# Patient Record
Sex: Male | Born: 1950 | Race: White | Hispanic: No | Marital: Single
Health system: Midwestern US, Community
[De-identification: ages and names within clinical notes are randomized; demographics above are authoritative.]

## PROBLEM LIST (undated history)

## (undated) DIAGNOSIS — K4090 Unilateral inguinal hernia, without obstruction or gangrene, not specified as recurrent: Secondary | ICD-10-CM

## (undated) DIAGNOSIS — J189 Pneumonia, unspecified organism: Secondary | ICD-10-CM

## (undated) DIAGNOSIS — C61 Malignant neoplasm of prostate: Secondary | ICD-10-CM

## (undated) DIAGNOSIS — C3431 Malignant neoplasm of lower lobe, right bronchus or lung: Secondary | ICD-10-CM

## (undated) DIAGNOSIS — I82592 Chronic embolism and thrombosis of other specified deep vein of left lower extremity: Secondary | ICD-10-CM

## (undated) DIAGNOSIS — C7951 Secondary malignant neoplasm of bone: Secondary | ICD-10-CM

## (undated) DIAGNOSIS — Z599 Problem related to housing and economic circumstances, unspecified: Secondary | ICD-10-CM

## (undated) DIAGNOSIS — C801 Malignant (primary) neoplasm, unspecified: Secondary | ICD-10-CM

## (undated) DIAGNOSIS — R160 Hepatomegaly, not elsewhere classified: Secondary | ICD-10-CM

## (undated) DIAGNOSIS — N179 Acute kidney failure, unspecified: Secondary | ICD-10-CM

---

## 1992-08-18 HISTORY — PX: ANKLE SURGERY: SHX546

## 2002-01-24 ENCOUNTER — Encounter: Payer: Self-pay | Admitting: Emergency Medicine

## 2002-01-24 ENCOUNTER — Emergency Department (HOSPITAL_COMMUNITY): Admission: EM | Admit: 2002-01-24 | Discharge: 2002-01-24 | Payer: Self-pay | Admitting: Emergency Medicine

## 2004-09-21 ENCOUNTER — Emergency Department (HOSPITAL_COMMUNITY): Admission: EM | Admit: 2004-09-21 | Discharge: 2004-09-21 | Payer: Self-pay | Admitting: Emergency Medicine

## 2004-09-30 ENCOUNTER — Emergency Department (HOSPITAL_COMMUNITY): Admission: EM | Admit: 2004-09-30 | Discharge: 2004-09-30 | Payer: Self-pay | Admitting: Family Medicine

## 2013-11-20 DIAGNOSIS — F17200 Nicotine dependence, unspecified, uncomplicated: Secondary | ICD-10-CM | POA: Insufficient documentation

## 2013-11-20 DIAGNOSIS — K4090 Unilateral inguinal hernia, without obstruction or gangrene, not specified as recurrent: Secondary | ICD-10-CM | POA: Insufficient documentation

## 2013-11-21 ENCOUNTER — Encounter (HOSPITAL_COMMUNITY): Payer: Self-pay | Admitting: Emergency Medicine

## 2013-11-21 ENCOUNTER — Emergency Department (HOSPITAL_COMMUNITY)
Admission: EM | Admit: 2013-11-21 | Discharge: 2013-11-21 | Disposition: A | Discharge status: Home / Self Care | Payer: Medicare Other | Attending: Emergency Medicine | Admitting: Emergency Medicine

## 2013-11-21 DIAGNOSIS — K4090 Unilateral inguinal hernia, without obstruction or gangrene, not specified as recurrent: Secondary | ICD-10-CM

## 2013-11-21 LAB — URINALYSIS, ROUTINE W REFLEX MICROSCOPIC
Bilirubin Urine: NEGATIVE
Glucose, UA: NEGATIVE mg/dL
Hgb urine dipstick: NEGATIVE
Ketones, ur: NEGATIVE mg/dL
Leukocytes, UA: NEGATIVE
Nitrite: NEGATIVE
Protein, ur: NEGATIVE mg/dL
Specific Gravity, Urine: 1.019 (ref 1.005–1.030)
Urobilinogen, UA: 0.2 mg/dL (ref 0.0–1.0)
pH: 5 (ref 5.0–8.0)

## 2013-11-21 NOTE — Discharge Instructions (Signed)
Call and make an appointment to followup with the surgeon this week. Return immediately for increased pain, persistent vomiting or any concerns.   Inguinal Hernia, Adult Muscles help keep everything in the body in its proper place. But if a weak spot in the muscles develops, something can poke through. That is called a hernia. When this happens in the lower part of the belly (abdomen), it is called an inguinal hernia. (It takes its name from a part of the body in this region called the inguinal canal.) A weak spot in the wall of muscles lets some fat or part of the small intestine bulge through. An inguinal hernia can develop at any age. Men get them more often than women. CAUSES  In adults, an inguinal hernia develops over time.  It can be triggered by:  Suddenly straining the muscles of the lower abdomen.  Lifting heavy objects.  Straining to have a bowel movement. Difficult bowel movements (constipation) can lead to this.  Constant coughing. This may be caused by smoking or lung disease.  Being overweight.  Being pregnant.  Working at a job that requires long periods of standing or heavy lifting.  Having had an inguinal hernia before. One type can be an emergency situation. It is called a strangulated inguinal hernia. It develops if part of the small intestine slips through the weak spot and cannot get back into the abdomen. The blood supply can be cut off. If that happens, part of the intestine may die. This situation requires emergency surgery. SYMPTOMS  Often, a small inguinal hernia has no symptoms. It is found when a healthcare provider does a physical exam. Larger hernias usually have symptoms.   In adults, symptoms may include:  A lump in the groin. This is easier to see when the person is standing. It might disappear when lying down.  In men, a lump in the scrotum.  Pain or burning in the groin. This occurs especially when lifting, straining or coughing.  A dull ache  or feeling of pressure in the groin.  Signs of a strangulated hernia can include:  A bulge in the groin that becomes very painful and tender to the touch.  A bulge that turns red or purple.  Fever, nausea and vomiting.  Inability to have a bowel movement or to pass gas. DIAGNOSIS  To decide if you have an inguinal hernia, a healthcare provider will probably do a physical examination.  This will include asking questions about any symptoms you have noticed.  The healthcare provider might feel the groin area and ask you to cough. If an inguinal hernia is felt, the healthcare provider may try to slide it back into the abdomen.  Usually no other tests are needed. TREATMENT  Treatments can vary. The size of the hernia makes a difference. Options include:  Watchful waiting. This is often suggested if the hernia is small and you have had no symptoms.  No medical procedure will be done unless symptoms develop.  You will need to watch closely for symptoms. If any occur, contact your healthcare provider right away.  Surgery. This is used if the hernia is larger or you have symptoms.  Open surgery. This is usually an outpatient procedure (you will not stay overnight in a hospital). An cut (incision) is made through the skin in the groin. The hernia is put back inside the abdomen. The weak area in the muscles is then repaired by herniorrhaphy or hernioplasty. Herniorrhaphy: in this type of surgery, the  weak muscles are sewn back together. Hernioplasty: a patch or mesh is used to close the weak area in the abdominal wall.  Laparoscopy. In this procedure, a surgeon makes small incisions. A thin tube with a tiny video camera (called a laparoscope) is put into the abdomen. The surgeon repairs the hernia with mesh by looking with the video camera and using two long instruments. HOME CARE INSTRUCTIONS   After surgery to repair an inguinal hernia:  You will need to take pain medicine prescribed by  your healthcare provider. Follow all directions carefully.  You will need to take care of the wound from the incision.  Your activity will be restricted for awhile. This will probably include no heavy lifting for several weeks. You also should not do anything too active for a few weeks. When you can return to work will depend on the type of job that you have.  During "watchful waiting" periods, you should:  Maintain a healthy weight.  Eat a diet high in fiber (fruits, vegetables and whole grains).  Drink plenty of fluids to avoid constipation. This means drinking enough water and other liquids to keep your urine clear or pale yellow.  Do not lift heavy objects.  Do not stand for long periods of time.  Quit smoking. This should keep you from developing a frequent cough. SEEK MEDICAL CARE IF:   A bulge develops in your groin area.  You feel pain, a burning sensation or pressure in the groin. This might be worse if you are lifting or straining.  You develop a fever of more than 100.5 F (38.1 C). SEEK IMMEDIATE MEDICAL CARE IF:   Pain in the groin increases suddenly.  A bulge in the groin gets bigger suddenly and does not go down.  For men, there is sudden pain in the scrotum. Or, the size of the scrotum increases.  A bulge in the groin area becomes red or purple and is painful to touch.  You have nausea or vomiting that does not go away.  You feel your heart beating much faster than normal.  You cannot have a bowel movement or pass gas.  You develop a fever of more than 102.0 F (38.9 C). Document Released: 12/21/2008 Document Revised: 10/27/2011 Document Reviewed: 12/21/2008 [facility] Patient Information 2014 [place], [place].

## 2013-11-21 NOTE — ED Provider Notes (Signed)
CSN: [id number]     Arrival date & time 11/20/13  2340 History   First MD Initiated Contact with Patient 11/21/13 [phone]     Chief Complaint  Patient presents with  . Testicle Pain     (Consider location/radiation/quality/duration/timing/severity/associated sxs/prior Treatment) HPI Patient presents with scrotal swelling for the past week. The swelling is intermittent and worse with exertion. He states he sometimes able to reduce the swelling with pressure. He states is uncomfortable but denies any testicular pain. He's had no urinary symptoms including no dysuria no hematuria and no frequency. He's had no abdominal pain and no nausea, vomiting or diarrhea. He denies any obstipation or constipation. He's had no fevers or chills.  History reviewed. No pertinent past medical history. History reviewed. No pertinent past surgical history. No family history on file. History  Substance Use Topics  . Smoking status: Current Every Day Smoker  . Smokeless tobacco: Not on file  . Alcohol Use: Yes    Review of Systems  Constitutional: Negative for fever and chills.  Respiratory: Negative for shortness of breath.   Cardiovascular: Negative for chest pain.  Gastrointestinal: Negative for nausea, vomiting, abdominal pain, diarrhea and constipation.  Genitourinary: Positive for scrotal swelling. Negative for dysuria, frequency, hematuria, discharge, penile pain and testicular pain.  Musculoskeletal: Negative for back pain.  Skin: Negative for rash and wound.  Neurological: Negative for dizziness, weakness, light-headedness and numbness.  All other systems reviewed and are negative.      Allergies  Review of patient's allergies indicates not on file.  Home Medications   Current Outpatient Rx  Name  Route  Sig  Dispense  Refill  . naproxen sodium (ANAPROX) 220 MG tablet   Oral   Take 220 mg by mouth daily as needed (pain).          BP 112/71  Pulse 84  Temp(Src) 97.8 F (36.6 C)  (Oral)  Resp 16  Ht 5\' 7"  (1.702 m)  Wt 153 lb 7 oz (69.599 kg)  BMI 24.03 kg/m2  SpO2 98% Physical Exam  Nursing note and vitals reviewed. Constitutional: He is oriented to person, place, and time. He appears well-developed and well-nourished. No distress.  Patient is very comfortable appearing resting in the bed.  HENT:  Head: Normocephalic and atraumatic.  Mouth/Throat: Oropharynx is clear and moist.  Eyes: EOM are normal. Pupils are equal, round, and reactive to light.  Neck: Normal range of motion. Neck supple.  Cardiovascular: Normal rate and regular rhythm.   Pulmonary/Chest: Effort normal and breath sounds normal. No respiratory distress. He has no wheezes. He has no rales.  Abdominal: Soft. Bowel sounds are normal. He exhibits no distension and no mass. There is no tenderness. There is no rebound and no guarding.  Genitourinary:  Large left-sided scrotal mass consistent with left inguinal hernia. Patient has no tenderness. There is no erythema or induration. Right testicle is normal but the left testicle is obscured by hernia mass. Hernia reduces with some effort.   Musculoskeletal: Normal range of motion. He exhibits no edema and no tenderness.  Neurological: He is alert and oriented to person, place, and time.  Skin: Skin is warm and dry. No rash noted. No erythema.  Psychiatric: He has a normal mood and affect. His behavior is normal.    ED Course  Procedures (including critical care time) Labs Review Labs Reviewed  URINALYSIS, ROUTINE W REFLEX MICROSCOPIC   Imaging Review No results found.   EKG Interpretation None  MDM   Final diagnoses:  None   Discussed with Dr. Janee Mornhompson. Agrees with plan to followup as an outpatient in the office. Return precautions given.     Loren Raceravid Kadejah Sandiford, MD 11/21/13 [phone]

## 2013-11-21 NOTE — ED Notes (Signed)
Pt c/o swelling in his testicles x's 3 days with pain.  Pt denies any injury or discharge

## 2013-12-11 ENCOUNTER — Encounter (HOSPITAL_COMMUNITY): Payer: Self-pay | Admitting: Emergency Medicine

## 2013-12-11 ENCOUNTER — Emergency Department (HOSPITAL_COMMUNITY)
Admission: EM | Admit: 2013-12-11 | Discharge: 2013-12-11 | Disposition: A | Discharge status: Home / Self Care | Payer: Medicare Other | Attending: Emergency Medicine | Admitting: Emergency Medicine

## 2013-12-11 DIAGNOSIS — F17200 Nicotine dependence, unspecified, uncomplicated: Secondary | ICD-10-CM | POA: Insufficient documentation

## 2013-12-11 DIAGNOSIS — L03211 Cellulitis of face: Principal | ICD-10-CM

## 2013-12-11 DIAGNOSIS — L0201 Cutaneous abscess of face: Secondary | ICD-10-CM | POA: Insufficient documentation

## 2013-12-11 MED ORDER — HYDROCODONE-ACETAMINOPHEN 5-325 MG PO TABS: 1.0000 | ORAL_TABLET | ORAL | Status: DC | PRN

## 2013-12-11 MED ORDER — CLINDAMYCIN HCL 150 MG PO CAPS: 300.0000 mg | ORAL_CAPSULE | Freq: Three times a day (TID) | ORAL | Status: DC

## 2013-12-11 MED ORDER — HYDROCODONE-ACETAMINOPHEN 5-325 MG PO TABS
1.0000 | ORAL_TABLET | Freq: Once | ORAL | Status: AC
  Administered 2013-12-11: 1 via ORAL
  Filled 2013-12-11: qty 1

## 2013-12-11 NOTE — Discharge Instructions (Signed)
Take Vicodin for severe pain - Please be careful with this medication.  It can cause drowsiness.  Use caution while driving, operating machinery, drinking alcohol, or any other activities that may impair your physical or mental abilities.   Take Ibuprofen for mild-moderate pain - 600-800 mg every 6-8 hours  Take full dose of antibiotic  Follow-up with dentist and primary doctor  Return to the emergency department if you develop any changing/worsening condition, fever, increased drainage, increased redness/swelling, or any other concerns (please read additional information regarding your condition below)     Abscess An abscess is an infected area that contains a collection of pus and debris.It can occur in almost any part of the body. An abscess is also known as a furuncle or boil. CAUSES  An abscess occurs when tissue gets infected. This can occur from blockage of oil or sweat glands, infection of hair follicles, or a minor injury to the skin. As the body tries to fight the infection, pus collects in the area and creates pressure under the skin. This pressure causes pain. People with weakened immune systems have difficulty fighting infections and get certain abscesses more often.  SYMPTOMS Usually an abscess develops on the skin and becomes a painful mass that is red, warm, and tender. If the abscess forms under the skin, you may feel a moveable soft area under the skin. Some abscesses break open (rupture) on their own, but most will continue to get worse without care. The infection can spread deeper into the body and eventually into the bloodstream, causing you to feel ill.  DIAGNOSIS  Your caregiver will take your medical history and perform a physical exam. A sample of fluid may also be taken from the abscess to determine what is causing your infection. TREATMENT  Your caregiver may prescribe antibiotic medicines to fight the infection. However, taking antibiotics alone usually does not cure an  abscess. Your caregiver may need to make a small cut (incision) in the abscess to drain the pus. In some cases, gauze is packed into the abscess to reduce pain and to continue draining the area. HOME CARE INSTRUCTIONS   Only take over-the-counter or prescription medicines for pain, discomfort, or fever as directed by your caregiver.  If you were prescribed antibiotics, take them as directed. Finish them even if you start to feel better.  If gauze is used, follow your caregiver's directions for changing the gauze.  To avoid spreading the infection:  Keep your draining abscess covered with a bandage.  Wash your hands well.  Do not share personal care items, towels, or whirlpools with others.  Avoid skin contact with others.  Keep your skin and clothes clean around the abscess.  Keep all follow-up appointments as directed by your caregiver. SEEK MEDICAL CARE IF:   You have increased pain, swelling, redness, fluid drainage, or bleeding.  You have muscle aches, chills, or a general ill feeling.  You have a fever. MAKE SURE YOU:   Understand these instructions.  Will watch your condition.  Will get help right away if you are not doing well or get worse. Document Released: 05/14/2005 Document Revised: 02/03/2012 Document Reviewed: 10/17/2011 [facility] Patient Information 2014 [place], [place].  Dental Caries  Dental caries (also called tooth decay) is the most common oral disease. It can occur at any age, but is more common in children and young adults.  HOW DENTAL CARIES DEVELOPS  The process of decay begins when bacteria and foods (particularly sugars and starches) combine in  your mouth to produce plaque. Plaque is a substance that sticks to the hard, outer surface of a tooth (enamel). The bacteria in plaque produce acids that attack enamel. These acids may also attack the root surface of a tooth (cementum) if it is exposed. Repeated attacks dissolve these surfaces and create holes  in the tooth (cavities). If left untreated, the acids destroy the other layers of the tooth.  RISK FACTORS  Frequent sipping of sugary beverages.   Frequent snacking on sugary and starchy foods, especially those that easily get stuck in the teeth.   Poor oral hygiene.   Dry mouth.   Substance abuse such as methamphetamine abuse.   Broken or poor-fitting dental restorations.   Eating disorders.   Gastroesophageal reflux disease (GERD).   Certain radiation treatments to the head and neck. SYMPTOMS In the early stages of dental caries, symptoms are seldom present. Sometimes white, chalky areas may be seen on the enamel or other tooth layers. In later stages, symptoms may include:  Pits and holes on the enamel.  Toothache after sweet, hot, or cold foods or drinks are consumed.  Pain around the tooth.  Swelling around the tooth. DIAGNOSIS  Most of the time, dental caries is detected during a regular dental checkup. A diagnosis is made after a thorough medical and dental history is taken and the surfaces of your teeth are checked for signs of dental caries. Sometimes special instruments, such as lasers, are used to check for dental caries. Dental X-ray exams may be taken so that areas not visible to the eye (such as between the contact areas of the teeth) can be checked for cavities.  TREATMENT  If dental caries is in its early stages, it may be reversed with a fluoride treatment or an application of a remineralizing agent at the dental office. Thorough brushing and flossing at home is needed to aid these treatments. If it is in its later stages, treatment depends on the location and extent of tooth destruction:   If a small area of the tooth has been destroyed, the destroyed area will be removed and cavities will be filled with a material such as gold, silver amalgam, or composite resin.   If a large area of the tooth has been destroyed, the destroyed area will be removed and  a cap (crown) will be fitted over the remaining tooth structure.   If the center part of the tooth (pulp) is affected, a procedure called a root canal will be needed before a filling or crown can be placed.   If most of the tooth has been destroyed, the tooth may need to be pulled (extracted). HOME CARE INSTRUCTIONS You can prevent, stop, or reverse dental caries at home by practicing good oral hygiene. Good oral hygiene includes:  Thoroughly cleaning your teeth at least twice a day with a toothbrush and dental floss.   Using a fluoride toothpaste. A fluoride mouth rinse may also be used if recommended by your dentist or health care provider.   Restricting the amount of sugary and starchy foods and sugary liquids you consume.   Avoiding frequent snacking on these foods and sipping of these liquids.   Keeping regular visits with a dentist for checkups and cleanings. PREVENTION   Practice good oral hygiene.  Consider a dental sealant. A dental sealant is a coating material that is applied by your dentist to the pits and grooves of teeth. The sealant prevents food from being trapped in them. It may protect  the teeth for several years.  Ask about fluoride supplements if you live in a community without fluorinated water or with water that has a low fluoride content. Use fluoride supplements as directed by your dentist or health care provider.  Allow fluoride varnish applications to teeth if directed by your dentist or health care provider. Document Released: 04/26/2002 Document Revised: 04/06/2013 Document Reviewed: 08/06/2012 [facility] Patient Information 2014 [place]Care, [place]LL[place].   Emergency Department Resource Guide 1) Find a Doctor and Pay Out of Pocket Although you won't have to find out who is covered by your insurance plan, it is a good idea to ask around and get recommendations. You will then need to call the office and see if the doctor you have chosen will accept you as a new  patient and what types of options they offer for patients who are self-pay. Some doctors offer discounts or will set up payment plans for their patients who do not have insurance, but you will need to ask so you aren't surprised when you get to your appointment.  2) Contact Your Local Health Department Not all health departments have doctors that can see patients for sick visits, but many do, so it is worth a call to see if yours does. If you don't know where your local health department is, you can check in your phone book. The CDC also has a tool to help you locate your state's health department, and many state websites also have listings of all of their local health departments.  3) Find a Walk-in Clinic If your illness is not likely to be very severe or complicated, you may want to try a walk in clinic. These are popping up all over the country in pharmacies, drugstores, and shopping centers. They're usually staffed by nurse practitioners or physician assistants that have been trained to treat common illnesses and complaints. They're usually fairly quick and inexpensive. However, if you have serious medical issues or chronic medical problems, these are probably not your best option.  No Primary Care Doctor: - Call Health Connect at  [phone] - they can help you locate a primary care doctor that  accepts your insurance, provides certain services, etc. - Physician Referral Service- [phone]  Chronic Pain Problems: Organization         Address  Phone   Notes  [facility]  [phone] Patients need to be referred by their primary care doctor.   Medication Assistance: Organization         Address  Phone   Notes  [facility] [address] [place], [place] [address] [phone] --Must be a resident of [facility] [place] [facility] -- Must have NO insurance coverage whatsoever (no Medicaid/ Medicare, etc.) -- The pt. MUST have a primary  care doctor that directs their care regularly and follows them in the community   MedAssist  [phone]   [employer]  [phone]    Agencies that provide inexpensive medical care: Organization         Address  Phone   Notes  [facility]  [phone]   [facility]    [phone]   [facility] [address] [place], [place] [address] [phone]   [facility] [address], [place] [phone]   Planned Parenthood    [phone]   [facility]    [phone]) [phone]   [facility]  [address], [place] Phone:  [phone], Fax:  [phone] Hours of Operation:  9 am - 6 pm, M-F.  Also accepts Medicaid/Medicare and self-pay.  [facility]  [address], [place] Phone: [phone], Fax: [phone]. Hours of Operation:  8:30 am - 5:30 pm, M-F.  Also accepts Medicaid and self-pay.  [facility] [place] [address] [place] Phone: [phone]   [facility] [address] Natasha Bence [place], [place] [phone], Ext. [phone] Mondays & Thursdays: 7-9 AM.  First 15 patients are seen on a first come, first serve basis.    Medicaid-accepting [facility], [facility] Providers:  Organization         Address  Phone   Notes  [facility] [address], [place] [phone] Also accepts self-pay patients.  [facility] [address] Laurell Josephs [place], [place]  [phone]   [facility] [address], [place] [phone]   [facility] [address] [place] [phone]   Renaye Rakers [address], [place]   [phone] Only accepts [place] Access [place] patients after they have their name applied to their card.   Self-Pay (no insurance) in [facility]:  Organization         Address  Phone   Notes  Sickle Cell Patients, [facility] [address] [place], [place] [phone]   [facility] [address] [place], [place] [phone]   [facility] [place]  [address],  [phone]   [facility]/Dr. Osei-Bonsu  [address] [place] or [address], [place] [phone] Phone number for both [place] and [place] locations is the same.  [facility] [address] [place] [address] [place] [phone]   [facility] [address], [place] or [address] [phone]   [facility] [address], [place] [phone], phone; [phone], fax Sees patients 1st and 3rd Saturday of every month.  Must not qualify for public or private insurance (i.e. Medicaid, Medicare, [place] Health Choice, Veterans' Benefits)  Household income should be no more than 200% of the poverty level The clinic cannot treat you if you are pregnant or think you are pregnant  Sexually transmitted diseases are not treated at the clinic.    Dental Care: Organization         Address  Phone  Notes  [facility] Department of [facility] [address] [place], [place] [phone] Accepts children up to age 65 who are enrolled in [place] or [place] Health Choice; pregnant women with a Medicaid card; and children who have applied for Medicaid or [place] Health Choice, but were declined, whose parents can pay a reduced fee at time of service.  [facility] Department of [facility]  [address], [place] [phone] Accepts children up to age 29 who are enrolled in [place] or [place] Health Choice; pregnant women with a Medicaid card; and children who have applied for Medicaid or [place] Health Choice, but were declined, whose parents can  pay a reduced fee at time of service.  [facility]  [address]  Lynne [place] [phone] Patients are seen by appointment only. Walk-ins are not accepted. [facility] will see patients 19 years of age and older. Monday - Tuesday (8am-5pm) Most Wednesdays (8:30-5pm) $30 per visit, cash only  [facility] Adult Dental Access PROGRAM  [address], [facility] [phone] Patients are seen by appointment only. Walk-ins are not accepted. [facility] will see patients 17 years of age and older. One Wednesday Evening (Monthly: Volunteer Based).  $30 per visit, cash only  [employer]  [phone] for adults; Children under age 34, call Graduate Pediatric Dentistry at [phone]. Children aged 79-14, please call [phone] to request a pediatric application.  Dental services are provided in all areas of dental care including fillings, crowns and bridges, complete and partial dentures, implants, gum treatment, root canals, and extractions. Preventive care is also provided. Treatment is provided to both adults and children. Patients are selected via a lottery and there is often a waiting list.   [facility] [address] [place]  [phone] [email or web address]   [facility] [address] [place], [place] [phone], Ext. [phone] Second and Fourth Thursday of each month, opens at 6:30 AM; Clinic ends at 9 AM.  Patients are seen on a first-come first-served basis, and a limited number are seen during each clinic.   [facility]  [address] Ether Griffins [employer], [place] [phone]   Eligibility Requirements You must have lived in [place], [place], or [place] counties for at least the last three months.   You cannot be eligible for state or federal sponsored [place], including [employer], [place], or [employer].   You generally cannot be eligible for healthcare  insurance through your employer.    How to apply: Eligibility screenings are held every Tuesday and Wednesday afternoon from 1:00 pm until 4:00 pm. You do not need an appointment for the interview!  [facility] [address], [place], [place] [phone]   [facility] Health Department  [phone]   [facility] Health Department  [phone]   [facility] Health Department  [phone]    Behavioral Health Resources in the Community: Intensive Outpatient Programs Organization         Address  Phone  Notes  [facility] Services [address], [place], [place] [phone]   [facility] Outpatient [address], [place], [place] [phone]   [facility] [address], [place], [place]  [phone]   [facility] Mental Health [address]  [place], [place] [phone] or [phone]   Substance Abuse Resources Organization         Address  Phone  Notes  [facility] Drug Services  9343523065   [facility]  [phone]   [facility] [place]  [phone]   Floydene Flock  [phone]   Residential & Outpatient Substance Abuse Program  [phone]   Psychological Services Organization         Address  Phone  Notes  [facility] Behavioral Health  [phone]   [facility] Services  [phone]   [facility] Mental Health [address], [place] [phone] or [phone]    Mobile Crisis Teams Organization         Address  Phone  Notes  [facility], Mobile Crisis Care Unit  [phone]   [facility]  [address] [place], [place] [phone]   Doristine Locks [phone]  [address] [place] [phone]    Self-Help/Support Groups Organization         Address  Phone             Notes  [facility] of [place] - variety of support groups  336- I7437963 Call for more information  Narcotics Anonymous (NA),  [facility] [address], [employer] [place]  2 meetings at this location   Statistician         Address  Phone  Notes  [facility] [address],    [place]  [phone]   [facility]  [address], [place] [address], [place], [place] [phone]   [facility] [address] [place], [place] [phone] Admissions: 8am-3pm M-F  [facility] [address]    [place], [place] [phone]   [facility] [address] [place], [place], [place] [phone]   [facility] [address]  [place], [place] [phone]   [facility] - Intensive Outpatient [address] Laurell Josephs [address], [place], [place] [phone]   [facility] ([facility]iction [facility] [address] [place]  [place], [place] [phone] or [phone]   [facility] ([facility]) [address], [place], [place] [phone] Accepts Medicaid  [facility] [place] [address],  [place] [phone] Substance Abuse/Addiction Treatment   [facility] Organization         Address  Phone  Notes  [facility]  [phone]   Angie Fava, PhD [address] Ervin Knack [place], [place]   [phone] or [phone]   [facility] Behavioral   [address] [place], [place] [phone]   [facility] [address], [place], [place] [phone] Insurance/Medicaid/sponsorship through [facility] and Families [address]                                    [place], [place] [phone] Therapy/tele-psych/case  [facility] [address], [place] [phone]    Dr. Lolly Mustache  [phone]   [facility]  [employer] [facility] Dept. 1) [address] [place] 2) [address], [place] 3)  [address], [place] [phone]  [phone]    [facility] Child Abuse Hotline (340)502-8673 or [phone] (After Hours)

## 2013-12-11 NOTE — ED Provider Notes (Signed)
CSN: [id number]     Arrival date & time 12/11/13  0917 History  This chart was scribed for non-physician practitioner, Larry CeoJessica Aariyana Manz, PA-C working with Larry Lyonsouglas Delo, MD by Larry Meadows, ED scribe. This patient was seen in room TR08C/TR08C and the patient's care was started at 10:28 AM.   Chief Complaint  Patient presents with  . Abscess   The history is provided by the patient. No language interpreter was used.   HPI Comments: Larry Meadows is a 63 y.o. male who presents to the Emergency Department complaining of an abscess to the left side of his face that started 9 days ago. No wounds or trauma. He states it has gotten larger within the last week and now has constant throbbing pain. Pt has tried epsom salt, salt and ice paste and ice packs with no relief. He states there has been very little purulent drainage. Denies fever, dental pain. Pt has had an abscess several years ago, but cannot remember where. Denies history of diabetes.    History reviewed. No pertinent past medical history. Past Surgical History  Procedure Laterality Date  . Ankle surgery Right 1994   No family history on file. History  Substance Use Topics  . Smoking status: Current Every Day Smoker  . Smokeless tobacco: Not on file  . Alcohol Use: Yes    Review of Systems  Constitutional: Negative for fever, chills, diaphoresis, activity change, appetite change and fatigue.  HENT: Positive for facial swelling. Negative for dental problem, ear pain, rhinorrhea, sore throat and trouble swallowing.   Gastrointestinal: Negative for nausea, vomiting and abdominal pain.  Musculoskeletal: Negative for arthralgias and myalgias.  Skin: Positive for color change. Negative for rash and wound.       Abscess.  Neurological: Negative for weakness and headaches.  All other systems reviewed and are negative.  Allergies  Review of patient's allergies indicates no known allergies.  Home Medications   Prior to Admission  medications   Medication Sig Start Date End Date Taking? Authorizing Provider  naproxen sodium (ANAPROX) 220 MG tablet Take 220 mg by mouth daily as needed (pain).    Historical Provider, MD   BP 127/76  Pulse 85  Temp(Src) 98.2 F (36.8 C) (Oral)  Resp 18  Ht 5\' 7"  (1.702 m)  Wt 145 lb (65.772 kg)  BMI 22.71 kg/m2  SpO2 100%  Filed Vitals:   12/11/13 0923 12/11/13 0925  BP:  127/76  Pulse:  85  Temp:  98.2 F (36.8 C)  TempSrc:  Oral  Resp:  18  Height: 5\' 7"  (1.702 m)   Weight: 145 lb (65.772 kg)   SpO2:  100%    Physical Exam  Nursing note and vitals reviewed. Constitutional: He is oriented to person, place, and time. He appears well-developed and well-nourished. No distress.  Non-toxic  HENT:  Head: Normocephalic and atraumatic.    Right Ear: External ear normal.  Left Ear: External ear normal.  Nose: Nose normal.  Mouth/Throat: Oropharynx is clear and moist.  2 cm x 2 cm superficial fluctuant abscess to the left lower mandible with overlying erythema. No drainage. Mass not palpated in the lower gingival tissue. Poor dentition throughout. No erythema to the posterior pharynx. Tonsils without edema or exudates. Uvula midline. No trismus. No difficulty controlling secretions. Tympanic membranes gray and translucent bilaterally with no erythema, edema, or hemotympanum.  No mastoid or tragal tenderness bilaterally.   Eyes: Conjunctivae and EOM are normal. Right eye exhibits no discharge. Left eye  exhibits no discharge.  Neck: Normal range of motion. Neck supple. No tracheal deviation present.  No cervical lymphadenopathy. No nuchal rigidity. No submental fullness.  Cardiovascular: Normal rate, regular rhythm and normal heart sounds.  Exam reveals no gallop and no friction rub.   No murmur heard. Pulmonary/Chest: Effort normal and breath sounds normal. No respiratory distress. He has no wheezes. He has no rales. He exhibits no tenderness.  Musculoskeletal: Normal range of  motion.  Neurological: He is alert and oriented to person, place, and time.  Skin: Skin is warm and dry. He is not diaphoretic.  Psychiatric: He has a normal mood and affect. His behavior is normal.    ED Course  Procedures (including critical care time)  DIAGNOSTIC STUDIES: Oxygen Saturation is 100% on RA, normal by my interpretation.    COORDINATION OF CARE: 10:29 AM-Discussed treatment plan which includes I&D and an antibiotic with pt at bedside and pt agreed to plan.   INCISION AND DRAINAGE Performed by: Larry CeoJessica Landon Bassford, PA-C Consent: Verbal consent obtained. Risks and benefits: risks, benefits and alternatives were discussed Type: abscess  Body area: left face  Anesthesia: local infiltration  Incision was made with a scalpel.  Local anesthetic: lidocaine 2% without epinephrine  Anesthetic total: 4 ml  Complexity: complex Blunt dissection to break up loculations  Drainage: purulent  Drainage amount: copious  Packing material: 1/4 iodoform gauze  Patient tolerance: Patient tolerated the procedure well with no immediate complications.  Labs Review Labs Reviewed - No data to display  Imaging Review No results found.   EKG Interpretation None      MDM   Larry Meadows is a 63 y.o. male who presents to the Emergency Department complaining of an abscess to the left side of his face that started 9 days ago. Abscess drained in the ED. Patient afebrile and non-toxic in appearance. No evidence of a dental abscess, however, patient has poor dentition throughout. Given resources for dental follow-up. Wound packed. Instructed patient to follow-up for packing removal in 2 days. Will give antibiotics and pain medication. Return precautions, discharge instructions, and follow-up was discussed with the patient before discharge.     Discharge Medication List as of 12/11/2013 10:49 AM    START taking these medications   Details  clindamycin (CLEOCIN) 150 MG capsule Take  2 capsules (300 mg total) by mouth 3 (three) times daily. May dispense as 150mg  capsules, Starting 12/11/2013, Until Discontinued, Print    HYDROcodone-acetaminophen (NORCO/VICODIN) 5-325 MG per tablet Take 1 tablet by mouth every 4 (four) hours as needed., Starting 12/11/2013, Until Discontinued, Print        Final impressions: 1. Abscess of face      Luiz IronJessica Katlin Britley Gashi PA-C   I personally performed the services described in this documentation, which was scribed in my presence. The recorded information has been reviewed and is accurate.  Jillyn LedgerJessica K Jaelin Fackler, PA-C 12/11/13 2116

## 2013-12-11 NOTE — ED Notes (Signed)
Pt c/o abscess to left side of face onset 12/02/2013. Pt has tried epsom salt, salt and ice paste to face and ice packs with no relief.

## 2013-12-13 ENCOUNTER — Emergency Department (HOSPITAL_COMMUNITY)
Admission: EM | Admit: 2013-12-13 | Discharge: 2013-12-13 | Discharge status: Against Medical Advice | Payer: Medicare Other

## 2013-12-13 NOTE — ED Provider Notes (Signed)
Medical screening examination/treatment/procedure(s) were performed by non-physician practitioner and as supervising physician I was immediately available for consultation/collaboration.     Dustie Brittle, MD 12/13/13 1408 

## 2013-12-23 ENCOUNTER — Ambulatory Visit (INDEPENDENT_AMBULATORY_CARE_PROVIDER_SITE_OTHER): Payer: Self-pay | Admitting: Surgery

## 2013-12-29 ENCOUNTER — Encounter (INDEPENDENT_AMBULATORY_CARE_PROVIDER_SITE_OTHER): Payer: Self-pay | Admitting: Surgery

## 2014-02-15 ENCOUNTER — Emergency Department (HOSPITAL_COMMUNITY)
Admission: EM | Admit: 2014-02-15 | Discharge: 2014-02-15 | Disposition: A | Discharge status: Home / Self Care | Payer: Medicare Other | Attending: Emergency Medicine | Admitting: Emergency Medicine

## 2014-02-15 ENCOUNTER — Encounter (HOSPITAL_COMMUNITY): Payer: Self-pay | Admitting: Emergency Medicine

## 2014-02-15 ENCOUNTER — Emergency Department (HOSPITAL_COMMUNITY): Payer: Medicare Other

## 2014-02-15 DIAGNOSIS — K4090 Unilateral inguinal hernia, without obstruction or gangrene, not specified as recurrent: Secondary | ICD-10-CM

## 2014-02-15 DIAGNOSIS — F17200 Nicotine dependence, unspecified, uncomplicated: Secondary | ICD-10-CM | POA: Insufficient documentation

## 2014-02-15 DIAGNOSIS — Z792 Long term (current) use of antibiotics: Secondary | ICD-10-CM | POA: Insufficient documentation

## 2014-02-15 LAB — CBC WITH DIFFERENTIAL/PLATELET
Basophils Absolute: 0 10*3/uL (ref 0.0–0.1)
Basophils Relative: 0 % (ref 0–1)
Eosinophils Absolute: 0 10*3/uL (ref 0.0–0.7)
Eosinophils Relative: 0 % (ref 0–5)
HCT: 46.1 % (ref 39.0–52.0)
Hemoglobin: 16.3 g/dL (ref 13.0–17.0)
Lymphocytes Relative: 6 % — ABNORMAL LOW (ref 12–46)
Lymphs Abs: 0.6 10*3/uL — ABNORMAL LOW (ref 0.7–4.0)
MCH: 32.7 pg (ref 26.0–34.0)
MCHC: 35.4 g/dL (ref 30.0–36.0)
MCV: 92.6 fL (ref 78.0–100.0)
Monocytes Absolute: 0.4 10*3/uL (ref 0.1–1.0)
Monocytes Relative: 4 % (ref 3–12)
Neutro Abs: 9.3 10*3/uL — ABNORMAL HIGH (ref 1.7–7.7)
Neutrophils Relative %: 90 % — ABNORMAL HIGH (ref 43–77)
Platelets: 178 10*3/uL (ref 150–400)
RBC: 4.98 MIL/uL (ref 4.22–5.81)
RDW: 13.6 % (ref 11.5–15.5)
WBC: 10.3 10*3/uL (ref 4.0–10.5)

## 2014-02-15 LAB — BASIC METABOLIC PANEL
Anion gap: 17 — ABNORMAL HIGH (ref 5–15)
BUN: 9 mg/dL (ref 6–23)
CO2: 23 mEq/L (ref 19–32)
Calcium: 10.7 mg/dL — ABNORMAL HIGH (ref 8.4–10.5)
Chloride: 100 mEq/L (ref 96–112)
Creatinine, Ser: 0.61 mg/dL (ref 0.50–1.35)
GFR calc Af Amer: >90 mL/min (ref >90)
GFR calc non Af Amer: >90 mL/min (ref >90)
Glucose, Bld: 101 mg/dL — ABNORMAL HIGH (ref 70–99)
Potassium: 4.2 mEq/L (ref 3.7–5.3)
Sodium: 140 mEq/L (ref 137–147)

## 2014-02-15 LAB — URINALYSIS, ROUTINE W REFLEX MICROSCOPIC
Glucose, UA: NEGATIVE mg/dL
Hgb urine dipstick: NEGATIVE
Ketones, ur: 15 mg/dL — AB
Leukocytes, UA: NEGATIVE
Nitrite: NEGATIVE
Protein, ur: NEGATIVE mg/dL
Specific Gravity, Urine: 1.027 (ref 1.005–1.030)
Urobilinogen, UA: 1 mg/dL (ref 0.0–1.0)
pH: 5 (ref 5.0–8.0)

## 2014-02-15 MED ORDER — HYDROMORPHONE HCL PF 1 MG/ML IJ SOLN
1.0000 mg | Freq: Once | INTRAMUSCULAR | Status: AC
  Administered 2014-02-15: 1 mg via INTRAVENOUS
  Filled 2014-02-15: qty 1

## 2014-02-15 NOTE — ED Notes (Signed)
Pt c/o left testicle pain and swelling x 2 weeks that became worse today

## 2014-02-15 NOTE — Discharge Instructions (Signed)

## 2014-02-15 NOTE — ED Notes (Signed)
Pt aware that urine is needed. Unable to urinate at this time.  

## 2014-02-15 NOTE — ED Notes (Signed)
Pt aware that urine is need. Still unable to urinate at this time.

## 2014-02-15 NOTE — ED Notes (Signed)
Pt remains in US at this time.

## 2014-02-15 NOTE — ED Provider Notes (Signed)
CSN: [id number]     Arrival date & time 02/15/14  1155 History   First MD Initiated Contact with Patient 02/15/14 1415     Chief Complaint  Patient presents with  . Testicle Pain     (Consider location/radiation/quality/duration/timing/severity/associated sxs/prior Treatment) HPI Pt is a 63yo male with hx of left inguinal hernia dx in ED in April 2015 presenting to ED c/o left inguinal masses with testicular pain that has been intermittent for about 1 month but states pain worsened last week after lifting piles of wood.  Today, pt states he noticed a large mass in his left testicle after pt was outside raking leaves.  Pt reports having a small mass last week but states the mass today is a lot larger.  Pt states pain was so bad at one point it nearly brought him to tears, aching and throbbing, worse with ambulation and palpation. No pain medication PTA.  Pt reports mild dysuria. Denies penile pain, swelling, or discharge.  Denies fever, n/v/d. States he has not f/u with general surgery yet for his hernia.    History reviewed. No pertinent past medical history. Past Surgical History  Procedure Laterality Date  . Ankle surgery Right 1994   History reviewed. No pertinent family history. History  Substance Use Topics  . Smoking status: Current Every Day Smoker  . Smokeless tobacco: Not on file  . Alcohol Use: Yes    Review of Systems  Constitutional: Negative for fever and chills.  Respiratory: Negative for cough and shortness of breath.   Cardiovascular: Negative for chest pain, palpitations and leg swelling.  Gastrointestinal: Negative for nausea, vomiting, abdominal pain and diarrhea.  Genitourinary: Positive for dysuria, scrotal swelling and testicular pain. Negative for urgency, frequency, hematuria, discharge, penile swelling and penile pain.       Left scrotal mass. Left inguinal mass  All other systems reviewed and are negative.     Allergies  Review of patient's allergies  indicates no known allergies.  Home Medications   Prior to Admission medications   Medication Sig Start Date End Date Taking? Authorizing Provider  clindamycin (CLEOCIN) 150 MG capsule Take 300 mg by mouth 3 (three) times daily. May dispense as 150mg  capsules. Completed course of medication two months ago from today. 02-15-14 12/11/13   Janene Harvey Palmer, PA-C   BP 103/44  Pulse 84  Temp(Src) 97.6 F (36.4 C) (Oral)  Resp 14  SpO2 100% Physical Exam  Nursing note and vitals reviewed. Constitutional: He appears well-developed and well-nourished.  Pt walked from wheelchair to exam bed, NAD.   HENT:  Head: Normocephalic and atraumatic.  Eyes: Conjunctivae are normal. No scleral icterus.  Neck: Normal range of motion.  Cardiovascular: Normal rate, regular rhythm and normal heart sounds.   Pulmonary/Chest: Effort normal and breath sounds normal. No respiratory distress. He has no wheezes. He has no rales. He exhibits no tenderness.  Abdominal: Soft. Bowel sounds are normal. He exhibits mass ( left inguinal region). He exhibits no distension. There is tenderness ( left groin and suprapubic region). There is no rebound and no guarding. A hernia is present. Hernia confirmed positive in the left inguinal area.  Soft, non-distended, non-tender. Left inguinal hernia. Not reducible.   Genitourinary:  Chaperoned exam.  Large hard mass in left groin and testicle. Tender to palpation. No erythema.  No fluctuance.  No penile pain, swelling, erythema or discharge.   Musculoskeletal: Normal range of motion.  Neurological: He is alert.  Skin: Skin is warm and  dry.    ED Course  Procedures (including critical care time) Labs Review Labs Reviewed  URINALYSIS, ROUTINE W REFLEX MICROSCOPIC - Abnormal; Notable for the following:    Color, Urine AMBER (*)    APPearance HAZY (*)    Bilirubin Urine SMALL (*)    Ketones, ur 15 (*)    All other components within normal limits  CBC WITH DIFFERENTIAL -  Abnormal; Notable for the following:    Neutrophils Relative % 90 (*)    Neutro Abs 9.3 (*)    Lymphocytes Relative 6 (*)    Lymphs Abs 0.6 (*)    All other components within normal limits  BASIC METABOLIC PANEL - Abnormal; Notable for the following:    Glucose, Bld 101 (*)    Calcium 10.7 (*)    Anion gap 17 (*)    All other components within normal limits    Imaging Review [place] Scrotum  02/15/2014   CLINICAL DATA:  Left testicular pain and swelling.  EXAM: SCROTAL ULTRASOUND  DOPPLER ULTRASOUND OF THE TESTICLES  TECHNIQUE: Complete ultrasound examination of the testicles, epididymis, and other scrotal structures was performed. Color and spectral Doppler ultrasound were also utilized to evaluate blood flow to the testicles.  COMPARISON:  None.  FINDINGS: Right testicle  Measurements: 3.8 x 2.5 x 2.7 cm. No mass or microlithiasis visualized.  Left testicle  Measurements: 3.9 x 2.7 x 3.3 cm. No mass or microlithiasis visualized.  Right epididymis: Several spermatoceles or cysts in the right epididymal head, largest measuring 1.4 cm.  Left epididymis:  Normal in size and appearance.  Hydrocele:  None visualized.  Varicocele:  None visualized.  Pulsed Doppler interrogation of both testes demonstrates low resistance arterial and venous waveforms bilaterally.  Other: A large hernia containing bowel and fat is seen in the left hemiscrotum adjacent to the testicle.  IMPRESSION: No evidence of testicular mass or torsion.  Large bowel containing hernia within the left hemiscrotum.   Electronically Signed   By: Myles RosenthalJohn  Stahl M.D.   On: 02/15/2014 14:30   [place] Art/ven Flow Abd Pelv Doppler  02/15/2014   CLINICAL DATA:  Left testicular pain and swelling.  EXAM: SCROTAL ULTRASOUND  DOPPLER ULTRASOUND OF THE TESTICLES  TECHNIQUE: Complete ultrasound examination of the testicles, epididymis, and other scrotal structures was performed. Color and spectral Doppler ultrasound were also utilized to evaluate blood flow to the  testicles.  COMPARISON:  None.  FINDINGS: Right testicle  Measurements: 3.8 x 2.5 x 2.7 cm. No mass or microlithiasis visualized.  Left testicle  Measurements: 3.9 x 2.7 x 3.3 cm. No mass or microlithiasis visualized.  Right epididymis: Several spermatoceles or cysts in the right epididymal head, largest measuring 1.4 cm.  Left epididymis:  Normal in size and appearance.  Hydrocele:  None visualized.  Varicocele:  None visualized.  Pulsed Doppler interrogation of both testes demonstrates low resistance arterial and venous waveforms bilaterally.  Other: A large hernia containing bowel and fat is seen in the left hemiscrotum adjacent to the testicle.  IMPRESSION: No evidence of testicular mass or torsion.  Large bowel containing hernia within the left hemiscrotum.   Electronically Signed   By: Myles RosenthalJohn  Stahl M.D.   On: 02/15/2014 14:30     EKG Interpretation None      MDM   Final diagnoses:  Left inguinal hernia    Pt is a 63yo male with known left inguinal hernia presenting to ED with worsening left scrotal pain and large inguinal mass that worsened  last week after lifting piles of wood, then worsened even more today after pt was working in the yard.  Pain was severe in nature at first but improved after coming to ED and lying down.  Scrotal US: significant for large bowel containing hernia within left hemiscrotum.  No evidence of testicular mass or torsion.    Discussed pt with Dr. Jeraldine LootsLockwood. Pt was given 1mg  dilaudid, ice applied to region and placed in reverse trendelenburg position. Pt states mass did go down slightly.  Reduction of hernia attempted w/o success. Dr. Jeraldine LootsLockwood consulted with general surgery who will come assess pt.  No evidence of strangulation of hernia at this time.  Pt is in NAD.  Resting comfortably.    6:06 PM Pt was evaluated by Dr. Corliss Skainssuei, hernia has been reduced in ED with application of ice to area and reverse trendelenburg position.  Pt may be discharged home. Return  precautions provided. Pt advised to f/u with general surgery for definitive tx of inguinal hernia.  Pt verbalized understanding and agreement with tx plan.      Junius Finnerrin O'Malley, PA-C 02/15/14 [id number]

## 2014-02-17 NOTE — ED Provider Notes (Signed)
  This was a shared visit with a mid-level provided (NP or PA).  Throughout the patient's course I was available for consultation/collaboration.  I saw the ECG (if appropriate), relevant labs and studies - I agree with the interpretation.  On my exam the patient was in no distress.He had a palpable mass in the left scrotum. I had some resolution of his hernia with direct pressure, and after placing him in reverse Trendelenburg position, with ice packs, hernia resolved. After discussed this case with our general surgeon on call, he evaluated the patient as well, and the patient was discharged in stable condition to follow up in the office.         Gerhard Munchobert Elva Mauro, MD 02/17/14 [id number]

## 2014-11-04 ENCOUNTER — Emergency Department (HOSPITAL_COMMUNITY)
Admission: EM | Admit: 2014-11-04 | Discharge: 2014-11-04 | Disposition: A | Discharge status: Home / Self Care | Payer: Medicare Other | Attending: Emergency Medicine | Admitting: Emergency Medicine

## 2014-11-04 ENCOUNTER — Emergency Department (HOSPITAL_COMMUNITY): Payer: Medicare Other

## 2014-11-04 ENCOUNTER — Encounter (HOSPITAL_COMMUNITY): Payer: Self-pay | Admitting: Emergency Medicine

## 2014-11-04 DIAGNOSIS — Z720 Tobacco use: Secondary | ICD-10-CM | POA: Insufficient documentation

## 2014-11-04 DIAGNOSIS — X58XXXA Exposure to other specified factors, initial encounter: Secondary | ICD-10-CM | POA: Insufficient documentation

## 2014-11-04 DIAGNOSIS — Y9289 Other specified places as the place of occurrence of the external cause: Secondary | ICD-10-CM | POA: Insufficient documentation

## 2014-11-04 DIAGNOSIS — Y998 Other external cause status: Secondary | ICD-10-CM | POA: Insufficient documentation

## 2014-11-04 DIAGNOSIS — S2231XA Fracture of one rib, right side, initial encounter for closed fracture: Secondary | ICD-10-CM

## 2014-11-04 DIAGNOSIS — Y9389 Activity, other specified: Secondary | ICD-10-CM | POA: Insufficient documentation

## 2014-11-04 MED ORDER — OXYCODONE-ACETAMINOPHEN 5-325 MG PO TABS
1.0000 | ORAL_TABLET | Freq: Once | ORAL | Status: AC
  Administered 2014-11-04: 1 via ORAL
  Filled 2014-11-04: qty 1

## 2014-11-04 MED ORDER — OXYCODONE-ACETAMINOPHEN 5-325 MG PO TABS: 1.0000 | ORAL_TABLET | ORAL | Status: DC | PRN

## 2014-11-04 NOTE — ED Notes (Signed)
Woke up with R sided rib pain this morning.  Pain worse when coughing.  Reports non-productive cough x 2 days.  Denies any other symptoms.

## 2014-11-04 NOTE — Discharge Instructions (Signed)

## 2014-11-04 NOTE — ED Provider Notes (Addendum)
CSN: [id number]     Arrival date & time 11/04/14  0605 History   First MD Initiated Contact with Patient 11/04/14 [phone]     Chief Complaint  Patient presents with  . rib pain      (Consider location/radiation/quality/duration/timing/severity/associated sxs/prior Treatment) HPI Comments: Family member states pt was drinking last night and may have fallen and not remembered it.  Patient is a 64 y.o. male presenting with chest pain. The history is provided by the patient.  Chest Pain Pain location:  R chest Pain quality: sharp and stabbing   Pain radiates to:  Does not radiate Pain radiates to the back: no   Pain severity:  Severe Onset quality:  Sudden Duration:  5 hours Timing:  Constant Progression:  Unchanged Chronicity:  New Context comment:  Woke up this morning with severe right sided chest pain Relieved by:  None tried Worsened by:  Coughing, deep breathing and movement Ineffective treatments:  None tried Associated symptoms: cough   Associated symptoms: no abdominal pain, no fever, no nausea and no shortness of breath   Risk factors: smoking     History reviewed. No pertinent past medical history. Past Surgical History  Procedure Laterality Date  . Ankle surgery Right 1994   No family history on file. History  Substance Use Topics  . Smoking status: Current Every Day Smoker  . Smokeless tobacco: Not on file  . Alcohol Use: Yes    Review of Systems  Constitutional: Negative for fever.  Respiratory: Positive for cough. Negative for shortness of breath.   Cardiovascular: Positive for chest pain.  Gastrointestinal: Negative for nausea and abdominal pain.  All other systems reviewed and are negative.     Allergies  Review of patient's allergies indicates no known allergies.  Home Medications   Prior to Admission medications   Medication Sig Start Date End Date Taking? Authorizing Provider  clindamycin (CLEOCIN) 150 MG capsule Take 300 mg by mouth 3  (three) times daily. May dispense as  capsules. Completed course of medication two months ago from today. 02-15-14 12/11/13   Janene Harvey Palmer, PA-C   BP 101/64 mmHg  Pulse 69  Temp(Src) 97.7 F (36.5 C) (Oral)  Resp 18  Ht  (1.702 m)  Wt 154 lb (69.854 kg)  BMI 24.11 kg/m2  SpO2 97% Physical Exam  Constitutional: He is oriented to person, place, and time. He appears well-developed and well-nourished. No distress.  HENT:  Head: Normocephalic and atraumatic.  Mouth/Throat: Oropharynx is clear and moist.  Eyes: Conjunctivae and EOM are normal. Pupils are equal, round, and reactive to light.  Neck: Normal range of motion. Neck supple.  Cardiovascular: Normal rate, regular rhythm and intact distal pulses.   No murmur heard. Pulmonary/Chest: Effort normal and breath sounds normal. No respiratory distress. He has no wheezes. He has no rales. He exhibits tenderness. He exhibits no crepitus and no deformity.    Abdominal: Soft. He exhibits no distension. There is no tenderness. There is no rebound and no guarding.  Musculoskeletal: Normal range of motion. He exhibits no edema or tenderness.  Neurological: He is alert and oriented to person, place, and time.  Skin: Skin is warm and dry. No rash noted. No erythema.  Psychiatric: He has a normal mood and affect. His behavior is normal.  Nursing note and vitals reviewed.   ED Course  Procedures (including critical care time) Labs Review Labs Reviewed - No data to display  Imaging Review Dg Ribs Unilateral W/chest Right  11/04/2014   CLINICAL DATA:  Right-sided rib pain, no known injury, initial encounter  EXAM: RIGHT RIBS AND CHEST - 3+ VIEW  COMPARISON:  None.  FINDINGS: There is a mildly displaced fracture of the right ninth rib posterior laterally. No associated pneumothorax is seen. No focal infiltrate or pleural effusion is seen. A well-circumscribed rounded density is noted on the frontal film over the left mid lung. This may  represent a calcified granuloma although could represent a nipple shadow.  IMPRESSION: Undisplaced right ninth rib fracture without complicating factors.  Well-circumscribed density in the left mid lung. This likely represents a calcified granuloma or nipple shadow. Short-term followup in 3 months is recommended to assess for stability. This should be performed with nipple markers in place.   Electronically Signed   By: Alcide CleverMark  Lukens M.D.   On: 11/04/2014 07:05     EKG Interpretation None      MDM   Final diagnoses:  Right rib fracture, closed, initial encounter    Patient with severe right rib pain that started this morning. It is worse with coughing, movement and taking a deep breath. Patient's family member states he was drinking alcohol heavily last night the patient cannot recall a fall. He is otherwise well-appearing has breath sounds bilaterally and an oxygen saturation of 97%. Chest x-ray today shows a nondisplaced right ninth rib fracture consistent with patient's pain and location of the pain.  Will treat with pain medicine and have him follow-up in approximately 3 months for repeat x-ray.    Gwyneth SproutWhitney Dorsey Authement, MD 11/04/14 [phone]  Gwyneth SproutWhitney Pamlea Finder, MD 11/04/14 [id number]

## 2015-10-29 ENCOUNTER — Encounter (HOSPITAL_COMMUNITY): Payer: Self-pay | Admitting: Emergency Medicine

## 2015-10-29 ENCOUNTER — Emergency Department (HOSPITAL_COMMUNITY)
Admission: EM | Admit: 2015-10-29 | Discharge: 2015-10-29 | Disposition: A | Discharge status: Home / Self Care | Payer: Self-pay | Attending: Emergency Medicine | Admitting: Emergency Medicine

## 2015-10-29 ENCOUNTER — Emergency Department (HOSPITAL_COMMUNITY): Payer: Self-pay

## 2015-10-29 DIAGNOSIS — R5383 Other fatigue: Secondary | ICD-10-CM | POA: Insufficient documentation

## 2015-10-29 DIAGNOSIS — R531 Weakness: Secondary | ICD-10-CM | POA: Insufficient documentation

## 2015-10-29 DIAGNOSIS — J189 Pneumonia, unspecified organism: Secondary | ICD-10-CM

## 2015-10-29 DIAGNOSIS — F17200 Nicotine dependence, unspecified, uncomplicated: Secondary | ICD-10-CM | POA: Insufficient documentation

## 2015-10-29 DIAGNOSIS — R42 Dizziness and giddiness: Secondary | ICD-10-CM | POA: Insufficient documentation

## 2015-10-29 DIAGNOSIS — J159 Unspecified bacterial pneumonia: Secondary | ICD-10-CM | POA: Insufficient documentation

## 2015-10-29 DIAGNOSIS — E860 Dehydration: Secondary | ICD-10-CM | POA: Insufficient documentation

## 2015-10-29 DIAGNOSIS — K4090 Unilateral inguinal hernia, without obstruction or gangrene, not specified as recurrent: Secondary | ICD-10-CM | POA: Insufficient documentation

## 2015-10-29 DIAGNOSIS — N5089 Other specified disorders of the male genital organs: Secondary | ICD-10-CM | POA: Insufficient documentation

## 2015-10-29 LAB — URINALYSIS, ROUTINE W REFLEX MICROSCOPIC
Bilirubin Urine: NEGATIVE
Glucose, UA: NEGATIVE mg/dL
Hgb urine dipstick: NEGATIVE
Ketones, ur: NEGATIVE mg/dL
LEUKOCYTES UA: NEGATIVE
Nitrite: NEGATIVE
PROTEIN: NEGATIVE mg/dL
SPECIFIC GRAVITY, URINE: 1.012 (ref 1.005–1.030)
pH: 6 (ref 5.0–8.0)

## 2015-10-29 LAB — BASIC METABOLIC PANEL
ANION GAP: 10 (ref 5–15)
BUN: 10 mg/dL (ref 6–20)
CALCIUM: 10.4 mg/dL — AB (ref 8.9–10.3)
CO2: 23 mmol/L (ref 22–32)
Chloride: 100 mmol/L — ABNORMAL LOW (ref 101–111)
Creatinine, Ser: 0.72 mg/dL (ref 0.61–1.24)
GFR calc Af Amer: >60 mL/min (ref >60)
Glucose, Bld: 178 mg/dL — ABNORMAL HIGH (ref 65–99)
Potassium: 4.1 mmol/L (ref 3.5–5.1)
Sodium: 133 mmol/L — ABNORMAL LOW (ref 135–145)

## 2015-10-29 LAB — CBC
HCT: 43.5 % (ref 39.0–52.0)
HEMOGLOBIN: 15.2 g/dL (ref 13.0–17.0)
MCH: 30.6 pg (ref 26.0–34.0)
MCHC: 34.9 g/dL (ref 30.0–36.0)
MCV: 87.7 fL (ref 78.0–100.0)
PLATELETS: 160 10*3/uL (ref 150–400)
RBC: 4.96 MIL/uL (ref 4.22–5.81)
RDW: 14.5 % (ref 11.5–15.5)
WBC: 6.7 10*3/uL (ref 4.0–10.5)

## 2015-10-29 LAB — CBG MONITORING, ED: GLUCOSE-CAPILLARY: 145 mg/dL — AB (ref 65–99)

## 2015-10-29 LAB — TROPONIN I: Troponin I: <0.03 ng/mL (ref <0.031)

## 2015-10-29 MED ORDER — DEXTROSE 5 % IV SOLN
1.0000 g | Freq: Once | INTRAVENOUS | Status: AC
  Administered 2015-10-29: 1 g via INTRAVENOUS
  Filled 2015-10-29: qty 10

## 2015-10-29 MED ORDER — AZITHROMYCIN 250 MG PO TABS: 250.0000 mg | ORAL_TABLET | Freq: Every day | ORAL | Status: DC

## 2015-10-29 MED ORDER — SODIUM CHLORIDE 0.9 % IV BOLUS (SEPSIS)
1000.0000 mL | Freq: Once | INTRAVENOUS | Status: AC
Start: 2015-10-29 — End: 2015-10-29
  Administered 2015-10-29: 1000 mL via INTRAVENOUS

## 2015-10-29 NOTE — Discharge Instructions (Signed)
Community-Acquired Pneumonia, Adult °Pneumonia is an infection of the lungs. There are different types of pneumonia. One type can develop while a person is in a hospital. A different type, called community-acquired pneumonia, develops in people who are not, or have not recently been, in the hospital or other health care facility.  °CAUSES °Pneumonia may be caused by bacteria, viruses, or funguses. Community-acquired pneumonia is often caused by Streptococcus pneumonia bacteria. These bacteria are often passed from one person to another by breathing in droplets from the cough or sneeze of an infected person. °RISK FACTORS °The condition is more likely to develop in: °· People who have chronic diseases, such as chronic obstructive pulmonary disease (COPD), asthma, congestive heart failure, cystic fibrosis, diabetes, or kidney disease. °· People who have early-stage or late-stage HIV. °· People who have sickle cell disease. °· People who have had their spleen removed (splenectomy). °· People who have poor dental hygiene. °· People who have medical conditions that increase the risk of breathing in (aspirating) secretions their own mouth and nose.   °· People who have a weakened immune system (immunocompromised). °· People who smoke. °· People who travel to areas where pneumonia-causing germs commonly exist. °· People who are around animal habitats or animals that have pneumonia-causing germs, including birds, bats, rabbits, cats, and farm animals. °SYMPTOMS °Symptoms of this condition include: °· A dry cough. °· A wet (productive) cough. °· Fever. °· Sweating. °· Chest pain, especially when breathing deeply or coughing. °· Rapid breathing or difficulty breathing. °· Shortness of breath. °· Shaking chills. °· Fatigue. °· Muscle aches. °DIAGNOSIS °Your health care provider will take a medical history and perform a physical exam. You may also have other tests, including: °· Imaging studies of your chest, including  X-rays. °· Tests to check your blood oxygen level and other blood gases. °· Other tests on blood, mucus (sputum), fluid around your lungs (pleural fluid), and urine. °If your pneumonia is severe, other tests may be done to identify the specific cause of your illness. °TREATMENT °The type of treatment that you receive depends on many factors, such as the cause of your pneumonia, the medicines you take, and other medical conditions that you have. For most adults, treatment and recovery from pneumonia may occur at home. In some cases, treatment must happen in a hospital. Treatment may include: °· Antibiotic medicines, if the pneumonia was caused by bacteria. °· Antiviral medicines, if the pneumonia was caused by a virus. °· Medicines that are given by mouth or through an IV tube. °· Oxygen. °· Respiratory therapy. °Although rare, treating severe pneumonia may include: °· Mechanical ventilation. This is done if you are not breathing well on your own and you cannot maintain a safe blood oxygen level. °· Thoracentesis. This procedure removes fluid around one lung or both lungs to help you breathe better. °HOME CARE INSTRUCTIONS °· Take over-the-counter and prescription medicines only as told by your health care provider. °¨ Only take cough medicine if you are losing sleep. Understand that cough medicine can prevent your body's natural ability to remove mucus from your lungs. °¨ If you were prescribed an antibiotic medicine, take it as told by your health care provider. Do not stop taking the antibiotic even if you start to feel better. °· Sleep in a semi-upright position at night. Try sleeping in a reclining chair, or place a few pillows under your head. °· Do not use tobacco products, including cigarettes, chewing tobacco, and e-cigarettes. If you need help quitting, ask your health care provider. °· Drink enough water to keep your urine   clear or pale yellow. This will help to thin out mucus secretions in your  lungs. PREVENTION There are ways that you can decrease your risk of developing community-acquired pneumonia. Consider getting a pneumococcal vaccine if:  You are older than 63 years of age.  You are older than 63 years of age and are undergoing cancer treatment, have chronic lung disease, or have other medical conditions that affect your immune system. Ask your health care provider if this applies to you. There are different types and schedules of pneumococcal vaccines. Ask your health care provider which vaccination option is best for you. You may also prevent community-acquired pneumonia if you take these actions:  Get an influenza vaccine every year. Ask your health care provider which type of influenza vaccine is best for you.  Go to the dentist on a regular basis.  Wash your hands often. Use hand sanitizer if soap and water are not available. SEEK MEDICAL CARE IF:  You have a fever.  You are losing sleep because you cannot control your cough with cough medicine. SEEK IMMEDIATE MEDICAL CARE IF:  You have worsening shortness of breath.  You have increased chest pain.  Your sickness becomes worse, especially if you are an older adult or have a weakened immune system.  You cough up blood.   This information is not intended to replace advice given to you by your health care provider. Make sure you discuss any questions you have with your health care provider.   Document Released: 08/04/2005 Document Revised: 04/25/2015 Document Reviewed: 11/29/2014 Elsevier Interactive Patient Education 2016 Elsevier Inc.  Dehydration, Adult Dehydration means your body does not have as much fluid or water as it needs. It happens when you take in less fluid than you lose. Your kidneys, brain, and heart will not work properly without the right amount of fluids.  Dehydration can range from mild to severe. It should be treated right away to help prevent it from becoming severe. HOME CARE  Drink  enough fluid to keep your pee (urine) clear or pale yellow.  Drink water or fluid slowly by taking small sips. You can also try sucking on ice cubes.  Have food or drinks that contain electrolytes. Examples include bananas and sports drinks.  Take over-the-counter and prescription medicines only as told by your doctor.  Prepare oral rehydration solution (ORS) according to the instructions that came with it. Take sips of ORS every 5 minutes until your pee returns to normal.  If you are throwing up (vomiting) or have watery poop (diarrhea), keep trying to drink water, ORS, or both.  If you have watery poop, avoid:  Drinks with caffeine.  Fruit juice.  Milk.  Carbonated soft drinks.  Do not take salt tablets. This can lead to having too much sodium in your body (hypernatremia). GET HELP IF:  You cannot eat or drink without throwing up.  You have had mild watery poop for longer than 24 hours.  You have a fever. GET HELP RIGHT AWAY IF:   You have very strong thirst.  You have very bad watery poop.  You have not peed in 6-8 hours, or you have peed only a small amount of very dark pee.  You have shriveled skin.  You are dizzy, confused, or both.   This information is not intended to replace advice given to you by your health care provider. Make sure you discuss any questions you have with your health care provider.   Document Released: 05/31/2009 Document  Revised: 04/25/2015 Document Reviewed: 12/20/2014 Elsevier Interactive Patient Education 2016 Elsevier Inc.  Inguinal Hernia, Adult , Care After Refer to this sheet in the next few weeks. These discharge instructions provide you with general information on caring for yourself after you leave the hospital. Your caregiver may also give you specific instructions. Your treatment has been planned according to the most current medical practices available, but unavoidable complications sometimes occur. If you have any problems or  questions after discharge, please call your caregiver. HOME CARE INSTRUCTIONS  Put ice on the operative site.  Put ice in a plastic bag.  Place a towel between your skin and the bag.  Leave the ice on for 15-20 minutes at a time, 03-04 times a day while awake.  Change bandages (dressings) as directed.  Keep the wound dry and clean. The wound may be washed gently with soap and water. Gently blot or dab the wound dry. It is okay to take showers 24 to 48 hours after surgery. Do not take baths, use swimming pools, or use hot tubs for 10 days, or as directed by your caregiver.  Only take over-the-counter or prescription medicines for pain, discomfort, or fever as directed by your caregiver.  Continue your normal diet as directed.  Do not lift anything more than 10 pounds or play contact sports for 3 weeks, or as directed. SEEK MEDICAL CARE IF:  There is redness, swelling, or increasing pain in the wound.  There is fluid (pus) coming from the wound.  There is drainage from a wound lasting longer than 1 day.  You have an oral temperature above 102 F (38.9 C).  You notice a bad smell coming from the wound or dressing.  The wound breaks open after the stitches (sutures) have been removed.  You notice increasing pain in the shoulders (shoulder strap areas).  You develop dizzy episodes or fainting while standing.  You feel sick to your stomach (nauseous) or throw up (vomit). SEEK IMMEDIATE MEDICAL CARE IF:  You develop a rash.  You have difficulty breathing.  You develop a reaction or have side effects to medicines you were given. MAKE SURE YOU:   Understand these instructions.  Will watch your condition.  Will get help right away if you are not doing well or get worse.   This information is not intended to replace advice given to you by your health care provider. Make sure you discuss any questions you have with your health care provider.   Document Released:  09/04/2006 Document Revised: 08/25/2014 Document Reviewed: 02/05/2015 Elsevier Interactive Patient Education [employer] Elsevier Inc.

## 2015-10-29 NOTE — ED Notes (Signed)
Patient transported to X-ray 

## 2015-10-29 NOTE — ED Notes (Signed)
CBG 145  

## 2015-10-29 NOTE — ED Provider Notes (Signed)
CSN: [id number]     Arrival date & time 10/29/15  0855 History   First MD Initiated Contact with Patient 10/29/15 0930     Chief Complaint  Patient presents with  . Weakness  . Hernia     (Consider location/radiation/quality/duration/timing/severity/associated sxs/prior Treatment) HPI Patient presents with generalized weakness, lightheadedness and decreased appetite for the past 2 days. States the lightheadedness is worse with standing. Reports decreased energy. No fever or chills. Has had a nonproductive cough. Denies nasal congestion, sinus pressure or sore throat. No focal weakness or numbness. No chest pain or abdominal pain. Denies nausea, vomiting or diarrhea. States he does have some urinary hesitancy but denies any dysuria or hematuria. Has ongoing left inguinal hernia. No new pain. Has been seen in emergency department for the hernia and referred to a surgeon. States he is unable to afford to see the surgeon. History reviewed. No pertinent past medical history. Past Surgical History  Procedure Laterality Date  . Ankle surgery Right 1994   No family history on file. Social History  Substance Use Topics  . Smoking status: Current Every Day Smoker  . Smokeless tobacco: None  . Alcohol Use: Yes    Review of Systems  Constitutional: Positive for activity change, appetite change and fatigue. Negative for fever and chills.  HENT: Negative for congestion, sinus pressure and sore throat.   Respiratory: Positive for cough. Negative for shortness of breath.   Cardiovascular: Negative for chest pain.  Gastrointestinal: Negative for nausea, vomiting, abdominal pain, diarrhea and abdominal distention.  Genitourinary: Positive for scrotal swelling and difficulty urinating. Negative for dysuria, flank pain and testicular pain.  Musculoskeletal: Negative for back pain, neck pain and neck stiffness.  Skin: Negative for rash and wound.  Neurological: Positive for dizziness and  light-headedness. Negative for syncope, weakness, numbness and headaches.  All other systems reviewed and are negative.     Allergies  Review of patient's allergies indicates no known allergies.  Home Medications   Prior to Admission medications   Medication Sig Start Date End Date Taking? Authorizing Provider  azithromycin (ZITHROMAX) 250 MG tablet Take 1 tablet (250 mg total) by mouth daily. Take first 2 tablets together, then 1 every day until finished. 10/29/15   Loren Racer, MD   BP 131/77 mmHg  Pulse 70  Temp(Src) 98.4 F (36.9 C) (Oral)  Resp 17  Ht  (1.676 m)  Wt 145 lb (65.772 kg)  BMI 23.41 kg/m2  SpO2 97% Physical Exam  Constitutional: He is oriented to person, place, and time. He appears well-developed and well-nourished. No distress.  HENT:  Head: Normocephalic and atraumatic.  Mouth/Throat: Oropharynx is clear and moist. No oropharyngeal exudate.  No sinus tenderness with percussion.  Eyes: EOM are normal. Pupils are equal, round, and reactive to light.  No nystagmus  Neck: Normal range of motion. Neck supple.  No meningismus  Cardiovascular: Normal rate, regular rhythm and intact distal pulses.  Exam reveals no gallop and no friction rub.   No murmur heard. Pulmonary/Chest: Effort normal and breath sounds normal. No stridor. No respiratory distress. He has no wheezes. He has no rales. He exhibits no tenderness.  Abdominal: Soft. Bowel sounds are normal. He exhibits no distension and no mass. There is no tenderness. There is no rebound and no guarding.  Genitourinary:  Large left inguinal hernia that is reducible. No tenderness, induration or redness. No testicular pain.  Musculoskeletal: Normal range of motion. He exhibits no edema or tenderness.  No new lower  extremity swelling. No calf tenderness.  Neurological: He is alert and oriented to person, place, and time.  Patient is alert and oriented x3 with clear, goal oriented speech. Patient has 5/5  motor in all extremities. Sensation is intact to light touch.   Skin: Skin is warm and dry. No rash noted. No erythema.  Psychiatric: He has a normal mood and affect. His behavior is normal.  Nursing note and vitals reviewed.   ED Course  Procedures (including critical care time) Labs Review Labs Reviewed  BASIC METABOLIC PANEL - Abnormal; Notable for the following:    Sodium 133 (*)    Chloride 100 (*)    Glucose, Bld 178 (*)    Calcium 10.4 (*)    All other components within normal limits  CBG MONITORING, ED - Abnormal; Notable for the following:    Glucose-Capillary 145 (*)    All other components within normal limits  CBC  URINALYSIS, ROUTINE W REFLEX MICROSCOPIC (NOT AT [facility])  TROPONIN I    Imaging Review Dg Chest 2 View  10/29/2015  CLINICAL DATA:  Shortness of breath, cough today, smoker EXAM: CHEST  2 VIEW COMPARISON:  11/04/2014 FINDINGS: Normal heart size, mediastinal contours, pulmonary vascularity. Atherosclerotic calcification aorta. Emphysematous and bronchitic changes consistent with COPD. RIGHT infrahilar infiltrate. Minimal patchy infiltrate at LEFT base as well. No pleural effusion or pneumothorax. Bones unremarkable. IMPRESSION: Bibasilar pulmonary infiltrates greater on RIGHT question pneumonia. Electronically Signed   By: Ulyses SouthwardMark  Boles M.D.   On: 10/29/2015 10:23   I have personally reviewed and evaluated these images and lab results as part of my medical decision-making.   EKG Interpretation   Date/Time:  Monday October 29 2015 10:05:45 EDT Ventricular Rate:  69 PR Interval:  189 QRS Duration: 95 QT Interval:  387 QTC Calculation: 415 R Axis:   -139 Text Interpretation:  Sinus rhythm RSR' in V1 or V2, probably normal  variant Confirmed by Ranae PalmsYELVERTON  MD, Tamiah Dysart ([id number]) on 10/29/2015 12:12:01 PM      MDM   Final diagnoses:  CAP (community acquired pneumonia)  Left inguinal hernia  Generalized weakness  Dehydration    Patient is well-appearing.  Presents with new onset fatigue and generalized weakness. States he has a nonproductive cough. Is a current cigarette smoker. We'll screen with labs, UA and chest x-ray. Patient's been instructed that he will need to follow-up with a surgeon for his inguinal hernia. There is no evidence of incarceration or strangulation. Advised to seek another surgical opinion if he is unable to afford the surgeon that he was previously referred to.    Loren Raceravid Shayleen Eppinger, MD 10/29/15 1212

## 2015-10-29 NOTE — ED Notes (Signed)
Pt c/o generalized weakness and left side hernia in groin noted for a couple days.

## 2015-11-28 ENCOUNTER — Ambulatory Visit: Payer: Self-pay | Admitting: General Surgery

## 2015-11-28 NOTE — H&P (Signed)
Larry Meadows 11/28/2015 10:48 AM Location: [facility] Patient #: [id number] DOB: 08/30/1950 Single / Language: [place] / Race: Black or African American Male  History of Present Illness Larry Meadows(Brittin Janik J. Cyndra Feinberg MD; 11/28/2015 11:17 AM) The patient is a 65 year old male.   Note:He is referred by Dr. Ranae PalmsYelverton for consultation regarding a large left inguinal hernia. He states he has had the hernia for about 2 years and has not changed in size. Occasionally it causes him some discomfort. He states in the morning the area is flat but when he gets up and walks around swelling goes down into his left scrotal area. He denies any difficulty with urination or chronic constipation. He denies family history of hernia. His 2 sisters are here with him.  He states he has otherwise healthy and is not being treated for any major medical problems. He stays with one of his sisters.  Past Surgical History Larry Meadows(Ashley Beck, CMA; 11/28/2015 10:48 AM) No pertinent past surgical history  Diagnostic Studies History Larry Meadows(Ashley Beck, CMA; 11/28/2015 10:48 AM) Colonoscopy never  Allergies Larry Meadows(Ashley Beck, CMA; 11/28/2015 10:49 AM) No Known Drug Allergies 11/28/2015  Medication History Larry Meadows(Ashley Beck, CMA; 11/28/2015 10:49 AM) No Current Medications Medications Reconciled  Social History Larry Meadows(Ashley Beck, CMA; 11/28/2015 10:48 AM) Alcohol use Occasional alcohol use. Caffeine use Coffee. Tobacco use Current every day smoker.  Family History Larry Meadows(Ashley Beck, [place]; 11/28/2015 10:48 AM) Heart Disease Mother. Ovarian Cancer Sister.     Review of Systems Larry Meadows(Ashley Beck CMA; 11/28/2015 10:48 AM) General Not Present- Appetite Loss, Chills, Fatigue, Fever, Night Sweats, Weight Gain and Weight Loss. Skin Not Present- Change in Wart/Mole, Dryness, Hives, Jaundice, New Lesions, Non-Healing Wounds, Rash and Ulcer. HEENT Not Present- Earache, Hearing Loss, Hoarseness, Nose Bleed, Oral Ulcers, Ringing in the Ears,  Seasonal Allergies, Sinus Pain, Sore Throat, Visual Disturbances, Wears glasses/contact lenses and Yellow Eyes. Respiratory Not Present- Bloody sputum, Chronic Cough, Difficulty Breathing, Snoring and Wheezing. Breast Not Present- Breast Mass, Breast Pain, Nipple Discharge and Skin Changes. Cardiovascular Not Present- Chest Pain, Difficulty Breathing Lying Down, Leg Cramps, Palpitations, Rapid Heart Rate, Shortness of Breath and Swelling of Extremities. Gastrointestinal Not Present- Abdominal Pain, Bloating, Bloody Stool, Change in Bowel Habits, Chronic diarrhea, Constipation, Difficulty Swallowing, Excessive gas, Gets full quickly at meals, Hemorrhoids, Indigestion, Nausea, Rectal Pain and Vomiting. Male Genitourinary Not Present- Blood in Urine, Change in Urinary Stream, Frequency, Impotence, Nocturia, Painful Urination, Urgency and Urine Leakage. Neurological Not Present- Decreased Memory, Fainting, Headaches, Numbness, Seizures, Tingling, Tremor, Trouble walking and Weakness. Psychiatric Not Present- Anxiety, Bipolar, Change in Sleep Pattern, Depression, Fearful and Frequent crying. Endocrine Not Present- Cold Intolerance, Excessive Hunger, Hair Changes, Heat Intolerance, Hot flashes and New Diabetes. Hematology Not Present- Easy Bruising, Excessive bleeding, Gland problems, HIV and Persistent Infections.  Vitals Larry Meadows(Ashley Beck CMA; 11/28/2015 10:49 AM) 11/28/2015 10:49 AM Weight: 135 lb Height: 67in Body Surface Area: 1.71 m Body Mass Index: 21.14 kg/m  Temp.: 98.49F  Pulse: 74 (Regular)  BP: 130/70 (Sitting, Left Arm, Standard)      Physical Exam Larry Meadows(Dallon Dacosta J. Ivery Michalski MD; 11/28/2015 11:18 AM)  The physical exam findings are as follows: Note:General: Thin male in NAD. Pleasant and cooperative.  EYES: no icterus  CV: RRR, no murmur, no JVD.  CHEST: Breath sounds equal and clear. Respirations nonlabored.  ABDOMEN: Soft, nontender, no hernias.  GU: There is a large left  inguinal bulge that extends into the distal scrotum. It is reducible in the supine position. There is a very  small right inguinal bulge that reduces easily in the supine position.  NEUROLOGIC: Alert and oriented, answers questions appropriately.  PSYCHIATRIC: Normal mood, affect , and behavior.    Assessment & Plan (Delmi Fulfer J. Carlyne Keehan MD; 11/28/2015 11:15 AM)  NON-RECURRENT BILATERAL INGUINAL HERNIA WITHOUT OBSTRUCTION OR GANGRENE (K40.20) Impression: Left-sided hernia is a large scrotal hernia and he is interested in repair. Right sided hernia is asymptomatic and very small. He only wants to fix the left side. Given that the right side is asymptomatic, I think this is okay.  Plan: Outpatient open left inguinal hernia repair with mesh. I have explained the procedure, risks, and aftercare of inguinal hernia repair to him and his sisters. Risks include but are not limited to bleeding, infection, wound problems, anesthesia, recurrence, bladder or intestine injury, urinary retention, testicular dysfunction, chronic pain, mesh problems. He seems to understand and agrees to proceed.  Roxanna Mcever, MD 

## 2015-12-07 DIAGNOSIS — K4090 Unilateral inguinal hernia, without obstruction or gangrene, not specified as recurrent: Secondary | ICD-10-CM

## 2015-12-07 HISTORY — DX: Unilateral inguinal hernia, without obstruction or gangrene, not specified as recurrent: K40.90

## 2015-12-10 ENCOUNTER — Encounter (HOSPITAL_BASED_OUTPATIENT_CLINIC_OR_DEPARTMENT_OTHER): Payer: Self-pay | Admitting: *Deleted

## 2015-12-10 NOTE — Progress Notes (Signed)
[facility]- CXR, lab work on arrival-Instructed Npo after Marshall & IlsleyMn-states understands.

## 2015-12-17 ENCOUNTER — Encounter (HOSPITAL_BASED_OUTPATIENT_CLINIC_OR_DEPARTMENT_OTHER): Payer: Self-pay | Admitting: Anesthesiology

## 2015-12-17 ENCOUNTER — Ambulatory Visit (HOSPITAL_COMMUNITY): Payer: Self-pay

## 2015-12-17 ENCOUNTER — Encounter (HOSPITAL_BASED_OUTPATIENT_CLINIC_OR_DEPARTMENT_OTHER)
Admission: RE | Disposition: A | Discharge status: Home / Self Care | Payer: Self-pay | Source: Ambulatory Visit | Attending: General Surgery

## 2015-12-17 ENCOUNTER — Ambulatory Visit (HOSPITAL_BASED_OUTPATIENT_CLINIC_OR_DEPARTMENT_OTHER): Payer: Self-pay | Admitting: Anesthesiology

## 2015-12-17 ENCOUNTER — Ambulatory Visit (HOSPITAL_BASED_OUTPATIENT_CLINIC_OR_DEPARTMENT_OTHER)
Admission: RE | Admit: 2015-12-17 | Discharge: 2015-12-17 | Disposition: A | Discharge status: Home / Self Care | Payer: Self-pay | Source: Ambulatory Visit | Attending: General Surgery | Admitting: General Surgery

## 2015-12-17 DIAGNOSIS — J449 Chronic obstructive pulmonary disease, unspecified: Secondary | ICD-10-CM | POA: Insufficient documentation

## 2015-12-17 DIAGNOSIS — K4090 Unilateral inguinal hernia, without obstruction or gangrene, not specified as recurrent: Secondary | ICD-10-CM | POA: Insufficient documentation

## 2015-12-17 DIAGNOSIS — Z01811 Encounter for preprocedural respiratory examination: Secondary | ICD-10-CM

## 2015-12-17 HISTORY — DX: Pneumonia, unspecified organism: J18.9

## 2015-12-17 HISTORY — PX: INSERTION OF MESH: SHX5868

## 2015-12-17 HISTORY — PX: INGUINAL HERNIA REPAIR: SHX194

## 2015-12-17 HISTORY — DX: Unilateral inguinal hernia, without obstruction or gangrene, not specified as recurrent: K40.90

## 2015-12-17 LAB — COMPREHENSIVE METABOLIC PANEL
ALT: 14 U/L — ABNORMAL LOW (ref 17–63)
AST: 23 U/L (ref 15–41)
Albumin: 3.9 g/dL (ref 3.5–5.0)
Alkaline Phosphatase: 69 U/L (ref 38–126)
Anion gap: 10 (ref 5–15)
BILIRUBIN TOTAL: 0.3 mg/dL (ref 0.3–1.2)
BUN: 10 mg/dL (ref 6–20)
CO2: 24 mmol/L (ref 22–32)
CREATININE: 0.79 mg/dL (ref 0.61–1.24)
Calcium: 9.8 mg/dL (ref 8.9–10.3)
Chloride: 105 mmol/L (ref 101–111)
GFR calc Af Amer: >60 mL/min (ref >60)
GFR calc non Af Amer: >60 mL/min (ref >60)
GLUCOSE: 88 mg/dL (ref 65–99)
Potassium: 4.2 mmol/L (ref 3.5–5.1)
Sodium: 139 mmol/L (ref 135–145)
TOTAL PROTEIN: 7.6 g/dL (ref 6.5–8.1)

## 2015-12-17 LAB — CBC WITH DIFFERENTIAL/PLATELET
BASOS ABS: 0 10*3/uL (ref 0.0–0.1)
BASOS PCT: 1 %
Eosinophils Absolute: 0.2 10*3/uL (ref 0.0–0.7)
Eosinophils Relative: 3 %
HEMATOCRIT: 47.8 % (ref 39.0–52.0)
HEMOGLOBIN: 16.7 g/dL (ref 13.0–17.0)
LYMPHS PCT: 38 %
Lymphs Abs: 2.2 10*3/uL (ref 0.7–4.0)
MCH: 31.7 pg (ref 26.0–34.0)
MCHC: 34.9 g/dL (ref 30.0–36.0)
MCV: 90.7 fL (ref 78.0–100.0)
MONO ABS: 0.6 10*3/uL (ref 0.1–1.0)
MONOS PCT: 11 %
NEUTROS ABS: 2.9 10*3/uL (ref 1.7–7.7)
Neutrophils Relative %: 49 %
Platelets: 273 10*3/uL (ref 150–400)
RBC: 5.27 MIL/uL (ref 4.22–5.81)
RDW: 13.6 % (ref 11.5–15.5)
WBC: 6 10*3/uL (ref 4.0–10.5)

## 2015-12-17 LAB — PROTIME-INR
INR: 1.05 (ref 0.00–1.49)
Prothrombin Time: 13.9 seconds (ref 11.6–15.2)

## 2015-12-17 SURGERY — REPAIR, HERNIA, INGUINAL, ADULT
Anesthesia: General | Laterality: Left

## 2015-12-17 MED ORDER — LIDOCAINE HCL (CARDIAC) 20 MG/ML IV SOLN
INTRAVENOUS | Status: DC | PRN
  Administered 2015-12-17: 60 mg via INTRAVENOUS

## 2015-12-17 MED ORDER — PROPOFOL 10 MG/ML IV BOLUS
INTRAVENOUS | Status: AC
  Filled 2015-12-17: qty 40

## 2015-12-17 MED ORDER — ACETAMINOPHEN 325 MG PO TABS
650.0000 mg | ORAL_TABLET | ORAL | Status: DC | PRN
  Filled 2015-12-17: qty 2

## 2015-12-17 MED ORDER — LIDOCAINE HCL (CARDIAC) 20 MG/ML IV SOLN
INTRAVENOUS | Status: AC
  Filled 2015-12-17: qty 5

## 2015-12-17 MED ORDER — SODIUM CHLORIDE 0.9% FLUSH
3.0000 mL | INTRAVENOUS | Status: DC | PRN
  Filled 2015-12-17: qty 3

## 2015-12-17 MED ORDER — ONDANSETRON HCL 4 MG/2ML IJ SOLN
INTRAMUSCULAR | Status: DC | PRN
  Administered 2015-12-17: 4 mg via INTRAVENOUS

## 2015-12-17 MED ORDER — WHITE PETROLATUM GEL
Status: AC
  Filled 2015-12-17: qty 5

## 2015-12-17 MED ORDER — MORPHINE SULFATE (PF) 2 MG/ML IV SOLN
2.0000 mg | INTRAVENOUS | Status: DC | PRN
  Filled 2015-12-17: qty 3

## 2015-12-17 MED ORDER — CEFAZOLIN SODIUM-DEXTROSE 2-4 GM/100ML-% IV SOLN
INTRAVENOUS | Status: AC
Start: 2015-12-17 — End: 2015-12-17
  Filled 2015-12-17: qty 100

## 2015-12-17 MED ORDER — FENTANYL CITRATE (PF) 100 MCG/2ML IJ SOLN
25.0000 ug | INTRAMUSCULAR | Status: DC | PRN
  Filled 2015-12-17: qty 1

## 2015-12-17 MED ORDER — MIDAZOLAM HCL 2 MG/2ML IJ SOLN
INTRAMUSCULAR | Status: AC
  Filled 2015-12-17: qty 2

## 2015-12-17 MED ORDER — EPHEDRINE SULFATE 50 MG/ML IJ SOLN
INTRAMUSCULAR | Status: DC | PRN
  Administered 2015-12-17: 15 mg via INTRAVENOUS
  Administered 2015-12-17: 10 mg via INTRAVENOUS

## 2015-12-17 MED ORDER — DEXAMETHASONE SODIUM PHOSPHATE 4 MG/ML IJ SOLN
INTRAMUSCULAR | Status: DC | PRN
  Administered 2015-12-17: 10 mg via INTRAVENOUS

## 2015-12-17 MED ORDER — EPHEDRINE SULFATE 50 MG/ML IJ SOLN
INTRAMUSCULAR | Status: AC
  Filled 2015-12-17: qty 1

## 2015-12-17 MED ORDER — BUPIVACAINE-EPINEPHRINE 0.5% -1:200000 IJ SOLN
INTRAMUSCULAR | Status: DC | PRN
  Administered 2015-12-17: 10 mL

## 2015-12-17 MED ORDER — ONDANSETRON HCL 4 MG/2ML IJ SOLN
INTRAMUSCULAR | Status: AC
  Filled 2015-12-17: qty 2

## 2015-12-17 MED ORDER — OXYCODONE HCL 5 MG PO TABS
ORAL_TABLET | ORAL | Status: AC
  Filled 2015-12-17: qty 1

## 2015-12-17 MED ORDER — DEXAMETHASONE SODIUM PHOSPHATE 10 MG/ML IJ SOLN
INTRAMUSCULAR | Status: AC
  Filled 2015-12-17: qty 1

## 2015-12-17 MED ORDER — ACETAMINOPHEN 650 MG RE SUPP
650.0000 mg | RECTAL | Status: DC | PRN
  Filled 2015-12-17: qty 1

## 2015-12-17 MED ORDER — FENTANYL CITRATE (PF) 100 MCG/2ML IJ SOLN
INTRAMUSCULAR | Status: AC
  Filled 2015-12-17: qty 2

## 2015-12-17 MED ORDER — KETOROLAC TROMETHAMINE 30 MG/ML IJ SOLN
INTRAMUSCULAR | Status: AC
  Filled 2015-12-17: qty 1

## 2015-12-17 MED ORDER — ARTIFICIAL TEARS OP OINT
TOPICAL_OINTMENT | OPHTHALMIC | Status: AC
  Filled 2015-12-17: qty 3.5

## 2015-12-17 MED ORDER — OXYCODONE HCL 5 MG PO TABS
5.0000 mg | ORAL_TABLET | ORAL | Status: DC | PRN
  Administered 2015-12-17: 5 mg via ORAL
  Filled 2015-12-17: qty 2

## 2015-12-17 MED ORDER — PROPOFOL 10 MG/ML IV BOLUS
INTRAVENOUS | Status: DC | PRN
  Administered 2015-12-17: 120 mg via INTRAVENOUS
  Administered 2015-12-17: 50 mg via INTRAVENOUS

## 2015-12-17 MED ORDER — ROPIVACAINE HCL 5 MG/ML IJ SOLN
INTRAMUSCULAR | Status: AC
  Filled 2015-12-17: qty 30

## 2015-12-17 MED ORDER — CEFAZOLIN SODIUM-DEXTROSE 2-4 GM/100ML-% IV SOLN
2.0000 g | INTRAVENOUS | Status: DC
  Filled 2015-12-17: qty 100

## 2015-12-17 MED ORDER — MIDAZOLAM HCL 5 MG/5ML IJ SOLN
INTRAMUSCULAR | Status: DC | PRN
  Administered 2015-12-17: 2 mg via INTRAVENOUS

## 2015-12-17 MED ORDER — FENTANYL CITRATE (PF) 100 MCG/2ML IJ SOLN
INTRAMUSCULAR | Status: AC
  Filled 2015-12-17: qty 4

## 2015-12-17 MED ORDER — FENTANYL CITRATE (PF) 100 MCG/2ML IJ SOLN
INTRAMUSCULAR | Status: DC | PRN
  Administered 2015-12-17: 25 ug via INTRAVENOUS
  Administered 2015-12-17: 50 ug via INTRAVENOUS
  Administered 2015-12-17 (×3): 25 ug via INTRAVENOUS

## 2015-12-17 MED ORDER — OXYCODONE HCL 5 MG PO TABS: 5.0000 mg | ORAL_TABLET | ORAL | Status: AC | PRN

## 2015-12-17 MED ORDER — ROPIVACAINE HCL 5 MG/ML IJ SOLN
INTRAMUSCULAR | Status: DC | PRN
  Administered 2015-12-17: 30 mL

## 2015-12-17 MED ORDER — LACTATED RINGERS IV SOLN
INTRAVENOUS | Status: DC
  Administered 2015-12-17 (×2): via INTRAVENOUS
  Filled 2015-12-17: qty 1000

## 2015-12-17 SURGICAL SUPPLY — 60 items
APL SKNCLS STERI-STRIP NONHPOA (GAUZE/BANDAGES/DRESSINGS) ×1
BENZOIN TINCTURE PRP APPL 2/3 (GAUZE/BANDAGES/DRESSINGS) ×3 IMPLANT
BLADE CLIPPER SURG (BLADE) ×2 IMPLANT
BLADE HEX COATED 2.75 (ELECTRODE) ×3 IMPLANT
BLADE SURG 10 STRL SS (BLADE) ×3 IMPLANT
BLADE SURG 15 STRL LF DISP TIS (BLADE) ×1 IMPLANT
BLADE SURG 15 STRL SS (BLADE) ×3
CANISTER SUCTION 1200CC (MISCELLANEOUS) IMPLANT
CHLORAPREP W/TINT 26ML (MISCELLANEOUS) ×3 IMPLANT
CLEANER CAUTERY TIP 5X5 PAD (MISCELLANEOUS) ×1 IMPLANT
CLOSURE WOUND 1/2 X4 (GAUZE/BANDAGES/DRESSINGS) ×1
CLOSURE WOUND 1/4X4 (GAUZE/BANDAGES/DRESSINGS) ×1
CLOTH BEACON ORANGE TIMEOUT ST (SAFETY) ×3 IMPLANT
COVER BACK TABLE 60X90IN (DRAPES) ×3 IMPLANT
COVER MAYO STAND STRL (DRAPES) ×3 IMPLANT
DISSECTOR ROUND CHERRY 3/8 STR (MISCELLANEOUS) IMPLANT
DRAIN PENROSE 18X1/2 LTX STRL (DRAIN) ×3 IMPLANT
DRAPE INCISE IOBAN 66X45 STRL (DRAPES) ×3 IMPLANT
DRAPE LAPAROTOMY TRNSV 102X78 (DRAPE) ×3 IMPLANT
DRSG TEGADERM 4X4.75 (GAUZE/BANDAGES/DRESSINGS) ×3 IMPLANT
DRSG TELFA 3X8 NADH (GAUZE/BANDAGES/DRESSINGS) ×3 IMPLANT
ELECT REM PT RETURN 9FT ADLT (ELECTROSURGICAL) ×3
ELECTRODE REM PT RTRN 9FT ADLT (ELECTROSURGICAL) ×1 IMPLANT
GAUZE SPONGE 4X4 16PLY XRAY LF (GAUZE/BANDAGES/DRESSINGS) IMPLANT
GLOVE ECLIPSE 8.0 STRL XLNG CF (GLOVE) ×3 IMPLANT
GLOVE INDICATOR 8.0 STRL GRN (GLOVE) ×3 IMPLANT
GOWN STRL REUS W/ TWL LRG LVL3 (GOWN DISPOSABLE) ×1 IMPLANT
GOWN STRL REUS W/ TWL XL LVL3 (GOWN DISPOSABLE) ×1 IMPLANT
GOWN STRL REUS W/TWL LRG LVL3 (GOWN DISPOSABLE) ×3
GOWN STRL REUS W/TWL XL LVL3 (GOWN DISPOSABLE) ×3
KIT ROOM TURNOVER WOR (KITS) ×3 IMPLANT
MANIFOLD NEPTUNE II (INSTRUMENTS) IMPLANT
MESH HERNIA 3X6 (Mesh General) ×3 IMPLANT
NEEDLE HYPO 22GX1.5 SAFETY (NEEDLE) ×3 IMPLANT
NS IRRIG 500ML POUR BTL (IV SOLUTION) ×3 IMPLANT
PACK BASIN DAY SURGERY FS (CUSTOM PROCEDURE TRAY) ×3 IMPLANT
PAD CLEANER CAUTERY TIP 5X5 (MISCELLANEOUS) ×2
PAD DRESSING TELFA 3X8 NADH (GAUZE/BANDAGES/DRESSINGS) ×1 IMPLANT
PENCIL BUTTON HOLSTER BLD 10FT (ELECTRODE) ×3 IMPLANT
SPONGE GAUZE 4X4 12PLY STER LF (GAUZE/BANDAGES/DRESSINGS) ×2 IMPLANT
SPONGE LAP 4X18 X RAY DECT (DISPOSABLE) ×3 IMPLANT
STRIP CLOSURE SKIN 1/2X4 (GAUZE/BANDAGES/DRESSINGS) ×2 IMPLANT
STRIP CLOSURE SKIN 1/4X4 (GAUZE/BANDAGES/DRESSINGS) ×1 IMPLANT
SUT MON AB 4-0 PC3 18 (SUTURE) ×3 IMPLANT
SUT NOVA 0 T19/GS 22DT (SUTURE) IMPLANT
SUT PROLENE 0 CT 2 (SUTURE) IMPLANT
SUT PROLENE 2 0 CT2 30 (SUTURE) ×4 IMPLANT
SUT SILK 2 0 SH (SUTURE) ×2 IMPLANT
SUT SURG 0 T 19/GS 22 1969 62 (SUTURE) IMPLANT
SUT VIC AB 2-0 SH 18 (SUTURE) ×2 IMPLANT
SUT VIC AB 3-0 54X BRD REEL (SUTURE) IMPLANT
SUT VIC AB 3-0 54XBRD REEL (SUTURE) IMPLANT
SUT VIC AB 3-0 BRD 54 (SUTURE) ×3
SUT VIC AB 3-0 SH 27 (SUTURE) ×6
SUT VIC AB 3-0 SH 27X BRD (SUTURE) ×1 IMPLANT
SYR CONTROL 10ML LL (SYRINGE) ×3 IMPLANT
TOWEL OR 17X24 6PK STRL BLUE (TOWEL DISPOSABLE) ×6 IMPLANT
TUBE CONNECTING 12'X1/4 (SUCTIONS)
TUBE CONNECTING 12X1/4 (SUCTIONS) IMPLANT
YANKAUER SUCT BULB TIP NO VENT (SUCTIONS) IMPLANT

## 2015-12-17 NOTE — Discharge Instructions (Addendum)
[facility] _______[facility]   INGUINAL HERNIA REPAIR: POST OP INSTRUCTIONS  Always review your discharge instruction sheet given to you by the facility where your surgery was performed. IF YOU HAVE DISABILITY OR FAMILY LEAVE FORMS, YOU MUST BRING THEM TO THE OFFICE FOR PROCESSING.   DO NOT GIVE THEM TO YOUR DOCTOR.  1. A  prescription for pain medication may be given to you upon discharge.  Take your pain medication as prescribed, if needed.  If narcotic pain medicine is not needed, then you may take acetaminophen (Tylenol) or ibuprofen (Advil) as needed. 2. Take your usually prescribed medications unless otherwise directed. 3. If you need a refill on your pain medication, please contact your pharmacy.  They will contact our office to request authorization. Prescriptions will not be filled after 5 pm or on week-ends. 4. You should follow a light diet the first 24 hours after arrival home, such as soup and crackers, etc.  Be sure to include lots of fluids daily.  Resume your normal diet the day after surgery. 5. Most patients will experience some swelling and bruising in the groin and scrotum.  Ice packs and reclining will help.  Swelling and bruising can take several weeks to resolve.  6. It is common to experience some constipation if taking pain medication after surgery.  Increasing fluid intake and taking a stool softener (such as Colace) will usually help or prevent this problem from occurring.  A mild laxative (Milk of Magnesia or Miralax) should be taken according to package directions if there are no bowel movements after 48 hours. 7. Unless discharge instructions indicate otherwise, you may remove your bandages 72 hours after surgery, and you may shower the day after surgery.  You may have steri-strips (small skin tapes) in place directly over the incision.  These strips should be left on the skin.  If your surgeon used skin glue on the incision, you may shower in 24 hours.  The  glue will flake off over the next 2-3 weeks.  Any sutures or staples will be removed at the office during your follow-up visit. 8. ACTIVITIES:  You may resume regular (light) daily activities beginning the next day--such as daily self-care, walking, climbing stairs--gradually increasing activities as tolerated.  You may have sexual intercourse when it is comfortable.  Refrain from any heavy lifting or straining for 6 weeks (nothing over 10 pounds). a. You may drive when you are no longer taking prescription pain medication, you can comfortably wear a seatbelt, and you can safely maneuver your car and apply brakes. b. RETURN TO WORK:  __________________________________________________________ 9. You should see your doctor in the office for a follow-up appointment approximately 2-3 weeks after your surgery.  Make sure that you call for this appointment within a day or two after you arrive home to insure a convenient appointment time. 10. OTHER INSTRUCTIONS:  __________________________________________________________________________________________________________________________________________________________________________________________  WHEN TO CALL YOUR DOCTOR: 1. Fever over 101.0 2. Inability to urinate 3. Nausea and/or vomiting 4. Extreme swelling or bruising 5. Continued bleeding from incision. 6. Increased pain, redness, or drainage from the incision  The clinic staff is available to answer your questions during regular business hours.  Please dont hesitate to call and ask to speak to one of the nurses for clinical concerns.  If you have a medical emergency, go to the nearest emergency room or call 911.  [facility] always on call at the hospital   [address], [address]  [address], [place], [place]  [address] ?  [address] [address], [place], [place]   [address] [phone] ? [phone] ? FAX [phone] Web site: [email or web address]     Post  Anesthesia Home Care Instructions  Activity: Get plenty of rest for the remainder of the day. A responsible adult should stay with you for 24 hours following the procedure.  For the next 24 hours, DO NOT: -Drive a car -Advertising copywriter -Drink alcoholic beverages -Take any medication unless instructed by your physician -Make any legal decisions or sign important papers.  Meals: Start with liquid foods such as gelatin or soup. Progress to regular foods as tolerated. Avoid greasy, spicy, heavy foods. If nausea and/or vomiting occur, drink only clear liquids until the nausea and/or vomiting subsides. Call your physician if vomiting continues.  Special Instructions/Symptoms: Your throat may feel dry or sore from the anesthesia or the breathing tube placed in your throat during surgery. If this causes discomfort, gargle with warm salt water. The discomfort should disappear within 24 hours.  If you had a scopolamine patch placed behind your ear for the management of post- operative nausea and/or vomiting:  1. The medication in the patch is effective for 72 hours, after which it should be removed.  Wrap patch in a tissue and discard in the trash. Wash hands thoroughly with soap and water. 2. You may remove the patch earlier than 72 hours if you experience unpleasant side effects which may include dry mouth, dizziness or visual disturbances. 3. Avoid touching the patch. Wash your hands with soap and water after contact with the patch.

## 2015-12-17 NOTE — Anesthesia Procedure Notes (Addendum)
Anesthesia Regional Block:  TAP block  Pre-Anesthetic Checklist: ,, timeout performed, Correct Patient, Correct Site, Correct Laterality, Correct Procedure, Correct Position, site marked, Risks and benefits discussed,  Surgical consent,  Pre-op evaluation,  At surgeon's request and post-op pain management  Laterality: Left  Prep: chloraprep       Needles:   Needle Type: Echogenic Needle     Needle Length: 9cm 9 cm Needle Gauge: 21 and 21 G    Additional Needles:  Procedures: ultrasound guided (picture in chart) TAP block Narrative:  Start time: 12/17/2015 8:45 AM End time: 12/17/2015 8:55 AM Injection made incrementally with aspirations every 5 mL. Anesthesiologist: Ronelle NighEWELL, Viraj Liby   Procedure Name: LMA Insertion Date/Time: 12/17/2015 9:41 AM Performed by: Tyrone NineSAUVE, ROBIN F Pre-anesthesia Checklist: Patient identified, Timeout performed, Emergency Drugs available, Suction available and Patient being monitored Patient Re-evaluated:Patient Re-evaluated prior to inductionOxygen Delivery Method: Circle system utilized Preoxygenation: Pre-oxygenation with 100% oxygen Intubation Type: IV induction Ventilation: Mask ventilation without difficulty LMA: LMA inserted LMA Size: 4.0 Number of attempts: 1 Placement Confirmation: breath sounds checked- equal and bilateral and positive ETCO2 Tube secured with: Tape Dental Injury: Teeth and Oropharynx as per pre-operative assessment

## 2015-12-17 NOTE — Anesthesia Preprocedure Evaluation (Addendum)
Anesthesia Evaluation  Patient identified by MRN, date of birth, ID band Patient awake    Reviewed: Allergy & Precautions, H&P , NPO status , Patient's Chart, lab work & pertinent test results  Airway Mallampati: II  TM Distance: >3 FB Neck ROM: full    Dental  (+) Dental Advisory Given, Edentulous Upper, Poor Dentition,    Pulmonary neg pulmonary ROS, COPD, former smoker,  Recent smoker with cough. CXR abnormal both lung bases but patient says his symptoms are not changed from normal.  Probably chronic changes.   Pulmonary exam normal breath sounds clear to auscultation       Cardiovascular Exercise Tolerance: Good negative cardio ROS Normal cardiovascular exam Rhythm:regular Rate:Normal     Neuro/Psych negative neurological ROS  negative psych ROS   GI/Hepatic negative GI ROS, Neg liver ROS,   Endo/Other  negative endocrine ROS  Renal/GU negative Renal ROS  negative genitourinary   Musculoskeletal negative musculoskeletal ROS (+)   Abdominal   Peds  Hematology negative hematology ROS (+)   Anesthesia Other Findings   Reproductive/Obstetrics negative OB ROS                         Anesthesia Physical Anesthesia Plan  ASA: III  Anesthesia Plan: General   Post-op Pain Management: GA combined w/ Regional for post-op pain   Induction: Intravenous  Airway Management Planned: LMA  Additional Equipment:   Intra-op Plan:   Post-operative Plan:   Informed Consent: I have reviewed the patients History and Physical, chart, labs and discussed the procedure including the risks, benefits and alternatives for the proposed anesthesia with the patient or authorized representative who has indicated his/her understanding and acceptance.   Dental Advisory Given  Plan Discussed with: CRNA and Surgeon  Anesthesia Plan Comments:       Anesthesia Quick Evaluation

## 2015-12-17 NOTE — Op Note (Signed)
OPERATIVE NOTE- INGUINAL HERNIA REPAIR  Preoperative diagnosis:  Large left inguinal hernia (extending into scrotum)  Postoperative diagnosis:  Same  Procedure:   Left inguinal hernia repair with mesh.  Surgeon:  Avel Peaceodd Toniqua Melamed, M.D.  Anesthesia:  General/LMA, TAP block (per anesthesia), local (Marcaine).  Indication:  This is a 65 year old male with a large left inguinal hernia extending into his scrotum.  He now presents for repair.  Technique:  He was seen in the holding room and the left groin was marked with my initials. He was brought to the operating, placed supine on the operating table, and the anesthetic was administered by the anesthesiologist. The hair in the left groin area was clipped as was felt to be necessary. This area was then sterilely prepped and draped.  Local anesthetic was infiltrated in the superficial and deep tissues in the left groin.  An incision was made through the skin and subcutaneous tissue until the external oblique aponeurosis was identified.  Local anesthetic was infiltrated deep to the external oblique aponeurosis. The external oblique aponeurosis was divided through the external ring medially and back toward the anterior superior iliac spine laterally. Using blunt dissection, the shelving edge of the inguinal ligament was identified inferiorly and the internal oblique aponeurosis and muscle were identified superiorly. The ilioinguinal nerve was identified and preserved.  A large indirect hernia sac densely adherent to the spermatic cord and extending down into the scrotum was identified.  Using blunt dissection and electrocautery the sac was separated from the spermatic cord.  The sac was entered and small bowel was reduced back into the peritoneal cavity.  High ligation of the sac was performed with a 2-0 silk suture.  Excess sac was removed.  The remaining sac was reduced through a large, indirect defect.  A penrose drain was placed around the spermatic  cord contents.   A piece of 3" x 6" polypropylene mesh was brought into the field and anchored 1-2 cm medial to the pubic tubercle with 2-0 Prolene suture. The inferior aspect of the mesh was anchored to the shelving edge of the inguinal ligament with running 2-0 Prolene suture to a level 1-2 cm lateral to the internal ring. A slit was cut in the mesh creating 2 tails. These were wrapped around the spermatic cord. The superior aspect of the mesh was anchored to the internal oblique aponeurosis and muscle with interrupted 2-0 Vicryl sutures. The 2 tails of the mesh were then crossed creating a new internal ring and were anchored to the shelving edge of the inguinal ligament with 2-0 Prolene suture. The tip of a hemostat could be placed through the new aperture. The lateral aspect of the mesh was then tucked deep to the external oblique aponeurosis.  The wound was inspected and hemostasis was adequate. The external oblique aponeurosis was then closed over the mesh and cord with running 3-0 Vicryl suture. The subcutaneous tissue was closed with running 3-0 Vicryl suture. The skin closed with a running 4-0 Monocryl subcuticular stitch.  Steri-Strips and a sterile dressing were applied.  The procedure was well-tolerated without any apparent complications and he was taken to the recovery room in satisfactory condition.

## 2015-12-17 NOTE — Interval H&P Note (Signed)
History and Physical Interval Note:  12/17/2015 12:49 PM  Larry Meadows  has presented today for surgery, with the diagnosis of Left inguinal hernia   The various methods of treatment have been discussed with the patient and family. After consideration of risks, benefits and other options for treatment, the patient has consented to  Procedure(s): OPEN HERNIA REPAIR LEFT  INGUINAL ADULT WITH MESH  (Left) INSERTION OF MESH (Left) as a surgical intervention .  The patient's history has been reviewed, patient examined, no change in status, stable for surgery.  I have reviewed the patient's chart and labs.  Questions were answered to the patient's satisfaction.     Chellsea Beckers JShela Commons

## 2015-12-17 NOTE — H&P (View-Only) (Signed)
Larry Meadows 11/28/2015 10:48 AM Location: [facility] Patient #: [id number] DOB: 09/17/1950 Single / Language: [place] / Race: Black or African American Male  History of Present Illness [name](Jerrard Bradburn J. Arvie Bartholomew MD; 11/28/2015 11:17 AM) The patient is a 65 year old male.   Note:He is referred by Dr. Ranae PalmsYelverton for consultation regarding a large left inguinal hernia. He states he has had the hernia for about 2 years and has not changed in size. Occasionally it causes him some discomfort. He states in the morning the area is flat but when he gets up and walks around swelling goes down into his left scrotal area. He denies any difficulty with urination or chronic constipation. He denies family history of hernia. His 2 sisters are here with him.  He states he has otherwise healthy and is not being treated for any major medical problems. He stays with one of his sisters.  Past Surgical History Fay Records(Ashley Beck, CMA; 11/28/2015 10:48 AM) No pertinent past surgical history  Diagnostic Studies History Fay Records(Ashley Beck, CMA; 11/28/2015 10:48 AM) Colonoscopy never  Allergies Fay Records(Ashley Beck, CMA; 11/28/2015 10:49 AM) No Known Drug Allergies 11/28/2015  Medication History Fay Records(Ashley Beck, CMA; 11/28/2015 10:49 AM) No Current Medications Medications Reconciled  Social History Fay Records(Ashley Beck, CMA; 11/28/2015 10:48 AM) Alcohol use Occasional alcohol use. Caffeine use Coffee. Tobacco use Current every day smoker.  Family History Fay Records(Ashley Beck, [place]; 11/28/2015 10:48 AM) Heart Disease Mother. Ovarian Cancer Sister.     Review of Systems Fay Records(Ashley Beck CMA; 11/28/2015 10:48 AM) General Not Present- Appetite Loss, Chills, Fatigue, Fever, Night Sweats, Weight Gain and Weight Loss. Skin Not Present- Change in Wart/Mole, Dryness, Hives, Jaundice, New Lesions, Non-Healing Wounds, Rash and Ulcer. HEENT Not Present- Earache, Hearing Loss, Hoarseness, Nose Bleed, Oral Ulcers, Ringing in the Ears,  Seasonal Allergies, Sinus Pain, Sore Throat, Visual Disturbances, Wears glasses/contact lenses and Yellow Eyes. Respiratory Not Present- Bloody sputum, Chronic Cough, Difficulty Breathing, Snoring and Wheezing. Breast Not Present- Breast Mass, Breast Pain, Nipple Discharge and Skin Changes. Cardiovascular Not Present- Chest Pain, Difficulty Breathing Lying Down, Leg Cramps, Palpitations, Rapid Heart Rate, Shortness of Breath and Swelling of Extremities. Gastrointestinal Not Present- Abdominal Pain, Bloating, Bloody Stool, Change in Bowel Habits, Chronic diarrhea, Constipation, Difficulty Swallowing, Excessive gas, Gets full quickly at meals, Hemorrhoids, Indigestion, Nausea, Rectal Pain and Vomiting. Male Genitourinary Not Present- Blood in Urine, Change in Urinary Stream, Frequency, Impotence, Nocturia, Painful Urination, Urgency and Urine Leakage. Neurological Not Present- Decreased Memory, Fainting, Headaches, Numbness, Seizures, Tingling, Tremor, Trouble walking and Weakness. Psychiatric Not Present- Anxiety, Bipolar, Change in Sleep Pattern, Depression, Fearful and Frequent crying. Endocrine Not Present- Cold Intolerance, Excessive Hunger, Hair Changes, Heat Intolerance, Hot flashes and New Diabetes. Hematology Not Present- Easy Bruising, Excessive bleeding, Gland problems, HIV and Persistent Infections.  Vitals Fay Records(Ashley Beck CMA; 11/28/2015 10:49 AM) 11/28/2015 10:49 AM Weight: 135 lb Height: 67in Body Surface Area: 1.71 m Body Mass Index: 21.14 kg/m  Temp.: 98.70F  Pulse: 74 (Regular)  BP: 130/70 (Sitting, Left Arm, Standard)      Physical Exam [name](Nakaiya Beddow J. Akima Slaugh MD; 11/28/2015 11:18 AM)  The physical exam findings are as follows: Note:General: Thin male in NAD. Pleasant and cooperative.  EYES: no icterus  CV: RRR, no murmur, no JVD.  CHEST: Breath sounds equal and clear. Respirations nonlabored.  ABDOMEN: Soft, nontender, no hernias.  GU: There is a large left  inguinal bulge that extends into the distal scrotum. It is reducible in the supine position. There is a very  small right inguinal bulge that reduces easily in the supine position.  NEUROLOGIC: Alert and oriented, answers questions appropriately.  PSYCHIATRIC: Normal mood, affect , and behavior.    Assessment & Plan [name] MD; 11/28/2015 11:15 AM)  NON-RECURRENT BILATERAL INGUINAL HERNIA WITHOUT OBSTRUCTION OR GANGRENE (K40.20) Impression: Left-sided hernia is a large scrotal hernia and he is interested in repair. Right sided hernia is asymptomatic and very small. He only wants to fix the left side. Given that the right side is asymptomatic, I think this is okay.  Plan: Outpatient open left inguinal hernia repair with mesh. I have explained the procedure, risks, and aftercare of inguinal hernia repair to him and his sisters. Risks include but are not limited to bleeding, infection, wound problems, anesthesia, recurrence, bladder or intestine injury, urinary retention, testicular dysfunction, chronic pain, mesh problems. He seems to understand and agrees to proceed.  Avel Peace, MD

## 2015-12-17 NOTE — Transfer of Care (Signed)
Immediate Anesthesia Transfer of Care Note  Patient: Gareth MorganRicky L Hamelin  Procedure(s) Performed: Procedure(s): OPEN HERNIA REPAIR LEFT  INGUINAL ADULT WITH MESH  (Left) INSERTION OF MESH (Left)  Patient Location: PACU  Anesthesia Type:General  Level of Consciousness: awake, alert , oriented and patient cooperative  Airway & Oxygen Therapy: Patient Spontanous Breathing and Patient connected to nasal cannula oxygen  Post-op Assessment: Report given to RN and Post -op Vital signs reviewed and stable  Post vital signs: Reviewed and stable  Last Vitals:  Filed Vitals:   12/17/15 0754  BP: 108/68  Pulse: 68  Temp: 36.8 C  Resp: 16    Last Pain: There were no vitals filed for this visit.    Patients Stated Pain Goal: 5 (12/17/15 0746)  Complications: No apparent anesthesia complications

## 2015-12-17 NOTE — Anesthesia Postprocedure Evaluation (Signed)
Anesthesia Post Note  Patient: Larry MorganRicky L Meadows  Procedure(s) Performed: Procedure(s) (LRB): OPEN HERNIA REPAIR LEFT  INGUINAL ADULT WITH MESH  (Left) INSERTION OF MESH (Left)  Patient location during evaluation: PACU Anesthesia Type: General Level of consciousness: awake and alert Pain management: pain level controlled Vital Signs Assessment: post-procedure vital signs reviewed and stable Respiratory status: spontaneous breathing, nonlabored ventilation, respiratory function stable and patient connected to nasal cannula oxygen Cardiovascular status: blood pressure returned to baseline and stable Postop Assessment: no signs of nausea or vomiting Anesthetic complications: no    Last Vitals:  Filed Vitals:   12/17/15 1200 12/17/15 1215  BP: 129/84   Pulse: 59 66  Temp:    Resp: 16 15    Last Pain:  Filed Vitals:   12/17/15 1218  PainSc: 4                  Silver Parkey L

## 2015-12-18 ENCOUNTER — Encounter (HOSPITAL_BASED_OUTPATIENT_CLINIC_OR_DEPARTMENT_OTHER): Payer: Self-pay | Admitting: General Surgery

## 2016-04-22 ENCOUNTER — Emergency Department: Admit: 2016-04-22 | Payer: Charity | Primary: Internal Medicine

## 2016-04-22 ENCOUNTER — Inpatient Hospital Stay
Admit: 2016-04-22 | Discharge: 2016-05-01 | Disposition: A | Discharge status: Home / Self Care | Payer: Charity | Attending: Internal Medicine | Admitting: Internal Medicine

## 2016-04-22 ENCOUNTER — Inpatient Hospital Stay: Payer: Charity | Primary: Internal Medicine

## 2016-04-22 DIAGNOSIS — J440 Chronic obstructive pulmonary disease with acute lower respiratory infection: Secondary | ICD-10-CM

## 2016-04-22 LAB — CBC WITH AUTOMATED DIFF
ABS. BASOPHILS: 0 10*3/uL (ref 0.0–0.1)
ABS. EOSINOPHILS: 0 10*3/uL (ref 0.0–0.4)
ABS. LYMPHOCYTES: 2.2 10*3/uL (ref 0.8–3.5)
ABS. MONOCYTES: 0.5 10*3/uL (ref 0.0–1.0)
ABS. NEUTROPHILS: 5.5 10*3/uL (ref 1.8–8.0)
BASOPHILS: 0 % (ref 0–1)
EOSINOPHILS: 1 % (ref 0–7)
HCT: 40.6 % (ref 36.6–50.3)
HGB: 14.7 g/dL (ref 12.1–17.0)
LYMPHOCYTES: 26 % (ref 12–49)
MCH: 30.6 PG (ref 26.0–34.0)
MCHC: 36.2 g/dL (ref 30.0–36.5)
MCV: 84.6 FL (ref 80.0–99.0)
MONOCYTES: 6 % (ref 5–13)
NEUTROPHILS: 67 % (ref 32–75)
PLATELET: 289 10*3/uL (ref 150–400)
RBC: 4.8 M/uL (ref 4.10–5.70)
RDW: 13.6 % (ref 11.5–14.5)
WBC: 8.2 10*3/uL (ref 4.1–11.1)

## 2016-04-22 LAB — TROPONIN I: Troponin-I, Qt.: <0.04 ng/mL (ref <0.05)

## 2016-04-22 LAB — METABOLIC PANEL, COMPREHENSIVE
A-G Ratio: 0.7 — ABNORMAL LOW (ref 1.1–2.2)
ALT (SGPT): 13 U/L (ref 12–78)
AST (SGOT): 24 U/L (ref 15–37)
Albumin: 3.3 g/dL — ABNORMAL LOW (ref 3.5–5.0)
Alk. phosphatase: 96 U/L (ref 45–117)
Anion gap: 8 mmol/L (ref 5–15)
BUN/Creatinine ratio: 22 — ABNORMAL HIGH (ref 12–20)
BUN: 13 MG/DL (ref 6–20)
Bilirubin, total: 0.4 MG/DL (ref 0.2–1.0)
CO2: 23 mmol/L (ref 21–32)
Calcium: 10.2 MG/DL — ABNORMAL HIGH (ref 8.5–10.1)
Chloride: 100 mmol/L (ref 97–108)
Creatinine: 0.59 MG/DL — ABNORMAL LOW (ref 0.70–1.30)
GFR est AA: >60 mL/min/{1.73_m2} (ref >60)
GFR est non-AA: >60 mL/min/{1.73_m2} (ref >60)
Globulin: 4.5 g/dL — ABNORMAL HIGH (ref 2.0–4.0)
Glucose: 109 mg/dL — ABNORMAL HIGH (ref 65–100)
Potassium: 4.3 mmol/L (ref 3.5–5.1)
Protein, total: 7.8 g/dL (ref 6.4–8.2)
Sodium: 131 mmol/L — ABNORMAL LOW (ref 136–145)

## 2016-04-22 LAB — CK W/ CKMB & INDEX
CK - MB: <1 NG/ML (ref <3.6)
CK: 94 U/L (ref 39–308)

## 2016-04-22 LAB — LIPASE: Lipase: 100 U/L (ref 73–393)

## 2016-04-22 MED ORDER — SODIUM CHLORIDE 0.9% BOLUS IV
0.9 % | Freq: Once | INTRAVENOUS | Status: AC
Start: 2016-04-22 — End: 2016-04-22
  Administered 2016-04-22: 23:00:00 via INTRAVENOUS

## 2016-04-22 MED ORDER — SODIUM CHLORIDE 0.9 % IJ SYRG
Freq: Once | INTRAMUSCULAR | Status: AC
Start: 2016-04-22 — End: 2016-04-22
  Administered 2016-04-22: 23:00:00 via INTRAVENOUS

## 2016-04-22 MED ORDER — IOPAMIDOL 76 % IV SOLN
370 mg iodine /mL (76 %) | Freq: Once | INTRAVENOUS | Status: AC
Start: 2016-04-22 — End: 2016-04-22
  Administered 2016-04-22: 23:00:00 via INTRAVENOUS

## 2016-04-22 MED ORDER — HYDROMORPHONE (PF) 1 MG/ML IJ SOLN
1 mg/mL | INTRAMUSCULAR | Status: DC
Start: 2016-04-22 — End: 2016-04-22
  Administered 2016-04-22: 23:00:00 via INTRAVENOUS

## 2016-04-22 MED ORDER — ONDANSETRON (PF) 4 MG/2 ML INJECTION
4 mg/2 mL | INTRAMUSCULAR | Status: AC
Start: 2016-04-22 — End: 2016-04-22
  Administered 2016-04-22: 23:00:00 via INTRAVENOUS

## 2016-04-22 MED FILL — ONDANSETRON (PF) 4 MG/2 ML INJECTION: 4 mg/2 mL | INTRAMUSCULAR | Qty: 2

## 2016-04-22 MED FILL — NORMAL SALINE FLUSH 0.9 % INJECTION SYRINGE: INTRAMUSCULAR | Qty: 10

## 2016-04-22 MED FILL — ISOVUE-370  76 % INTRAVENOUS SOLUTION: 370 mg iodine /mL (76 %) | INTRAVENOUS | Qty: 100

## 2016-04-22 MED FILL — SODIUM CHLORIDE 0.9 % IV: INTRAVENOUS | Qty: 1000

## 2016-04-22 MED FILL — SODIUM CHLORIDE 0.9 % IV: INTRAVENOUS | Qty: 100

## 2016-04-22 NOTE — ED Provider Notes (Addendum)
HPI Comments: Patient is a 65 year old make who is brought the the ED by a family member with complaints of abdominal pain, chest pain, nausea, vomiting & constipation for 4-5 days. States he has had RUQ abdominal pain with associated nausea and vomiting for the last 4-5 days. States the vomiting initially started as food then turned to yellow. Denies and bloody or coffee ground emesis. States his pain in his RUQ improves after vomiting. Also has associated chills, mild cough and constipation. Had a pneumonia in May post-op after a hernia repair. Has tried epsom salt & water for his constipation without any relief. When probed states he has lost an unknown amount of weight recently. (Patient looks thin). He has no further complaints at this time.     PCP: None    No current facility-administered medications on file prior to encounter.   No current outpatient prescriptions on file prior to encounter.    No Known Allergies    Social:   No alcohol  History of tobacco user      Patient is a 65 y.o. male presenting with chest pain and abdominal pain. The history is provided by the patient.   Chest Pain (Angina)    This is a new problem. The current episode started more than 2 days ago. The problem has been gradually worsening. The pain is present in the lateral region and right side. The pain is at a severity of 9/10. The quality of the pain is described as dull. The pain does not radiate. The symptoms are aggravated by movement and deep breathing. Associated symptoms include abdominal pain, cough, dizziness, malaise/fatigue, nausea, numbness and vomiting. Pertinent negatives include no back pain, no claudication, no diaphoresis, no exertional chest pressure, no fever, no headaches, no hemoptysis, no irregular heartbeat, no leg pain, no lower extremity edema, no near-syncope, no orthopnea, no palpitations, no PND, no shortness of breath, no sputum production and no weakness. He has tried  nothing for the symptoms. The treatment provided no relief.   Abdominal Pain    This is a new problem. The current episode started more than 2 days ago. The problem occurs constantly. The problem has not changed since onset.The pain is associated with an unknown factor. The pain is located in the RUQ. The pain is at a severity of 9/10. The pain is severe. Associated symptoms include nausea, vomiting, constipation and chest pain. Pertinent negatives include no anorexia, no fever, no belching, no diarrhea, no flatus, no hematochezia, no melena, no frequency, no hematuria, no headaches, no arthralgias, no myalgias, no testicular pain and no back pain. Nothing worsens the pain. The pain is relieved by vomiting. Inguinal hernia repair in May 2017       History reviewed. No pertinent past medical history.    Past Surgical History:   Procedure Laterality Date   ??? HX HERNIA REPAIR           History reviewed. No pertinent family history.    Social History     Social History   ??? Marital status: SINGLE     Spouse name: N/A   ??? Number of children: N/A   ??? Years of education: N/A     Occupational History   ??? Not on file.     Social History Main Topics   ??? Smoking status: Not on file   ??? Smokeless tobacco: Not on file   ??? Alcohol use Not on file   ??? Drug use: Not on file   ???  Sexual activity: Not on file     Other Topics Concern   ??? Not on file     Social History Narrative   ??? No narrative on file         ALLERGIES: Review of patient's allergies indicates no known allergies.    Review of Systems   Constitutional: Positive for activity change, appetite change, chills and malaise/fatigue. Negative for diaphoresis and fever.   HENT: Negative for congestion and drooling.    Eyes: Negative.    Respiratory: Positive for cough. Negative for hemoptysis, sputum production, chest tightness, shortness of breath and wheezing.    Cardiovascular: Positive for chest pain. Negative for palpitations,  orthopnea, claudication, leg swelling, PND and near-syncope.   Gastrointestinal: Positive for abdominal pain, constipation, nausea and vomiting. Negative for anal bleeding, anorexia, blood in stool, diarrhea, flatus, hematochezia, melena and rectal pain.   Endocrine: Negative.    Genitourinary: Negative for frequency, hematuria and testicular pain.   Musculoskeletal: Negative for arthralgias, back pain, joint swelling and myalgias.   Skin: Negative.    Allergic/Immunologic: Negative.    Neurological: Positive for dizziness and numbness. Negative for tremors, syncope, speech difficulty, weakness and headaches.   Hematological: Negative.    Psychiatric/Behavioral: Negative.        Vitals:    04/22/16 1546 04/22/16 1754   BP: (!) 88/69 91/67   Pulse: (!) 104 95   Resp: 16 16   Temp: 98.4 ??F (36.9 ??C)    SpO2: 93% 94%   Weight: 55.1 kg (121 lb 6 oz)    Height: 5' 6" (1.676 m)             Physical Exam   Constitutional: He is oriented to person, place, and time. He appears well-developed. He appears ill. No distress.   Very thin but not cachectic   HENT:   Head: Normocephalic and atraumatic.   Neck: Trachea normal, normal range of motion and phonation normal. Neck supple.   Cardiovascular: Normal rate, regular rhythm and normal heart sounds.    Pulses:       Radial pulses are 2+ on the right side, and 2+ on the left side.        Dorsalis pedis pulses are 2+ on the right side, and 2+ on the left side.   Pulmonary/Chest: Effort normal. No accessory muscle usage. No respiratory distress. He has decreased breath sounds in the right middle field and the right lower field. He has no wheezes. He has no rhonchi. He has no rales.         Chest wall is dull to percussion. He exhibits no mass, no tenderness, no bony tenderness, no laceration, no crepitus, no edema, no deformity, no swelling and no retraction.   Abdominal: Soft. Normal appearance and bowel sounds are normal. He  exhibits no mass. There is tenderness in the right upper quadrant. There is no rigidity, no rebound, no guarding, no CVA tenderness and negative Murphy's sign.       Musculoskeletal: Normal range of motion.   Neurological: He is alert and oriented to person, place, and time.   Skin: Skin is warm and dry. He is not diaphoretic.   Psychiatric: He has a normal mood and affect. His behavior is normal. Judgment and thought content normal.   Nursing note and vitals reviewed.       MDM  Number of Diagnoses or Management Options  Diagnosis management comments: Assessment & Plan:   EKG  Cardiac enzymes  UA  CMP  Lipase  CBC with DIFF  NS bolus  CXR port  CT Abd/pelvis with IV contrast    Zofran 22m IV    Discussed with Dr. JYancey Flemings MD      Consult to Urology (Judye Bos due to CT findings of bladder outlet obs, enlarged prostate & Left hydroureteronephrosis.     Levoquin for PNA seen on CT abd/pelvis    RDaron Offer NP  04/22/16  8:00 PM    Urology called back. Will see him in the morning on rounds    Consult hospitalist for admission.     RDaron Offer NP  04/22/16  8:16 PM      8:28 PM  Patient is being admitted to the hospital.  The results of their tests and reasons for their admission have been discussed with them and/or available family.  They convey agreement and understanding for the need to be admitted and for their admission diagnosis.  Consultation has been made with the inpatient physician specialist for hospitalization.    LABORATORY TESTS:  Recent Results (from the past 12 hour(s))  -EKG, 12 LEAD, INITIAL  Collection Time: 04/22/16  4:14 PM       Result                                            Value                         Ref Range                       Ventricular Rate                                  94                            BPM                             Atrial Rate                                       94                            BPM                              P-R Interval                                      176                           ms                              QRS Duration  84                            ms                              Q-T Interval                                      340                           ms                              QTC Calculation (Bezet)                           425                           ms                              Calculated P Axis                                 84                            degrees                         Calculated R Axis                                 152                           degrees                         Calculated T Axis                                 63                            degrees                         Diagnosis  Normal sinus rhythm No previous ECGs available   -CBC WITH AUTOMATED DIFF  Collection Time: 04/22/16  4:17 PM       Result                                            Value                         Ref Range                       WBC                                               8.2                           4.1 - 11.1 K/uL                 RBC                                               4.80                          4.10 - 5.70 M/uL                HGB                                               14.7                          12.1 - 17.0 g/dL                HCT                                               40.6                          36.6 - 50.3 %                   MCV                                               84.6                          80.0 - 99.0 FL                  MCH  30.6                          26.0 - 34.0 PG                  MCHC                                              36.2                          30.0 - 36.5 g/dL                 RDW                                               13.6                          11.5 - 14.5 %                   PLATELET                                          289                           150 - 400 K/uL                  NEUTROPHILS                                       67                            32 - 75 %                       LYMPHOCYTES                                       26                            12 - 49 %                       MONOCYTES                                         6                             5 - 13 %  EOSINOPHILS                                       1                             0 - 7 %                         BASOPHILS                                         0                             0 - 1 %                         ABS. NEUTROPHILS                                  5.5                           1.8 - 8.0 K/UL                  ABS. LYMPHOCYTES                                  2.2                           0.8 - 3.5 K/UL                  ABS. MONOCYTES                                    0.5                           0.0 - 1.0 K/UL                  ABS. EOSINOPHILS                                  0.0                           0.0 - 0.4 K/UL                  ABS. BASOPHILS                                    0.0                           0.0 - 0.1 K/UL             -  METABOLIC PANEL, COMPREHENSIVE  Collection Time: 04/22/16  4:17 PM       Result                                            Value                         Ref Range                       Sodium                                            131 (L)                       136 - 145 mmol/L                Potassium                                         4.3                           3.5 - 5.1 mmol/L                Chloride                                          100                           97 - 108 mmol/L                 CO2                                               23                             21 - 32 mmol/L                  Anion gap                                         8                             5 - 15 mmol/L                   Glucose  109 (H)                       65 - 100 mg/dL                  BUN                                               13                            6 - 20 MG/DL                    Creatinine                                        0.59 (L)                      0.70 - 1.30 MG/DL               BUN/Creatinine ratio                              22 (H)                        12 - 20                         GFR est AA                                        >60                           >60 ml/min/1.23m               GFR est non-AA                                    >60                           >60 ml/min/1.73m              Calcium                                           10.2 (H)                      8.5 - 10.1 MG/DL                Bilirubin, total                                  0.4  0.2 - 1.0 MG/DL                 ALT (SGPT)                                        13                            12 - 78 U/L                     AST (SGOT)                                        24                            15 - 37 U/L                     Alk. phosphatase                                  96                            45 - 117 U/L                    Protein, total                                    7.8                           6.4 - 8.2 g/dL                  Albumin                                           3.3 (L)                       3.5 - 5.0 g/dL                  Globulin                                          4.5 (H)                       2.0 - 4.0 g/dL                  A-G Ratio                                         0.7 (L)  1.1 - 2.2                  -TROPONIN I  Collection Time: 04/22/16  4:17 PM        Result                                            Value                         Ref Range                       Troponin-I, Qt.                                   <0.04                         <0.05 ng/mL                -CK W/ CKMB & INDEX  Collection Time: 04/22/16  4:17 PM       Result                                            Value                         Ref Range                       CK                                                94                            39 - 308 U/L                    CK - MB                                           <1.0                          <3.6 NG/ML                      CK-MB Index                                       Cannot be calculated          0 - 2.5                    -LIPASE  Collection Time: 04/22/16  4:17 PM       Result  Value                         Ref Range                       Lipase                                            100                           73 - 393 U/L                 IMAGING RESULTS:  CT ABD PELV W CONT   Final Result     XR CHEST PORT   Final Result     Ct Abd Pelv W Cont    Result Date: 04/22/2016  EXAM:  CT ABD PELV W CONT INDICATION: Mid abdominal pain for 4 days, nausea, vomiting COMPARISON: None CONTRAST:  100 mL of Isovue-370. TECHNIQUE: Following the uneventful intravenous administration of contrast, thin axial images were obtained through the abdomen and pelvis. Coronal and sagittal reconstructions were generated. Oral contrast was not administered. CT dose reduction was achieved through use of a standardized protocol tailored for this examination and automatic exposure control for dose modulation. FINDINGS: LUNG BASES: Bronchiectasis is seen throughout the visualized lung fields. Confluent parenchymal opacities are noted in the lower lobes, with a reference 19 mm lesion in the left lower lobe. Consolidation is noted in the right lower lobe. INCIDENTALLY IMAGED HEART  AND MEDIASTINUM: Unremarkable. LIVER: No mass or biliary dilatation. GALLBLADDER: Unremarkable. SPLEEN: No mass. PANCREAS: No mass or ductal dilatation. ADRENALS: Unremarkable. KIDNEYS: Moderate left hydroureteronephrosis is noted to the mid ureter of indeterminate etiology. STOMACH: Unremarkable. SMALL BOWEL: No dilatation or wall thickening. COLON: No dilatation or wall thickening. APPENDIX: Unremarkable. PERITONEUM: No ascites or pneumoperitoneum. RETROPERITONEUM: No lymphadenopathy or aortic aneurysm. REPRODUCTIVE ORGANS: The prostate is markedly enlarged and quite heterogeneous in appearance, with a maximum transverse dimension of 5.9 cm. URINARY BLADDER: Bladder wall thickening is noted likely related to bladder outlet obstruction. BONES: Degenerative changes are seen in the left hip and lumbar spine. ADDITIONAL COMMENTS: N/A     IMPRESSION: Marked heterogeneous enlargement of the prostate gland. Moderate left hydroureteronephrosis to the level of the mid ureter, of indeterminate etiology. Diffuse bronchiectasis. Consolidation is noted in the right lower lobe. Parenchymal opacities are noted in the lower lobes bilaterally, and neoplasm cannot be excluded. Consider bronchoscopy for further evaluation.     Xr Chest Port    Result Date: 04/22/2016  Indication: Mid chest pain and left arm numbness Comparison: None Portable exam of the chest obtained at 1821 demonstrates normal heart size. There is a diffuse reticular-nodular pattern. Focal infiltrates are noted at the left lung base and right middle lobe. The osseous structures are unremarkable.     Impression: Bilateral pulmonary infiltrates. Diffuse reticular-nodular pattern may represent an acute abnormality or the patient's baseline. Follow-up is recommended.        MEDICATIONS GIVEN:  Medications  levoFLOXacin (LEVAQUIN) 750 mg in D5W IVPB (not administered)  ondansetron (ZOFRAN) injection 4 mg (4 mg IntraVENous Given 04/22/16 1839)   sodium chloride 0.9 % bolus infusion 1,000 mL (1,000 mL IntraVENous New Bag 04/22/16 1839)  sodium chloride 0.9 %  bolus infusion 100 mL (0 mL IntraVENous IV Completed 04/22/16 2005)  iopamidol (ISOVUE-370) 76 % injection 100 mL (100 mL IntraVENous Given 04/22/16 1923)  sodium chloride (NS) flush 10 mL (10 mL IntraVENous Given 04/22/16 1923)    IMPRESSION:  No diagnosis found.    PLAN:  1. Admit to hospitalist    Total critical care time spent exclusive of procedures:  49 minutes      Daron Offer, NP           Amount and/or Complexity of Data Reviewed  Clinical lab tests: ordered and reviewed  Tests in the radiology section of CPT??: ordered    Critical Care  Total time providing critical care: 30-74 minutes    Patient Progress  Patient progress: stable    ED Course       Procedures

## 2016-04-22 NOTE — ED Triage Notes (Signed)
Mid chest pain, mid abd pain and left arm numbness x 4 days, no appetite, denies fever, +n/v, denies sob, denies diarrhea or constipation

## 2016-04-22 NOTE — ED Notes (Signed)
TRANSFER - OUT REPORT:    Verbal report given to Bernita(name) on Bruce Clark  being transferred to 532(unit) for routine progression of care       Report consisted of patient???s Situation, Background, Assessment and   Recommendations(SBAR).     Information from the following report(s) SBAR, Kardex, ED Summary, [facility] and Recent Results was reviewed with the receiving nurse.    Lines:   Peripheral IV 04/22/16 Right Arm (Active)       Peripheral IV 04/22/16 Right;Upper Arm (Active)        Opportunity for questions and clarification was provided.      Patient transported with:

## 2016-04-22 NOTE — ED Notes (Signed)
Attempted report, RN will call back

## 2016-04-22 NOTE — Progress Notes (Signed)
Admission Medication Reconciliation:    Information obtained from: Patient and patient's visitor    Significant PMH/Disease States:   History reviewed. No pertinent past medical history.    Chief Complaint for this Admission:    Chief Complaint   Patient presents with   ??? Chest Pain   ??? Abdominal Pain   ??? Numbness         Allergies:  Review of patient's allergies indicates no known allergies.    Prior to Admission Medications:   Prior to Admission Medications   Prescriptions Last Dose Informant Patient Reported? Taking?   naproxen sodium (ALEVE) 220 mg tablet 04/21/2016 at Unknown time  Yes Yes   Sig: Take 220 mg by mouth daily as needed.      Facility-Administered Medications: None         Comments/Recommendations:   Reviewed medications and allergies with pt. Added Aleve '200mg'$  tablets as needed for pain. No other medication changes, additions, or removals.      Renea Ee, PharmD Candidate [phone]

## 2016-04-22 NOTE — Progress Notes (Addendum)
TRANSFER - IN REPORT:    Verbal report received from Foster RN(name) on Bruce Clark  being received from ED(unit) for routine progression of care      Report consisted of patient???s Situation, Background, Assessment and   Recommendations(SBAR).     Information from the following report(s) SBAR, Kardex, ED Summary, Intake/Output, MAR and Recent Results was reviewed with the receiving nurse.    Opportunity for questions and clarification was provided.      Assessment completed upon patient???s arrival to unit and care assumed.   Dr. Carlyon Prows called unit to notify writer of pt's diagnosis of DVT and that lovenox ordered to be given asap. Will continue to monitor. Pt denies pain at this time.   2778 CT called to follow-up on ordered CT exam. Pt prepared to go down. Writer told that patient's recent creatinine needed as contrast received on yesterday. Labs pending. Will update them when results are in.  0628 CT notified of pt's creatinine. Per CT personnel, pt will be transported down this a.m.  0830 Pt returned from CT. Dual skin assessment performed by  Ladora Daniel RN and Laurie Panda. Braden score 21. No areas of impairment. Healed Surgical scars noted to RLE and L groin.   0840 Bedside and Verbal shift change report given to [place] (oncoming nurse) by Ladora Daniel RN (offgoing nurse). Report included the following information SBAR, Kardex, Intake/Output, MAR, Recent Results and Cardiac Rhythm NSR.

## 2016-04-22 NOTE — H&P (Signed)
[place] [address]   HISTORY AND PHYSICAL       Name:  Bruce Clark, Bruce Clark   MR#:  [id number]   DOB:  1951-04-10   Account #:  [id number]        Date of Adm:  04/22/2016       CHIEF COMPLAINT: Abdominal pain, nausea and vomiting.    HISTORY OF PRESENT ILLNESS: The patient is a 65 year old   gentleman with past medical history of hernia repair, history of   pneumonia, who presents to the hospital with the above mentioned   symptoms. The history was obtained from the patient, as well as   from the family member who is present at the bedside. The patient has   been having some cough associated with some shortness of breath   that has been going on for 3 weeks. The patient reports that last night   the cough got worse. He also started having some vomiting associated   with nausea, and abdominal pain, especially on the right side. The   patient reports that he has had poor appetite associated with that. He   also reports that he was having pain in his chest, especially on the   right side. The patient got concerned and decided to come to the   hospital. The patient reports that he has also been constipated for the   past 4-5 days, he has been having some trouble moving his bowels.   The patient also reports that his left arm feels numb for the past 3 days   and he is very weak, and it is very weak. He reports that he cannot   make a grip, cannot lift things off the left arm, and that got him   concerned and so he decided to come to the hospital. The patient   also reports that he has a cough which is mildly productive in nature   with yellowish-whitish phlegm. The patient denies any exposure to   tuberculosis. The patient also reports that he had a hernia repair done   in 12/2015 in [place], [place], and then post that he went   home and then developed a pneumonia. He reports that he "took some   tablets for 14 days." The patient denies any other complaints or    problems. The patient does report that he has lost quite a bit of weight,   but cannot quantify the same. The patient denies any other complaints   or problems. Denies any headache, blurry vision, sore throat, trouble   swallowing or trouble with speech. Denies any urinary symptoms,   hematemesis, melena, hemoptysis, hematuria, chills, any falls, injuries,   any exposure to sick contacts or any other concerns or problems.    PAST MEDICAL HISTORY: See above.    HOME MEDICATIONS: Currently the patient is on Naproxen as   needed.    SOCIAL HISTORY: The patient used to be a smoker and used to drink   heavily, but reports he quit about 9 years back. Denies IV drug abuse.    ALLERGIES: NO KNOWN DRUG ALLERGIES.    FAMILY HISTORY: Found to be noncontributory to the present   admission.    REVIEW OF SYSTEMS: All systems reviewed and were found to be is   essentially negative except for the above-mentioned symptoms.    PHYSICAL EXAMINATION   VITALS: Temperature 98.8, pulse 72, respiratory rate 16, blood  pressure , pulse oximetry 95% on room air.   GENERAL: Alert x3, awake, pleasant male, appears to be stated age.   HEENT: Pupils equal and reactive to light. Dry mucous membranes.   Tympanic membranes clear.   NECK: Supple. No meningeal signs.   CHEST: Decreased basal breath sounds, right greater than left, with   scattered coarse breath sounds.   CORONARY: S1, S2 were heard.   ABDOMEN: Soft, tender to palpation in the right upper quadrant. No   rebound, no guarding. Bowel sounds hypoactive.   EXTREMITIES: No clubbing, no cyanosis. Right lower extremity shows   trace edema, left lower extremity shows no edema.   NEUROPSYCHIATRIC: Pleasant mood and affect. Cranial nerves 2-  12 grossly intact. Sensory grossly within normal limits. DTRs 2+.   Strength 4/5 left upper extremity, 5/5 in all other extremities.   SKIN: Warm.    LABORATORY DATA: White count 8.2, hemoglobin 14.7, hematocrit    40.6, platelets 289. Urine shows no signs of infection. Sodium 131,   potassium 4.3, chloride 100, bicarbonate 23, glucose 109, BUN 13,   creatinine 0.59, calcium 10.2, bilirubin total 0.4, ALT 13, AST 24,   alkaline phosphatase 96, lipase 108. CK 94, CK-MB less than 1.    CT of the abdomen and pelvis shows marked heterogeneous   enlargement of prostate gland, moderate left hydroureteronephrosis to   the level of the mid ureter, of indeterminate etiology. Diffuse   bronchiectasis. Consolidation is noted in the right lower lobe,   parenchymal opacities are noted in the lower lobes bilaterally, and   neoplasm not excluded. Consider further intervention.     X-ray of the chest shows bilateral pulmonary infiltrate, diffuse, reticular   nodular pattern with acute abnormality over the patient's baseline.   Followup recommended.     EKG shows normal sinus rhythm.    ASSESSMENT AND PLAN   1. Pneumonia. The patient will be admitted on a telemetry bed. Will   start the patient on broad-spectrum IV antibiotics, IV hydration, close   monitoring, oxygen support, and Legionella antigen. Will continue to   monitor. I am concerned that the patient may have occult or underlying   malignancy, and thus will obtain a pulmonary consult and a CT of the   chest for further deciphering the cause of the same. Will continue to   closely monitor and reassess as needed. May consider further   intervention, imaging and diagnostics if symptoms persist.   2. Left ureterohydronephrosis. Could be secondary to benign prostatic   hypertrophy, versus other etiology, versus metastatic/malignant lesion.   Urology was consulted and they will see the patient in the morning. Will   continue the patient on IV hydration, pain control. Currently the patient   is able to pass urine and does not appear to be obstructed. May   consider emergent intervention including urological assessment if the    patient has problems with voiding. Regular bladder checks have been   ordered. Further intervention will be per hospital course. Will reassess   as needed.   3. Left arm numbness and weakness. Rule out cerebrovascular   accident. There was no workup done in the ER for the same. Will get a   stat CT of the head. Will provide neurovascular checks. Aspirin if no   signs of bleed. Will provide neurovascular checks. Get an MRI of the   brain and continue to closely monitor. May consider getting a   neurology consult if symptoms persist. Will also  get speech, PT, OT   consult. Further intervention will be per hospital course. Will reassess   as needed.   4. Hypercalcemia. Again, the patient was not started on any IV fluids in   the ER. Will start the patient on gentle IV hydration. We will repeat   BMP in the morning. EKG does not show any acute intervention. This   again makes me a little suspicious about occult malignancy, and   thus will rule out the same. Will continue to closely monitor and   reassess as needed. May consider skeletal imaging if any signs of   pulmonary malignancy.   5. Gastrointestinal and deep venous thrombosis prophylaxis. The   patient will be on sequential compression devices.        Toy Baker, MD      MM / Shona.Buffy   D:  04/22/2016   21:28   T:  04/22/2016   22:58   Job #:  [id number]

## 2016-04-22 NOTE — Progress Notes (Signed)
Pt found to have a DVT  Lovenox ordered  CTA chest pending  monitor

## 2016-04-23 ENCOUNTER — Inpatient Hospital Stay: Admit: 2016-04-23 | Payer: Charity | Primary: Internal Medicine

## 2016-04-23 LAB — EKG 12-LEAD
Atrial Rate: 94 {beats}/min
Diagnosis: NORMAL
P Axis: 84 degrees
P-R Interval: 176 ms
Q-T Interval: 340 ms
QRS Duration: 84 ms
QTc Calculation (Bazett): 425 ms
R Axis: 152 degrees
T Axis: 63 degrees
Ventricular Rate: 94 {beats}/min

## 2016-04-23 LAB — BILIRUBIN, CONFIRM: Bilirubin UA, confirm: NEGATIVE

## 2016-04-23 LAB — CBC WITH AUTOMATED DIFF
ABS. BASOPHILS: 0 10*3/uL (ref 0.0–0.1)
ABS. EOSINOPHILS: 0.1 10*3/uL (ref 0.0–0.4)
ABS. LYMPHOCYTES: 2.2 10*3/uL (ref 0.8–3.5)
ABS. MONOCYTES: 0.7 10*3/uL (ref 0.0–1.0)
ABS. NEUTROPHILS: 3.6 10*3/uL (ref 1.8–8.0)
BASOPHILS: 0 % (ref 0–1)
EOSINOPHILS: 2 % (ref 0–7)
HCT: 38.1 % (ref 36.6–50.3)
HGB: 13.4 g/dL (ref 12.1–17.0)
LYMPHOCYTES: 34 % (ref 12–49)
MCH: 30.2 PG (ref 26.0–34.0)
MCHC: 35.2 g/dL (ref 30.0–36.5)
MCV: 86 FL (ref 80.0–99.0)
MONOCYTES: 10 % (ref 5–13)
NEUTROPHILS: 54 % (ref 32–75)
PLATELET: 253 10*3/uL (ref 150–400)
RBC: 4.43 M/uL (ref 4.10–5.70)
RDW: 13.5 % (ref 11.5–14.5)
WBC: 6.6 10*3/uL (ref 4.1–11.1)

## 2016-04-23 LAB — URINALYSIS W/MICROSCOPIC
Bacteria: NEGATIVE /hpf
Blood: NEGATIVE
Glucose: NEGATIVE mg/dL
Leukocyte Esterase: NEGATIVE
Nitrites: NEGATIVE
Protein: NEGATIVE mg/dL
Specific gravity: 1.025 (ref 1.003–1.030)
Urobilinogen: 1 EU/dL (ref 0.2–1.0)
pH (UA): 5.5 (ref 5.0–8.0)

## 2016-04-23 LAB — METABOLIC PANEL, BASIC
Anion gap: 9 mmol/L (ref 5–15)
BUN/Creatinine ratio: 17 (ref 12–20)
BUN: 10 MG/DL (ref 6–20)
CO2: 23 mmol/L (ref 21–32)
Calcium: 9.5 MG/DL (ref 8.5–10.1)
Chloride: 103 mmol/L (ref 97–108)
Creatinine: 0.59 MG/DL — ABNORMAL LOW (ref 0.70–1.30)
GFR est AA: >60 mL/min/{1.73_m2} (ref >60)
GFR est non-AA: >60 mL/min/{1.73_m2} (ref >60)
Glucose: 71 mg/dL (ref 65–100)
Potassium: 4.1 mmol/L (ref 3.5–5.1)
Sodium: 135 mmol/L — ABNORMAL LOW (ref 136–145)

## 2016-04-23 LAB — EKG, 12 LEAD, INITIAL
Atrial Rate: 94 {beats}/min
Calculated P Axis: 84 degrees
Calculated R Axis: 152 degrees
Calculated T Axis: 63 degrees
Diagnosis: NORMAL
P-R Interval: 176 ms
Q-T Interval: 340 ms
QRS Duration: 84 ms
QTC Calculation (Bezet): 425 ms
Ventricular Rate: 94 {beats}/min

## 2016-04-23 LAB — GLUCOSE, POC
Glucose (POC): 117 mg/dL — ABNORMAL HIGH (ref 65–100)
Glucose (POC): 104 mg/dL — ABNORMAL HIGH (ref 65–100)

## 2016-04-23 LAB — LIPID PANEL
CHOL/HDL Ratio: 2.4 (ref 0–5.0)
Cholesterol, total: 140 MG/DL (ref <200)
HDL Cholesterol: 59 MG/DL
LDL, calculated: 70.4 MG/DL (ref 0–100)
Triglyceride: 53 MG/DL (ref <150)
VLDL, calculated: 10.6 MG/DL

## 2016-04-23 LAB — TROPONIN I: Troponin-I, Qt.: <0.04 ng/mL (ref <0.05)

## 2016-04-23 MED ORDER — SODIUM CHLORIDE 0.9 % IJ SYRG
INTRAMUSCULAR | Status: DC | PRN
Start: 2016-04-23 — End: 2016-05-01
  Administered 2016-04-29: 05:00:00 via INTRAVENOUS

## 2016-04-23 MED ORDER — ACETAMINOPHEN 325 MG TABLET
325 mg | ORAL | Status: DC | PRN
Start: 2016-04-23 — End: 2016-05-01
  Administered 2016-04-28: 02:00:00 via ORAL

## 2016-04-23 MED ORDER — SODIUM CHLORIDE 0.9% BOLUS IV
0.9 % | Freq: Once | INTRAVENOUS | Status: AC
Start: 2016-04-23 — End: 2016-04-23
  Administered 2016-04-23: 02:00:00 via INTRAVENOUS

## 2016-04-23 MED ORDER — CEFTRIAXONE 1 GRAM SOLUTION FOR INJECTION
1 gram | INTRAMUSCULAR | Status: AC
Start: 2016-04-23 — End: 2016-04-30
  Administered 2016-04-23 – 2016-05-01 (×9): via INTRAVENOUS

## 2016-04-23 MED ORDER — BISACODYL 5 MG TAB, DELAYED RELEASE
5 mg | Freq: Every day | ORAL | Status: DC | PRN
Start: 2016-04-23 — End: 2016-05-01

## 2016-04-23 MED ORDER — SODIUM CHLORIDE 0.9 % IJ SYRG
Freq: Once | INTRAMUSCULAR | Status: AC
Start: 2016-04-23 — End: 2016-04-23
  Administered 2016-04-23: 02:00:00 via INTRAVENOUS

## 2016-04-23 MED ORDER — IOPAMIDOL 76 % IV SOLN
370 mg iodine /mL (76 %) | Freq: Once | INTRAVENOUS | Status: DC
Start: 2016-04-23 — End: 2016-04-23
  Administered 2016-04-23: 02:00:00 via INTRAVENOUS

## 2016-04-23 MED ORDER — SODIUM CHLORIDE 0.9 % IJ SYRG
Freq: Once | INTRAMUSCULAR | Status: AC
Start: 2016-04-23 — End: 2016-04-23
  Administered 2016-04-23: 12:00:00 via INTRAVENOUS

## 2016-04-23 MED ORDER — IOPAMIDOL 76 % IV SOLN
370 mg iodine /mL (76 %) | Freq: Once | INTRAVENOUS | Status: AC
Start: 2016-04-23 — End: 2016-04-23
  Administered 2016-04-23: 12:00:00 via INTRAVENOUS

## 2016-04-23 MED ORDER — SODIUM CHLORIDE 0.9 % IV
INTRAVENOUS | Status: DC
Start: 2016-04-23 — End: 2016-04-28
  Administered 2016-04-23 – 2016-04-27 (×7): via INTRAVENOUS

## 2016-04-23 MED ORDER — SODIUM CHLORIDE 0.9 % IJ SYRG
Freq: Three times a day (TID) | INTRAMUSCULAR | Status: DC
Start: 2016-04-23 — End: 2016-05-01
  Administered 2016-04-23 – 2016-05-01 (×26): via INTRAVENOUS

## 2016-04-23 MED ORDER — SODIUM CHLORIDE 0.9% BOLUS IV
0.9 % | Freq: Once | INTRAVENOUS | Status: AC
Start: 2016-04-23 — End: 2016-04-29
  Administered 2016-04-23: 12:00:00 via INTRAVENOUS

## 2016-04-23 MED ORDER — ASPIRIN 81 MG CHEWABLE TAB
81 mg | Freq: Every day | ORAL | Status: DC
Start: 2016-04-23 — End: 2016-05-01
  Administered 2016-04-23 – 2016-05-01 (×9): via ORAL

## 2016-04-23 MED ORDER — LEVOFLOXACIN IN D5W 750 MG/150 ML IV PIGGY BACK
750 mg/150 mL | INTRAVENOUS | Status: AC
Start: 2016-04-23 — End: 2016-04-29
  Administered 2016-04-23: 02:00:00 via INTRAVENOUS

## 2016-04-23 MED ORDER — AZITHROMYCIN 500 MG IV SOLUTION
500 mg | INTRAVENOUS | Status: DC
Start: 2016-04-23 — End: 2016-04-27
  Administered 2016-04-23 – 2016-04-27 (×6): via INTRAVENOUS

## 2016-04-23 MED ORDER — ENOXAPARIN 60 MG/0.6 ML SUB-Q SYRINGE
60 mg/0.6 mL | Freq: Two times a day (BID) | SUBCUTANEOUS | Status: DC
Start: 2016-04-23 — End: 2016-04-28
  Administered 2016-04-23 – 2016-04-28 (×12): via SUBCUTANEOUS

## 2016-04-23 MED FILL — CEFTRIAXONE 1 GRAM SOLUTION FOR INJECTION: 1 gram | INTRAMUSCULAR | Qty: 1

## 2016-04-23 MED FILL — AZITHROMYCIN 500 MG IV SOLUTION: 500 mg | INTRAVENOUS | Qty: 5

## 2016-04-23 MED FILL — NORMAL SALINE FLUSH 0.9 % INJECTION SYRINGE: INTRAMUSCULAR | Qty: 10

## 2016-04-23 MED FILL — SODIUM CHLORIDE 0.9 % IV: INTRAVENOUS | Qty: 100

## 2016-04-23 MED FILL — CHILDREN'S ASPIRIN 81 MG CHEWABLE TABLET: 81 mg | ORAL | Qty: 1

## 2016-04-23 MED FILL — LEVAQUIN 750 MG/150 ML IN 5 % DEXTROSE INTRAVENOUS PIGGYBACK: 750 mg/150 mL | INTRAVENOUS | Qty: 150

## 2016-04-23 MED FILL — ISOVUE-370  76 % INTRAVENOUS SOLUTION: 370 mg iodine /mL (76 %) | INTRAVENOUS | Qty: 100

## 2016-04-23 MED FILL — LOVENOX 60 MG/0.6 ML SUBCUTANEOUS SYRINGE: 60 mg/0.6 mL | SUBCUTANEOUS | Qty: 0.6

## 2016-04-23 MED FILL — ZITHROMAX 500 MG INTRAVENOUS SOLUTION: 500 mg | INTRAVENOUS | Qty: 5

## 2016-04-23 MED FILL — SODIUM CHLORIDE 0.9 % IV: INTRAVENOUS | Qty: 1000

## 2016-04-23 NOTE — Progress Notes (Signed)
PCCM  Will hold off on EBUS  for now give new dvt Dx.  He needs uninterrupted anticoagulation for at least  1 week before we consider  interrupting for a procedure.  In meantime, get  PET scan, PFTs and give  8 day course abx for bronchopneumonia-  levaquin.

## 2016-04-23 NOTE — Progress Notes (Signed)
NUTRITION COMPLETE ASSESSMENT    RECOMMENDATIONS:   1. RD add Ensure Enlive (chocolate) BID; encourage snacks  2. Modify diet texture PRN  3. Staff set-up meal trays & assist with menu selections PRN  4. Routine weights to trend     Interventions/Plan:   Food/Nutrient Delivery:  Modify diet/texture/consistency/nutrients (High kca/pro; Cardiac) Commercial supplement (Ensure Enlive (chocolate)) Meal setup      Nutrition Education:     Coordination of Care:    Nutrition Counseling:        Assessment:   Reason for Assessment:     BPA/MST (score 2) Referral     Diet: Cardiac  Supplements: RD add Ensure Enlive (chocolate BID)  Nutritionally Significant Medications:  Reviewed & Includes:   Meal Intake: No data found.    Subjective:  Appetite poor for about 2 months with PNE. Weighed 136# on 12/17/15, day of surgery.  No ONS but my MD told me to try some Ensure. I kike milk and will try chocolate Ensure.  No dentures, eat soft meats like stew meat. Feel like I could have a BM.  No GI complaints.    Objective:  Hx hernia repair (12/2015), PNE. Admit PNE, coughing, N/V, poor appetite.  Rx ABX.  BMI 19.5 underweight.  Although no prior encounters to trend weight and pt unsure UBW, pt said wt was 136# (standing scale, May 1st day of hernia repair). No appetite x~2 months w/significant & unplanned wt loss ~15# (11%) x ~120 days.     MD told pt to drink Ensure, but never tried PTA.  Admit albumin 3.3 suggestive poor appetite. No hx DM, POC BG 117 today. RD added Ensure Enlive BID (700 kcal & 40 gm pro meets 32% kcal and 62% pro needs respectively and offered PB crackers.  Pt may need assist with tray set-up and menu selections.     Very poor dentition with multiple missing teeth.  Two natural upper and ~6 lower remaining teeth. Eats soft foods ("stewed") PTA, but refuses mechanically-altered.  Noted SLP 04/23/16 with functional swallow.  No N/V now and claims feels like he might have a BM.  Last documented BM yesterday.       Pt at nutrition risk with poor appetite, poor dentition and recent significant wt loss.       Estimated Nutrition Needs:   Kcals/day: 2200 Kcals/day  Protein: 65 g (60-65 gm/day (~1.1-1.2 gm/kg))  Fluid: 2000 ml (~30 mL/kg IBW)     Based On: [place] St Jeor (MSJ x 1.3 + 500 wt gain/repletion)  Weight Used:  55.1 kg    Pt expected to meet estimated nutrient needs:  Unable to predict at this time  Comparative Standards:  ;  ;      Nutrition Diagnosis:   1. Unintended weight loss related to appetite as evidenced by pt reports poor appetite with PNE x past ~2 months; Pt reports wt 136# on May 1st "at time of surgery"    2. Inadequate protein-energy intake related to appetite as evidenced by pt reports poor appetite with ~ 15# (11%) x past ~60 days      Goals:     Consume 100% Ensure Enlive BID in next 24-48 hrs and daily until wt 136# or more     Monitoring & Evaluation:    - Total energy intake, Liquid meal replacement, Protein intake   - Weight/weight change, Lean body mass, fat free mass     Previous Nutrition Goals Met:  N/A  Previous Recommendations:  N/A    Education & Discharge Needs:    None Identified    Participated in care plan, discharge planning, and/or interdisciplinary rounds        Cultural, religious and ethnic food preferences identified:   None    Skin Integrity: Intact   Edema:  Trace R-LE per MD   Last BM: 04/22/16  Food Allergies: NKA and tolerates lactose; Likes milk and Cornflakes    Anthropometrics:    Weight Loss Metrics 04/22/2016   Today's Wt 121 lb 6 oz   BMI 19.59 kg/m2      Last 3 Recorded Weights in this Encounter    04/22/16 1546   Weight: 55.1 kg (121 lb 6 oz)      Weight Source: Standing scale (comment)  Height: 5' 6"  (167.6 cm),    Body mass index is 19.59 kg/(m^2).  IBW : 64.4 kg (142 lb),    Usual Body Weight: 61.7 kg (136 lb) (Weighed 136# Dec 17, 2015 at time surgery),      Labs:    Lab Results   Component Value Date/Time    Sodium 135 04/23/2016 03:59 AM     Potassium 4.1 04/23/2016 03:59 AM    Chloride 103 04/23/2016 03:59 AM    CO2 23 04/23/2016 03:59 AM    Glucose 71 04/23/2016 03:59 AM    BUN 10 04/23/2016 03:59 AM    Creatinine 0.59 04/23/2016 03:59 AM    Calcium 9.5 04/23/2016 03:59 AM    Albumin 3.3 04/22/2016 04:17 PM     No results found for: HBA1C, HGBE8, HBA1CPOC, HBA1CEXT  Lab Results   Component Value Date/Time    Glucose 71 04/23/2016 03:59 AM    Glucose (POC) 117 04/23/2016 11:47 AM      Lab Results   Component Value Date/Time    ALT (SGPT) 13 04/22/2016 04:17 PM    AST (SGOT) 24 04/22/2016 04:17 PM    Alk. phosphatase 96 04/22/2016 04:17 PM    Bilirubin, total 0.4 04/22/2016 04:17 PM        Otho Perl, RD

## 2016-04-23 NOTE — Progress Notes (Signed)
Occupational Therapy Note:  Orders received and appreciated.  Chart reviewed.  Noted new RLE DVT, new LUE weakness and numbness and awaiting CT and MRI of brain as well as chest CTA.  Will hold OT and PT this am while workup being complete and follow up later this afternoon if pt able to actively participate in OT and PT evaluations.  RN aware.  Thank you for the consult.  Lauren Otila Back, OTR/L, CBIS

## 2016-04-23 NOTE — Progress Notes (Signed)
physical Therapy EVALUATION/DISCHARGE  Patient: Bruce Clark 65 y.o. male)  Date: 04/23/2016  Primary Diagnosis: Abnormal chest x-ray [R93.8]  Procedure(s) (LRB):  BRONCHOSCOPY with EBUS  (N/A)           ASSESSMENT :  Based on the objective data described below, the patient demonstrates independence with all mobility and ambulation x 350 feet and 5 stairs without using hand rail.  Pt states he is independent at his baseline and denies any falls in the last year.  He does report left hand weakness which began a few weeks ago.  Pt has mild distal weakness of LUE with 4/5 finger flexion/extension.  Pt was given a therapeutic sponge for grip strengthening. Pt is cleared for discharge from a PT standpoint.  Pt may benefit from outpatient therapy if he continues to have left hand weakness. Will discontinue PT services at this time.  Pt is cleared to ambulate in halls independently.          PLAN :  Discharge Recommendations: Outpatient  Further Equipment Recommendations for Discharge: none     SUBJECTIVE:   Patient stated ???I can do it.???  Pt independent and prefers to do everything on his own.     OBJECTIVE DATA SUMMARY:   HISTORY:    History reviewed. No pertinent past medical history.  Past Surgical History:   Procedure Laterality Date   ??? HX HERNIA REPAIR       Prior Level of Function/Home Situation: independent, denies any falls in the last year  Personal factors and/or comorbidities impacting plan of care:     Home Situation  Home Environment: Apartment  # Steps to Enter: 4  Rails to Enter: Yes  Hand Rails : Right  One/Two Story Residence: Two story  # of Interior Steps: 14  Interior Rails: Right  Living Alone: No  Support Systems: Copy  Patient Expects to be Discharged to:: Apartment  Current DME Used/Available at Home: Crutches  Tub or Shower Type: Tub/Shower combination    EXAMINATION/PRESENTATION/DECISION MAKING:   Critical Behavior:  Neurologic State: Alert, Appropriate for age   Orientation Level: Oriented X4  Cognition: Appropriate decision making, Appropriate for age attention/concentration, Appropriate safety awareness  Safety/Judgement: Good awareness of safety precautions, Insight into deficits  Hearing:  Auditory  Auditory Impairment: None  Skin: intact    Range Of Motion:   within functional limits                        Strength:     within functional limits except weak left grip, 4/5, and finger extension, 4/5                    Tone & Sensation:    intact                              Coordination:   intact       Functional Mobility:  Bed Mobility:     Supine to Sit: Independent     Scooting: Independent  Transfers:  Sit to Stand: Independent  Stand to Sit: Independent      Upon standing pt bent over to pick up slippers that were under the bed                 Balance:   Sitting: Intact  Standing: Intact  Ambulation/Gait Training:  Distance (ft): 350 Feet (ft)  Assistive Device: Gait  belt  Ambulation - Level of Assistance: Independent     Gait Description (WDL): Within defined limits                       \\   Stairs:  Number of Stairs Trained: 4  Stairs - Level of Assistance: Independent   Rail Use: Right          Physical Therapy Evaluation Charge Determination   History Examination Presentation Decision-Making   LOW Complexity : Zero comorbidities / personal factors that will impact the outcome / POC LOW Complexity : 1-2 Standardized tests and measures addressing body structure, function, activity limitation and / or participation in recreation  LOW Complexity : Stable, uncomplicated  LOW Complexity : FOTO score of 75-100      Based on the above components, the patient evaluation is determined to be of the following complexity level: LOW     Pain:  Pain Scale 1: Numeric (0 - 10)  Pain Intensity 1: 0     Activity Tolerance:   good    After treatment:      Patient left in no apparent distress sitting up in chair     Patient left in no apparent distress in bed      Call bell left within reach     Nursing notified     Caregiver present     Bed alarm activated    COMMUNICATION/EDUCATION:   Communication/Collaboration:     Fall prevention education was provided and the patient/caregiver indicated understanding.     Patient/family have participated as able and agree with findings and recommendations.     Patient is unable to participate in plan of care at this time.  Findings and recommendations were discussed with: Registered Nurse    Thank you for this referral.  Clemmie Buelna [place]   Time Calculation: 13 mins

## 2016-04-23 NOTE — Progress Notes (Signed)
Speech Pathology bedside swallow evaluation/discharge  Patient: Bruce Clark (65 y.o. male)  Date: 04/23/2016  Primary Diagnosis: Abnormal chest x-ray [R93.8]  Procedure(s) (LRB):  BRONCHOSCOPY with EBUS  (N/A)     Precautions:        ASSESSMENT :  Based on the objective data described below, the patient presents with functional oropharyngeal swallow. Timely and complete mastication of solids. Timely swallow initiation and adequate hyolaryngeal elevation/excursion via palpation. No overt s/s aspiration with successive straw sips of thin or crackers.  Patient noted to have slight L UE weakness but he was still able to hold water jug and self feed. No facial asymmetry. No dysarthria or aphasia.     Skilled therapy provided by a speech-language pathologist is not indicated at this time.     PLAN :  Recommendations:  Regular diet/ thin liquids. Straws ok  meds as tolerated    Discharge Recommendations: None     SUBJECTIVE:   Patient stated ???I am swallowing just fine???.    OBJECTIVE:   History reviewed. No pertinent past medical history.  Past Surgical History:   Procedure Laterality Date   ??? HX HERNIA REPAIR       Prior Level of Function/Home Situation:   Home Situation  Home Environment: Apartment  One/Two Story Residence: Two story  Living Alone: No  Support Systems: Friends \\ neighbors, Family member(s)  Patient Expects to be Discharged to:: Apartment  Current DME Used/Available at Home: None  Diet prior to admission: regular/thin   Current Diet:  Regular/thin    Cognitive and Communication Status:  Neurologic State: Alert  Orientation Level: Appropriate for age  Cognition: Appropriate decision making  Perception: Appears intact  Perseveration: No perseveration noted  Safety/Judgement: Awareness of environment  Oral Assessment:  Oral Assessment  Labial: No impairment  Dentition: Natural;Limited  Oral Hygiene:  (wfl)  Lingual: No impairment  Velum: Unable to visualize  Mandible: No impairment  P.O. Trials:   Patient Position:  (upright in bed)  Vocal quality prior to P.O.: No impairment  Consistency Presented: Thin liquid;Solid  How Presented: Successive swallows;Straw;Self-fed/presented     Bolus Acceptance: No impairment  Bolus Formation/Control: No impairment     Propulsion: No impairment  Oral Residue: None  Initiation of Swallow: No impairment  Laryngeal Elevation: Functional  Aspiration Signs/Symptoms: None  Pharyngeal Phase Characteristics: No impairment, issues, or problems   Effective Modifications: None  Cues for Modifications: None       Oral Phase Severity: No impairment  Pharyngeal Phase Severity : No impairment  NOMS:   The NOMS functional outcome measure was used to quantify this patient's level of swallowing impairment.  Based on the NOMS, the patient was determined to be at level 7 for swallow function     G Codes:  In compliance with CMS???s Claims Based Outcome Reporting, the following G-code set was chosen for this patient based the use of the NOMS functional outcome to quantify this patient's level of swallowing impairment.    Using the NOMS, the patient was determined to be at level 7 for swallow function which correlates with the CH= 0% level of severity.    Based on the objective assessment provided within this note, the current, goal, and discharge g-codes are as follows:    Swallow  Swallowing:  G8996 Swallow Current Status CH= 0%  G8997 Swallow Goal Status CH= 0%  G8998 Swallow D/C Status CH= 0%        NOMS Swallowing Levels:  Level 1 (CN): NPO  Level 2 (CM): NPO but takes consistency in therapy  Level 3 (CL): Takes less than 50% of nutrition p.o. and continues with nonoral feedings; and/or safe with mod cues; and/or max diet restriction  Level 4 (CK): Safe swallow but needs mod cues; and/or mod diet restriction; and/or still requires some nonoral feeding/supplements  Level 5 (CJ): Safe swallow with min diet restriction; and/or needs min cues   Level 6 (CI): Independent with p.o.; rare cues; usually self cues; may need to avoid some foods or needs extra time  Level 7 (Berger): Independent for all p.o.  ASHA. (2003). National Outcomes Measurement System (NOMS): Adult Speech-Language Pathology User's Guide.       Pain:Pain Scale 1: Numeric (0 - 10)  Pain Intensity 1: 0     After treatment:    Patient left in no apparent distress sitting up in chair   Patient left in no apparent distress in bed   Call bell left within reach   Nursing notified   Caregiver present   Bed alarm activated    COMMUNICATION/EDUCATION:   The patient???s plan of care including findings, recommendations, and recommended diet changes were discussed with: Registered Nurse.   with findings and recommendations.    Thank you for this referral.  Grace Isaac, SLP  Time Calculation: 9 mins

## 2016-04-23 NOTE — Progress Notes (Signed)
Hospitalist Progress Note  Carolee Rota, MD  Office: [phone]        Date of Service:  04/23/2016  NAME:  Bruce Clark  DOB:  1951/02/02  MRN:  [id number]      Admission Summary:   Mr Saraceno presented with right sided chest pain,cough productive of mainly whitish sputum,fever.He was admitted for pneumonia and left leg DVT.He is a chronic active smoker,about 1 PPD.    Interval history / Subjective:     Seen in follow up for pneumonia,DVT  Feels better.     Assessment & Plan:   Community acquired pneumonia  -CTA chest was negative for PE revealed bilateral lung nodules,mediastinal and hilar LAP,peribronchial dz and ILD  -Continue Levaquin.    Possible lung neoplasm.Patient is chronic active smoker  -CT chest finding worrisome.He needs work up for malignancy.Pulmonary consulted.For PET scans,EBUS on hold due to anticoagulation for DVT    Cigarette smoking: counseled to quit.    Left leg DVT: on Lovenox.  -CTA has ruled out PE  -    Left hydroureteronephrosis  -CT A/P:Marked heterogeneous enlargement of the prostate gland. Moderate left  hydroureteronephrosis to the level of the mid ureter  -Urology to see     BPH: start flomax    Left arm numbness and weakness per admission notes  -NO symptoms or signs today.Head CT was negative.     Mild Hypercalcemia due to dehydration vs malignancy  -Calcium down to normal.Check PTH      Mild hyponatremia due to prerenal,corrected with fluids.    Body mass index is 19.59 kg/(m^2).        Diet:regular  Code status: full  DVT prophylaxis: lovenox  Care Plan discussed with: patient    Hospital Problems  Date Reviewed: 21-May-2016          Codes Class Noted POA    Pneumonia ICD-10-CM: J18.9  ICD-9-CM: 382  2016-05-21 Unknown                Review of Systems:   A comprehensive review of systems was negative except for that written in the HPI.     Physical Examination:       Last 24hrs VS reviewed since prior progress note. Most recent are:  Visit Vitals   ??? BP 97/63 (BP 1 Location: Left arm, BP Patient Position: At rest)   ??? Pulse 79   ??? Temp 98.9 ??F (37.2 ??C)   ??? Resp 16   ??? Ht '5\' 6"'$  (1.676 m)   ??? Wt 55.1 kg (121 lb 6 oz)   ??? SpO2 99%   ??? BMI 19.59 kg/m2           Constitutional:  No acute distress, cooperative, pleasant??   Eyes: Non icteric,non pallor,no erythema,discharge,PERRLA   ENT:  Oral mucous moist, oropharynx benign. Neck supple,    Resp:  CTA bilaterally. No wheezing/rhonchi/rales. No accessory muscle use   CV:  Regular rhythm, normal rate, no murmurs, gallops, rubs    GI:  Soft, non distended, non tender. normoactive bowel sounds, no hepatosplenomegaly    GU:  No CVA or suprapubic tenderness   Skin  :  No erythema,rash,bullae,dipigmentation     Musculoskeletal:  No edema, warm, 2+ pulses throughout    Neurologic:  AAOx3, CN II-XII reviewed.Moves all extremities.     Psych:  Good insight, Not anxious nor agitated.       Intake/Output Summary (Last 24 hours) at 04/23/16 1356  Last data filed at 04/23/16 [phone]  Gross per 24 hour   Intake                0 ml   Output              650 ml   Net             -650 ml          Data Review:    Review and/or order of clinical lab test  Review and/or order of tests in the radiology section of CPT  Review and/or order of tests in the medicine section of CPT      Labs:     Recent Labs      04/23/16   0359  04/22/16   1617   WBC  6.6  8.2   HGB  13.4  14.7   HCT  38.1  40.6   PLT  253  289     Recent Labs      04/23/16   0359  04/22/16   1617   NA  135*  131*   K  4.1  4.3   CL  103  100   CO2  23  23   BUN  10  13   CREA  0.59*  0.59*   GLU  71  109*   CA  9.5  10.2*     Recent Labs      04/22/16   1617   SGOT  24   ALT  13   AP  96   TBILI  0.4   TP  7.8   ALB  3.3*   GLOB  4.5*   LPSE  100     No results for input(s): INR, PTP, APTT in the last 72 hours.    No lab exists for component: INREXT    No results for input(s): FE, TIBC, PSAT, FERR in the last 72 hours.   No results found for: FOL, RBCF   No results for input(s): PH, PCO2, PO2 in the last 72 hours.  Recent Labs      04/23/16   0359  04/22/16   1617   CPK   --   94   CKNDX   --   Cannot be calculated   TROIQ  <0.04  <0.04     Lab Results   Component Value Date/Time    Cholesterol, total 140 04/23/2016 03:59 AM    HDL Cholesterol 59 04/23/2016 03:59 AM    LDL, calculated 70.4 04/23/2016 03:59 AM    Triglyceride 53 04/23/2016 03:59 AM    CHOL/HDL Ratio 2.4 04/23/2016 03:59 AM     Lab Results   Component Value Date/Time    Glucose (POC) 117 04/23/2016 11:47 AM     Lab Results   Component Value Date/Time    Color YELLOW/STRAW 04/22/2016 08:22 PM    Appearance CLEAR 04/22/2016 08:22 PM    Specific gravity 1.025 04/22/2016 08:22 PM    pH (UA) 5.5 04/22/2016 08:22 PM    Protein NEGATIVE  04/22/2016 08:22 PM    Glucose NEGATIVE  04/22/2016 08:22 PM    Ketone TRACE 04/22/2016 08:22 PM    Urobilinogen 1.0 04/22/2016 08:22 PM    Nitrites NEGATIVE  04/22/2016 08:22 PM    Leukocyte Esterase NEGATIVE  04/22/2016 08:22 PM    Epithelial cells FEW 04/22/2016 08:22 PM    Bacteria NEGATIVE  04/22/2016 08:22 PM    WBC 0-4 04/22/2016 08:22 PM    RBC 0-5  04/22/2016 08:22 PM         Medications Reviewed:     Current Facility-Administered Medications   Medication Dose Route Frequency   ??? sodium chloride (NS) flush 5-10 mL  5-10 mL IntraVENous Q8H   ??? sodium chloride (NS) flush 5-10 mL  5-10 mL IntraVENous PRN   ??? cefTRIAXone (ROCEPHIN) 1 g in 0.9% sodium chloride (MBP/ADV) 50 mL  1 g IntraVENous Q24H   ??? azithromycin (ZITHROMAX) 500 mg in 0.9% sodium chloride (MBP/ADV) 250 mL  500 mg IntraVENous Q24H   ??? acetaminophen (TYLENOL) tablet 650 mg  650 mg Oral Q4H PRN   ??? aspirin chewable tablet 81 mg  81 mg Oral DAILY   ??? 0.9% sodium chloride infusion  75 mL/hr IntraVENous CONTINUOUS   ??? bisacodyl (DULCOLAX) tablet 5 mg  5 mg Oral DAILY PRN    ??? enoxaparin (LOVENOX) injection 50 mg  50 mg SubCUTAneous Q12H     ______________________________________________________________________  EXPECTED LENGTH OF STAY: 3d 14h  ACTUAL LENGTH OF STAY:          1                 [id number] Sonnie Alamo, MD

## 2016-04-23 NOTE — Other (Deleted)
To accurately capture the SOI & ROM please clarify if this patient is being treated/managed for:    =>Mild Hyponatremia in the setting of Sodium levels 131/135 requiring Normal saline replacement and lab monitoring.    =>Other Explanation of clinical findings  =>Unable to Determine (no explanation of clinical findings)    The medical record reflects the following clinical findings, treatment:    Clinical Indicators: sodium levels 131/135    Treatment: Normal saline replacement and lab monitoring     Please clarify and document your clinical opinion in the progress notes and discharge summary including the definitive and/or presumptive diagnosis, (suspected or probable), related to the above clinical findings. Please include clinical findings supporting your diagnosis.    Thank you  Debara Pickett  CDMP  [phone]

## 2016-04-23 NOTE — Consults (Addendum)
[facility]   Pulmonary, Critical Care, and Sleep Medicine    Initial Patient Consult    Name: Bruce Clark MRN: [id number]   DOB: November 24, 1950 Hospital: [facility]   Date: 04/23/2016        IMPRESSION:   ?? Med LAD and LLl and RLL nodule/ASD- this may be CAP with reactive LAD but Im concerned over primary neoplasia( bronchogenic)  ?? CFV DVT- acute  ?? Former smoker  ?? H/o hernia repair.      RECOMMENDATIONS:   ?? Plan EBUS tomorrow AM  ?? Hold lovenox before EBUS  ?? Continue current abx  ?? Nebs  ?? pulm toilet     Subjective:     This patient has been seen and evaluated at the request of Dr. Army Melia for abnl CT chest. Patient is a 65 y.o. male former smoker who presented with abd pain nausea and cough with yellow sputum. CT chest with med LAD and RLL ASD and LLL nodule. Pt c/o yellow sputum production for last week. Mild SOB. No CP.       History reviewed. No pertinent past medical history.   Past Surgical History:   Procedure Laterality Date   ??? HX HERNIA REPAIR        Prior to Admission medications    Medication Sig Start Date End Date Taking? Authorizing Provider   naproxen sodium (ALEVE) 220 mg tablet Take 220 mg by mouth daily as needed.   Yes Historical Provider     No Known Allergies   Social History   Substance Use Topics   ??? Smoking status: Not on file   ??? Smokeless tobacco: Not on file   ??? Alcohol use Not on file      History reviewed. No pertinent family history.     Current Facility-Administered Medications   Medication Dose Route Frequency   ??? sodium chloride (NS) flush 5-10 mL  5-10 mL IntraVENous Q8H   ??? cefTRIAXone (ROCEPHIN) 1 g in 0.9% sodium chloride (MBP/ADV) 50 mL  1 g IntraVENous Q24H   ??? azithromycin (ZITHROMAX) 500 mg in 0.9% sodium chloride (MBP/ADV) 250 mL  500 mg IntraVENous Q24H   ??? aspirin chewable tablet 81 mg  81 mg Oral DAILY   ??? 0.9% sodium chloride infusion  75 mL/hr IntraVENous CONTINUOUS   ??? enoxaparin (LOVENOX) injection 50 mg  50 mg SubCUTAneous Q12H        Review of Systems:  A comprehensive review of systems was negative except for that written in the HPI.    Objective:   Vital Signs:    Visit Vitals   ??? BP 97/63 (BP 1 Location: Left arm, BP Patient Position: At rest)   ??? Pulse 79   ??? Temp 98.9 ??F (37.2 ??C)   ??? Resp 16   ??? Ht 5' 6"  (1.676 m)   ??? Wt 55.1 kg (121 lb 6 oz)   ??? SpO2 99%   ??? BMI 19.59 kg/m2       O2 Device: Room air       Temp (24hrs), Avg:98.6 ??F (37 ??C), Min:98.4 ??F (36.9 ??C), Max:98.9 ??F (37.2 ??C)       Intake/Output:   Last shift:         Last 3 shifts: 09/04 1901 - 09/06 0700  In: -   Out: 650 [Urine:650]    Intake/Output Summary (Last 24 hours) at 04/23/16 1102  Last data filed at 04/23/16 0645   Gross per 24 hour  Intake                0 ml   Output              650 ml   Net             -650 ml      Physical Exam:   General:  Alert, cooperative, no distress, appears stated age.   Head:  Normocephalic, without obvious abnormality, atraumatic.   Eyes:  Conjunctivae/corneas clear. PERRL, EOMs intact.   Nose: Nares normal. Septum midline. Mucosa normal. No drainage or sinus tenderness.   Throat: Lips, mucosa, and tongue normal. Teeth and gums normal.   Neck: Supple, symmetrical, trachea midline, no adenopathy, thyroid: no enlargment/tenderness/nodules, no carotid bruit and no JVD.       Lungs:   Clear to auscultation bilaterally.       Heart:  Regular rate and rhythm, S1, S2 normal, no murmur, click, rub or gallop.   Abdomen:   Soft, non-tender. Bowel sounds normal. No masses,  No organomegaly.   Extremities: Extremities normal, atraumatic, no cyanosis or edema.   Pulses: 2+ and symmetric all extremities.   Skin: Skin color, texture, turgor normal. No rashes or lesions             Data review:     Recent Results (from the past 24 hour(s))   EKG, 12 LEAD, INITIAL    Collection Time: 04/22/16  4:14 PM   Result Value Ref Range    Ventricular Rate 94 BPM    Atrial Rate 94 BPM    P-R Interval 176 ms    QRS Duration 84 ms    Q-T Interval 340 ms     QTC Calculation (Bezet) 425 ms    Calculated P Axis 84 degrees    Calculated R Axis 152 degrees    Calculated T Axis 63 degrees    Diagnosis       Normal sinus rhythm  No previous ECGs available  Confirmed by Meda Coffee, M.D., [place] [phone]) on 04/23/2016 9:55:15 AM     CBC WITH AUTOMATED DIFF    Collection Time: 04/22/16  4:17 PM   Result Value Ref Range    WBC 8.2 4.1 - 11.1 K/uL    RBC 4.80 4.10 - 5.70 M/uL    HGB 14.7 12.1 - 17.0 g/dL    HCT 40.6 36.6 - 50.3 %    MCV 84.6 80.0 - 99.0 FL    MCH 30.6 26.0 - 34.0 PG    MCHC 36.2 30.0 - 36.5 g/dL    RDW 13.6 11.5 - 14.5 %    PLATELET 289 150 - 400 K/uL    NEUTROPHILS 67 32 - 75 %    LYMPHOCYTES 26 12 - 49 %    MONOCYTES 6 5 - 13 %    EOSINOPHILS 1 0 - 7 %    BASOPHILS 0 0 - 1 %    ABS. NEUTROPHILS 5.5 1.8 - 8.0 K/UL    ABS. LYMPHOCYTES 2.2 0.8 - 3.5 K/UL    ABS. MONOCYTES 0.5 0.0 - 1.0 K/UL    ABS. EOSINOPHILS 0.0 0.0 - 0.4 K/UL    ABS. BASOPHILS 0.0 0.0 - 0.1 K/UL   METABOLIC PANEL, COMPREHENSIVE    Collection Time: 04/22/16  4:17 PM   Result Value Ref Range    Sodium 131 (L) 136 - 145 mmol/L    Potassium 4.3 3.5 - 5.1 mmol/L    Chloride 100 97 -  108 mmol/L    CO2 23 21 - 32 mmol/L    Anion gap 8 5 - 15 mmol/L    Glucose 109 (H) 65 - 100 mg/dL    BUN 13 6 - 20 MG/DL    Creatinine 0.59 (L) 0.70 - 1.30 MG/DL    BUN/Creatinine ratio 22 (H) 12 - 20      GFR est AA >60 >60 ml/min/1.37m    GFR est non-AA >60 >60 ml/min/1.75m   Calcium 10.2 (H) 8.5 - 10.1 MG/DL    Bilirubin, total 0.4 0.2 - 1.0 MG/DL    ALT (SGPT) 13 12 - 78 U/L    AST (SGOT) 24 15 - 37 U/L    Alk. phosphatase 96 45 - 117 U/L    Protein, total 7.8 6.4 - 8.2 g/dL    Albumin 3.3 (L) 3.5 - 5.0 g/dL    Globulin 4.5 (H) 2.0 - 4.0 g/dL    A-G Ratio 0.7 (L) 1.1 - 2.2     TROPONIN I    Collection Time: 04/22/16  4:17 PM   Result Value Ref Range    Troponin-I, Qt. <0.04 <0.05 ng/mL   CK W/ CKMB & INDEX    Collection Time: 04/22/16  4:17 PM   Result Value Ref Range    CK 94 39 - 308 U/L    CK - MB <1.0 <3.6 NG/ML     CK-MB Index Cannot be calculated 0 - 2.5     LIPASE    Collection Time: 04/22/16  4:17 PM   Result Value Ref Range    Lipase 100 73 - 393 U/L   URINALYSIS W/MICROSCOPIC    Collection Time: 04/22/16  8:22 PM   Result Value Ref Range    Color YELLOW/STRAW      Appearance CLEAR CLEAR      Specific gravity 1.025 1.003 - 1.030      pH (UA) 5.5 5.0 - 8.0      Protein NEGATIVE  NEG mg/dL    Glucose NEGATIVE  NEG mg/dL    Ketone TRACE (A) NEG mg/dL    Blood NEGATIVE  NEG      Urobilinogen 1.0 0.2 - 1.0 EU/dL    Nitrites NEGATIVE  NEG      Leukocyte Esterase NEGATIVE  NEG      WBC 0-4 0 - 4 /hpf    RBC 0-5 0 - 5 /hpf    Epithelial cells FEW FEW /lpf    Bacteria NEGATIVE  NEG /hpf    Hyaline cast 0-2 0 - 5 /lpf   BILIRUBIN, CONFIRM    Collection Time: 04/22/16  8:22 PM   Result Value Ref Range    Bilirubin UA, confirm NEGATIVE  NEG     TROPONIN I    Collection Time: 04/23/16  3:59 AM   Result Value Ref Range    Troponin-I, Qt. <0.04 <0.05 ng/mL   LIPID PANEL    Collection Time: 04/23/16  3:59 AM   Result Value Ref Range    LIPID PROFILE          Cholesterol, total 140 <200 MG/DL    Triglyceride 53 <150 MG/DL    HDL Cholesterol 59 MG/DL    LDL, calculated 70.4 0 - 100 MG/DL    VLDL, calculated 10.6 MG/DL    CHOL/HDL Ratio 2.4 0 - 5.0     METABOLIC PANEL, BASIC    Collection Time: 04/23/16  3:59 AM   Result Value Ref Range  Sodium 135 (L) 136 - 145 mmol/L    Potassium 4.1 3.5 - 5.1 mmol/L    Chloride 103 97 - 108 mmol/L    CO2 23 21 - 32 mmol/L    Anion gap 9 5 - 15 mmol/L    Glucose 71 65 - 100 mg/dL    BUN 10 6 - 20 MG/DL    Creatinine 0.59 (L) 0.70 - 1.30 MG/DL    BUN/Creatinine ratio 17 12 - 20      GFR est AA >60 >60 ml/min/1.58m    GFR est non-AA >60 >60 ml/min/1.764m   Calcium 9.5 8.5 - 10.1 MG/DL   CBC WITH AUTOMATED DIFF    Collection Time: 04/23/16  3:59 AM   Result Value Ref Range    WBC 6.6 4.1 - 11.1 K/uL    RBC 4.43 4.10 - 5.70 M/uL    HGB 13.4 12.1 - 17.0 g/dL    HCT 38.1 36.6 - 50.3 %     MCV 86.0 80.0 - 99.0 FL    MCH 30.2 26.0 - 34.0 PG    MCHC 35.2 30.0 - 36.5 g/dL    RDW 13.5 11.5 - 14.5 %    PLATELET 253 150 - 400 K/uL    NEUTROPHILS 54 32 - 75 %    LYMPHOCYTES 34 12 - 49 %    MONOCYTES 10 5 - 13 %    EOSINOPHILS 2 0 - 7 %    BASOPHILS 0 0 - 1 %    ABS. NEUTROPHILS 3.6 1.8 - 8.0 K/UL    ABS. LYMPHOCYTES 2.2 0.8 - 3.5 K/UL    ABS. MONOCYTES 0.7 0.0 - 1.0 K/UL    ABS. EOSINOPHILS 0.1 0.0 - 0.4 K/UL    ABS. BASOPHILS 0.0 0.0 - 0.1 K/UL       Imaging:  I have personally reviewed the patient???s radiographs and have reviewed the reports:  CT chest with LL nodule and RLL ASD and RLL nodule med LAD present no ILd changes        SrLowella BandyMD

## 2016-04-23 NOTE — Consults (Signed)
[facility]   Pulmonary, Critical Care, and Sleep Medicine    Initial Patient Consult    Name: Bruce Clark MRN: [id number]   DOB: November 24, 1950 Hospital: [facility]   Date: 04/23/2016        IMPRESSION:   ?? Med LAD and LLl and RLL nodule/ASD- this may be CAP with reactive LAD but Im concerned over primary neoplasia( bronchogenic)  ?? CFV DVT- acute  ?? Former smoker  ?? H/o hernia repair.      RECOMMENDATIONS:   ?? Plan EBUS tomorrow AM  ?? Hold lovenox before EBUS  ?? Continue current abx  ?? Nebs  ?? pulm toilet     Subjective:     This patient has been seen and evaluated at the request of Dr. Army Melia for abnl CT chest. Patient is a 65 y.o. male former smoker who presented with abd pain nausea and cough with yellow sputum. CT chest with med LAD and RLL ASD and LLL nodule. Pt c/o yellow sputum production for last week. Mild SOB. No CP.       History reviewed. No pertinent past medical history.   Past Surgical History:   Procedure Laterality Date   ??? HX HERNIA REPAIR        Prior to Admission medications    Medication Sig Start Date End Date Taking? Authorizing Provider   naproxen sodium (ALEVE) 220 mg tablet Take 220 mg by mouth daily as needed.   Yes Historical Provider     No Known Allergies   Social History   Substance Use Topics   ??? Smoking status: Not on file   ??? Smokeless tobacco: Not on file   ??? Alcohol use Not on file      History reviewed. No pertinent family history.     Current Facility-Administered Medications   Medication Dose Route Frequency   ??? sodium chloride (NS) flush 5-10 mL  5-10 mL IntraVENous Q8H   ??? cefTRIAXone (ROCEPHIN) 1 g in 0.9% sodium chloride (MBP/ADV) 50 mL  1 g IntraVENous Q24H   ??? azithromycin (ZITHROMAX) 500 mg in 0.9% sodium chloride (MBP/ADV) 250 mL  500 mg IntraVENous Q24H   ??? aspirin chewable tablet 81 mg  81 mg Oral DAILY   ??? 0.9% sodium chloride infusion  75 mL/hr IntraVENous CONTINUOUS   ??? enoxaparin (LOVENOX) injection 50 mg  50 mg SubCUTAneous Q12H        Review of Systems:  A comprehensive review of systems was negative except for that written in the HPI.    Objective:   Vital Signs:    Visit Vitals   ??? BP 97/63 (BP 1 Location: Left arm, BP Patient Position: At rest)   ??? Pulse 79   ??? Temp 98.9 ??F (37.2 ??C)   ??? Resp 16   ??? Ht 5' 6"  (1.676 m)   ??? Wt 55.1 kg (121 lb 6 oz)   ??? SpO2 99%   ??? BMI 19.59 kg/m2       O2 Device: Room air       Temp (24hrs), Avg:98.6 ??F (37 ??C), Min:98.4 ??F (36.9 ??C), Max:98.9 ??F (37.2 ??C)       Intake/Output:   Last shift:         Last 3 shifts: 09/04 1901 - 09/06 0700  In: -   Out: 650 [Urine:650]    Intake/Output Summary (Last 24 hours) at 04/23/16 1102  Last data filed at 04/23/16 0645   Gross per 24 hour  Intake                0 ml   Output              650 ml   Net             -650 ml      Physical Exam:   General:  Alert, cooperative, no distress, appears stated age.   Head:  Normocephalic, without obvious abnormality, atraumatic.   Eyes:  Conjunctivae/corneas clear. PERRL, EOMs intact.   Nose: Nares normal. Septum midline. Mucosa normal. No drainage or sinus tenderness.   Throat: Lips, mucosa, and tongue normal. Teeth and gums normal.   Neck: Supple, symmetrical, trachea midline, no adenopathy, thyroid: no enlargment/tenderness/nodules, no carotid bruit and no JVD.       Lungs:   Clear to auscultation bilaterally.       Heart:  Regular rate and rhythm, S1, S2 normal, no murmur, click, rub or gallop.   Abdomen:   Soft, non-tender. Bowel sounds normal. No masses,  No organomegaly.   Extremities: Extremities normal, atraumatic, no cyanosis or edema.   Pulses: 2+ and symmetric all extremities.   Skin: Skin color, texture, turgor normal. No rashes or lesions             Data review:     Recent Results (from the past 24 hour(s))   EKG, 12 LEAD, INITIAL    Collection Time: 04/22/16  4:14 PM   Result Value Ref Range    Ventricular Rate 94 BPM    Atrial Rate 94 BPM    P-R Interval 176 ms    QRS Duration 84 ms    Q-T Interval 340 ms    QTC  Calculation (Bezet) 425 ms    Calculated P Axis 84 degrees    Calculated R Axis 152 degrees    Calculated T Axis 63 degrees    Diagnosis       Normal sinus rhythm  No previous ECGs available  Confirmed by Meda Coffee, M.D., [place] [phone]) on 04/23/2016 9:55:15 AM     CBC WITH AUTOMATED DIFF    Collection Time: 04/22/16  4:17 PM   Result Value Ref Range    WBC 8.2 4.1 - 11.1 K/uL    RBC 4.80 4.10 - 5.70 M/uL    HGB 14.7 12.1 - 17.0 g/dL    HCT 40.6 36.6 - 50.3 %    MCV 84.6 80.0 - 99.0 FL    MCH 30.6 26.0 - 34.0 PG    MCHC 36.2 30.0 - 36.5 g/dL    RDW 13.6 11.5 - 14.5 %    PLATELET 289 150 - 400 K/uL    NEUTROPHILS 67 32 - 75 %    LYMPHOCYTES 26 12 - 49 %    MONOCYTES 6 5 - 13 %    EOSINOPHILS 1 0 - 7 %    BASOPHILS 0 0 - 1 %    ABS. NEUTROPHILS 5.5 1.8 - 8.0 K/UL    ABS. LYMPHOCYTES 2.2 0.8 - 3.5 K/UL    ABS. MONOCYTES 0.5 0.0 - 1.0 K/UL    ABS. EOSINOPHILS 0.0 0.0 - 0.4 K/UL    ABS. BASOPHILS 0.0 0.0 - 0.1 K/UL   METABOLIC PANEL, COMPREHENSIVE    Collection Time: 04/22/16  4:17 PM   Result Value Ref Range    Sodium 131 (L) 136 - 145 mmol/L    Potassium 4.3 3.5 - 5.1 mmol/L    Chloride 100 97 -  108 mmol/L    CO2 23 21 - 32 mmol/L    Anion gap 8 5 - 15 mmol/L    Glucose 109 (H) 65 - 100 mg/dL    BUN 13 6 - 20 MG/DL    Creatinine 0.59 (L) 0.70 - 1.30 MG/DL    BUN/Creatinine ratio 22 (H) 12 - 20      GFR est AA >60 >60 ml/min/1.37m    GFR est non-AA >60 >60 ml/min/1.75m   Calcium 10.2 (H) 8.5 - 10.1 MG/DL    Bilirubin, total 0.4 0.2 - 1.0 MG/DL    ALT (SGPT) 13 12 - 78 U/L    AST (SGOT) 24 15 - 37 U/L    Alk. phosphatase 96 45 - 117 U/L    Protein, total 7.8 6.4 - 8.2 g/dL    Albumin 3.3 (L) 3.5 - 5.0 g/dL    Globulin 4.5 (H) 2.0 - 4.0 g/dL    A-G Ratio 0.7 (L) 1.1 - 2.2     TROPONIN I    Collection Time: 04/22/16  4:17 PM   Result Value Ref Range    Troponin-I, Qt. <0.04 <0.05 ng/mL   CK W/ CKMB & INDEX    Collection Time: 04/22/16  4:17 PM   Result Value Ref Range    CK 94 39 - 308 U/L    CK - MB <1.0 <3.6 NG/ML     CK-MB Index Cannot be calculated 0 - 2.5     LIPASE    Collection Time: 04/22/16  4:17 PM   Result Value Ref Range    Lipase 100 73 - 393 U/L   URINALYSIS W/MICROSCOPIC    Collection Time: 04/22/16  8:22 PM   Result Value Ref Range    Color YELLOW/STRAW      Appearance CLEAR CLEAR      Specific gravity 1.025 1.003 - 1.030      pH (UA) 5.5 5.0 - 8.0      Protein NEGATIVE  NEG mg/dL    Glucose NEGATIVE  NEG mg/dL    Ketone TRACE (A) NEG mg/dL    Blood NEGATIVE  NEG      Urobilinogen 1.0 0.2 - 1.0 EU/dL    Nitrites NEGATIVE  NEG      Leukocyte Esterase NEGATIVE  NEG      WBC 0-4 0 - 4 /hpf    RBC 0-5 0 - 5 /hpf    Epithelial cells FEW FEW /lpf    Bacteria NEGATIVE  NEG /hpf    Hyaline cast 0-2 0 - 5 /lpf   BILIRUBIN, CONFIRM    Collection Time: 04/22/16  8:22 PM   Result Value Ref Range    Bilirubin UA, confirm NEGATIVE  NEG     TROPONIN I    Collection Time: 04/23/16  3:59 AM   Result Value Ref Range    Troponin-I, Qt. <0.04 <0.05 ng/mL   LIPID PANEL    Collection Time: 04/23/16  3:59 AM   Result Value Ref Range    LIPID PROFILE          Cholesterol, total 140 <200 MG/DL    Triglyceride 53 <150 MG/DL    HDL Cholesterol 59 MG/DL    LDL, calculated 70.4 0 - 100 MG/DL    VLDL, calculated 10.6 MG/DL    CHOL/HDL Ratio 2.4 0 - 5.0     METABOLIC PANEL, BASIC    Collection Time: 04/23/16  3:59 AM   Result Value Ref Range  Sodium 135 (L) 136 - 145 mmol/L    Potassium 4.1 3.5 - 5.1 mmol/L    Chloride 103 97 - 108 mmol/L    CO2 23 21 - 32 mmol/L    Anion gap 9 5 - 15 mmol/L    Glucose 71 65 - 100 mg/dL    BUN 10 6 - 20 MG/DL    Creatinine 0.59 (L) 0.70 - 1.30 MG/DL    BUN/Creatinine ratio 17 12 - 20      GFR est AA >60 >60 ml/min/1.79m    GFR est non-AA >60 >60 ml/min/1.731m   Calcium 9.5 8.5 - 10.1 MG/DL   CBC WITH AUTOMATED DIFF    Collection Time: 04/23/16  3:59 AM   Result Value Ref Range    WBC 6.6 4.1 - 11.1 K/uL    RBC 4.43 4.10 - 5.70 M/uL    HGB 13.4 12.1 - 17.0 g/dL    HCT 38.1 36.6 - 50.3 %    MCV 86.0 80.0 - 99.0  FL    MCH 30.2 26.0 - 34.0 PG    MCHC 35.2 30.0 - 36.5 g/dL    RDW 13.5 11.5 - 14.5 %    PLATELET 253 150 - 400 K/uL    NEUTROPHILS 54 32 - 75 %    LYMPHOCYTES 34 12 - 49 %    MONOCYTES 10 5 - 13 %    EOSINOPHILS 2 0 - 7 %    BASOPHILS 0 0 - 1 %    ABS. NEUTROPHILS 3.6 1.8 - 8.0 K/UL    ABS. LYMPHOCYTES 2.2 0.8 - 3.5 K/UL    ABS. MONOCYTES 0.7 0.0 - 1.0 K/UL    ABS. EOSINOPHILS 0.1 0.0 - 0.4 K/UL    ABS. BASOPHILS 0.0 0.0 - 0.1 K/UL       Imaging:  I have personally reviewed the patient???s radiographs and have reviewed the reports:  CT chest with LL nodule and RLL ASD and RLL nodule med LAD present no ILd changes        SrLowella BandyMD

## 2016-04-24 LAB — METABOLIC PANEL, COMPREHENSIVE
A-G Ratio: 0.8 — ABNORMAL LOW (ref 1.1–2.2)
ALT (SGPT): 11 U/L — ABNORMAL LOW (ref 12–78)
AST (SGOT): 58 U/L — ABNORMAL HIGH (ref 15–37)
Albumin: 2.9 g/dL — ABNORMAL LOW (ref 3.5–5.0)
Alk. phosphatase: 99 U/L (ref 45–117)
Anion gap: 10 mmol/L (ref 5–15)
BUN/Creatinine ratio: 13 (ref 12–20)
BUN: 6 MG/DL (ref 6–20)
Bilirubin, total: 0.3 MG/DL (ref 0.2–1.0)
CO2: 22 mmol/L (ref 21–32)
Calcium: 9.8 MG/DL (ref 8.5–10.1)
Chloride: 104 mmol/L (ref 97–108)
Creatinine: 0.45 MG/DL — ABNORMAL LOW (ref 0.70–1.30)
GFR est AA: >60 mL/min/{1.73_m2} (ref >60)
GFR est non-AA: >60 mL/min/{1.73_m2} (ref >60)
Globulin: 3.7 g/dL (ref 2.0–4.0)
Glucose: 80 mg/dL (ref 65–100)
Potassium: 4 mmol/L (ref 3.5–5.1)
Protein, total: 6.6 g/dL (ref 6.4–8.2)
Sodium: 136 mmol/L (ref 136–145)

## 2016-04-24 LAB — LEGIONELLA PNEUMOPHILA AG, URINE: L pneumophila S1 Ag, urine: NEGATIVE

## 2016-04-24 LAB — GLUCOSE, POC
Glucose (POC): 98 mg/dL (ref 65–100)
Glucose (POC): 135 mg/dL — ABNORMAL HIGH (ref 65–100)
Glucose (POC): 108 mg/dL — ABNORMAL HIGH (ref 65–100)

## 2016-04-24 LAB — PTH INTACT
Calcium: 9.5 MG/DL (ref 8.5–10.1)
PTH, Intact: 59.6 pg/mL (ref 14.0–72.0)

## 2016-04-24 MED FILL — CHILDREN'S ASPIRIN 81 MG CHEWABLE TABLET: 81 mg | ORAL | Qty: 1

## 2016-04-24 MED FILL — NORMAL SALINE FLUSH 0.9 % INJECTION SYRINGE: INTRAMUSCULAR | Qty: 10

## 2016-04-24 MED FILL — AZITHROMYCIN 500 MG IV SOLUTION: 500 mg | INTRAVENOUS | Qty: 5

## 2016-04-24 MED FILL — CEFTRIAXONE 1 GRAM SOLUTION FOR INJECTION: 1 gram | INTRAMUSCULAR | Qty: 1

## 2016-04-24 MED FILL — LOVENOX 60 MG/0.6 ML SUBCUTANEOUS SYRINGE: 60 mg/0.6 mL | SUBCUTANEOUS | Qty: 0.6

## 2016-04-24 NOTE — Progress Notes (Signed)
Pulmonary, Critical Care, and Sleep Medicine~Progress Note    Name: Bruce Clark MRN: [id number]   DOB: [birth year] Hospital: [facility]   Date: 04/24/2016 10:38 AM Admission: 04/22/2016     IMPRESSION:   ?? Abnormal CT scan; 9/6 (no prior films for review); 2.7 in RLL, 2.1cm x 2 in LLL, diffuse peribronchial thickening and interstitial disease bilaterally, bilateral hilar/mediastinal lymph nodes largest 3.3 cm in right hilar  ?? Former smoker  ?? Hx of hernia repair   ?? Right DVT      PLAN:   ?? EBUS on hold for anticoagulation; as directed by Dr Beverlee Nims   ?? Currently on Lovenox; financial status will impact anticoagulation decision  ?? spirometry   ?? On azithromycin/rocephin  ?? PET on Monday if he stays, review for secondary site to bx as well.   ?? O2 titration above 90%  ?? Discussed with hospitalist      Daily Progression:    No acute dyspnea or chest pains     OBJECTIVE:     Vital Signs:       Visit Vitals   ??? BP 120/67   ??? Pulse 90   ??? Temp 97.8 ??F (36.6 ??C)   ??? Resp 16   ??? Ht '5\' 6"'$  (1.676 m)   ??? Wt 55.1 kg (121 lb 6 oz)   ??? SpO2 95%   ??? BMI 19.59 kg/m2      Temp (24hrs), Avg:98.2 ??F (36.8 ??C), Min:97.8 ??F (36.6 ??C), Max:98.7 ??F (37.1 ??C)     Intake/Output:     Last shift: 09/07 0701 - 09/07 1900  In: -   Out: 500 [Urine:500]    Last 3 shifts: 09/05 1901 - 09/07 0700  In: 6440 [I.V.:1705]  Out: 1050 [Urine:1050]        Intake/Output Summary (Last 24 hours) at 04/24/16 1038  Last data filed at 04/24/16 0915   Gross per 24 hour   Intake             1705 ml   Output              900 ml   Net              805 ml       Physical Exam:                                        Exam Findings Other   General: No resp distress noted, appears stated age    HEENT:  No ulcers, JVD not elevated, no cervical LAD    Chest: No pectus deformity, normal chest rise b/l    HEART:  RRR, no murmurs/rubs/gallops     Lungs:  CTA b/l, no rhonchi/crackles/wheeze, diminished BS at bases    ABD: Soft/NT, non rigid mildly distended    EXT: No cyanosis/clubbing/edema, normal peripheral pulses    Skin: No rashes or ulcers, no mottling    Neuro: A/O x 3        Medications:  Current Facility-Administered Medications   Medication Dose Route Frequency   ??? sodium chloride (NS) flush 5-10 mL  5-10 mL IntraVENous Q8H   ??? sodium chloride (NS) flush 5-10 mL  5-10 mL IntraVENous PRN   ??? cefTRIAXone (ROCEPHIN) 1 g in 0.9% sodium chloride (MBP/ADV) 50 mL  1 g IntraVENous Q24H   ??? azithromycin (ZITHROMAX) 500 mg in 0.9%  sodium chloride (MBP/ADV) 250 mL  500 mg IntraVENous Q24H   ??? acetaminophen (TYLENOL) tablet 650 mg  650 mg Oral Q4H PRN   ??? aspirin chewable tablet 81 mg  81 mg Oral DAILY   ??? 0.9% sodium chloride infusion  75 mL/hr IntraVENous CONTINUOUS   ??? bisacodyl (DULCOLAX) tablet 5 mg  5 mg Oral DAILY PRN   ??? enoxaparin (LOVENOX) injection 50 mg  50 mg SubCUTAneous Q12H       Labs:  ABG No results for input(s): PHI, PCO2I, PO2I, HCO3I, SO2I, FIO2I in the last 72 hours.     CBC Recent Labs      04/23/16   0359  04/22/16   1617   WBC  6.6  8.2   HGB  13.4  14.7   HCT  38.1  40.6   PLT  253  289   MCV  86.0  84.6   MCH  30.2  54.0        Metabolic  Panel Recent Labs      04/24/16   0419  04/23/16   0359  04/22/16   1617   NA  136  135*  131*   K  4.0  4.1  4.3   CL  104  103  100   CO2  '22  23  23   '$ GLU  80  71  109*   BUN  '6  10  13   '$ CREA  0.45*  0.59*  0.59*   CA  9.5   9.8  9.5  10.2*   ALB  2.9*   --   3.3*   SGOT  58*   --   24   ALT  11*   --   13        Pertinent Labs                [name], [place]  04/24/2016

## 2016-04-24 NOTE — Progress Notes (Signed)
Problem: Self Care Deficits Care Plan (Adult)  Goal: *Acute Goals and Plan of Care (Insert Text)  Occupational Therapy Goals  Initiated 04/24/2016   1. Patient will perform grooming with LUE standing at sink with modified independence within 7 day(s).  2. Patient will perform self-feeding with LUE with assistance from RUE PRN for container mgmt with modified independence within 7 day(s).  3. Patient will participate in L upper extremity therapeutic exercise/activities with modified independence for 15 minutes within 7 day(s).   4. Patient will utilize energy conservation techniques during functional activities with verbal cues within 7 day(s).  5. Patient will improve their Fugl Meyer score by 5 points within 7 days.  OCCUPATIONAL THERAPY EVALUATION  Patient: Bruce Clark (65 y.o. male)  Date: 04/24/2016  Primary Diagnosis: Abnormal chest x-ray [R93.8]  Procedure(s) (LRB):  BRONCHOSCOPY with EBUS  (N/A)     Precautions: Universal         ASSESSMENT :  Based on the objective data described below, the patient presents with impaired L wrist AROM/stability and grip strength necessary in ADL tasks.  He was admitted with bronchitis and having pulmonary work up with dx of RLE DVT, on Lovenox.  Nursing cleared for therapy.  He lives with his girlfriend and her 85 year old, does not work and does not drive.  He reports L hand impairments for approx one month.  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  Patient reporting MD told him earlier in the day about the stroke on the R side.  Completed Fugl Meyer on LUE 53/66 with limitations in wrist AROM/stability and grip strength.  Provided education on neuroplasticity including use of LUE with all safe ADL tasks and L finger/grip exercises with blue sponge that PT had issued.  Patient with good understanding.  Patient reports good improvement in LUE in the last two days.  If  impairment continues he may benefit from OP neuro rehab to maximize indep and use of LUE.      Recommend next session to provide ed on BE FAST stroke recognition education.  He would benefit from education LUE WB exercises and continued ed on self ROM for L wrist.       Patient will benefit from skilled intervention to address the above impairments.  Patient???s rehabilitation potential is considered to be Good  Factors which may influence rehabilitation potential include:   '[ ]'$                 None noted  '[ ]'$                 Mental ability/status  '[ ]'$                 Medical condition  '[ ]'$                 Home/family situation and support systems  '[ ]'$                 Safety awareness  '[ ]'$                 Pain tolerance/management  '[ ]'$                 Other:        PLAN :  Recommendations and Planned Interventions:  '[ ]'$                   Self Care Training                  '[ ]'$   Therapeutic Activities  '[ ]'$                   Functional Mobility Training    '[ ]'$            Cognitive Retraining  '[ ]'$                   Therapeutic Exercises           '[ ]'$            Endurance Activities  '[ ]'$                   Balance Training                   '[ ]'$            Neuromuscular Re-Education  '[ ]'$                   Visual/Perceptual Training     '[ ]'$       Home Safety Training  '[ ]'$                   Patient Education                 '[ ]'$            Family Training/Education  '[ ]'$                   Other (comment):     Frequency/Duration: Patient will be followed by occupational therapy 5 times a week to address goals.  Discharge Recommendations: Outpatient vs None  Further Equipment Recommendations for Discharge: none for Ot at this time       SUBJECTIVE:   Patient stated ???Its been getting weak for about a month.???      OBJECTIVE DATA SUMMARY:   HISTORY:   History reviewed. No pertinent past medical history.  Past Surgical History:   Procedure Laterality Date   ??? HX HERNIA REPAIR             Prior Level of Function/Home Situation: Home with girlfriend. Indep with all ADL tasks.         Home Situation  Home Environment: Apartment  # Steps to Enter: 4  Rails to Enter: Yes  Hand Rails : Right  One/Two Story Residence: Two story  # of Interior Steps: 14  Interior Rails: Right  Living Alone: No  Support Systems: Copy  Patient Expects to be Discharged to:: Apartment  Current DME Used/Available at Home: Crutches  Tub or Shower Type: Tub/Shower combination  '[X]'$   Right hand dominant             '[ ]'$   Left hand dominant     EXAMINATION OF PERFORMANCE DEFICITS:  Cognitive/Behavioral Status:  Neurologic State: Alert  Orientation Level: Oriented X4        Skin: uppers intact     Edema: uppers none     Hearing:  Auditory  Auditory Impairment: None     Vision/Perceptual:               Acuity: Within Defined Limits          Range of Motion:  RUE: WDL  LUE: wrist impaired AROM, PROM WDL           Strength:  RUE: WDL  LUE: wrist impaired AROM, PROM WDL           Coordination:     Fine Motor  Skills-Upper: Left Impaired;Right Intact    Gross Motor Skills-Upper: Left Intact;Right Intact     Tone & Sensation:  Tone: LUE impaired  Sensation: mild impairment in L wrist/hand           Balance:  Sitting: Intact  Standing: Intact     Functional Mobility and Transfers for ADLs:  Bed Mobility:  Rolling: Independent  Supine to Sit: Independent  Sit to Supine: Independent  Scooting: Independent     Transfers:  Sit to Stand: Independent        ADL Assessment:  Feeding: Independent (RUE)     Oral Facial Hygiene/Grooming: Independent (RUE)     Bathing: Modified independent     Upper Body Dressing: Supervision;Setup (verbal cues)     Lower Body Dressing: Supervision;Setup     Toileting: Independent        Therapeutic Exercise:    EXERCISE   Sets   Reps   Active Active Assist   Passive   Comments   Finger flexion(fist) 1 10 '[X]'$            '[ ]'$            '[ ]'$            Blue sponge- horizontal then vertical    Adduction between fingers 1 10 '[X]'$            '[ ]'$            '[ ]'$            " "   Thumb to each finger opposition 1 3 '[X]'$            '[ ]'$            '[ ]'$            Blue sponge each finger   Wrist self PROM 1 5 '[ ]'$            '[ ]'$            '[X]'$            With RUE- flexion/extension         '[ ]'$            '[ ]'$            '[ ]'$                Education on neuroplasticity and importance of use of LUE with safe ADl tasks, exercises and sensory input. Patient with good understanding and reports use of sponge multiple times a day.          Functional Measure:   Fugl-Meyer Assessment of Motor Recovery after Stroke:       Reflex Activity  Flexors/Biceps/Fingers: Can be elicited  Extensors/Triceps: Can be elicited  Reflex Subtotal: 4     Volitional Movement Within Synergies  Shoulder Retraction: Full  Shoulder Elevation: Full  Shoulder Abduction (90 degrees): Full  Shoulder External Rotation: Full  Elbow Flexion: Full  Forearm Supination: Full  Shoulder Adduction/Internal Rotation: Full  Elbow Extension: Full  Forearm Pronation: Full  Subtotal: 18     Volitional Movement Mixing Synergies  Hand to Lumbar Spine: Full  Shoulder Flexion (0-90 degrees): Full  Pronation-Supination: Full  Subtotal: 6     Volitional Movement With Little or No Synergy  Shoulder Abduction (0-90 degrees): Full  Shoulder Flexion (90-180 degrees): Full  Pronation/Supination: Full  Subtotal : 6     Normal Reflex Activity  Biceps, Triceps, Finger Flexors: Full  Subtotal : 2  Upper Extremity Total   Upper Extremity Total: 36     Wrist  Stability at 15 Degree Dorsiflexion: None  Repeated Dorsiflexion/ Volar Flexion: None  Stability at 15 Degree Dorsiflexion: None  Repeated Dorsiflexion/ Volar Flexion: None  Circumduction: None  Wrist Total: 0     Hand  Mass Flexion: Full  Mass Extension: Full  Grasp A: Partial  Grasp B: Partial  Grasp C: Partial  Grasp D: Full  Grasp E: Full  Hand Total: 11     Coordination/Speed  Tremor: None  Dysmetria: None  Time: <1s   Coordination/Speed Total : 6     Total A-D  Total A-D (Motor Function): 53/66      Percentage of impairment CH  0% CI  1-19% CJ  20-39% CK  40-59% CL  60-79% CM  80-99% CN  100%   Fugl-Meyer score: 0-66 66 53-65 39-52 26-38 13-25 1-12    0      This is a reliable/valid measure of arm function after a neurological event. It has established value to characterize functional status and for measuring spontaneous and therapy-induced recovery; tests proximal and distal motor functions. Fugl-Meyer Assessment ??? UE scores recorded between five and 30 days post neurologic event can be used to predict UE recovery at six months post neurologic event.  Severe = 0-21 points   Moderately Severe = 22-33 points   Moderate = 34-47 points   Mild = 48-66 points  Dolores Hoose, D., Divine, G., & Feussner, J. (1992). Measurement of motor recovery after stroke: Outcome assessment and sample size requirements. Stroke, 23, pp. [phone].   ------------------------------------------------------------------------------------------------------------------------------------------------------------------  MCID:  Stroke:   Karlton Lemon et al, 2001; n = 171; mean age 73 (77) years; assessed within 37 (12) days of stroke, Acute Stroke)  FMA Motor Scores from Admission to Discharge   ?? 10 point increase in FMA Upper Extremity = 1.5 change in discharge FIM  ?? 10 point increase in FMA Lower Extremity = 1.9 change in discharge FIM  MDC:   Stroke:   Earleen Newport et al, 2008, n = 14, mean age = 59.9 (14.6) years, assessed on average 14 (6.5) months post stroke, Chronic Stroke)   ?? FMA = 5.2 points for the Upper Extremity portion of the assessment      G codes:  In compliance with CMS???s Claims Based Outcome Reporting, the following G-code set was chosen for this patient based on their primary functional limitation being treated:     The outcome measure chosen to determine the severity of the functional  limitation was the [facility] with a score of 53/66 which was correlated with the impairment scale.      ?? Other PT/OT Primary Functional Limitations:              [phone] - CURRENT STATUS:    CI - 1%-19% impaired, limited or restricted              G8991 - GOAL STATUS:           CH - 0% impaired, limited or restricted              A2130 - D/C STATUS:                       ---------------To be determined---------------       Occupational Therapy Evaluation Charge Determination   History Examination Decision-Making   LOW Complexity : Brief history review  MEDIUM Complexity :  3-5 performance deficits relating to physical, cognitive , or psychosocial skils that result in activity limitations and / or participation restrictions MEDIUM Complexity : Patient may present with comorbidities that affect occupational performnce. Miniml to moderate modification of tasks or assistance (eg, physical or verbal ) with assesment(s) is necessary to enable patient to complete evaluation       Based on the above components, the patient evaluation is determined to be of the following complexity level: LOW      Pain:  Pain Scale 1: Numeric (0 - 10)  Pain Intensity 1: 0              Activity Tolerance:   Good for seated OT session.  Good standing balance  SpO2 on Ra seated EOB 95%     After treatment:   '[ ]'$   Patient left in no apparent distress sitting up in chair  '[X]'$   Patient left in no apparent distress in bed  '[X]'$   Call bell left within reach  '[X]'$   Nursing notified  '[ ]'$   Caregiver present  '[ ]'$   Bed alarm activated      COMMUNICATION/EDUCATION:   The patient???s plan of care was discussed with: Registered Nurse and PAtient.     Patient was educated regarding His deficit(s) of L wrist/finger ROM/strength as this relates to His diagnosis of R CVA.  He demonstrated Good understanding as evidenced by participation in session.     '[X]'$       Home safety education was provided and the patient/caregiver indicated understanding.   '[X]'$       Patient/family have participated as able and agree with findings and recommendations.  '[ ]'$       Patient is unable to participate in plan of care at this time.     This patient???s plan of care is appropriate for delegation to OTA.     Thank you for this referral.  Seward Grater, OT  Time Calculation: 22 mins

## 2016-04-24 NOTE — Progress Notes (Signed)
Spiritual Care Partner Volunteer visited patient in [place] on 04/24/16.  Documented by:  Berneta Levins, M.Div.  Chaplain Paging Service [phone])

## 2016-04-24 NOTE — Progress Notes (Signed)
Care Management Interventions  MyChart Signup: No  Discharge Durable Medical Equipment: No  Physical Therapy Consult: Yes  Occupational Therapy Consult: Yes  Speech Therapy Consult: Yes  Current Support Network: Other (lives with significant other, and her nephew)  Confirm Follow Up Transport: Family  Plan discussed with Pt/Family/Caregiver: Yes  Freedom of Choice Offered: Yes  Discharge Location  Discharge Placement: Home with family assistance    Chart reviewed for transitions of care, discussed patient during rounds. Patient was admitted for abdominal pain, nausea and vomiting. He was diagnosed with pneumonia, left ureterohydronephrosis, and an abnormal CT scan (possible lung neoplasm).     Cm met with patient to explain role and offer support. Patient is self-pay, no PCP, retired last year. Patient confirmed that he lives with his 'friend' [name]) and her nephew Jaclyn Shaggy) who is listed as the emergency contact.     Patient confirmed he does not have a PCP nor does he have insurance. He did state that someone came to talk with him earlier about applying for medicaid, and was given information on it. Cm is unable to figure out who would have given that to him, message left for [facility].  Cm informed him I would be assisting him with his follow up care, specifically becoming an established patient at the [facility] and ensuring he had the medication he needed at discharge.    Cm will follow up with patient tomorrow.  [employer], [place]

## 2016-04-24 NOTE — Progress Notes (Signed)
Respiratory Educator    Pneumonia education provided to this 65yo male with pneumonia. See physician's notes. Purpose is to educate pt. on pneumonia preventive measures. Goal is to prevent readmission for the same.    S: "I'm OK with my breathing right now". Pt appears tired.    O/A: Pt is awake in bed on RA with O2 sats. of 98%, HR:78, RR: 16, BS: clear to. No Rhonchi, No rales, No wheezes. Cough is loose and non-productive. No accessory muscle use noted. No dyspnia, No cyanosis.   Pt states no prior history of respiratory illnesses, states no respiratory allergies.  Pt states he is not on any respiratory medications at home.  Pt states he currently smokes cigarettes, 1PPD. Pt stated he had stopped smoking for about 7 months with in the last year but started back up again. Pt states he has a desire to stop smoking and states "I know I need to stop smoking".     P: Pneumonia packet provided and contents reviewed  1. Reviewed pneumonia: what it is and affects on the lung  2. Discussed preventive measures as outlined in the packet:   Handwashing, vaccinations (PNA. FLU),   3. Discussed smoking cessation. Pt states  "I want to stop smoking". Acknowledges he may need help stopping smoking. Discussed nicotine patches and encouraged he have discussion with Dr regarding this.   4. Discussed importance of adhering to Drs orders and f/up after discharge in regards to his recovery and future tests that will be administered. (see physician's notes)  5. Contact information provided. Will f/up with pt.    Lynnell Dike, RRT  Respiratory care  Pulmonary Disease Case Manager

## 2016-04-24 NOTE — Progress Notes (Signed)
Spiritual Care Assessment/Progress Notes    Bruce Clark [id number]  [id number]    Jan 31, 1951  65 y.o.  male    Patient Telephone Number: [phone] (home)   Religious Affiliation: No preference   Language: English   Extended Emergency Contact Information  Primary Emergency Contact: [place] Phone: [phone]  Relation: Other Relative   Patient Active Problem List    Diagnosis Date Noted   ??? Pneumonia 04/22/2016        Date: 04/24/2016       Level of Religious/Spiritual Activity:           Involved in faith tradition/spiritual practice             Not involved in faith tradition/spiritual practice           Spiritually oriented             Claims no spiritual orientation             seeking spiritual identity           Feels alienated from religious practice/tradition           Feels angry about religious practice/tradition           Spirituality/religious tradition  a resource for coping at this time.           Not able to assess due to medical condition    Services Provided Today:           crisis intervention             reading Scriptures           spiritual assessment             prayer           empathic listening/emotional support           rites and rituals (cite in comments)           life review              religious support           theological development            advocacy           ethical dialog              blessing           bereavement support             support to family           anticipatory grief support            help with AMD           spiritual guidance             meditation      Spiritual Care Needs           Emotional Support           Spiritual/Religious Care           Loss/Adjustment           Advocacy/Referral                /Ethics           No needs expressed at               this time           Other: (note in  comments)  Spiritual Care Plan           Follow up visits with               pt/family            Provide materials           Schedule sacraments           Contact Community               Clergy           Follow up as needed           Other: (note in               comments)     Comments: Visited with patient per referral of Spiritual Care Partner (SCP). Provided active listening as patient updated chaplain on medical condition and hospital stay. Patient is in good spirits and has no specific needs at this time. Spent time reflecting on how recent medical conditions have impacted his life, most notably he has decided to quit smoking. Patient states he is well supported by his significant other and extended family and hopes to be discharged tomorrow. Advised of chaplain availability and assured of ongoing support as needed and desired.     Chaplain Clinton Quant, M.Div.  Chaplain Paging Service [phone])

## 2016-04-24 NOTE — Procedures (Signed)
[facility]  *** FINAL REPORT ***    Name: AKAASH, Bruce Clark  MRN: [id number]    Inpatient  DOB: [birth year]  HIS Order #: [id number]  TRAKnet Visit #: [id number]  Date: 22 Apr 2016    TYPE OF TEST: Peripheral Venous Testing    REASON FOR TEST  R/O DVT    Right Leg:-  Deep venous thrombosis:           Yes  Proximal extent of thrombus:      Common Femoral  Superficial venous thrombosis:    No  Deep venous insufficiency:        Not examined  Superficial venous insufficiency: Not examined      INTERPRETATION/FINDINGS  PROCEDURE:  Color duplex ultrasound imaging of lower extremity veins.    FINDINGS:       Right: Consistent with thrombosis involving the commen femoral,  deep femoral, femoral, popliteal, one of the two veins of posterior  tibial and peronial veins as demonstrated by vein partial  compressibility, and by a narrowing or occlusion of the flow channel  on color Doppler imaging.The great saphenous vein is patent and  without evidence of thrombus; it is is fully compressible and there is   no narrowing of the flow channel on color Doppler imaging.  Continious flow is observed in the common femoral vein.       Left:   The common femoral vein is patent and without evidence of   thrombus.  Phasic flow is observed.  This extremity was not otherwise   evaluated.    IMPRESSION:  There is evidence of vein thrombosis, as described above.    The ultrasound appearance is more consistent with an acute  than a  chronic process.    ADDITIONAL COMMENTS    I have personally reviewed the data relevant to the interpretation of  this  study.    TECHNOLOGIST: Rogene Houston, RVT  Signed: 04/22/2016 10:15 PM    PHYSICIAN: Luiz Ochoa, MD  Signed: 04/24/2016 07:14 AM

## 2016-04-24 NOTE — Progress Notes (Signed)
Incidental moderate left hydronephrosis on CT of low priority at this point.  Please arrange for outpatient urologic follow up once discharged.

## 2016-04-24 NOTE — Progress Notes (Signed)
Hospitalist Progress Note  Bruce Rota, MD  Office: [phone]        Date of Service:  04/24/2016  NAME:  Bruce Clark  DOB:  [birth year]  MRN:  [id number]      Admission Summary:   [name] presented with right sided chest pain,cough productive of mainly whitish sputum,fever.He was admitted for pneumonia and left leg DVT.He is a chronic active smoker,about 1 PPD.    Interval history / Subjective:     Seen in follow up for pneumonia,DVT  Feels better.     Assessment & Plan:   Community acquired pneumonia  -CTA chest was negative for PE,however revealed bilateral lung nodules,mediastinal and hilar LAP,peribronchial dz and ILD  -Continue ceftriaxone and Zithromax.    Possible lung neoplasm.Patient is chronic active smoker  -CT chest finding worrisome.He needs work up for malignancy.Pulmonary consulted.For PET scans,EBUS on hold due to anticoagulation for DVT  -    Cigarette smoking: counseled to quit.    Left leg DVT: on Lovenox.  -CTA has ruled out PE  -    Left hydroureteronephrosis  -CT A/P:Marked heterogeneous enlargement of the prostate gland. Moderate left  hydroureteronephrosis to the level of the mid ureter  -Urology saw and recommended out patient follow up      BPH: start flomax    Acute stroke with left UE weakness,patienr reported symptoms dated 2-3 weeks  -Add asa and Lipitor.Echo.lipids    Mild Hypercalcemia due to dehydration vs malignancy  -Calcium down to normal.high normal PTH      Mild hyponatremia due to prerenal,corrected with fluids.    Body mass index is 19.59 kg/(m^2).        Diet:regular  Code status: full  DVT prophylaxis: lovenox  Care Plan discussed with: patient    Hospital Problems  Date Reviewed: 05-05-2016          Codes Class Noted POA    Pneumonia ICD-10-CM: J18.9  ICD-9-CM: 672  2016-05-05 Unknown                Review of Systems:   A comprehensive review of systems was negative except for that written in the HPI.      Physical Examination:      Last 24hrs VS reviewed since prior progress note. Most recent are:  Visit Vitals   ??? BP 105/61 (BP 1 Location: Left arm, BP Patient Position: At rest)   ??? Pulse 75   ??? Temp 99 ??F (37.2 ??C)   ??? Resp 16   ??? Ht '5\' 6"'$  (1.676 m)   ??? Wt 55.1 kg (121 lb 6 oz)   ??? SpO2 97%   ??? BMI 19.59 kg/m2           Constitutional:  No acute distress, cooperative, pleasant??   Eyes: Non icteric,non pallor,no erythema,discharge,PERRLA   ENT:  Oral mucous moist, oropharynx benign. Neck supple,    Resp:  CTA bilaterally. No wheezing/rhonchi/rales. No accessory muscle use   CV:  Regular rhythm, normal rate, no murmurs, gallops, rubs    GI:  Soft, non distended, non tender. normoactive bowel sounds, no hepatosplenomegaly    GU:  No CVA or suprapubic tenderness   Skin  :  No erythema,rash,bullae,dipigmentation     Musculoskeletal:  No edema, warm, 2+ pulses throughout    Neurologic:  AAOx3, CN II-XII reviewed.LUE 4/5     Psych:  Good insight, Not anxious nor agitated.         Intake/Output Summary (Last 24 hours)  at 04/24/16 1544  Last data filed at 04/24/16 1245   Gross per 24 hour   Intake             1705 ml   Output             1075 ml   Net              630 ml          Data Review:    Review and/or order of clinical lab test  Review and/or order of tests in the radiology section of CPT  Review and/or order of tests in the medicine section of CPT      Labs:     Recent Labs      04/23/16   0359  04/22/16   1617   WBC  6.6  8.2   HGB  13.4  14.7   HCT  38.1  40.6   PLT  253  289     Recent Labs      04/24/16   0419  04/23/16   0359  04/22/16   1617   NA  136  135*  131*   K  4.0  4.1  4.3   CL  104  103  100   CO2  '22  23  23   '$ BUN  '6  10  13   '$ CREA  0.45*  0.59*  0.59*   GLU  80  71  109*   CA  9.5   9.8  9.5  10.2*     Recent Labs      04/24/16   0419  04/22/16   1617   SGOT  58*  24   ALT  11*  13   AP  99  96   TBILI  0.3  0.4   TP  6.6  7.8   ALB  2.9*  3.3*   GLOB  3.7  4.5*   LPSE   --   100      No results for input(s): INR, PTP, APTT in the last 72 hours.    No lab exists for component: INREXT, INREXT   No results for input(s): FE, TIBC, PSAT, FERR in the last 72 hours.   No results found for: FOL, RBCF   No results for input(s): PH, PCO2, PO2 in the last 72 hours.  Recent Labs      04/23/16   0359  04/22/16   1617   CPK   --   94   CKNDX   --   Cannot be calculated   TROIQ  <0.04  <0.04     Lab Results   Component Value Date/Time    Cholesterol, total 140 04/23/2016 03:59 AM    HDL Cholesterol 59 04/23/2016 03:59 AM    LDL, calculated 70.4 04/23/2016 03:59 AM    Triglyceride 53 04/23/2016 03:59 AM    CHOL/HDL Ratio 2.4 04/23/2016 03:59 AM     Lab Results   Component Value Date/Time    Glucose (POC) 135 04/24/2016 11:20 AM    Glucose (POC) 98 04/23/2016 09:46 PM    Glucose (POC) 104 04/23/2016 06:35 PM    Glucose (POC) 117 04/23/2016 11:47 AM     Lab Results   Component Value Date/Time    Color YELLOW/STRAW 04/22/2016 08:22 PM    Appearance CLEAR 04/22/2016 08:22 PM    Specific gravity 1.025 04/22/2016 08:22 PM    pH (UA) 5.5 04/22/2016 08:22 PM  Protein NEGATIVE  04/22/2016 08:22 PM    Glucose NEGATIVE  04/22/2016 08:22 PM    Ketone TRACE 04/22/2016 08:22 PM    Urobilinogen 1.0 04/22/2016 08:22 PM    Nitrites NEGATIVE  04/22/2016 08:22 PM    Leukocyte Esterase NEGATIVE  04/22/2016 08:22 PM    Epithelial cells FEW 04/22/2016 08:22 PM    Bacteria NEGATIVE  04/22/2016 08:22 PM    WBC 0-4 04/22/2016 08:22 PM    RBC 0-5 04/22/2016 08:22 PM         Medications Reviewed:     Current Facility-Administered Medications   Medication Dose Route Frequency   ??? sodium chloride (NS) flush 5-10 mL  5-10 mL IntraVENous Q8H   ??? sodium chloride (NS) flush 5-10 mL  5-10 mL IntraVENous PRN   ??? cefTRIAXone (ROCEPHIN) 1 g in 0.9% sodium chloride (MBP/ADV) 50 mL  1 g IntraVENous Q24H   ??? azithromycin (ZITHROMAX) 500 mg in 0.9% sodium chloride (MBP/ADV) 250 mL  500 mg IntraVENous Q24H    ??? acetaminophen (TYLENOL) tablet 650 mg  650 mg Oral Q4H PRN   ??? aspirin chewable tablet 81 mg  81 mg Oral DAILY   ??? 0.9% sodium chloride infusion  75 mL/hr IntraVENous CONTINUOUS   ??? bisacodyl (DULCOLAX) tablet 5 mg  5 mg Oral DAILY PRN   ??? enoxaparin (LOVENOX) injection 50 mg  50 mg SubCUTAneous Q12H     ______________________________________________________________________  EXPECTED LENGTH OF STAY: 3d 14h  ACTUAL LENGTH OF STAY:          2                 [id number] Sonnie Alamo, MD

## 2016-04-24 NOTE — Progress Notes (Signed)
Bedside shift change report given to [place], Therapist, sports (oncoming nurse) by Nicholes Rough, RN (offgoing nurse). Report included the following information SBAR, Kardex, Procedure Summary, Intake/Output, Recent Results and Cardiac Rhythm NSR.

## 2016-04-24 NOTE — Progress Notes (Signed)
Note recorded on 04/24/16:    [facility] Partner Volunteer visited patient in [place] on 04/23/16.  Documented by:  Berneta Levins, M.Div.  Chaplain Paging Service [phone])

## 2016-04-24 NOTE — Procedures (Signed)
[facility]  *** FINAL REPORT ***    Name: Bruce Clark, Bruce Clark  MRN: [id number]    Inpatient  DOB: November 19, 1950  HIS Order #: [id number]  TRAKnet Visit #: [id number]  Date: 22 Apr 2016    TYPE OF TEST: Peripheral Venous Testing    REASON FOR TEST  R/O DVT    Right Leg:-  Deep venous thrombosis:           Yes  Proximal extent of thrombus:      Common Femoral  Superficial venous thrombosis:    No  Deep venous insufficiency:        Not examined  Superficial venous insufficiency: Not examined      INTERPRETATION/FINDINGS  PROCEDURE:  Color duplex ultrasound imaging of lower extremity veins.    FINDINGS:       Right: Consistent with thrombosis involving the commen femoral,  deep femoral, femoral, popliteal, one of the two veins of posterior  tibial and peronial veins as demonstrated by vein partial  compressibility, and by a narrowing or occlusion of the flow channel  on color Doppler imaging.The great saphenous vein is patent and  without evidence of thrombus; it is is fully compressible and there is   no narrowing of the flow channel on color Doppler imaging.  Continious flow is observed in the common femoral vein.       Left:   The common femoral vein is patent and without evidence of   thrombus.  Phasic flow is observed.  This extremity was not otherwise   evaluated.    IMPRESSION:  There is evidence of vein thrombosis, as described above.    The ultrasound appearance is more consistent with an acute  than a  chronic process.    ADDITIONAL COMMENTS    I have personally reviewed the data relevant to the interpretation of  this  study.    TECHNOLOGIST: Rogene Houston, RVT  Signed: 04/22/2016 10:15 PM    PHYSICIAN: Luiz Ochoa, MD  Signed: 04/24/2016 07:14 AM

## 2016-04-24 NOTE — Procedures (Signed)
[place] [address]   PULMONARY FUNCTION       Name:  Bruce Clark, Bruce Clark   MR#:  [id number]   DOB:  1950/11/09   Account #:  [id number]    Date of Procedure:  04/24/2016   Date of Adm:  04/22/2016       REQUESTING PHYSICIAN: Barrett L. Lovena Le, PA-C     INDICATIONS FOR PROCEDURE: Shortness of breath, former   smoker.    Spirometry reveals obstructive physiology with possible superimposed   restrictive physiology, versus air trapping. Forced vital capacity is   mildly reduced, FEV1 is moderately reduced, and mid expiratory flow is   severely reduced. The ratio suggests obstructive physiology, as does   flow loop. Clinical correlation is recommended, but the patient can   have further definition of this abnormality with pre- and post   bronchodilators in the lab, lung volumes, and diffusion as clinically   indicated.        Gwenyth Allegra, MD      JAA / Hiram Comber   D:  04/25/2016   16:41   T:  04/25/2016   21:46   Job #:  [id number]

## 2016-04-25 LAB — ECHOCARDIOGRAM COMPLETE 2D W DOPPLER W COLOR: Left Ventricular Ejection Fraction: 58

## 2016-04-25 LAB — CULTURE, RESPIRATORY/SPUTUM/BRONCH W GRAM STAIN: Culture result:: NORMAL

## 2016-04-25 LAB — GLUCOSE, POC
Glucose (POC): 148 mg/dL — ABNORMAL HIGH (ref 65–100)
Glucose (POC): 117 mg/dL — ABNORMAL HIGH (ref 65–100)

## 2016-04-25 MED ORDER — ATORVASTATIN 20 MG TAB
20 mg | Freq: Every evening | ORAL | Status: DC
Start: 2016-04-25 — End: 2016-04-24

## 2016-04-25 MED FILL — LOVENOX 60 MG/0.6 ML SUBCUTANEOUS SYRINGE: 60 mg/0.6 mL | SUBCUTANEOUS | Qty: 0.6

## 2016-04-25 MED FILL — NORMAL SALINE FLUSH 0.9 % INJECTION SYRINGE: INTRAMUSCULAR | Qty: 10

## 2016-04-25 MED FILL — CHILDREN'S ASPIRIN 81 MG CHEWABLE TABLET: 81 mg | ORAL | Qty: 1

## 2016-04-25 MED FILL — CEFTRIAXONE 1 GRAM SOLUTION FOR INJECTION: 1 gram | INTRAMUSCULAR | Qty: 1

## 2016-04-25 MED FILL — AZITHROMYCIN 500 MG IV SOLUTION: 500 mg | INTRAVENOUS | Qty: 5

## 2016-04-25 NOTE — Progress Notes (Signed)
Hospitalist Progress Note  Carolee Rota, MD  Office: [phone]        Date of Service:  04/25/2016  NAME:  Bruce Clark  DOB:  Jul 21, 1951  MRN:  [id number]      Admission Summary:   Mr Hawes presented with right sided chest pain,cough productive of mainly whitish sputum,fever.He was admitted for pneumonia and left leg DVT.He is a chronic active smoker,about 1 PPD.    Interval history / Subjective:     Seen in follow up for pneumonia,DVT  Feels better.     Assessment & Plan:   Community acquired pneumonia  -CTA chest was negative for PE,however revealed bilateral lung nodules,mediastinal and hilar LAP,peribronchial dz and ILD  -Continue ceftriaxone and Zithromax.    Possible lung neoplasm.Patient is chronic active smoker  -CT chest finding worrisome.He needs work up for malignancy.Pulmonary consulted.For PET scans,EBUS on hold due to anticoagulation for DVT  -PET scan Monday    Cigarette smoking: counseled to quit.    Left leg DVT: on Lovenox.  -CTA has ruled out PE  -Weighing between Warfarin vs NOACs.He is uninsured,does not have PCP.Case management helping to get him to [facility] over clinic.    Left hydroureteronephrosis  -CT A/P:Marked heterogeneous enlargement of the prostate gland. Moderate left  hydroureteronephrosis to the level of the mid ureter  -Urology saw and recommended out patient follow up      BPH: start flomax    Acute stroke with left UE weakness,patienr reported symptoms dated 2-3 weeks  -Add asa.LDL 70.  -Echocardiogram unremarkable.CUS<50% bilaterally.    Mild Hypercalcemia due to dehydration vs malignancy  -Calcium down to normal.high normal PTH      Mild hyponatremia due to prerenal,corrected with fluids.    Body mass index is 19.59 kg/(m^2).        Diet:regular  Code status: full  DVT prophylaxis: lovenox  Care Plan discussed with: Uninsured.PET scan Monday and discharge  afterwards.He would follow at the [facility] clinic.He needs a one month supply of anticoagulation on discharge.    Hospital Problems  Date Reviewed: May 09, 2016          Codes Class Noted POA    Pneumonia ICD-10-CM: J18.9  ICD-9-CM: 361  05-09-2016 Unknown                Review of Systems:   A comprehensive review of systems was negative except for that written in the HPI.     Physical Examination:      Last 24hrs VS reviewed since prior progress note. Most recent are:  Visit Vitals   ??? BP 96/65 (BP 1 Location: Left arm, BP Patient Position: At rest)   ??? Pulse 74   ??? Temp 98.7 ??F (37.1 ??C)   ??? Resp 16   ??? Ht '5\' 6"'$  (1.676 m)   ??? Wt 55.1 kg (121 lb 6 oz)   ??? SpO2 97%   ??? BMI 19.59 kg/m2           Constitutional:  No acute distress, cooperative, pleasant??   Eyes: Non icteric,non pallor,no erythema,discharge,PERRLA   ENT:  Oral mucous moist, oropharynx benign. Neck supple,    Resp:  CTA bilaterally. No wheezing/rhonchi/rales. No accessory muscle use   CV:  Regular rhythm, normal rate, no murmurs, gallops, rubs    GI:  Soft, non distended, non tender. normoactive bowel sounds, no hepatosplenomegaly    GU:  No CVA or suprapubic tenderness   Skin  :  No erythema,rash,bullae,dipigmentation  Musculoskeletal:  No edema, warm, 2+ pulses throughout    Neurologic:  AAOx3, CN II-XII reviewed.LUE 4/5     Psych:  Good insight, Not anxious nor agitated.         Intake/Output Summary (Last 24 hours) at 04/25/16 1717  Last data filed at 04/25/16 1605   Gross per 24 hour   Intake                0 ml   Output              900 ml   Net             -900 ml          Data Review:    Review and/or order of clinical lab test  Review and/or order of tests in the radiology section of CPT  Review and/or order of tests in the medicine section of CPT      Labs:     Recent Labs      04/23/16   0359   WBC  6.6   HGB  13.4   HCT  38.1   PLT  253     Recent Labs      04/24/16   0419  04/23/16   0359   NA  136  135*   K  4.0  4.1   CL  104  103    CO2  22  23   BUN  6  10   CREA  0.45*  0.59*   GLU  80  71   CA  9.5   9.8  9.5     Recent Labs      04/24/16   0419   SGOT  58*   ALT  11*   AP  99   TBILI  0.3   TP  6.6   ALB  2.9*   GLOB  3.7     No results for input(s): INR, PTP, APTT in the last 72 hours.    No lab exists for component: INREXT, INREXT   No results for input(s): FE, TIBC, PSAT, FERR in the last 72 hours.   No results found for: FOL, RBCF   No results for input(s): PH, PCO2, PO2 in the last 72 hours.  Recent Labs      04/23/16   0359   TROIQ  <0.04     Lab Results   Component Value Date/Time    Cholesterol, total 140 04/23/2016 03:59 AM    HDL Cholesterol 59 04/23/2016 03:59 AM    LDL, calculated 70.4 04/23/2016 03:59 AM    Triglyceride 53 04/23/2016 03:59 AM    CHOL/HDL Ratio 2.4 04/23/2016 03:59 AM     Lab Results   Component Value Date/Time    Glucose (POC) 117 04/25/2016 04:52 PM    Glucose (POC) 148 04/25/2016 11:22 AM    Glucose (POC) 108 04/24/2016 04:45 PM    Glucose (POC) 135 04/24/2016 11:20 AM    Glucose (POC) 98 04/23/2016 09:46 PM     Lab Results   Component Value Date/Time    Color YELLOW/STRAW 04/22/2016 08:22 PM    Appearance CLEAR 04/22/2016 08:22 PM    Specific gravity 1.025 04/22/2016 08:22 PM    pH (UA) 5.5 04/22/2016 08:22 PM    Protein NEGATIVE  04/22/2016 08:22 PM    Glucose NEGATIVE  04/22/2016 08:22 PM    Ketone TRACE 04/22/2016 08:22 PM    Urobilinogen 1.0 04/22/2016 08:22 PM  Nitrites NEGATIVE  04/22/2016 08:22 PM    Leukocyte Esterase NEGATIVE  04/22/2016 08:22 PM    Epithelial cells FEW 04/22/2016 08:22 PM    Bacteria NEGATIVE  04/22/2016 08:22 PM    WBC 0-4 04/22/2016 08:22 PM    RBC 0-5 04/22/2016 08:22 PM         Medications Reviewed:     Current Facility-Administered Medications   Medication Dose Route Frequency   ??? sodium chloride (NS) flush 5-10 mL  5-10 mL IntraVENous Q8H   ??? sodium chloride (NS) flush 5-10 mL  5-10 mL IntraVENous PRN    ??? cefTRIAXone (ROCEPHIN) 1 g in 0.9% sodium chloride (MBP/ADV) 50 mL  1 g IntraVENous Q24H   ??? azithromycin (ZITHROMAX) 500 mg in 0.9% sodium chloride (MBP/ADV) 250 mL  500 mg IntraVENous Q24H   ??? acetaminophen (TYLENOL) tablet 650 mg  650 mg Oral Q4H PRN   ??? aspirin chewable tablet 81 mg  81 mg Oral DAILY   ??? 0.9% sodium chloride infusion  75 mL/hr IntraVENous CONTINUOUS   ??? bisacodyl (DULCOLAX) tablet 5 mg  5 mg Oral DAILY PRN   ??? enoxaparin (LOVENOX) injection 50 mg  50 mg SubCUTAneous Q12H     ______________________________________________________________________  EXPECTED LENGTH OF STAY: 3d 14h  ACTUAL LENGTH OF STAY:          3                 [id number] Sonnie Alamo, MD

## 2016-04-25 NOTE — Progress Notes (Signed)
Pulmonary, Critical Care, and Sleep Medicine~Progress Note    Name: Bruce Clark MRN: [id number]   DOB: October 23, 1950 Hospital: [facility]   Date: 04/25/2016 10:38 AM Admission: 04/22/2016     IMPRESSION:   ?? Abnormal CT scan; 9/6 (no prior films for review); 2.7 in RLL, 2.1cm x 2 in LLL, diffuse peribronchial thickening and interstitial disease bilaterally, bilateral hilar/mediastinal lymph nodes largest 3.3 cm in right hilar  ?? Former smoker  ?? Hx of hernia repair   ?? Right DVT      PLAN:   ?? Currently on Lovenox; financial status will impact anticoagulation decision  ?? spirometry   ?? On azithromycin/rocephin  ?? PET on Monday if he stays (order is in and discussed with RN to schedule it), review for secondary site to bx as well.   ?? O2 titration above 90%  ?? Discussed with hospitalist/ attending   ?? PRN over the weekend      Daily Progression:    No acute dyspnea or chest pains     OBJECTIVE:     Vital Signs:       Visit Vitals   ??? BP 90/59   ??? Pulse 100   ??? Temp 99 ??F (37.2 ??C)   ??? Resp 16   ??? Ht '5\' 6"'$  (1.676 m)   ??? Wt 55.1 kg (121 lb 6 oz)   ??? SpO2 96%   ??? BMI 19.59 kg/m2      Temp (24hrs), Avg:99.1 ??F (37.3 ??C), Min:98.8 ??F (37.1 ??C), Max:99.4 ??F (37.4 ??C)     Intake/Output:     Last shift:      Last 3 shifts: 09/06 1901 - 09/08 0700  In: 298.8 [I.V.:298.8]  Out: 1308 [Urine:1875]          Intake/Output Summary (Last 24 hours) at 04/25/16 1108  Last data filed at 04/25/16 0252   Gross per 24 hour   Intake                0 ml   Output              975 ml   Net             -975 ml       Physical Exam:                                        Exam Findings Other   General: No resp distress noted, appears stated age    HEENT:  No ulcers, JVD not elevated, no cervical LAD    Chest: No pectus deformity, normal chest rise b/l    HEART:  RRR, no murmurs/rubs/gallops    Lungs:  CTA b/l, no rhonchi/crackles/wheeze, diminished BS at bases     ABD: Soft/NT, non rigid mildly distended    EXT: No cyanosis/clubbing/edema, normal peripheral pulses    Skin: No rashes or ulcers, no mottling    Neuro: A/O x 3        Medications:  Current Facility-Administered Medications   Medication Dose Route Frequency   ??? sodium chloride (NS) flush 5-10 mL  5-10 mL IntraVENous Q8H   ??? sodium chloride (NS) flush 5-10 mL  5-10 mL IntraVENous PRN   ??? cefTRIAXone (ROCEPHIN) 1 g in 0.9% sodium chloride (MBP/ADV) 50 mL  1 g IntraVENous Q24H   ??? azithromycin (ZITHROMAX) 500 mg in 0.9% sodium chloride (  MBP/ADV) 250 mL  500 mg IntraVENous Q24H   ??? acetaminophen (TYLENOL) tablet 650 mg  650 mg Oral Q4H PRN   ??? aspirin chewable tablet 81 mg  81 mg Oral DAILY   ??? 0.9% sodium chloride infusion  75 mL/hr IntraVENous CONTINUOUS   ??? bisacodyl (DULCOLAX) tablet 5 mg  5 mg Oral DAILY PRN   ??? enoxaparin (LOVENOX) injection 50 mg  50 mg SubCUTAneous Q12H       Labs:  ABG No results for input(s): PHI, PCO2I, PO2I, HCO3I, SO2I, FIO2I in the last 72 hours.     CBC Recent Labs      04/23/16   0359  04/22/16   1617   WBC  6.6  8.2   HGB  13.4  14.7   HCT  38.1  40.6   PLT  253  289   MCV  86.0  84.6   MCH  30.2  17.6        Metabolic  Panel Recent Labs      04/24/16   0419  04/23/16   0359  04/22/16   1617   NA  136  135*  131*   K  4.0  4.1  4.3   CL  104  103  100   CO2  '22  23  23   '$ GLU  80  71  109*   BUN  '6  10  13   '$ CREA  0.45*  0.59*  0.59*   CA  9.5   9.8  9.5  10.2*   ALB  2.9*   --   3.3*   SGOT  58*   --   24   ALT  11*   --   13        Pertinent Labs                Lamone Ferrelli [place], [place]  04/25/2016

## 2016-04-25 NOTE — Procedures (Signed)
[place] [address]   PULMONARY FUNCTION       Name:  Bruce Clark, Bruce Clark   MR#:  [id number]   DOB:  [birth year]   Account #:  [id number]    Date of Procedure:  04/24/2016   Date of Adm:  04/22/2016       REQUESTING PHYSICIAN: Barrett L. Lovena Le, PA-C     INDICATIONS FOR PROCEDURE: Shortness of breath, former   smoker.    Spirometry reveals obstructive physiology with possible superimposed   restrictive physiology, versus air trapping. Forced vital capacity is   mildly reduced, FEV1 is moderately reduced, and mid expiratory flow is   severely reduced. The ratio suggests obstructive physiology, as does   flow loop. Clinical correlation is recommended, but the patient can   have further definition of this abnormality with pre- and post   bronchodilators in the lab, lung volumes, and diffusion as clinically   indicated.        Gwenyth Allegra, MD      JAA / Hiram Comber   D:  04/25/2016   16:41   T:  04/25/2016   21:46   Job #:  [id number]

## 2016-04-25 NOTE — Progress Notes (Addendum)
Message left for the pharmacy department at [facility] to return my call regarding this patient. Patient will need a blood thinner at discharge, and cm would like to see if they have eliquis, xarelto or another medication available.  Caitlin Helton BSW, ACM    3:10 pm: [facility] pharmacy returned my call. They stated that regardless of which medication patient is discharged on, they will have to order the medicine and it will take about one month for it to be delivered. Cm informed him that we would provide patient with a one month supply. Cm asked him if they have a preference of eliquis over xaralto, and he stated it did not make a difference. Cm will continue to follow.  [employer], [place]

## 2016-04-25 NOTE — Progress Notes (Signed)
Echo completed

## 2016-04-25 NOTE — Progress Notes (Addendum)
Problem: Self Care Deficits Care Plan (Adult)  Goal: *Acute Goals and Plan of Care (Insert Text)  Occupational Therapy Goals  Initiated 04/24/2016   1. Patient will perform grooming with LUE standing at sink with modified independence within 7 day(s).  2. Patient will perform self-feeding with LUE with assistance from RUE PRN for container mgmt with modified independence within 7 day(s).  3. Patient will participate in L upper extremity therapeutic exercise/activities with modified independence for 15 minutes within 7 day(s).   4. Patient will utilize energy conservation techniques during functional activities with verbal cues within 7 day(s).  5. Patient will improve their Fugl Meyer score by 5 points within 7 days.   OCCUPATIONAL THERAPY TREATMENT  Patient: Bruce Clark (65 y.o. male)  Date: 04/25/2016  Diagnosis: Abnormal chest x-ray [R93.8] <principal problem not specified>  Procedure(s) (LRB):  BRONCHOSCOPY with EBUS  (N/A)    Precautions:        ASSESSMENT:  Pt is making good progress towards goals.  Pt with small R frontal CVA with only noted impairments in L wrist extension and only when digits are extended.  This impairment appears more peripheral than central injury.  Recommend L OTC wrist support splint to improve position of wrist for hand function.  Pt provided with wrist support splint and demonstrated good understanding. In addition, initiated LUE WBing home program in sitting and standing and pt verbalized good understanding.  Recommend OP neuro OT at discharge for L wrist impairments if they do not resolve.    Progression toward goals:  '[X]'$        Improving appropriately and progressing toward goals  '[ ]'$        Improving slowly and progressing toward goals  '[ ]'$        Not making progress toward goals and plan of care will be adjusted       PLAN:  Patient continues to benefit from skilled intervention to address the above impairments.  Continue treatment per established plan of care.   Discharge Recommendations:  Outpatient neuro OT  Further Equipment Recommendations for Discharge:  L OTC wrist support splint for wrist drop       SUBJECTIVE:   Patient stated ???I am much better.???      OBJECTIVE DATA SUMMARY:   Cognitive/Behavioral Status:  Neurologic State: Alert;Appropriate for age  Orientation Level: Oriented X4  Cognition: Appropriate for age attention/concentration;Follows commands  Perception: Appears intact  Perseveration: No perseveration noted  Safety/Judgement: Awareness of environment;Fall prevention;Insight into deficits     Functional Mobility and Transfers for ADLs:  Bed Mobility:  Supine to Sit: Independent  Sit to Supine: Independent     Transfers:  Sit to Stand: Supervision        Balance:  Sitting: Intact  Standing: Intact     ADL Intervention:  Recommend L OTC wrist support splint to improve position of wrist for hand function.    Splinting Intervention:                Therapeutic Wearing Schedule:   This is a new splint.  Occupational Therapist will check skin integrity in 1 hour(s) to verify correct fit.    Splint Education:  The patient's splint wear schedule was posted in room, in bedside, and nursing was notified. Patient instructed and indicated understanding of splint by hand washing with warm soapy water and air drying.  The patient was instructed to check their skin frequently for redness and irritation and to inform their occupational therapist of such  problems so that the splint can be adjusted as necessary.  The patient verbalized/demonstrated Fair overall understanding of the splinting education provided.      Duration        Comments   Day time only                            Night time only                           At all times                           As tolerated                           During Stressful Activities                           Remove for prescribed exercises                           Keep elevated to reduce edema                            Remove at least once per shift for skin assessment                             Other:                                                                Cognitive Retraining  Safety/Judgement: Awareness of environment;Fall prevention;Insight into deficits     Neuro Re-Education:  Initiated LUE WBing home program in sitting and standing and pt verbalized good understanding.  Pt performed seated reaching tasks and standing modified push ups on counter top and wall at 2 sets 10 reps each.  Encouraged pt to perform daily and he agreed.        Pain:  Pain Scale 1: Numeric (0 - 10)  Pain Intensity 1: 0              Activity Tolerance:   Good  Please refer to the flowsheet for vital signs taken during this treatment.  After treatment:   '[ ]'$  Patient left in no apparent distress sitting up in chair  '[X]'$  Patient left in no apparent distress in bed  '[X]'$  Call bell left within reach  '[X]'$  Nursing notified  '[ ]'$  Caregiver present  '[ ]'$  Bed alarm activated      COMMUNICATION/COLLABORATION:   The patient???s plan of care was discussed with: Physical Therapist, Registered Nurse and [place], OT  Time Calculation: 25 mins

## 2016-04-25 NOTE — Progress Notes (Signed)
Bedside shift change report given to Donnita Falls RN (oncoming nurse) by Ginnie Smart (offgoing nurse). Report included the following information SBAR, Kardex, MAR, Accordion and Recent Results.

## 2016-04-25 NOTE — Progress Notes (Signed)
Problem: Falls - Risk of  Goal: *Absence of Falls  Document Schmid Fall Risk and appropriate interventions in the flowsheet.   Outcome: Progressing Towards Goal  Fall Risk Interventions:

## 2016-04-26 LAB — GLUCOSE, POC
Glucose (POC): 106 mg/dL — ABNORMAL HIGH (ref 65–100)
Glucose (POC): 71 mg/dL (ref 65–100)

## 2016-04-26 MED ORDER — TAMSULOSIN SR 0.4 MG 24 HR CAP
0.4 mg | Freq: Every day | ORAL | Status: DC
Start: 2016-04-26 — End: 2016-05-01
  Administered 2016-04-26 – 2016-04-29 (×4): via ORAL

## 2016-04-26 MED ORDER — ATORVASTATIN 20 MG TAB
20 mg | Freq: Every day | ORAL | Status: DC
Start: 2016-04-26 — End: 2016-05-01
  Administered 2016-04-26 – 2016-05-01 (×6): via ORAL

## 2016-04-26 MED FILL — NORMAL SALINE FLUSH 0.9 % INJECTION SYRINGE: INTRAMUSCULAR | Qty: 10

## 2016-04-26 MED FILL — LOVENOX 60 MG/0.6 ML SUBCUTANEOUS SYRINGE: 60 mg/0.6 mL | SUBCUTANEOUS | Qty: 0.6

## 2016-04-26 MED FILL — TAMSULOSIN SR 0.4 MG 24 HR CAP: 0.4 mg | ORAL | Qty: 1

## 2016-04-26 MED FILL — LIPITOR 20 MG TABLET: 20 mg | ORAL | Qty: 1

## 2016-04-26 MED FILL — CHILDREN'S ASPIRIN 81 MG CHEWABLE TABLET: 81 mg | ORAL | Qty: 1

## 2016-04-26 MED FILL — AZITHROMYCIN 500 MG IV SOLUTION: 500 mg | INTRAVENOUS | Qty: 5

## 2016-04-26 MED FILL — CEFTRIAXONE 1 GRAM SOLUTION FOR INJECTION: 1 gram | INTRAMUSCULAR | Qty: 1

## 2016-04-26 NOTE — Other (Signed)
Bedside and Verbal shift change report given to Coleridge (oncoming nurse) by Parks Ranger (offgoing nurse). Report included the following information SBAR, Kardex, Intake/Output, MAR and Recent Results.

## 2016-04-26 NOTE — Progress Notes (Signed)
Problem: Falls - Risk of  Goal: *Absence of Falls  Document Schmid Fall Risk and appropriate interventions in the flowsheet.   Outcome: Progressing Towards Goal  Fall Risk Interventions:

## 2016-04-26 NOTE — Progress Notes (Signed)
Hospitalist Progress Note          Date of Service:  04/26/2016  NAME:  Bruce Clark  DOB:  19-Mar-1951  MRN:  [id number]      Admission Summary:   Mr Mccrone presented with right sided chest pain,cough productive of mainly whitish sputum,fever.He was admitted for pneumonia and left leg DVT.He is a chronic active smoker,about 1 PPD.    Interval history / Subjective:   No complaints, he is aware of the tentative plan.     Assessment & Plan:   Community acquired pneumonia  -CTA chest was negative for PE,however revealed bilateral lung nodules,mediastinal and hilar LAP,peribronchial dz and ILD  -Continue ceftriaxone and Zithromax.    Possible lung neoplasm.Patient is chronic active smoker  -CT chest finding worrisome.He needs work up for malignancy.Pulmonary consulted.For PET scans,EBUS on hold due to anticoagulation for DVT  -PET scan Monday    9/8 Echo EF 55-60%, suboptimal visualization    9/8 carotid dopplers prelim: less than 50% stenosis and antegrade flow    9/8 Pulmonary note/procedure note:  ?? "--Currently on Lovenox; financial status will impact anticoagulation decision  ?? spirometry   ?? On azithromycin/rocephin  ?? PET on Monday if he stays (order is in and discussed with RN to schedule it), review for secondary site to bx as well.   ?? O2 titration above 90%  ?? Discussed with hospitalist/ attending   PRN over the weekend "  "Spirometry reveals obstructive physiology with possible superimposed   restrictive physiology, versus air trapping. Forced vital capacity is   mildly reduced, FEV1 is moderately reduced, and mid expiratory flow is   severely reduced. The ratio suggests obstructive physiology, as does   flow loop. Clinical correlation is recommended, but the patient can   have further definition of this abnormality with pre- and post   bronchodilators in the lab, lung volumes, and diffusion as clinically   Indicated."     Cigarette smoking: counseled to quit during hospital stay    Left leg DVT: on Lovenox.  -CTA has ruled out PE  -Weighing between Warfarin vs NOACs.He is uninsured,does not have PCP.Case management helping to get him to [facility] over clinic.    Left hydroureteronephrosis  -CT A/P:Marked heterogeneous enlargement of the prostate gland. Moderate left  hydroureteronephrosis to the level of the mid ureter  -Urology saw and recommended outpatient follow up     BPH: on flomax    Acute stroke with left UE weakness,patienr reported symptoms dated 2-3 weeks  9/8 Echo EF 55-60%, suboptimal visualization9/8 carotid dopplers prelim: less than 50% stenosis and antegrade flow      Body mass index is 19.59 kg/(m^2).        Diet:regular  Code status: full  DVT prophylaxis: lovenox treatment dose for reasons noted above.  Care Plan discussed with: Uninsured.PET scan Monday and discharge afterwards.He would follow at the [facility].He needs a one month supply of anticoagulation on discharge.    Hospital Problems  Date Reviewed: 05/12/2016          Codes Class Noted POA    Pneumonia ICD-10-CM: J18.9  ICD-9-CM: 151  2016/05/12 Unknown                Review of Systems:   A comprehensive review of systems was negative except for that written in the HPI.     Physical Examination:      Last 24hrs VS reviewed since prior progress note. Most recent are:  Visit Vitals   ???  BP 105/76   ??? Pulse 84   ??? Temp 98.7 ??F (37.1 ??C)   ??? Resp 18   ??? Ht '5\' 6"'$  (1.676 m)   ??? Wt 55.1 kg (121 lb 6 oz)   ??? SpO2 96%   ??? BMI 19.59 kg/m2           Constitutional:  No acute distress, cooperative, pleasant??   Eyes: Non icteric,non pallor,no erythema,discharge   ENT:  Oral mucous moist, oropharynx benign. Neck supple,    Resp:  CTA bilaterally. No wheezing/rhonchi/rales. No accessory muscle use   CV:  Regular rhythm, normal rate, no murmurs, gallops, rubs    GI:  Soft, non distended, non tender. normoactive bowel sounds, no hepatosplenomegaly     GU:  No CVA or suprapubic tenderness   Skin  :  No erythema,rash,bullae,dipigmentation     Musculoskeletal:  No edema, warm, 2+ pulses throughout    Neurologic:  AAOx3, CN II-XII reviewed. No slurring of speech.     Psych:  Good insight, Not anxious nor agitated.         Intake/Output Summary (Last 24 hours) at 04/26/16 0906  Last data filed at 04/26/16 0514   Gross per 24 hour   Intake                0 ml   Output             1100 ml   Net            -1100 ml     Labs:     No results for input(s): WBC, HGB, HCT, PLT, HGBEXT, HCTEXT, PLTEXT, HGBEXT, HCTEXT, PLTEXT in the last 72 hours.  Recent Labs      04/24/16   0419   NA  136   K  4.0   CL  104   CO2  22   BUN  6   CREA  0.45*   GLU  80   CA  9.5   9.8     Recent Labs      04/24/16   0419   SGOT  58*   ALT  11*   AP  99   TBILI  0.3   TP  6.6   ALB  2.9*   GLOB  3.7       Lab Results   Component Value Date/Time    Cholesterol, total 140 04/23/2016 03:59 AM    HDL Cholesterol 59 04/23/2016 03:59 AM    LDL, calculated 70.4 04/23/2016 03:59 AM    Triglyceride 53 04/23/2016 03:59 AM    CHOL/HDL Ratio 2.4 04/23/2016 03:59 AM     Lab Results   Component Value Date/Time    Glucose (POC) 117 04/25/2016 04:52 PM    Glucose (POC) 148 04/25/2016 11:22 AM    Glucose (POC) 108 04/24/2016 04:45 PM    Glucose (POC) 135 04/24/2016 11:20 AM    Glucose (POC) 98 04/23/2016 09:46 PM     Lab Results   Component Value Date/Time    Color YELLOW/STRAW 04/22/2016 08:22 PM    Appearance CLEAR 04/22/2016 08:22 PM    Specific gravity 1.025 04/22/2016 08:22 PM    pH (UA) 5.5 04/22/2016 08:22 PM    Protein NEGATIVE  04/22/2016 08:22 PM    Glucose NEGATIVE  04/22/2016 08:22 PM    Ketone TRACE 04/22/2016 08:22 PM    Urobilinogen 1.0 04/22/2016 08:22 PM    Nitrites NEGATIVE  04/22/2016 08:22 PM    Leukocyte  Esterase NEGATIVE  04/22/2016 08:22 PM    Epithelial cells FEW 04/22/2016 08:22 PM    Bacteria NEGATIVE  04/22/2016 08:22 PM    WBC 0-4 04/22/2016 08:22 PM    RBC 0-5 04/22/2016 08:22 PM                         Ivor Reining Delman Kitten, MD

## 2016-04-27 LAB — PROTHROMBIN TIME + INR
INR: 1.1 (ref 0.9–1.1)
Prothrombin time: 11.6 s — ABNORMAL HIGH (ref 9.0–11.1)

## 2016-04-27 LAB — METABOLIC PANEL, BASIC
Anion gap: 10 mmol/L (ref 5–15)
BUN/Creatinine ratio: 13 (ref 12–20)
BUN: 5 MG/DL — ABNORMAL LOW (ref 6–20)
CO2: 21 mmol/L (ref 21–32)
Calcium: 9.3 MG/DL (ref 8.5–10.1)
Chloride: 100 mmol/L (ref 97–108)
Creatinine: 0.38 MG/DL — ABNORMAL LOW (ref 0.70–1.30)
GFR est AA: >60 mL/min/{1.73_m2} (ref >60)
GFR est non-AA: >60 mL/min/{1.73_m2} (ref >60)
Glucose: 84 mg/dL (ref 65–100)
Potassium: 3.9 mmol/L (ref 3.5–5.1)
Sodium: 131 mmol/L — ABNORMAL LOW (ref 136–145)

## 2016-04-27 LAB — CBC WITH AUTOMATED DIFF
ABS. BASOPHILS: 0 10*3/uL (ref 0.0–0.1)
ABS. EOSINOPHILS: 0.1 10*3/uL (ref 0.0–0.4)
ABS. LYMPHOCYTES: 2.2 10*3/uL (ref 0.8–3.5)
ABS. MONOCYTES: 1 10*3/uL (ref 0.0–1.0)
ABS. NEUTROPHILS: 3.4 10*3/uL (ref 1.8–8.0)
BASOPHILS: 0 % (ref 0–1)
EOSINOPHILS: 1 % (ref 0–7)
HCT: 34.4 % — ABNORMAL LOW (ref 36.6–50.3)
HGB: 12.4 g/dL (ref 12.1–17.0)
LYMPHOCYTES: 33 % (ref 12–49)
MCH: 30.5 PG (ref 26.0–34.0)
MCHC: 36 g/dL (ref 30.0–36.5)
MCV: 84.5 FL (ref 80.0–99.0)
MONOCYTES: 15 % — ABNORMAL HIGH (ref 5–13)
NEUTROPHILS: 51 % (ref 32–75)
PLATELET: 246 10*3/uL (ref 150–400)
RBC: 4.07 M/uL — ABNORMAL LOW (ref 4.10–5.70)
RDW: 12.9 % (ref 11.5–14.5)
WBC: 6.7 10*3/uL (ref 4.1–11.1)

## 2016-04-27 LAB — MAGNESIUM: Magnesium: 1.2 mg/dL — ABNORMAL LOW (ref 1.6–2.4)

## 2016-04-27 LAB — GLUCOSE, POC: Glucose (POC): 90 mg/dL (ref 65–100)

## 2016-04-27 MED ORDER — ONDANSETRON (PF) 4 MG/2 ML INJECTION
4 mg/2 mL | Freq: Three times a day (TID) | INTRAMUSCULAR | Status: AC
Start: 2016-04-27 — End: 2016-04-28
  Administered 2016-04-27 – 2016-04-28 (×2): via INTRAVENOUS

## 2016-04-27 MED ORDER — PHARMACY WARFARIN NOTE
Status: DC
Start: 2016-04-27 — End: 2016-04-28

## 2016-04-27 MED ORDER — ONDANSETRON (PF) 4 MG/2 ML INJECTION
4 mg/2 mL | Freq: Three times a day (TID) | INTRAMUSCULAR | Status: DC | PRN
Start: 2016-04-27 — End: 2016-05-01
  Administered 2016-04-27: 19:00:00 via INTRAVENOUS

## 2016-04-27 MED ORDER — WARFARIN 7.5 MG TAB
7.5 mg | Freq: Once | ORAL | Status: AC
Start: 2016-04-27 — End: 2016-04-27
  Administered 2016-04-27: 19:00:00 via ORAL

## 2016-04-27 MED ORDER — AZITHROMYCIN 250 MG TAB
250 mg | Freq: Every day | ORAL | Status: AC
Start: 2016-04-27 — End: 2016-04-30
  Administered 2016-04-28 – 2016-04-30 (×3): via ORAL

## 2016-04-27 MED ORDER — MAGNESIUM SULFATE 2 GRAM/50 ML IVPB
2 gram/50 mL (4 %) | Freq: Once | INTRAVENOUS | Status: AC
Start: 2016-04-27 — End: 2016-04-29
  Administered 2016-04-27: 15:00:00 via INTRAVENOUS

## 2016-04-27 MED FILL — CEFTRIAXONE 1 GRAM SOLUTION FOR INJECTION: 1 gram | INTRAMUSCULAR | Qty: 1

## 2016-04-27 MED FILL — PHARMACY WARFARIN NOTE: Qty: 1

## 2016-04-27 MED FILL — COUMADIN 7.5 MG TABLET: 7.5 mg | ORAL | Qty: 1

## 2016-04-27 MED FILL — AZITHROMYCIN 500 MG IV SOLUTION: 500 mg | INTRAVENOUS | Qty: 5

## 2016-04-27 MED FILL — MAGNESIUM SULFATE 2 GRAM/50 ML IVPB: 2 gram/50 mL (4 %) | INTRAVENOUS | Qty: 50

## 2016-04-27 MED FILL — ONDANSETRON (PF) 4 MG/2 ML INJECTION: 4 mg/2 mL | INTRAMUSCULAR | Qty: 2

## 2016-04-27 MED FILL — NORMAL SALINE FLUSH 0.9 % INJECTION SYRINGE: INTRAMUSCULAR | Qty: 10

## 2016-04-27 MED FILL — CHILDREN'S ASPIRIN 81 MG CHEWABLE TABLET: 81 mg | ORAL | Qty: 1

## 2016-04-27 MED FILL — TAMSULOSIN SR 0.4 MG 24 HR CAP: 0.4 mg | ORAL | Qty: 1

## 2016-04-27 MED FILL — LOVENOX 60 MG/0.6 ML SUBCUTANEOUS SYRINGE: 60 mg/0.6 mL | SUBCUTANEOUS | Qty: 0.6

## 2016-04-27 MED FILL — LIPITOR 20 MG TABLET: 20 mg | ORAL | Qty: 1

## 2016-04-27 NOTE — Progress Notes (Signed)
Pharmacist Note ??? Warfarin Dosing  Consult provided for this 64 y.o.male to manage warfarin for left leg Venous Thrombosis    INR Goal: 2 - 3    Drugs that may increase INR: Macrolides  Drugs that may decrease INR: None  Other current anticoagulants/ drugs that may increase bleeding risk: Enoxaparin  Risk factors: None  Daily INR ordered: YES    Recent Labs      04/27/16   1126  04/27/16   0313   HGB   --   12.4   INR  1.1   --        Date               INR                  Dose  9/10  1.1  7.5 mg                                                                                Assessment/ Plan:  Will order warfarin 7.5 mg PO x 1 dose.    Pharmacy will continue to monitor daily and adjust therapy as indicated.  Please contact the pharmacist at  [phone] for outpatient recommendations if needed.

## 2016-04-27 NOTE — Progress Notes (Signed)
Hospitalist Progress Note          Date of Service:  04/27/2016  NAME:  Bruce Clark  DOB:  04/14/1951  MRN:  [id number]      Admission Summary:   "Mr Mckim presented with right sided chest pain,cough productive of mainly whitish sputum,fever.He was admitted for pneumonia and left leg DVT.He is a chronic active smoker,about 1 PPD."    Interval history / Subjective:   No complaints, he is aware of the tentative plan.     Assessment & Plan:   Community acquired pneumonia  -CTA chest was negative for PE,however revealed bilateral lung nodules,mediastinal and hilar LAP,peribronchial dz and ILD  -Continue abx     Possible lung neoplasm.Patient is chronic active smoker  -CT chest finding worrisome.He needs work up for malignancy.Pulmonary consulted.For PET scans,EBUS on hold due to anticoagulation for DVT  -PET scan Monday    9/8 Echo EF 55-60%, suboptimal visualization    9/8 carotid dopplers prelim: less than 50% stenosis and antegrade flow    9/8 Pulmonary note/procedure note:  ?? "--Currently on Lovenox; financial status will impact anticoagulation decision  ?? spirometry   ?? On azithromycin/rocephin  ?? PET on Monday if he stays (order is in and discussed with RN to schedule it), review for secondary site to bx as well.   ?? O2 titration above 90%  ?? Discussed with hospitalist/ attending   PRN over the weekend "  "Spirometry reveals obstructive physiology with possible superimposed   restrictive physiology, versus air trapping. Forced vital capacity is   mildly reduced, FEV1 is moderately reduced, and mid expiratory flow is   severely reduced. The ratio suggests obstructive physiology, as does   flow loop. Clinical correlation is recommended, but the patient can   have further definition of this abnormality with pre- and post   bronchodilators in the lab, lung volumes, and diffusion as clinically   Indicated."     Cigarette smoking: counseled to quit during hospital stay    Left leg DVT: on Lovenox.  -CTA has ruled out PE  -Weighing between Warfarin vs NOACs.He is uninsured,does not have PCP.Case management helping to get [facility] to [facility].  -->04/27/2016 Ordered pharmacy consult for warfarin dosing due to cost of other options and hopes pt can be established with f/u with [facility].    Left hydroureteronephrosis  -CT A/P:Marked heterogeneous enlargement of the prostate gland. Moderate left  hydroureteronephrosis to the level of the mid ureter  -Urology saw and recommended outpatient follow up     Hypomagnesemia, replete as needed    Hyponatremia in context of possible lung neoplasm  trend    BPH: on flomax    Acute stroke with left UE weakness,patienr reported symptoms dated 2-3 weeks  9/8 Echo EF 55-60%, suboptimal visualization9/8 carotid dopplers prelim: less than 50% stenosis and antegrade flow      Body mass index is 19.59 kg/(m^2).        Diet:regular  Code status: full  DVT prophylaxis: lovenox treatment dose for reasons noted above.  Care Plan discussed with: Uninsured.PET scan Monday and discharge afterwards.He would follow at the [facility] over clinic.He needs a one month supply of anticoagulation on discharge.    Hospital Problems  Date Reviewed: 2016-04-23          Codes Class Noted POA    Pneumonia ICD-10-CM: J18.9  ICD-9-CM: 938  04-23-2016 Unknown                Review of  Systems:   A comprehensive review of systems was negative except for that written in the HPI.     Physical Examination:      Last 24hrs VS reviewed since prior progress note. Most recent are:  Visit Vitals   ??? BP 117/84   ??? Pulse 78   ??? Temp 98 ??F (36.7 ??C)   ??? Resp 18   ??? Ht '5\' 6"'$  (1.676 m)   ??? Wt 55.1 kg (121 lb 6 oz)   ??? SpO2 98%   ??? BMI 19.59 kg/m2           Constitutional:  No acute distress, cooperative, pleasant??   Eyes: Non icteric,non pallor,no erythema,discharge   ENT:  Oral mucous moist, oropharynx benign. Neck supple,     Resp:  CTA bilaterally. No wheezing/rhonchi/rales. No accessory muscle use   CV:  Regular rhythm, normal rate, no gallops, rubs    GI:  Soft, non distended, non tender. normoactive bowel sounds    GU:  No CVA or suprapubic tenderness   Skin  :  No rash,bullae,dipigmentation     Musculoskeletal:  warm, 2+ pulses throughout    Neurologic:  AAOx3, CN II-XII reviewed. No slurring of speech.     Psych:  Good insight, Not anxious nor agitated.         Intake/Output Summary (Last 24 hours) at 04/27/16 0959  Last data filed at 04/27/16 0825   Gross per 24 hour   Intake                0 ml   Output             1780 ml   Net            -1780 ml     Labs:     Recent Labs      04/27/16   0313   WBC  6.7   HGB  12.4   HCT  34.4*   PLT  246     Recent Labs      04/27/16   0313   NA  131*   K  3.9   CL  100   CO2  21   BUN  5*   CREA  0.38*   GLU  84   CA  9.3   MG  1.2*         Lab Results   Component Value Date/Time    Cholesterol, total 140 04/23/2016 03:59 AM    HDL Cholesterol 59 04/23/2016 03:59 AM    LDL, calculated 70.4 04/23/2016 03:59 AM    Triglyceride 53 04/23/2016 03:59 AM    CHOL/HDL Ratio 2.4 04/23/2016 03:59 AM     Lab Results   Component Value Date/Time    Glucose (POC) 90 04/26/2016 10:33 PM    Glucose (POC) 71 04/26/2016 05:12 PM    Glucose (POC) 106 04/26/2016 12:03 PM    Glucose (POC) 117 04/25/2016 04:52 PM    Glucose (POC) 148 04/25/2016 11:22 AM     Lab Results   Component Value Date/Time    Color YELLOW/STRAW 04/22/2016 08:22 PM    Appearance CLEAR 04/22/2016 08:22 PM    Specific gravity 1.025 04/22/2016 08:22 PM    pH (UA) 5.5 04/22/2016 08:22 PM    Protein NEGATIVE  04/22/2016 08:22 PM    Glucose NEGATIVE  04/22/2016 08:22 PM    Ketone TRACE 04/22/2016 08:22 PM    Urobilinogen 1.0 04/22/2016 08:22 PM    Nitrites  NEGATIVE  04/22/2016 08:22 PM    Leukocyte Esterase NEGATIVE  04/22/2016 08:22 PM    Epithelial cells FEW 04/22/2016 08:22 PM    Bacteria NEGATIVE  04/22/2016 08:22 PM     WBC 0-4 04/22/2016 08:22 PM    RBC 0-5 04/22/2016 08:22 PM                        Ivor Reining Delman Kitten, MD

## 2016-04-27 NOTE — Progress Notes (Signed)
Clinical Pharmacy Note: Re: IV to PO Automatic Conversion - Antibiotic    Please note: Bruce Clark???s medication azithromycin has been changed from IV to PO based on the following criteria:    The patient:  1.   Has received IV therapy for at least 48 hours   2.   Has a functioning GI tract  - Taking scheduled oral medications  - Tolerating tube feeds at goal rate or a full liquid, soft, or regular diet         3. Is clinically stable        - Temperature < 100.42F for at least 24 hours        - WBC is trending down    This IV to PO conversion is based on the [employer] approved automatic conversion policy for eligible patients.  Please call with questions.

## 2016-04-27 NOTE — Progress Notes (Signed)
Problem: Falls - Risk of  Goal: *Absence of Falls  Document Schmid Fall Risk and appropriate interventions in the flowsheet.   Outcome: Progressing Towards Goal  Fall Risk Interventions:

## 2016-04-28 ENCOUNTER — Inpatient Hospital Stay: Admit: 2016-04-28 | Payer: Charity | Attending: Medical | Primary: Internal Medicine

## 2016-04-28 LAB — METABOLIC PANEL, BASIC
Anion gap: 13 mmol/L (ref 5–15)
BUN/Creatinine ratio: 11 — ABNORMAL LOW (ref 12–20)
BUN: 4 MG/DL — ABNORMAL LOW (ref 6–20)
CO2: 19 mmol/L — ABNORMAL LOW (ref 21–32)
Calcium: 9.4 MG/DL (ref 8.5–10.1)
Chloride: 96 mmol/L — ABNORMAL LOW (ref 97–108)
Creatinine: 0.35 MG/DL — ABNORMAL LOW (ref 0.70–1.30)
GFR est AA: >60 mL/min/{1.73_m2} (ref >60)
GFR est non-AA: >60 mL/min/{1.73_m2} (ref >60)
Glucose: 86 mg/dL (ref 65–100)
Potassium: 4 mmol/L (ref 3.5–5.1)
Sodium: 128 mmol/L — ABNORMAL LOW (ref 136–145)

## 2016-04-28 LAB — MAGNESIUM: Magnesium: 1.3 mg/dL — ABNORMAL LOW (ref 1.6–2.4)

## 2016-04-28 LAB — GLUCOSE, POC: Glucose (POC): 103 mg/dL — ABNORMAL HIGH (ref 65–100)

## 2016-04-28 LAB — PROTHROMBIN TIME + INR
INR: 1.1 (ref 0.9–1.1)
Prothrombin time: 11.4 s — ABNORMAL HIGH (ref 9.0–11.1)

## 2016-04-28 MED ORDER — APIXABAN 5 MG TABLET
5 mg | ORAL_TABLET | Freq: Two times a day (BID) | ORAL | 0 refills | Status: AC
Start: 2016-04-28 — End: 2016-05-05

## 2016-04-28 MED ORDER — SODIUM CHLORIDE 0.9 % IV
4 mEq/mL (50 %) | Freq: Once | INTRAVENOUS | Status: AC
Start: 2016-04-28 — End: 2016-04-29
  Administered 2016-04-28: 20:00:00 via INTRAVENOUS

## 2016-04-28 MED ORDER — APIXABAN 5 MG TABLET
5 mg | Freq: Two times a day (BID) | ORAL | Status: DC
Start: 2016-04-28 — End: 2016-04-29
  Administered 2016-04-29: 01:00:00 via ORAL

## 2016-04-28 MED ORDER — APIXABAN 5 MG TABLET
5 mg | ORAL_TABLET | Freq: Two times a day (BID) | ORAL | 0 refills | Status: DC
Start: 2016-04-28 — End: 2016-05-07

## 2016-04-28 MED ORDER — SODIUM CHLORIDE 0.9 % IJ SYRG
Freq: Once | INTRAMUSCULAR | Status: AC
Start: 2016-04-28 — End: 2016-04-28
  Administered 2016-04-28: 15:00:00 via INTRAVENOUS

## 2016-04-28 MED ORDER — TAMSULOSIN SR 0.4 MG 24 HR CAP
0.4 mg | ORAL_CAPSULE | Freq: Every day | ORAL | 0 refills | Status: DC
Start: 2016-04-28 — End: 2016-05-01

## 2016-04-28 MED ORDER — WARFARIN 7.5 MG TAB
7.5 mg | Freq: Once | ORAL | Status: DC
Start: 2016-04-28 — End: 2016-04-28

## 2016-04-28 MED ORDER — F-18 FLUORODEOXYGLUCOSE
Freq: Once | Status: AC
Start: 2016-04-28 — End: 2016-04-28
  Administered 2016-04-28: 15:00:00 via INTRAVENOUS

## 2016-04-28 MED FILL — MAGNESIUM SULFATE 50 % (4 MEQ/ML) INJECTION: 4 mEq/mL (50 %) | INTRAMUSCULAR | Qty: 6

## 2016-04-28 MED FILL — CEFTRIAXONE 1 GRAM SOLUTION FOR INJECTION: 1 gram | INTRAMUSCULAR | Qty: 1

## 2016-04-28 MED FILL — CHILDREN'S ASPIRIN 81 MG CHEWABLE TABLET: 81 mg | ORAL | Qty: 1

## 2016-04-28 MED FILL — ONDANSETRON (PF) 4 MG/2 ML INJECTION: 4 mg/2 mL | INTRAMUSCULAR | Qty: 2

## 2016-04-28 MED FILL — LOVENOX 60 MG/0.6 ML SUBCUTANEOUS SYRINGE: 60 mg/0.6 mL | SUBCUTANEOUS | Qty: 0.6

## 2016-04-28 MED FILL — TYLENOL 325 MG TABLET: 325 mg | ORAL | Qty: 2

## 2016-04-28 MED FILL — AZITHROMYCIN 250 MG TAB: 250 mg | ORAL | Qty: 2

## 2016-04-28 MED FILL — LIPITOR 20 MG TABLET: 20 mg | ORAL | Qty: 1

## 2016-04-28 MED FILL — NORMAL SALINE FLUSH 0.9 % INJECTION SYRINGE: INTRAMUSCULAR | Qty: 10

## 2016-04-28 MED FILL — TAMSULOSIN SR 0.4 MG 24 HR CAP: 0.4 mg | ORAL | Qty: 1

## 2016-04-28 NOTE — Procedures (Signed)
[facility]  *** FINAL REPORT ***    Name: [name], [name]  MRN: [id number]    Inpatient  DOB: Dec 25, 1950  HIS Order #: [id number]  Pace Visit #: [id number]  Date: 25 Apr 2016    TYPE OF TEST: Cerebrovascular Duplex    REASON FOR TEST  Cerebrovascular accident    Right Carotid:-             Proximal               Mid                 Distal  cm/s  Systolic  Diastolic  Systolic  Diastolic  Systolic  Diastolic  CCA:     16.9      14.0                            95.0      17.0  Bulb:  ECA:     82.0       8.0  ICA:     59.0      16.0                            83.0      23.0  ICA/CCA:  0.6       0.9    ICA Stenosis:    Right Vertebral:-  Finding: Antegrade  Sys:       36.0  Dia:       12.0    Right Subclavian:    Left Carotid:-            Proximal                Mid                 Distal  cm/s  Systolic  Diastolic  Systolic  Diastolic  Systolic  Diastolic  CCA:     67.8       9.0                            77.0       9.0  Bulb:  ECA:     70.0      10.0  ICA:     64.0      15.0                            56.0      14.0  ICA/CCA:  0.8       1.7    ICA Stenosis:    Left Vertebral:-  Finding: Antegrade  Sys:       47.0  Dia:       13.0    Left Subclavian:    INTERPRETATION/FINDINGS  PROCEDURE:  Color duplex ultrasound imaging of extracranial  cerebrovascular arteries.    FINDINGS:       Right:   Internal carotid velocity is within normal limits.  There is narrowing of the internal carotid flow channel on color  Doppler imaging and  non-calcific plaque on B-mode imaging, consistent   with less than 50 percent stenosis (lower portion of the 0 to 49  percent range).  The common and external carotid arteries are patent  and without evidence of hemodynamically significant stenosis.  Left: Internal carotid velocity is within normal limits.  There  is narrowing of the internal carotid flow channel on color Doppler  imaging and  non-calcific plaque on B-mode imaging, consistent with  less than 50 percent stenosis  (lower portion of the 0 to 49 percent  range).  The common and external carotid arteries are patent and  without evidence of hemodynamically significant stenosis.    IMPRESSION:  Consistent with less than 50% stenosis of the right  internal carotid and less than 50% stenosis of the left internal  carotid.  Vertebrals are patent with antegrade flow.    ADDITIONAL COMMENTS    I have personally reviewed the data relevant to the interpretation of  this  study.    TECHNOLOGIST: Domingo Cocking. Lanae Crumbly, RVS  Signed: 04/25/2016 09:30 AM    PHYSICIAN: Luiz Ochoa, MD  Signed: 04/28/2016 08:59 AM

## 2016-04-28 NOTE — Consults (Signed)
Consults by Alejandro Mulling, DO at 04/28/16 1544                Author: Alric Quan, Linden Dolin, DO  Service: ONCOLOGY  Author Type: Physician       Filed: 04/28/16 1755  Date of Service: 04/28/16 1544  Status: Addendum          Editor: Alric Quan, Linden Dolin, DO (Physician)          Related Notes: Original Note by Jearld Pies, NP (Nurse Practitioner) filed at 04/28/16  1706            Consult Orders        1. IP CONSULT TO ONCOLOGY [[id number]] ordered by Minor, Amado Coe, NP at 04/28/16 1447                                      Hematology/Oncology Consult      REASON FOR CONSULT: Metastatic Disease on PET   REQUESTED BY: Beverlee Nims Minor, NP      HISTORY OF PRESENT ILLNESS: Mr. Craine  is a 65 y.o. male who presented  to the Emergency Department on 04/22/16 with nausea, vomiting, and abdominal pain. He states that the nausea and vomiting began about three months ago. His appetite has been down for about this same amount of time and he has not tolerated food intake. He  tolerates oral intake okay per his report, mostly drinks juices. He endorses early satiety. He has lost about 12-14 pounds in the past few months unintentionally. The pain in his abdomen is in his left lower quadrant and intermittent in nature. It is  controlled today. He denies changes to his bowel habits. He states he has had some constipation and diarrhea over the past few months but that it was infrequent. He denies any headaches, dizziness, or recent falls. He denies shortness of breath and dyspnea  with exertion. He states he has had a productive cough, no hemoptysis. Has had two episodes of nosebleeds about a week ago but these both stopped with pressure. Denies other bleeding to include hematuria and melena. He is currently on Eliquis for right  leg DVT. CTA revealed bilateral lung nodules and mediastinal/hilar adenopathy. Pulmonary is on board and planning for EBUS. PET obtained today revealed supraclavicular  adenopathy, retroperitoneal adenopathy, bilateral iliac adenopathy, mediastinal/hilar  adenopathy, bony metastatic disease, right lower lobe pulmonary mass, and multiple other smaller masses in bilateral lungs for which we are consulted. History significant for hernia repair.      He denies personal cancer history. Significant cigarette smoking history as he started at age 39 and quit 3-4 weeks ago per patient. He used to drink alcohol but stopped about 6 or 7 months ago due to taste changes. He smoked marijuana in his 20's but  none since that time. Denies illicit drug use. Lives in [place].      History reviewed. No pertinent past medical history.        Past Surgical History:         Procedure  Laterality  Date          ?  HX HERNIA REPAIR               No Known Allergies        Current Facility-Administered Medications             Medication  Dose  Route  Frequency  Provider  Last Rate  Last Dose              ?  apixaban (ELIQUIS) tablet 10 mg   10 mg  Oral  Q12H  Diana Minor V, NP           ?  magnesium sulfate 3 g in 0.9% sodium chloride 100 mL IVPB   3 g  IntraVENous  ONCE  Diana Minor V, NP  33.3 mL/hr at 04/28/16 1624  3 g at 04/28/16 1624     ?  ondansetron (ZOFRAN) injection 4 mg   4 mg  IntraVENous  Q8H PRN  Schuyler Amor, MD     4 mg at 04/27/16 1447     ?  azithromycin (ZITHROMAX) tablet 500 mg   500 mg  Oral  DAILY  Schuyler Amor, MD     500 mg at 04/28/16 0916     ?  tamsulosin (FLOMAX) capsule 0.4 mg   0.4 mg  Oral  DAILY  Schuyler Amor, MD     0.4 mg at 04/28/16 0916     ?  atorvastatin (LIPITOR) tablet 20 mg   20 mg  Oral  DAILY  Schuyler Amor, MD     20 mg at 04/28/16 0915     ?  sodium chloride (NS) flush 5-10 mL   5-10 mL  IntraVENous  Q8H  Toy Baker, MD     10 mL at 04/28/16 1618     ?  sodium chloride (NS) flush 5-10 mL   5-10 mL  IntraVENous  PRN  Toy Baker, MD           ?  cefTRIAXone (ROCEPHIN) 1 g in 0.9% sodium chloride (MBP/ADV) 50 mL   1 g  IntraVENous  Q24H  Toy Baker, MD   100 mL/hr at 04/27/16 2249  1 g at 04/27/16 2249     ?  acetaminophen (TYLENOL) tablet 650 mg   650 mg  Oral  Q4H PRN  Toy Baker, MD     650 mg at 04/27/16 2156     ?  aspirin chewable tablet 81 mg   81 mg  Oral  DAILY  Toy Baker, MD     81 mg at 04/28/16 0915              ?  bisacodyl (DULCOLAX) tablet 5 mg   5 mg  Oral  DAILY PRN  Toy Baker, MD                   Social History          Social History         ?  Marital status:  SINGLE              Spouse name:  N/A         ?  Number of children:  N/A         ?  Years of education:  N/A          Social History Main Topics         ?  Smoking status:  None     ?  Smokeless tobacco:  None     ?  Alcohol use  None     ?  Drug use:  None         ?  Sexual activity:  Not Asked  Other Topics  Concern        ?  None          Social History Narrative           History reviewed. No pertinent family history.      ROS   As per the HPI, otherwise a comprehensive ROS is negative.   ECOG PS is 1   Emotional well being addressed and patient is coping well .      Physical Examination:      Visit Vitals         ?  BP  116/69     ?  Pulse  77     ?  Temp  98.2 ??F (36.8 ??C)     ?  Resp  16     ?  Ht  5' 6" (1.676 m)     ?  Wt  121 lb 6 oz (55.1 kg)     ?  SpO2  93%         ?  BMI  19.59 kg/m2        General appearance - alert, pleasant, conversant, no distress   Mental status - oriented to person, place, and time   Mouth - mucous membranes moist   Neck - supple, adenopathy as below   Lymphatics - palpable cervical adenopathy, no palpable supraclavicular adenopathy   Chest - diminished bases bilaterally, no wheezes, rales or rhonchi, symmetric air entry   Heart - normal rate, regular rhythm, normal S1, S2, no murmurs, rubs, clicks or gallops   Abdomen - soft, nontender, nondistended, no masses or organomegaly, bowel sounds present   Neurological - normal speech, unable to assess gait, no focal findings or movement disorder noted   Musculoskeletal - no joint  tenderness, deformity or swelling   Extremities - peripheral pulses normal, mild RLE edema   Skin - warm, dry, intact      LABS     Lab Results         Component  Value  Date/Time            WBC  6.7  04/27/2016 03:13 AM       HGB  12.4  04/27/2016 03:13 AM       HCT  34.4  04/27/2016 03:13 AM       PLATELET  246  04/27/2016 03:13 AM       MCV  84.5  04/27/2016 03:13 AM            ABS. NEUTROPHILS  3.4  04/27/2016 03:13 AM          Lab Results         Component  Value  Date/Time            Sodium  128  04/28/2016 01:46 PM       Potassium  4.0  04/28/2016 01:46 PM       Chloride  96  04/28/2016 01:46 PM       CO2  19  04/28/2016 01:46 PM       Glucose  86  04/28/2016 01:46 PM       BUN  4  04/28/2016 01:46 PM       Creatinine  0.35  04/28/2016 01:46 PM       GFR est AA  >60  04/28/2016 01:46 PM       GFR est non-AA  >60  04/28/2016 01:46 PM  Calcium  9.4  04/28/2016 01:46 PM          Lab Results         Component  Value  Date/Time            AST (SGOT)  58  04/24/2016 04:19 AM       Alk. phosphatase  99  04/24/2016 04:19 AM       Protein, total  6.6  04/24/2016 04:19 AM       Albumin  2.9  04/24/2016 04:19 AM       Globulin  3.7  04/24/2016 04:19 AM            A-G Ratio  0.8  04/24/2016 04:19 AM           IMAGING   PET 04/28/2016   IMPRESSION:    1. There is supraclavicular adenopathy left greater than right with increased   metabolic activity. There is retroperitoneal and bilateral iliac adenopathy with   increased metabolic activity and findings are suspicious for metastatic disease.   There is also mediastinal and hilar adenopathy which demonstrates only slight   increased metabolic activity could be metastatic or possibly reactive.   2. There is left hydroureteronephrosis with a left ureter dilated to mid pelvis   suspicious for obstruction.   3. There is bony metastatic disease. There is soft tissue in the paravertebral   region adjacent to C7 suspicious for soft tissue extension.    4. Prostate gland  is enlarged and there is increased block activity in the   prostate gland anteriorly which could be related to primary neoplasm or possibly   prostatitis   5. There is a right lower lobe pulmonary mass with increased metabolic activity   of 4.2 which could represent a primary or metastatic lesion.. There are multiple   other masses which are smaller which are demonstrating SUV of 2 or less may   represent metastatic lesions but less metabolic activity related to small size.   There is interstitial prominence and peribronchial thickening nonspecific but   could represent lymphangitic spread.      MRI Brain 04/23/16 - Small acute lacunar infarct in the superior posterior right frontal lobe.      CTA 04/23/16   IMPRESSION:    1. No pulmonary embolus.   2. Bilateral lung nodules.   3. Mediastinal and bilateral hilar adenopathy.   4. Peribronchial disease and interstitial lung disease.   5. Atherosclerotic aorta with coronary artery calcification.   6. Persistent left nephrogram with hydronephrosis.      ASSESSMENT   Mr. Soulliere is a  65 y.o. male who we are asked to see in consultation for newly found widespread metastatic  disease on PET today.      DISCUSSION/PLAN   1. Abnormal CTs with possible dominant lung mass/ could be metastatic disease of unknown primary.   CTA 04/23/16 revealed lung mass and adenopathy for which Pulmonary had planned EBUS.   Patient is on Eliquis for DVT so EBUS was on hold.    PET today revealed widespread mets to include bony metastatic disease, pulmonary nodules, and right lower lobe mass.   Reviewed scans today with patient and concern for malignancy.   Need tissue dx.     Discussed we need to move forward with a biopsy to determine site of origin.   Suspicious for lung primary given large RLL mass and smoking history.   Recommend CT guided bx of lymph node   This  can be done either inpatient or outpatient pending primary team dispo.      2. DVT. He is on Eliquis per primary team.      Pt seen  today in conjunction with NP A Sandridge   Appreciate the opportunity to be involved in Mr. Ismael's care. Please call with any questions.      Alejandro Mulling, DO

## 2016-04-28 NOTE — Telephone Encounter (Signed)
Consult     Edvin Albus 1950-11-08    Bone Mets?    NP [facility] Minor    Room 532

## 2016-04-28 NOTE — Progress Notes (Signed)
Pulmonary, Critical Care, and Sleep Medicine~Progress Note    Name: Bruce Clark MRN: [id number]   DOB: May 24, 1951 Hospital: [facility]   Date: 04/28/2016 10:38 AM Admission: 04/22/2016     IMPRESSION:   ?? Abnormal CT scan; 9/6 (no prior films for review); 2.7 in RLL, 2.1cm x 2 in LLL, diffuse peribronchial thickening and interstitial disease bilaterally, bilateral hilar/mediastinal lymph nodes largest 3.3 cm in right hilar  ?? COPD, FEV1 1.4L @ 54%  ?? Former smoker   ?? Hx of hernia repair   ?? Right DVT      PLAN:   ?? Warfarin   ?? spirometry   ?? On azithromycin/rocephin; consider narrowing soon   ?? PET today at 3pm  ?? O2 titration above 90%      Daily Progression:    No acute dyspnea or chest pains     OBJECTIVE:     Vital Signs:       Visit Vitals   ??? BP 129/76   ??? Pulse 70   ??? Temp 98 ??F (36.7 ??C)   ??? Resp 14   ??? Ht '5\' 6"'$  (1.676 m)   ??? Wt 55.1 kg (121 lb 6 oz)   ??? SpO2 98%   ??? BMI 19.59 kg/m2      Temp (24hrs), Avg:98.2 ??F (36.8 ??C), Min:97.8 ??F (36.6 ??C), Max:98.6 ??F (37 ??C)     Intake/Output:     Last shift:      Last 3 shifts: 09/09 1901 - 09/11 0700  In: -   Out: 1580 [Urine:1580]        No intake or output data in the 24 hours ending 04/28/16 1003    Physical Exam:                                        Exam Findings Other   General: No resp distress noted, appears stated age    HEENT:  No ulcers, JVD not elevated, no cervical LAD    Chest: No pectus deformity, normal chest rise b/l    HEART:  RRR, no murmurs/rubs/gallops    Lungs:  CTA b/l, no rhonchi/crackles/wheeze, diminished BS at bases    ABD: Soft/NT, non rigid mildly distended    EXT: No cyanosis/clubbing/edema, normal peripheral pulses    Skin: No rashes or ulcers, no mottling    Neuro: A/O x 3        Medications:  Current Facility-Administered Medications   Medication Dose Route Frequency   ??? warfarin (COUMADIN) tablet 7.5 mg  7.5 mg Oral ONCE    ??? Warfarin - Pharmacy dosing   Other Rx Dosing/Monitoring   ??? ondansetron (ZOFRAN) injection 4 mg  4 mg IntraVENous Q8H PRN   ??? azithromycin (ZITHROMAX) tablet 500 mg  500 mg Oral DAILY   ??? tamsulosin (FLOMAX) capsule 0.4 mg  0.4 mg Oral DAILY   ??? atorvastatin (LIPITOR) tablet 20 mg  20 mg Oral DAILY   ??? sodium chloride (NS) flush 5-10 mL  5-10 mL IntraVENous Q8H   ??? sodium chloride (NS) flush 5-10 mL  5-10 mL IntraVENous PRN   ??? cefTRIAXone (ROCEPHIN) 1 g in 0.9% sodium chloride (MBP/ADV) 50 mL  1 g IntraVENous Q24H   ??? acetaminophen (TYLENOL) tablet 650 mg  650 mg Oral Q4H PRN   ??? aspirin chewable tablet 81 mg  81 mg Oral DAILY   ??? 0.9%  sodium chloride infusion  75 mL/hr IntraVENous CONTINUOUS   ??? bisacodyl (DULCOLAX) tablet 5 mg  5 mg Oral DAILY PRN   ??? enoxaparin (LOVENOX) injection 50 mg  50 mg SubCUTAneous Q12H       Labs:  ABG No results for input(s): PHI, PCO2I, PO2I, HCO3I, SO2I, FIO2I in the last 72 hours.     CBC Recent Labs      04/27/16   0313   WBC  6.7   HGB  12.4   HCT  34.4*   PLT  246   MCV  84.5   MCH  21.3        Metabolic  Panel Recent Labs      04/28/16   0251  04/27/16   1126  04/27/16   0313   NA   --    --   131*   K   --    --   3.9   CL   --    --   100   CO2   --    --   21   GLU   --    --   84   BUN   --    --   5*   CREA   --    --   0.38*   CA   --    --   9.3   MG   --    --   1.2*   INR  1.1  1.1   --         Pertinent Labs                Marlis Edelson, PA  04/28/2016

## 2016-04-28 NOTE — Progress Notes (Addendum)
Cm briefly spoke with MD this morning regarding patient and discharge plans. Cm informed him of the concern of patient discharging on Lovenox and Coumadin, as he is uninsured and has no PCP. Cm will be assisting patient with follow up care at [facility], but they would not be able to see him every few days for lab draws. This cannot be done by home health either. It would be more beneficial for patient to be discharged with Eliquis. Cm will wait for further direction from MD and NP before making a follow up appointment.   Caitlin Helton BSW, ACM    1:45 pm: Cm has made an appointment with [facility] location for Monday, September 18th at 1:30 pm. Follow up information/instructions have been placed in the AVS and cm will inform the patient.    3:00 pmCm faxed patients' prescriptions to the outpatient pharmacy and provided them with the Eliquis prescription savings program. Due to the fact that there were two Eliquis prescriptions, cm only able to utilize the prescription savings program for one. The other medication plus the antibiotic, equaled $182.09. Permission received to have it filled.   [employer], [place]

## 2016-04-28 NOTE — Progress Notes (Signed)
Bedside and Verbal shift change report given to Genice Rouge RN (oncoming nurse) by Synetta Fail (offgoing nurse). Report included the following information SBAR, Kardex, OR Summary, Intake/Output and MAR.

## 2016-04-28 NOTE — Procedures (Signed)
[facility]  *** FINAL REPORT ***    Name: Bruce Clark, Bruce Clark  MRN: [id number]    Inpatient  DOB: 1951-04-09  HIS Order #: [id number]  [place] Visit #: [id number]  Date: 25 Apr 2016    TYPE OF TEST: Cerebrovascular Duplex    REASON FOR TEST  Cerebrovascular accident    Right Carotid:-             Proximal               Mid                 Distal  cm/s  Systolic  Diastolic  Systolic  Diastolic  Systolic  Diastolic  CCA:     51.7      14.0                            95.0      17.0  Bulb:  ECA:     82.0       8.0  ICA:     59.0      16.0                            83.0      23.0  ICA/CCA:  0.6       0.9    ICA Stenosis:    Right Vertebral:-  Finding: Antegrade  Sys:       36.0  Dia:       12.0    Right Subclavian:    Left Carotid:-            Proximal                Mid                 Distal  cm/s  Systolic  Diastolic  Systolic  Diastolic  Systolic  Diastolic  CCA:     61.6       9.0                            77.0       9.0  Bulb:  ECA:     70.0      10.0  ICA:     64.0      15.0                            56.0      14.0  ICA/CCA:  0.8       1.7    ICA Stenosis:    Left Vertebral:-  Finding: Antegrade  Sys:       47.0  Dia:       13.0    Left Subclavian:    INTERPRETATION/FINDINGS  PROCEDURE:  Color duplex ultrasound imaging of extracranial  cerebrovascular arteries.    FINDINGS:       Right:   Internal carotid velocity is within normal limits.  There is narrowing of the internal carotid flow channel on color  Doppler imaging and  non-calcific plaque on B-mode imaging, consistent   with less than 50 percent stenosis (lower portion of the 0 to 49  percent range).  The common and external carotid arteries are patent  and without evidence of hemodynamically significant stenosis.  Left: Internal carotid velocity is within normal limits.  There  is narrowing of the internal carotid flow channel on color Doppler  imaging and  non-calcific plaque on B-mode imaging, consistent with   less than 50 percent stenosis (lower portion of the 0 to 49 percent  range).  The common and external carotid arteries are patent and  without evidence of hemodynamically significant stenosis.    IMPRESSION:  Consistent with less than 50% stenosis of the right  internal carotid and less than 50% stenosis of the left internal  carotid.  Vertebrals are patent with antegrade flow.    ADDITIONAL COMMENTS    I have personally reviewed the data relevant to the interpretation of  this  study.    TECHNOLOGIST: Domingo Cocking. Lanae Crumbly, RVS  Signed: 04/25/2016 09:30 AM    PHYSICIAN: Luiz Ochoa, MD  Signed: 04/28/2016 08:59 AM

## 2016-04-28 NOTE — Progress Notes (Signed)
Full note to follow    Pt off floor for PET scan  Discussed with CM  Can d/c patient on Eliquis with crossover appointment in place for one week post discharge  Will also need to f/u with pulmonary associates for multiple nodules.  Follow bmp from this am  Has received 5 days of abx, can likely d/c with 2 days of Augmentin, need to examine patient first.  Will return.    Garnet Koyanagi, NP

## 2016-04-28 NOTE — Consults (Addendum)
Hematology/Oncology Consult    REASON FOR CONSULT: Metastatic Disease on PET  REQUESTED BY: Beverlee Nims Minor, NP    HISTORY OF PRESENT ILLNESS: [name] is a 65 y.o. male who presented to the Emergency Department on 04/22/16 with nausea, vomiting, and abdominal pain. He states that the nausea and vomiting began about three months ago. His appetite has been down for about this same amount of time and he has not tolerated food intake. He tolerates oral intake okay per his report, mostly drinks juices. He endorses early satiety. He has lost about 12-14 pounds in the past few months unintentionally. The pain in his abdomen is in his left lower quadrant and intermittent in nature. It is controlled today. He denies changes to his bowel habits. He states he has had some constipation and diarrhea over the past few months but that it was infrequent. He denies any headaches, dizziness, or recent falls. He denies shortness of breath and dyspnea with exertion. He states he has had a productive cough, no hemoptysis. Has had two episodes of nosebleeds about a week ago but these both stopped with pressure. Denies other bleeding to include hematuria and melena. He is currently on Eliquis for right leg DVT. CTA revealed bilateral lung nodules and mediastinal/hilar adenopathy. Pulmonary is on board and planning for EBUS. PET obtained today revealed supraclavicular adenopathy, retroperitoneal adenopathy, bilateral iliac adenopathy, mediastinal/hilar adenopathy, bony metastatic disease, right lower lobe pulmonary mass, and multiple other smaller masses in bilateral lungs for which we are consulted. History significant for hernia repair.    He denies personal cancer history. Significant cigarette smoking history as he started at age 64 and quit 3-4 weeks ago per patient. He used to drink alcohol but stopped about 6 or 7 months ago due to taste changes. He smoked marijuana in his 20's but none since that time. Denies illicit drug  use. Lives in [place].    History reviewed. No pertinent past medical history.    Past Surgical History:   Procedure Laterality Date   ??? HX HERNIA REPAIR         No Known Allergies    Current Facility-Administered Medications   Medication Dose Route Frequency Provider Last Rate Last Dose   ??? apixaban (ELIQUIS) tablet 10 mg  10 mg Oral Q12H Diana Minor V, NP       ??? magnesium sulfate 3 g in 0.9% sodium chloride 100 mL IVPB  3 g IntraVENous ONCE Diana Minor V, NP 33.3 mL/hr at 04/28/16 1624 3 g at 04/28/16 1624   ??? ondansetron (ZOFRAN) injection 4 mg  4 mg IntraVENous Q8H PRN Schuyler Amor, MD   4 mg at 04/27/16 1447   ??? azithromycin (ZITHROMAX) tablet 500 mg  500 mg Oral DAILY Schuyler Amor, MD   500 mg at 04/28/16 0916   ??? tamsulosin (FLOMAX) capsule 0.4 mg  0.4 mg Oral DAILY Schuyler Amor, MD   0.4 mg at 04/28/16 0916   ??? atorvastatin (LIPITOR) tablet 20 mg  20 mg Oral DAILY Schuyler Amor, MD   20 mg at 04/28/16 0915   ??? sodium chloride (NS) flush 5-10 mL  5-10 mL IntraVENous Q8H Toy Baker, MD   10 mL at 04/28/16 1618   ??? sodium chloride (NS) flush 5-10 mL  5-10 mL IntraVENous PRN Toy Baker, MD       ??? cefTRIAXone (ROCEPHIN) 1 g in 0.9% sodium chloride (MBP/ADV) 50 mL  1 g IntraVENous Q24H Toy Baker, MD 100 mL/hr  at 04/27/16 2249 1 g at 04/27/16 2249   ??? acetaminophen (TYLENOL) tablet 650 mg  650 mg Oral Q4H PRN Toy Baker, MD   650 mg at 04/27/16 2156   ??? aspirin chewable tablet 81 mg  81 mg Oral DAILY Toy Baker, MD   81 mg at 04/28/16 0915   ??? bisacodyl (DULCOLAX) tablet 5 mg  5 mg Oral DAILY PRN Toy Baker, MD           Social History     Social History   ??? Marital status: SINGLE     Spouse name: N/A   ??? Number of children: N/A   ??? Years of education: N/A     Social History Main Topics   ??? Smoking status: None   ??? Smokeless tobacco: None   ??? Alcohol use None   ??? Drug use: None   ??? Sexual activity: Not Asked     Other Topics Concern   ??? None     Social History Narrative        History reviewed. No pertinent family history.    ROS  As per the HPI, otherwise a comprehensive ROS is negative.  ECOG PS is 1  Emotional well being addressed and patient is coping well.    Physical Examination:   Visit Vitals   ??? BP 116/69   ??? Pulse 77   ??? Temp 98.2 ??F (36.8 ??C)   ??? Resp 16   ??? Ht 5' 6"  (1.676 m)   ??? Wt 121 lb 6 oz (55.1 kg)   ??? SpO2 93%   ??? BMI 19.59 kg/m2     General appearance - alert, pleasant, conversant, no distress  Mental status - oriented to person, place, and time  Mouth - mucous membranes moist  Neck - supple, adenopathy as below  Lymphatics - palpable cervical adenopathy, no palpable supraclavicular adenopathy  Chest - diminished bases bilaterally, no wheezes, rales or rhonchi, symmetric air entry  Heart - normal rate, regular rhythm, normal S1, S2, no murmurs, rubs, clicks or gallops  Abdomen - soft, nontender, nondistended, no masses or organomegaly, bowel sounds present  Neurological - normal speech, unable to assess gait, no focal findings or movement disorder noted  Musculoskeletal - no joint tenderness, deformity or swelling  Extremities - peripheral pulses normal, mild RLE edema  Skin - warm, dry, intact    LABS  Lab Results   Component Value Date/Time    WBC 6.7 04/27/2016 03:13 AM    HGB 12.4 04/27/2016 03:13 AM    HCT 34.4 04/27/2016 03:13 AM    PLATELET 246 04/27/2016 03:13 AM    MCV 84.5 04/27/2016 03:13 AM    ABS. NEUTROPHILS 3.4 04/27/2016 03:13 AM     Lab Results   Component Value Date/Time    Sodium 128 04/28/2016 01:46 PM    Potassium 4.0 04/28/2016 01:46 PM    Chloride 96 04/28/2016 01:46 PM    CO2 19 04/28/2016 01:46 PM    Glucose 86 04/28/2016 01:46 PM    BUN 4 04/28/2016 01:46 PM    Creatinine 0.35 04/28/2016 01:46 PM    GFR est AA >60 04/28/2016 01:46 PM    GFR est non-AA >60 04/28/2016 01:46 PM    Calcium 9.4 04/28/2016 01:46 PM     Lab Results   Component Value Date/Time    AST (SGOT) 58 04/24/2016 04:19 AM    Alk. phosphatase 99 04/24/2016 04:19 AM     Protein, total 6.6 04/24/2016 04:19  AM    Albumin 2.9 04/24/2016 04:19 AM    Globulin 3.7 04/24/2016 04:19 AM    A-G Ratio 0.8 04/24/2016 04:19 AM       IMAGING  PET 04/28/2016  IMPRESSION:   1. There is supraclavicular adenopathy left greater than right with increased  metabolic activity. There is retroperitoneal and bilateral iliac adenopathy with  increased metabolic activity and findings are suspicious for metastatic disease.  There is also mediastinal and hilar adenopathy which demonstrates only slight  increased metabolic activity could be metastatic or possibly reactive.  2. There is left hydroureteronephrosis with a left ureter dilated to mid pelvis  suspicious for obstruction.  3. There is bony metastatic disease. There is soft tissue in the paravertebral  region adjacent to C7 suspicious for soft tissue extension.   4. Prostate gland is enlarged and there is increased block activity in the  prostate gland anteriorly which could be related to primary neoplasm or possibly  prostatitis  5. There is a right lower lobe pulmonary mass with increased metabolic activity  of 4.2 which could represent a primary or metastatic lesion.. There are multiple  other masses which are smaller which are demonstrating SUV of 2 or less may  represent metastatic lesions but less metabolic activity related to small size.  There is interstitial prominence and peribronchial thickening nonspecific but  could represent lymphangitic spread.    MRI Brain 04/23/16 - Small acute lacunar infarct in the superior posterior right frontal lobe.    CTA 04/23/16  IMPRESSION:   1. No pulmonary embolus.  2. Bilateral lung nodules.  3. Mediastinal and bilateral hilar adenopathy.  4. Peribronchial disease and interstitial lung disease.  5. Atherosclerotic aorta with coronary artery calcification.  6. Persistent left nephrogram with hydronephrosis.    ASSESSMENT  Mr. Calleros is a 65 y.o. male who we are asked to see in consultation for  newly found widespread metastatic disease on PET today.    DISCUSSION/PLAN  1. Abnormal CTs with possible dominant lung mass/ could be metastatic disease of unknown primary.  CTA 04/23/16 revealed lung mass and adenopathy for which Pulmonary had planned EBUS.  Patient is on Eliquis for DVT so EBUS was on hold.   PET today revealed widespread mets to include bony metastatic disease, pulmonary nodules, and right lower lobe mass.  Reviewed scans today with patient and concern for malignancy.  Need tissue dx.    Discussed we need to move forward with a biopsy to determine site of origin.  Suspicious for lung primary given large RLL mass and smoking history.  Recommend CT guided bx of lymph node  This can be done either inpatient or outpatient pending primary team dispo.    2. DVT. He is on Eliquis per primary team.    Pt seen today in conjunction with NP A Sandridge  Appreciate the opportunity to be involved in Mr. Kall's care. Please call with any questions.    Alejandro Mulling, DO

## 2016-04-28 NOTE — Progress Notes (Signed)
Pharmacist Note ??? Warfarin Dosing  Consult provided for this 64 y.o.male to manage warfarin for left leg Venous Thrombosis    INR Goal: 2 - 3    Drugs that may increase INR: Macrolides  Drugs that may decrease INR: None  Other current anticoagulants/ drugs that may increase bleeding risk: Enoxaparin  Risk factors: None  Daily INR ordered: YES    Recent Labs      04/28/16   0251  04/27/16   1126  04/27/16   0313   HGB   --    --   12.4   INR  1.1  1.1   --        Date               INR                  Dose  9/10  1.1  7.5 mg   9/11                 1.1                  7.5 mg                                                                               Assessment/ Plan:  Will order warfarin 7.5 mg PO x 1 dose.    Pharmacy will continue to monitor daily and adjust therapy as indicated.  Please contact the pharmacist at  [phone] for outpatient recommendations if needed.

## 2016-04-28 NOTE — Progress Notes (Signed)
Hospitalist Progress Note  Delice Lesch, NP  Office: [phone]  Cell: [phone]      Date of Service:  04/28/2016  NAME:  Bruce Clark  DOB:  09-10-1950  MRN:  [id number]      Admission Summary:   Bruce Clark is a 65 y/o AA male who presented with right sided chest pain,cough productive of mainly whitish sputum, fever on 9/5.  He was admitted with pneumonia and acute right leg DVT    PMHx of hernia repair in 12/2015, BPH, smoker of 1 PPD  Interval history / Subjective:   Pt does offer new complaints.  Denies sob/cp.  Discussed PET results wit patient  Consult oncology   Replete magnesium  Setting up with [facility] and BS will provide Eliquis for DVT    Assessment & Plan:     Community acquired pneumonia  - CTA chest was negative for PE, however revealed bilateral lung nodules, mediastinal and hilar adenopathy , peribronchial dz and ILD  - Continue abx, day 5   ??  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 supraclavicular adenopathy left greater than right with increased metabolic activity. There is retroperitoneal and bilateral iliac adenopathy with increased metabolic activity and findings are suspicious for metastatic disease. 2. There is bony metastatic disease. There is soft tissue in the paravertebral region adjacent to C7 suspicious for soft tissue extension. 3. Prostate gland is enlarged and there is increased block activity in the  prostate gland anteriorly which could be related to primary neoplasm or possibly  Prostatitis. 4. There is a right lower lobe pulmonary mass with increased metabolic activity  of 4.2 which could represent a primary or metastatic lesion..  - consult oncology  - pt reports both parents had cancer, dad had a brain tumor, unsure of what kind of cancer mom had  ??  COPD  - new dx this admission   - keep sat's > 90%  - pulmonary following    Acute Lacunar stroke (POA)  - with left UE weakness,patient reported symptoms dated 2-3 weeks back p  9/8 Echo EF 55-60%, suboptimal visualization  - 9/8 carotid dopplers prelim: less than 50% stenosis and antegrade flow  - continue asa, statin    Acute Right leg DVT  - pt had traveled back in May to [place] when he had a hernia repair.  No hx of familial blood clots or bleeding disorders.  - pt has no pcp and no insurance.  CM has been communicating with [facility].  They are not able to check PTT's so frequently per CM.  Decision made to place pt on Eliquis.  [employer] will pay for first month supply   - CTA ruled out for PE  ??  Left hydroureteronephrosis  - 9/5 CT A/P: marked heterogeneous enlargement of the prostate gland. Moderate left  hydroureteronephrosis to the level of the mid ureter  - Urology saw and recommended outpatient follow up   - 9/11 PET scan: Prostate is enlarged and there is increased metabolic activity within the  prostate gland anteriorly.  There is left hydroureteronephrosis with a dilated left ureter into the superior pelvis where it appears obstructed    Hypomagnesemia  - replete as needed  ??  Hyponatremia   - in the setting of probable lung neoplasm    BPH  - continue flomax  ??  Tobacco dependence  - counseled on smoking cessation    Severe Protein Malnutrition  - Body mass index is 19.59 kg/(m^2)  - nutritional supplements  ??    Code status: Full  DVT prophylaxis: Eliquis  Care Plan discussed with: patient, nurse, Attending MD  Disposition: home tomorrow     Hospital Problems  Date Reviewed: 05/12/16          Codes Class Noted POA    Pneumonia ICD-10-CM: J18.9  ICD-9-CM: 270  12-May-2016 Unknown                Review of Systems:   + 10 lb weight loss in 3 months  Fluctuating appetite x 3 months with a decrease in appetite  Quit smoking 3 weeks ago        Vital Signs:     Last 24hrs VS reviewed since prior progress note. Most recent are:  Visit Vitals   ??? BP 116/69   ??? Pulse 77   ??? Temp 98.2 ??F (36.8 ??C)   ??? Resp 16   ??? Ht '5\' 6"'$  (1.676 m)   ??? Wt 55.1 kg (121 lb 6 oz)   ??? SpO2 93%   ??? BMI 19.59 kg/m2       No intake or output data in the 24 hours ending 04/28/16 1506     Physical Examination:             Constitutional:  No acute distress, cooperative, pleasant??   ENT:  Oral mucous moist, oropharynx benign. Neck supple   Resp:  Diminished bs posteriorly.   No accessory muscle use   CV:  Regular rhythm, normal rate, no murmurs, gallops, rubs    GI:  Soft, non distended, non tender. normoactive bowel sounds, no hepatosplenomegaly     Musculoskeletal:  RLE larger than left, non pitting, not painful or hot.   warm, 2+ pulses throughout    Neurologic:  Moves all extremities.  AAOx3, CN II-XII reviewed     Psych:  Fair insight, Not anxious nor agitated.  Skin:  Good turgor, no rashes or ulcers       Data Review:    Review and/or order of clinical lab test  Review and/or order of tests in the radiology section of CPT  Review and/or order of tests in the medicine section of CPT      Labs:     Recent Labs      04/27/16   0313   WBC  6.7   HGB  12.4   HCT  34.4*   PLT  246     Recent Labs      04/28/16   1346  04/27/16   0313   NA  128*  131*   K  4.0  3.9   CL  96*  100   CO2  19*  21   BUN  4*  5*   CREA  0.35*  0.38*   GLU  86  84   CA  9.4  9.3   MG  1.3*  1.2*     No results for input(s): SGOT, GPT, ALT, AP, TBIL, TBILI, TP, ALB, GLOB, GGT, AML, LPSE in the last 72 hours.    No lab exists for component: AMYP, HLPSE  Recent Labs      04/28/16   0251  04/27/16   1126   INR  1.1  1.1   PTP  11.4*  11.6*      No results for  input(s): FE, TIBC, PSAT, FERR in the last 72 hours.   No results found for: FOL, RBCF   No results for input(s): PH, PCO2, PO2 in the last 72 hours.  No results for input(s): CPK, CKNDX, TROIQ in the last 72 hours.    No lab exists for component: CPKMB  Lab Results    Component Value Date/Time    Cholesterol, total 140 04/23/2016 03:59 AM    HDL Cholesterol 59 04/23/2016 03:59 AM    LDL, calculated 70.4 04/23/2016 03:59 AM    Triglyceride 53 04/23/2016 03:59 AM    CHOL/HDL Ratio 2.4 04/23/2016 03:59 AM     Lab Results   Component Value Date/Time    Glucose (POC) 103 04/28/2016 10:31 AM    Glucose (POC) 90 04/26/2016 10:33 PM    Glucose (POC) 71 04/26/2016 05:12 PM    Glucose (POC) 106 04/26/2016 12:03 PM    Glucose (POC) 117 04/25/2016 04:52 PM     Lab Results   Component Value Date/Time    Color YELLOW/STRAW 04/22/2016 08:22 PM    Appearance CLEAR 04/22/2016 08:22 PM    Specific gravity 1.025 04/22/2016 08:22 PM    pH (UA) 5.5 04/22/2016 08:22 PM    Protein NEGATIVE  04/22/2016 08:22 PM    Glucose NEGATIVE  04/22/2016 08:22 PM    Ketone TRACE 04/22/2016 08:22 PM    Urobilinogen 1.0 04/22/2016 08:22 PM    Nitrites NEGATIVE  04/22/2016 08:22 PM    Leukocyte Esterase NEGATIVE  04/22/2016 08:22 PM    Epithelial cells FEW 04/22/2016 08:22 PM    Bacteria NEGATIVE  04/22/2016 08:22 PM    WBC 0-4 04/22/2016 08:22 PM    RBC 0-5 04/22/2016 08:22 PM         Medications Reviewed:     Current Facility-Administered Medications   Medication Dose Route Frequency   ??? apixaban (ELIQUIS) tablet 10 mg  10 mg Oral Q12H   ??? ondansetron (ZOFRAN) injection 4 mg  4 mg IntraVENous Q8H PRN   ??? azithromycin (ZITHROMAX) tablet 500 mg  500 mg Oral DAILY   ??? tamsulosin (FLOMAX) capsule 0.4 mg  0.4 mg Oral DAILY   ??? atorvastatin (LIPITOR) tablet 20 mg  20 mg Oral DAILY   ??? sodium chloride (NS) flush 5-10 mL  5-10 mL IntraVENous Q8H   ??? sodium chloride (NS) flush 5-10 mL  5-10 mL IntraVENous PRN   ??? cefTRIAXone (ROCEPHIN) 1 g in 0.9% sodium chloride (MBP/ADV) 50 mL  1 g IntraVENous Q24H   ??? acetaminophen (TYLENOL) tablet 650 mg  650 mg Oral Q4H PRN   ??? aspirin chewable tablet 81 mg  81 mg Oral DAILY   ??? 0.9% sodium chloride infusion  75 mL/hr IntraVENous CONTINUOUS    ??? bisacodyl (DULCOLAX) tablet 5 mg  5 mg Oral DAILY PRN     ______________________________________________________________________  EXPECTED LENGTH OF STAY: 3d 14h  ACTUAL LENGTH OF STAY:          6                 Shye Doty V, NP

## 2016-04-28 NOTE — Progress Notes (Signed)
New orders received. Patient discussed with with nurse. Patient was evaluated and discharged by PT 9/6. "Patient demonstrates independence with all mobility and ambulation x 350 feet and 5 stairs without using hand rail". Therapist recommend outpatient PT at discharge as needed for LUE weakness. Please see OT notes. Will be signing off. Nurse agrees. Thanks!    Amie Doreatha Martin PT, DPT

## 2016-04-29 ENCOUNTER — Inpatient Hospital Stay: Payer: Charity | Primary: Internal Medicine

## 2016-04-29 ENCOUNTER — Inpatient Hospital Stay: Admit: 2016-04-29 | Payer: Charity | Primary: Internal Medicine

## 2016-04-29 LAB — METABOLIC PANEL, BASIC
Anion gap: 13 mmol/L (ref 5–15)
BUN/Creatinine ratio: 13 (ref 12–20)
BUN: 4 MG/DL — ABNORMAL LOW (ref 6–20)
CO2: 20 mmol/L — ABNORMAL LOW (ref 21–32)
Calcium: 9.1 MG/DL (ref 8.5–10.1)
Chloride: 97 mmol/L (ref 97–108)
Creatinine: 0.3 MG/DL — ABNORMAL LOW (ref 0.70–1.30)
GFR est AA: >60 mL/min/{1.73_m2} (ref >60)
GFR est non-AA: >60 mL/min/{1.73_m2} (ref >60)
Glucose: 63 mg/dL — ABNORMAL LOW (ref 65–100)
Potassium: 3.9 mmol/L (ref 3.5–5.1)
Sodium: 130 mmol/L — ABNORMAL LOW (ref 136–145)

## 2016-04-29 LAB — MAGNESIUM: Magnesium: 1.6 mg/dL (ref 1.6–2.4)

## 2016-04-29 LAB — PROTHROMBIN TIME + INR
INR: 1.2 — ABNORMAL HIGH (ref 0.9–1.1)
Prothrombin time: 12 s — ABNORMAL HIGH (ref 9.0–11.1)

## 2016-04-29 MED ORDER — ENOXAPARIN 60 MG/0.6 ML SUB-Q SYRINGE
60 mg/0.6 mL | Freq: Two times a day (BID) | SUBCUTANEOUS | Status: AC
Start: 2016-04-29 — End: 2016-04-29
  Administered 2016-04-29 – 2016-04-30 (×2): via SUBCUTANEOUS

## 2016-04-29 MED ORDER — GADOTERIDOL 279.3 MG/ML INTRAVENOUS SOLUTION
279.3 mg/mL | Freq: Once | INTRAVENOUS | Status: AC
Start: 2016-04-29 — End: 2016-04-29
  Administered 2016-04-29: 18:00:00 via INTRAVENOUS

## 2016-04-29 MED ORDER — MAGNESIUM SULFATE 2 GRAM/50 ML IVPB
2 gram/50 mL (4 %) | Freq: Once | INTRAVENOUS | Status: AC
Start: 2016-04-29 — End: 2016-04-29
  Administered 2016-04-29: 15:00:00 via INTRAVENOUS

## 2016-04-29 MED ORDER — SODIUM CHLORIDE 0.9 % IJ SYRG
Freq: Once | INTRAMUSCULAR | Status: AC
Start: 2016-04-29 — End: 2016-04-29
  Administered 2016-04-29: 18:00:00 via INTRAVENOUS

## 2016-04-29 MED FILL — CHILDREN'S ASPIRIN 81 MG CHEWABLE TABLET: 81 mg | ORAL | Qty: 1

## 2016-04-29 MED FILL — CEFTRIAXONE 1 GRAM SOLUTION FOR INJECTION: 1 gram | INTRAMUSCULAR | Qty: 1

## 2016-04-29 MED FILL — LOVENOX 60 MG/0.6 ML SUBCUTANEOUS SYRINGE: 60 mg/0.6 mL | SUBCUTANEOUS | Qty: 0.6

## 2016-04-29 MED FILL — NORMAL SALINE FLUSH 0.9 % INJECTION SYRINGE: INTRAMUSCULAR | Qty: 10

## 2016-04-29 MED FILL — MAGNESIUM SULFATE 2 GRAM/50 ML IVPB: 2 gram/50 mL (4 %) | INTRAVENOUS | Qty: 50

## 2016-04-29 MED FILL — ELIQUIS 5 MG TABLET: 5 mg | ORAL | Qty: 2

## 2016-04-29 MED FILL — LIPITOR 20 MG TABLET: 20 mg | ORAL | Qty: 1

## 2016-04-29 MED FILL — PROHANCE 279.3 MG/ML INTRAVENOUS SOLUTION: 279.3 mg/mL | INTRAVENOUS | Qty: 11

## 2016-04-29 MED FILL — TAMSULOSIN SR 0.4 MG 24 HR CAP: 0.4 mg | ORAL | Qty: 1

## 2016-04-29 MED FILL — AZITHROMYCIN 250 MG TAB: 250 mg | ORAL | Qty: 2

## 2016-04-29 NOTE — Progress Notes (Signed)
PCCM:    Noted PET scan. Suggest bx of left supraclavicular lymph node. Biopsy of primary lesion won't help with staging. No plans at this time for EBUS.     Oncology is involved.     We will be available again to see if needed.    Bruce Trantham Lovena Le, PA-C

## 2016-04-29 NOTE — Progress Notes (Addendum)
Hospitalist Progress Note  Delice Lesch, NP  Office: [phone]  Cell: [phone]      Date of Service:  04/29/2016  NAME:  Bruce Clark  DOB:  [birth year]  MRN:  [id number]      Admission Summary:   [name] is a 65 y/o AA male who presented with right sided chest pain,cough productive of mainly whitish sputum, fever on 9/5.  He was admitted with pneumonia and acute right leg DVT    PMHx of hernia repair in 12/2015, BPH, smoker of 1 PPD  Interval history / Subjective:   CT guided bx for Thursday as patient had a dose of Eliquis last night.  Will receive therapeutic lovenox for 24 hours.  No anticoagulation on Wednesday, 9/13 in preparation for bx on Thursday.  All of this has been discussed with patient, attending and oncology.   Radiologist would like to obtain an MRI of LS for better visualization.  Will re discuss w/ Dr. Tobe Sos after imaging is completed.   Day 6 of abx  No new complaints  Sodium stable  Replete mag       Assessment & Plan:     Community acquired pneumonia  - CTA chest was negative for PE, however revealed bilateral lung nodules, mediastinal and hilar adenopathy , peribronchial dz and ILD  - Continue abx, day 5   ??  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 supraclavicular adenopathy left greater than right with increased metabolic activity. There is retroperitoneal and bilateral iliac adenopathy with increased metabolic activity and findings are suspicious for metastatic disease. 2. There is bony metastatic disease. There is soft tissue in the paravertebral region adjacent to C7 suspicious for soft tissue extension. 3. Prostate gland is enlarged and there is increased block activity in the  prostate gland anteriorly which could be related to primary neoplasm or possibly   Prostatitis. 4. There is a right lower lobe pulmonary mass with increased metabolic activity  of 4.2 which could represent a primary or metastatic lesion..  - consult oncology  - pt reports both parents had cancer, dad had a brain tumor, unsure of what kind of cancer mom had  ??  COPD  - new dx this admission  - keep sat's > 90%  - pulmonary following    Acute Lacunar stroke (POA)  - with left UE weakness,patient reported symptoms dated 2-3 weeks back p  9/8 Echo EF 55-60%, suboptimal visualization  - 9/8 carotid dopplers prelim: less than 50% stenosis and antegrade flow  - continue asa, statin    Acute Right leg DVT  - pt had traveled back in May to [place] when he had a hernia repair.  No hx of familial blood clots or bleeding disorders.  - pt has no pcp and no insurance.  CM has been communicating with [facility].  They are not able to check PTT's so frequently per CM.  Decision made to place pt on Eliquis.  [employer] will pay for first month supply   - CTA ruled out for PE  ??  Left hydroureteronephrosis  - 9/5 CT A/P: marked heterogeneous enlargement of the prostate gland. Moderate left  hydroureteronephrosis to the level of the mid ureter  - Urology saw and recommended outpatient follow up   - 9/11 PET scan: Prostate is enlarged and there is increased metabolic activity within  the  prostate gland anteriorly.  There is left hydroureteronephrosis with a dilated left ureter into the superior pelvis where it appears obstructed    Hypomagnesemia  - replete as needed  ??  Hyponatremia   - in the setting of probable lung neoplasm    BPH  - continue flomax  ??  Tobacco dependence  - counseled on smoking cessation    Severe Protein Malnutrition  - Body mass index is 19.59 kg/(m^2)  - nutritional supplements  ??    Code status: Full  DVT prophylaxis: Eliquis  Care Plan discussed with: patient, nurse, Attending MD  Disposition: Has appointment set up with [facility], [employer]  will provide Eliquis on discharge.      Hospital Problems  Date Reviewed: 2016-04-25          Codes Class Noted POA    Pneumonia ICD-10-CM: J18.9  ICD-9-CM: 062  Apr 25, 2016 Unknown                Review of Systems:   + 10 lb weight loss in 3 months  Fluctuating appetite x 3 months with a decrease in appetite  Quit smoking 3 weeks ago        Vital Signs:    Last 24hrs VS reviewed since prior progress note. Most recent are:  Visit Vitals   ??? BP 93/58   ??? Pulse 94   ??? Temp 98.5 ??F (36.9 ??C)   ??? Resp 17   ??? Ht '5\' 6"'$  (1.676 m)   ??? Wt 55.1 kg (121 lb 6 oz)   ??? SpO2 95%   ??? BMI 19.59 kg/m2         Intake/Output Summary (Last 24 hours) at 04/29/16 3762  Last data filed at 04/29/16 0155   Gross per 24 hour   Intake              300 ml   Output             1250 ml   Net             -950 ml        Physical Examination:             Constitutional:  No acute distress, cooperative, pleasant??   ENT:  Oral mucous moist, oropharynx benign. Neck supple   Resp:  Diminished bs posteriorly, scattered rhonchi.   No accessory muscle use   CV:  Regular rhythm, normal rate, no murmurs, gallops, rubs    GI:  Soft, non distended, non tender. normoactive bowel sounds, no hepatosplenomegaly     Musculoskeletal:  RLE larger than left, non pitting, not painful or hot.   warm, 2+ pulses throughout    Neurologic:  Moves all extremities.  AAOx3, CN II-XII reviewed     Psych:  Fair insight, Not anxious nor agitated.  Skin:  Good turgor, no rashes or ulcers       Data Review:    Review and/or order of clinical lab test  Review and/or order of tests in the radiology section of CPT  Review and/or order of tests in the medicine section of CPT      Labs:     Recent Labs      04/27/16   0313   WBC  6.7   HGB  12.4   HCT  34.4*   PLT  246     Recent Labs      04/29/16   0400  04/28/16  1346  04/27/16   0313   NA  130*  128*  131*   K  3.9  4.0  3.9   CL  97  96*  100   CO2  20*  19*  21   BUN  4*  4*  5*   CREA  0.30*  0.35*  0.38*   GLU  63*  86  84    CA  9.1  9.4  9.3   MG  1.6  1.3*  1.2*     No results for input(s): SGOT, GPT, ALT, AP, TBIL, TBILI, TP, ALB, GLOB, GGT, AML, LPSE in the last 72 hours.    No lab exists for component: AMYP, HLPSE  Recent Labs      04/29/16   0400  04/28/16   0251  04/27/16   1126   INR  1.2*  1.1  1.1   PTP  12.0*  11.4*  11.6*      No results for input(s): FE, TIBC, PSAT, FERR in the last 72 hours.   No results found for: FOL, RBCF   No results for input(s): PH, PCO2, PO2 in the last 72 hours.  No results for input(s): CPK, CKNDX, TROIQ in the last 72 hours.    No lab exists for component: CPKMB  Lab Results   Component Value Date/Time    Cholesterol, total 140 04/23/2016 03:59 AM    HDL Cholesterol 59 04/23/2016 03:59 AM    LDL, calculated 70.4 04/23/2016 03:59 AM    Triglyceride 53 04/23/2016 03:59 AM    CHOL/HDL Ratio 2.4 04/23/2016 03:59 AM     Lab Results   Component Value Date/Time    Glucose (POC) 103 04/28/2016 10:31 AM    Glucose (POC) 90 04/26/2016 10:33 PM    Glucose (POC) 71 04/26/2016 05:12 PM    Glucose (POC) 106 04/26/2016 12:03 PM    Glucose (POC) 117 04/25/2016 04:52 PM     Lab Results   Component Value Date/Time    Color YELLOW/STRAW 04/22/2016 08:22 PM    Appearance CLEAR 04/22/2016 08:22 PM    Specific gravity 1.025 04/22/2016 08:22 PM    pH (UA) 5.5 04/22/2016 08:22 PM    Protein NEGATIVE  04/22/2016 08:22 PM    Glucose NEGATIVE  04/22/2016 08:22 PM    Ketone TRACE 04/22/2016 08:22 PM    Urobilinogen 1.0 04/22/2016 08:22 PM    Nitrites NEGATIVE  04/22/2016 08:22 PM    Leukocyte Esterase NEGATIVE  04/22/2016 08:22 PM    Epithelial cells FEW 04/22/2016 08:22 PM    Bacteria NEGATIVE  04/22/2016 08:22 PM    WBC 0-4 04/22/2016 08:22 PM    RBC 0-5 04/22/2016 08:22 PM         Medications Reviewed:     Current Facility-Administered Medications   Medication Dose Route Frequency   ??? ondansetron (ZOFRAN) injection 4 mg  4 mg IntraVENous Q8H PRN   ??? azithromycin (ZITHROMAX) tablet 500 mg  500 mg Oral DAILY    ??? tamsulosin (FLOMAX) capsule 0.4 mg  0.4 mg Oral DAILY   ??? atorvastatin (LIPITOR) tablet 20 mg  20 mg Oral DAILY   ??? sodium chloride (NS) flush 5-10 mL  5-10 mL IntraVENous Q8H   ??? sodium chloride (NS) flush 5-10 mL  5-10 mL IntraVENous PRN   ??? cefTRIAXone (ROCEPHIN) 1 g in 0.9% sodium chloride (MBP/ADV) 50 mL  1 g IntraVENous Q24H   ??? acetaminophen (TYLENOL) tablet 650 mg  650 mg  Oral Q4H PRN   ??? aspirin chewable tablet 81 mg  81 mg Oral DAILY   ??? bisacodyl (DULCOLAX) tablet 5 mg  5 mg Oral DAILY PRN     ______________________________________________________________________  EXPECTED LENGTH OF STAY: 3d 14h  ACTUAL LENGTH OF STAY:          7                 Murtaza Shell V, NP

## 2016-04-29 NOTE — Progress Notes (Signed)
Spoke to Dr. Annice Needy in regards to pt having low BPs but asymptomatic.  Orders received to hold tamsulosin if SBP < 110, and to encourage oral intake of fluids and food.  Will notify MD if pt becomes symptomatic.

## 2016-04-29 NOTE — Progress Notes (Signed)
Bedside and Verbal shift change report given to Lynndyl (oncoming nurse) by Ladora Daniel RN (offgoing nurse). Report included the following information SBAR, Kardex, Intake/Output, MAR and Recent Results.

## 2016-04-29 NOTE — Progress Notes (Addendum)
Bedside and Verbal shift change report given to Building services engineer (oncoming nurse) by Genice Rouge RN (offgoing nurse). Report included the following information SBAR, Kardex, OR Summary, Intake/Output, MAR and Recent Results.

## 2016-04-29 NOTE — Progress Notes (Signed)
Problem: Self Care Deficits Care Plan (Adult)  Goal: *Acute Goals and Plan of Care (Insert Text)  Occupational Therapy Goals  Initiated 04/24/2016   1. Patient will perform grooming with LUE standing at sink with modified independence within 7 day(s).  2. Patient will perform self-feeding with LUE with assistance from RUE PRN for container mgmt with modified independence within 7 day(s).  3. Patient will participate in L upper extremity therapeutic exercise/activities with modified independence for 15 minutes within 7 day(s).   4. Patient will utilize energy conservation techniques during functional activities with verbal cues within 7 day(s).  5. Patient will improve their Fugl Meyer score by 5 points within 7 days.   OCCUPATIONAL THERAPY TREATMENT  Patient: Bruce Clark (65 y.o. male)  Date: 04/29/2016  Diagnosis: Abnormal chest x-ray [R93.8] <principal problem not specified>  Procedure(s) (LRB):  BRONCHOSCOPY with EBUS  (N/A)    Precautions:        ASSESSMENT:  Patient is progressing in all areas with L UE ROM, strength and coordination. Patient asking to have OT come in tomorrow when his wife is present to ensure independence and safety or home program as he discharges on Thursday. (Wife completes all medications and financial management).      Recommend with nursing patient to complete as able in order to maintain strength, endurance and independence: ADLs with supervision/setup, OOB to chair 3x/day and mobilizing to the bathroom for toileting without assist. Thank you for your assistance.      Progression toward goals:  '[X]'$        Improving appropriately and progressing toward goals  '[ ]'$        Improving slowly and progressing toward goals  '[ ]'$        Not making progress toward goals and plan of care will be adjusted       PLAN:  Patient continues to benefit from skilled intervention to address the above impairments.  Continue treatment per established plan of care.   Discharge Recommendations:  Outpatient L UE wrist -digits  Further Equipment Recommendations for Discharge:  None noted       SUBJECTIVE:   Patient stated ???It is just this hand, I am doing everything already.???      OBJECTIVE DATA SUMMARY:   Cognitive/Behavioral Status:  Neurologic State: Alert  Orientation Level: Oriented X4  Cognition: Appropriate for age attention/concentration              Functional Mobility and Transfers for ADLs:  Bed Mobility:        Transfers:           Balance:        ADL Intervention:           Patient received supine and left same. Patient stating no skilled acute OT needs at this time as he has progressed, completing full bathing and dressing in bathroom sitting on commode, opening all food containers, and walking halls. Patient demonstrated opening and closing all meal tray items and even cap on BSHSI mug straw. Commended patient on job well done.      Noted L supraclavicular nodule in chart, patient shoulder flexion WDL and functional.      In prep for discharge home discussed all home items of clothing may encounter. As practice can use his zip pull up and tie jacket as all zippers on clothing and tying shoes to increase hand coordination and FM. Education on benefits and encouraged during this session increasing standing tolerance, standing during exercise and ADLs  in prep for discharge home. Patient declining stating has been OOB, sitting EOB for meals and to and from bathroom today. Patient with noted difficulty with complex processing, application of tasks to home environment and so patient asking for OT to come back tomorrow when wife present.                                 Neuro Re-Education:           Therapeutic Exercises:   Patient stating that he is completing and then demonstrating with sponge FM and GM exercises. Noted wrist flexion to neutral extension, L digits still decreased abduction/adduction and full extension.   Pain:  Pain Scale 1: Numeric (0 - 10)   Pain Intensity 1: 0              Activity Tolerance:   Low BP  Please refer to the flowsheet for vital signs taken during this treatment.  After treatment:   '[ ]'$  Patient left in no apparent distress sitting up in chair  '[X]'$  Patient left in no apparent distress in bed  '[X]'$  Call bell left within reach  '[X]'$  Nursing notified  '[ ]'$  Caregiver present  '[ ]'$  Bed alarm activated      COMMUNICATION/COLLABORATION:   The patient???s plan of care was discussed with: Registered Nurse     Shari Heritage  Time Calculation: 13 mins

## 2016-04-30 LAB — METABOLIC PANEL, BASIC
Anion gap: 12 mmol/L (ref 5–15)
BUN/Creatinine ratio: 19 (ref 12–20)
BUN: 7 MG/DL (ref 6–20)
CO2: 21 mmol/L (ref 21–32)
Calcium: 9.3 MG/DL (ref 8.5–10.1)
Chloride: 95 mmol/L — ABNORMAL LOW (ref 97–108)
Creatinine: 0.37 MG/DL — ABNORMAL LOW (ref 0.70–1.30)
GFR est AA: >60 mL/min/{1.73_m2} (ref >60)
GFR est non-AA: >60 mL/min/{1.73_m2} (ref >60)
Glucose: 80 mg/dL (ref 65–100)
Potassium: 3.9 mmol/L (ref 3.5–5.1)
Sodium: 128 mmol/L — ABNORMAL LOW (ref 136–145)

## 2016-04-30 LAB — PROTHROMBIN TIME + INR
INR: 1.1 (ref 0.9–1.1)
Prothrombin time: 11.6 s — ABNORMAL HIGH (ref 9.0–11.1)

## 2016-04-30 LAB — MAGNESIUM: Magnesium: 1.6 mg/dL (ref 1.6–2.4)

## 2016-04-30 MED ORDER — MAGNESIUM OXIDE 400 MG TAB
400 mg | Freq: Two times a day (BID) | ORAL | Status: DC
Start: 2016-04-30 — End: 2016-05-01
  Administered 2016-04-30 – 2016-05-01 (×3): via ORAL

## 2016-04-30 MED FILL — NORMAL SALINE FLUSH 0.9 % INJECTION SYRINGE: INTRAMUSCULAR | Qty: 10

## 2016-04-30 MED FILL — MAGNESIUM OXIDE 400 MG TAB: 400 mg | ORAL | Qty: 1

## 2016-04-30 MED FILL — CHILDREN'S ASPIRIN 81 MG CHEWABLE TABLET: 81 mg | ORAL | Qty: 1

## 2016-04-30 MED FILL — AZITHROMYCIN 250 MG TAB: 250 mg | ORAL | Qty: 2

## 2016-04-30 MED FILL — LOVENOX 60 MG/0.6 ML SUBCUTANEOUS SYRINGE: 60 mg/0.6 mL | SUBCUTANEOUS | Qty: 0.6

## 2016-04-30 MED FILL — LIPITOR 20 MG TABLET: 20 mg | ORAL | Qty: 1

## 2016-04-30 MED FILL — CEFTRIAXONE 1 GRAM SOLUTION FOR INJECTION: 1 gram | INTRAMUSCULAR | Qty: 1

## 2016-04-30 NOTE — Progress Notes (Addendum)
Hospitalist Progress Note  Delice Lesch, NP  Office: [phone]  Cell: [phone]      Date of Service:  04/30/2016  NAME:  Bruce Clark  DOB:  12/01/1950  MRN:  [id number]      Admission Summary:   Bruce Clark is a 65 y/o AA male who presented with right sided chest pain,cough productive of mainly whitish sputum, fever on 9/5.  He was admitted with pneumonia and acute right leg DVT    PMHx of hernia repair in 12/2015, BPH, smoker of 1 PPD  Interval history / Subjective:   Npo after midnight for Ct guided bx tomorrow  Dr. Lennie Odor office will follow up on biopsy results with patient  Patient and significant other to meet at 8:30 am with Palliative medicine for AMD  Anticipate d/c tomorrow with resumption of Eliquis, [employer] will provide  Pt has follow up set up for Monday at [facility]  Pt will complete abx today for 7 day total   No new complaints  Sodium stable  Replete mag orally     Assessment & Plan:     Community acquired pneumonia  - CTA chest was negative for PE, however revealed bilateral lung nodules, mediastinal and hilar adenopathy , peribronchial dz and ILD  - Continue abx, day 7  ??  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 supraclavicular adenopathy left greater than right with increased metabolic activity. There is retroperitoneal and bilateral iliac adenopathy with increased metabolic activity and findings are suspicious for metastatic disease. 2. There is bony metastatic disease. There is soft tissue in the paravertebral region adjacent to C7 suspicious for soft tissue extension. 3. Prostate gland is enlarged and there is increased block activity in the  prostate gland anteriorly which could be related to primary neoplasm or possibly   Prostatitis. 4. There is a right lower lobe pulmonary mass with increased metabolic activity  of 4.2 which could represent a primary or metastatic lesion..  - consult oncology  - pt reports both parents had cancer, dad had a brain tumor, unsure of what kind of cancer mom had  ??  COPD  - new dx this admission  - keep sat's > 90%  - pulmonary following    Acute Lacunar stroke (POA)  - with left UE weakness,patient reported symptoms dated 2-3 weeks back p  9/8 Echo EF 55-60%, suboptimal visualization  - 9/8 carotid dopplers prelim: less than 50% stenosis and antegrade flow  - continue asa, statin    Acute Right leg DVT  - pt had traveled back in May to [place] when he had a hernia repair.  No hx of familial blood clots or bleeding disorders.  - pt has no pcp and no insurance.  CM has been communicating with [facility].  They are not able to check PTT's so frequently per CM.  Decision made to place pt on Eliquis.  [employer] [facility] will pay for first month supply   - CTA ruled out for PE  ??  Left hydroureteronephrosis  - 9/5 CT A/P: marked heterogeneous enlargement of the prostate gland. Moderate left  hydroureteronephrosis to the level of the mid ureter  - Urology saw and recommended outpatient follow up   - 9/11 PET scan: Prostate is enlarged and there is increased metabolic activity within the  prostate gland  anteriorly.  There is left hydroureteronephrosis with a dilated left ureter into the superior pelvis where it appears obstructed    Hypomagnesemia  - replete as needed  ??  Hyponatremia   - in the setting of probable lung neoplasm  - stable and not symptomatic    BPH  - continue flomax  ??  Tobacco dependence  - counseled on smoking cessation    Severe Protein Malnutrition  - Body mass index is 19.59 kg/(m^2)  - nutritional supplements  ??    Code status: Full  DVT prophylaxis: Eliquis  Care Plan discussed with: patient, nurse, Attending MD   Disposition: Has appointment set up with [facility], [employer] will provide Eliquis on discharge.      Hospital Problems  Date Reviewed: May 22, 2016          Codes Class Noted POA    Pneumonia ICD-10-CM: J18.9  ICD-9-CM: 678  05-22-16 Unknown                Review of Systems:   + 10 lb weight loss in 3 months  Fluctuating appetite x 3 months with a decrease in appetite  Quit smoking 3 weeks ago        Vital Signs:    Last 24hrs VS reviewed since prior progress note. Most recent are:  Visit Vitals   ??? BP 107/66   ??? Pulse 69   ??? Temp 98.5 ??F (36.9 ??C)   ??? Resp 16   ??? Ht '5\' 6"'$  (1.676 m)   ??? Wt 55.1 kg (121 lb 6 oz)   ??? SpO2 98%   ??? BMI 19.59 kg/m2         Intake/Output Summary (Last 24 hours) at 04/30/16 1238  Last data filed at 04/29/16 2231   Gross per 24 hour   Intake                0 ml   Output              850 ml   Net             -850 ml        Physical Examination:             Constitutional:  No acute distress, cooperative, pleasant??   ENT:  Oral mucous moist, oropharynx benign. Neck supple   Resp:  Diminished bs posteriorly, scattered rhonchi.   No accessory muscle use   CV:  Regular rhythm, normal rate, no murmurs, gallops, rubs    GI:  Soft, non distended, non tender. normoactive bowel sounds, no hepatosplenomegaly     Musculoskeletal:  RLE larger than left, non pitting, not painful or hot.   warm, 2+ pulses throughout    Neurologic:  Moves all extremities.  AAOx3, CN II-XII reviewed     Psych:  Fair insight, Not anxious nor agitated.  Skin:  Good turgor, no rashes or ulcers       Data Review:    Review and/or order of clinical lab test  Review and/or order of tests in the radiology section of CPT  Review and/or order of tests in the medicine section of CPT      Labs:     No results for input(s): WBC, HGB, HCT, PLT, HGBEXT, HCTEXT, PLTEXT, HGBEXT, HCTEXT, PLTEXT in the last 72 hours.  Recent Labs      04/30/16   0346  04/29/16   0400  04/28/16   1346   NA  128*  130*  128*   K  3.9  3.9  4.0    CL  95*  97  96*   CO2  21  20*  19*   BUN  7  4*  4*   CREA  0.37*  0.30*  0.35*   GLU  80  63*  86   CA  9.3  9.1  9.4   MG  1.6  1.6  1.3*     No results for input(s): SGOT, GPT, ALT, AP, TBIL, TBILI, TP, ALB, GLOB, GGT, AML, LPSE in the last 72 hours.    No lab exists for component: AMYP, HLPSE  Recent Labs      04/30/16   0346  04/29/16   0400  04/28/16   0251   INR  1.1  1.2*  1.1   PTP  11.6*  12.0*  11.4*      No results for input(s): FE, TIBC, PSAT, FERR in the last 72 hours.   No results found for: FOL, RBCF   No results for input(s): PH, PCO2, PO2 in the last 72 hours.  No results for input(s): CPK, CKNDX, TROIQ in the last 72 hours.    No lab exists for component: CPKMB  Lab Results   Component Value Date/Time    Cholesterol, total 140 04/23/2016 03:59 AM    HDL Cholesterol 59 04/23/2016 03:59 AM    LDL, calculated 70.4 04/23/2016 03:59 AM    Triglyceride 53 04/23/2016 03:59 AM    CHOL/HDL Ratio 2.4 04/23/2016 03:59 AM     Lab Results   Component Value Date/Time    Glucose (POC) 103 04/28/2016 10:31 AM    Glucose (POC) 90 04/26/2016 10:33 PM    Glucose (POC) 71 04/26/2016 05:12 PM    Glucose (POC) 106 04/26/2016 12:03 PM    Glucose (POC) 117 04/25/2016 04:52 PM     Lab Results   Component Value Date/Time    Color YELLOW/STRAW 04/22/2016 08:22 PM    Appearance CLEAR 04/22/2016 08:22 PM    Specific gravity 1.025 04/22/2016 08:22 PM    pH (UA) 5.5 04/22/2016 08:22 PM    Protein NEGATIVE  04/22/2016 08:22 PM    Glucose NEGATIVE  04/22/2016 08:22 PM    Ketone TRACE 04/22/2016 08:22 PM    Urobilinogen 1.0 04/22/2016 08:22 PM    Nitrites NEGATIVE  04/22/2016 08:22 PM    Leukocyte Esterase NEGATIVE  04/22/2016 08:22 PM    Epithelial cells FEW 04/22/2016 08:22 PM    Bacteria NEGATIVE  04/22/2016 08:22 PM    WBC 0-4 04/22/2016 08:22 PM    RBC 0-5 04/22/2016 08:22 PM         Medications Reviewed:     Current Facility-Administered Medications   Medication Dose Route Frequency    ??? magnesium oxide (MAG-OX) tablet 400 mg  400 mg Oral BID   ??? ondansetron (ZOFRAN) injection 4 mg  4 mg IntraVENous Q8H PRN   ??? tamsulosin (FLOMAX) capsule 0.4 mg  0.4 mg Oral DAILY   ??? atorvastatin (LIPITOR) tablet 20 mg  20 mg Oral DAILY   ??? sodium chloride (NS) flush 5-10 mL  5-10 mL IntraVENous Q8H   ??? sodium chloride (NS) flush 5-10 mL  5-10 mL IntraVENous PRN   ??? cefTRIAXone (ROCEPHIN) 1 g in 0.9% sodium chloride (MBP/ADV) 50 mL  1 g IntraVENous Q24H   ??? acetaminophen (TYLENOL) tablet 650 mg  650 mg Oral Q4H PRN   ??? aspirin chewable tablet  81 mg  81 mg Oral DAILY   ??? bisacodyl (DULCOLAX) tablet 5 mg  5 mg Oral DAILY PRN     ______________________________________________________________________  EXPECTED LENGTH OF STAY: 3d 14h  ACTUAL LENGTH OF STAY:          8                 Lajada Janes V, NP

## 2016-04-30 NOTE — Consults (Signed)
Palliative Medicine Consult  [place]: [phone])    Patient Name: Bruce Clark  Date of Birth: 1951/07/18    Date of Initial Consult: 04/30/16  Reason for Consult: New metastatic disease, establish POA  Requesting Provider: Delice Lesch  Primary Care Physician: None      SUMMARY:   Ocean Schildt is a 65 y.o. w/ hx of10-15 years of tobacco use who was admitted on 04/22/2016 from home with several months of N/V, weight loss, and abdominal pain. Imaging has revealed RLL lung mass, b/l lung nodules, diffuse lymphadenopathy and diffuse bony disease (throughout thoracic/lumbar spine and pelvis). Plan is for CT guided bx tmrw, 9/14. Also w/ PNA, acute lacunar CVA and RLE DVT.     Current medical issues leading to Palliative Medicine involvement include: support and to assist w/ AMD.      PALLIATIVE DIAGNOSES:   1. Weight loss  2. LUE weakness   3. Generalized weakness  4. Nausea      PLAN:   1. Meet w/ pt who is welcoming to the conversation although not overly forthcoming. Explain that our team is here to offer more support.   2. Seems to have an understanding of his situation, although when I asked him what he understood about his medical condition he talked primarily about his LUE weakness from the CVA and his DVT. I ask him if his team is talking about the concern for cancer- and he is aware of this and the bx that will occur tmrw, but I am not certain he understands the fact that his scans show widespread disease.   3. Trying to stay positive, taking it "one day at a time." Although has diffuse disease, luckily does not have any pain issues.   4. Pt is interested in completing an AMD- I note that care management has asked pastoral care to assist- I can also help w/ this since already involved. Pt lives w/ [name], a friend, since 2006 and trusts her to make medical decisions on his behalf if he cannot. Pt not married, no children. Has a nephew who lives nearby. Feels supported. Discuss that [name] does  not have to be present to have AMD completed, but he wishes that she is here- thus I will return tmrw AM.   5. Will follow.  6. Initial consult note routed to primary continuity provider  7. Communicated plan of care with: Palliative IDT       GOALS OF CARE / TREATMENT PREFERENCES:   [====Goals of Care====]  GOALS OF CARE:  Patient / health care proxy stated goals: maintain function       TREATMENT PREFERENCES:   Code Status: Full Code    Advance Care Planning:  Advance Care Planning 04/23/2016   Patient's Healthcare Decision Maker is: Verbal statement (Legal Next of Kin remains as decision maker)   Primary Decision Maker Name [name]   Primary Decision Maker Phone Number [phone]   Primary Decision Maker Relationship to Patient Friend   Confirm Advance Directive None   Patient Would Like to Complete Advance Directive Yes       Other:    The palliative care team has discussed with patient / health care proxy about goals of care / treatment preferences for patient.  [====Goals of Care====]         HISTORY:     History obtained from: Patient, chart     CHIEF COMPLAINT: Weakness     HPI/SUBJECTIVE:    The patient is:  Verbal and participatory   Non-participatory due to:     Pt in NAD, denies pain- the abdominal pain he had on admission has improved. Drinking Ensure- trying to eat well. No recent N/V. Has LUE weakness, found to have lacunar CVA this admission - working w/ therapies and doing his exercises.     Clinical Pain Assessment (nonverbal scale for severity on nonverbal patients):   [++++ Clinical Pain Assessment++++]  [++++Pain Severity++++]: Pain: 0  [++++Pain Character++++]:   [++++Pain Duration++++]:   [++++Pain Effect++++]:   [++++Pain Factors++++]:   [++++Pain Frequency++++]:   [++++Pain Location++++]:   [++++ Clinical Pain Assessment++++]  Duration: for how long has pt been experiencing pain (e.g., 2 days, 1 month, years)  Frequency: how often pain is an issue (e.g., several times per day, once  every few days, constant)     FUNCTIONAL ASSESSMENT:     Palliative Performance Scale (PPS):  PPS: 60       PSYCHOSOCIAL/SPIRITUAL SCREENING:     Advance Care Planning:  Advance Care Planning 04/23/2016   Patient's Healthcare Decision Maker is: Verbal statement (Legal Next of Kin remains as decision maker)   Primary Decision Maker Name [name]   Primary Decision Maker Phone Number [phone]   Primary Decision Maker Relationship to Patient Friend   Confirm Advance Directive None   Patient Would Like to Complete Advance Directive Yes        Any spiritual / religious concerns:   Yes /   No    Caregiver Burnout:   Yes /   No /   No Caregiver Present      Anticipatory grief assessment:    Normal  /  Maladaptive       ESAS Anxiety: Anxiety: 0    ESAS Depression: Depression: 0        REVIEW OF SYSTEMS:     Positive and pertinent negative findings in ROS are noted above in HPI.  The following systems were  reviewed /  unable to be reviewed as noted in HPI  Other findings are noted below.  Systems: constitutional, ears/nose/mouth/throat, respiratory, gastrointestinal, genitourinary, musculoskeletal, integumentary, neurologic, psychiatric, endocrine. Positive findings noted below.  Modified ESAS Completed by: provider   Fatigue: 4 Drowsiness: 0   Depression: 0 Pain: 0   Anxiety: 0 Nausea: 0   Anorexia: 3 Dyspnea: 0     Constipation: No     Stool Occurrence(s): 1        PHYSICAL EXAM:     From RN flowsheet:  Wt Readings from Last 3 Encounters:   04/22/16 121 lb 6 oz (55.1 kg)     Blood pressure 106/63, pulse 76, temperature 98.5 ??F (36.9 ??C), resp. rate 17, height 5' 6"  (1.676 m), weight 121 lb 6 oz (55.1 kg), SpO2 96 %.    Pain Scale 1: Numeric (0 - 10)  Pain Intensity 1: 0     Pain Location 1: Abdomen     Pain Description 1: Aching  Pain Intervention(s) 1: Rest    Constitutional: awake, alert, oriented, thin   Eyes: pupils equal, anicteric  ENMT: no nasal discharge, moist mucous membranes   Respiratory: breathing not labored  Gastrointestinal: soft non-tender  Musculoskeletal: no deformity, no tenderness to palpation  Skin: warm, dry  Neurologic: following commands, moving all extremities but LUE weak   Psychiatric: full affect, no hallucinations       HISTORY:     Active Problems:    Pneumonia (04/22/2016)      History  reviewed. No pertinent past medical history.   Past Surgical History:   Procedure Laterality Date   ??? HX HERNIA REPAIR        History reviewed. No pertinent family history.   History reviewed, no pertinent family history.  Social History   Substance Use Topics   ??? Smoking status: Not on file   ??? Smokeless tobacco: Not on file   ??? Alcohol use Not on file     No Known Allergies   Current Facility-Administered Medications   Medication Dose Route Frequency   ??? magnesium oxide (MAG-OX) tablet 400 mg  400 mg Oral BID   ??? ondansetron (ZOFRAN) injection 4 mg  4 mg IntraVENous Q8H PRN   ??? tamsulosin (FLOMAX) capsule 0.4 mg  0.4 mg Oral DAILY   ??? atorvastatin (LIPITOR) tablet 20 mg  20 mg Oral DAILY   ??? sodium chloride (NS) flush 5-10 mL  5-10 mL IntraVENous Q8H   ??? sodium chloride (NS) flush 5-10 mL  5-10 mL IntraVENous PRN   ??? cefTRIAXone (ROCEPHIN) 1 g in 0.9% sodium chloride (MBP/ADV) 50 mL  1 g IntraVENous Q24H   ??? acetaminophen (TYLENOL) tablet 650 mg  650 mg Oral Q4H PRN   ??? aspirin chewable tablet 81 mg  81 mg Oral DAILY   ??? bisacodyl (DULCOLAX) tablet 5 mg  5 mg Oral DAILY PRN          LAB AND IMAGING FINDINGS:     Lab Results   Component Value Date/Time    WBC 6.7 04/27/2016 03:13 AM    HGB 12.4 04/27/2016 03:13 AM    PLATELET 246 04/27/2016 03:13 AM     Lab Results   Component Value Date/Time    Sodium 128 04/30/2016 03:46 AM    Potassium 3.9 04/30/2016 03:46 AM    Chloride 95 04/30/2016 03:46 AM    CO2 21 04/30/2016 03:46 AM    BUN 7 04/30/2016 03:46 AM    Creatinine 0.37 04/30/2016 03:46 AM    Calcium 9.3 04/30/2016 03:46 AM    Magnesium 1.6 04/30/2016 03:46 AM      Lab Results    Component Value Date/Time    AST (SGOT) 58 04/24/2016 04:19 AM    Alk. phosphatase 99 04/24/2016 04:19 AM    Protein, total 6.6 04/24/2016 04:19 AM    Albumin 2.9 04/24/2016 04:19 AM    Globulin 3.7 04/24/2016 04:19 AM     Lab Results   Component Value Date/Time    INR 1.1 04/30/2016 03:46 AM    Prothrombin time 11.6 04/30/2016 03:46 AM      No results found for: IRON, FE, TIBC, IBCT, PSAT, FERR   No results found for: PH, PCO2, PO2  No components found for: [facility]   Lab Results   Component Value Date/Time    CK 94 04/22/2016 04:17 PM    CK - MB <1.0 04/22/2016 04:17 PM                Total time: 50 min   Counseling / coordination time, spent as noted above: 35 min   > 50% counseling / coordination?: yes    Prolonged service was provided for  30 min   75 min in face to face time in the presence of the patient, spent as noted above.  Time Start:   Time End:   Note: this can only be billed with [phone] (initial) or [phone] (follow up).  If multiple start / stop times, list each separately.

## 2016-04-30 NOTE — Other (Signed)
Bedside and Verbal shift change report given to Anderson Malta (Soil scientist) by Glennon Mac (offgoing nurse). Report included the following information SBAR, Kardex, ED Summary, Procedure Summary, MAR, Accordion and Recent Results.

## 2016-04-30 NOTE — Other (Signed)
Bedside and Verbal shift change report given to Shanon Brow (Soil scientist) by Glennon Mac (offgoing nurse). Report included the following information SBAR, Kardex, ED Summary, Intake/Output, MAR, Accordion and Recent Results.

## 2016-04-30 NOTE — Progress Notes (Signed)
Chart reviewed, patient received sitting EOB with nursing A to wash back only. Missed seeing wife this am, she apparently is only here for 1/2 hour when she does come. So tomorrow to meet with her at 8:30 as able to ensure no questions about discharge. Shari Heritage, M.S., OTR/L

## 2016-04-30 NOTE — Progress Notes (Addendum)
Hematology/Oncology Progress Note    REASON FOR VISIT: Metastatic Disease on PET    HISTORY OF PRESENT ILLNESS: [name] is a 65 y.o. male who presented to the Emergency Department on 04/22/16 with nausea, vomiting, and abdominal pain. He states that the nausea and vomiting began about three months ago. His appetite has been down for about this same amount of time and he has not tolerated food intake. He tolerates oral intake okay per his report, mostly drinks juices. He endorses early satiety. He has lost about 12-14 pounds in the past few months unintentionally. The pain in his abdomen is in his left lower quadrant and intermittent in nature. It is controlled today. He denies changes to his bowel habits. He states he has had some constipation and diarrhea over the past few months but that it was infrequent. He denies any headaches, dizziness, or recent falls. He denies shortness of breath and dyspnea with exertion. He states he has had a productive cough, no hemoptysis. Has had two episodes of nosebleeds about a week ago but these both stopped with pressure. Denies other bleeding to include hematuria and melena. He is currently on Eliquis for right leg DVT. CTA revealed bilateral lung nodules and mediastinal/hilar adenopathy. Pulmonary is on board and planning for EBUS. PET obtained today revealed supraclavicular adenopathy, retroperitoneal adenopathy, bilateral iliac adenopathy, mediastinal/hilar adenopathy, bony metastatic disease, right lower lobe pulmonary mass, and multiple other smaller masses in bilateral lungs for which we are consulted. History significant for hernia repair.    He denies personal cancer history. Significant cigarette smoking history as he started at age 44 and quit 3-4 weeks ago per patient. He used to drink alcohol but stopped about 6 or 7 months ago due to taste changes. He smoked marijuana in his 20's but none since that time. Denies illicit drug use. Lives in [place].     INTERVAL HISTORY: Feeling well this morning. His close family friend who he lives with, Bruce Clark, is present this morning. Plan for CT-guided biopsy tomorrow, probable lymph node vs lumbar mets. He denies any pain.,    History reviewed. No pertinent past medical history.    Past Surgical History:   Procedure Laterality Date   ??? HX HERNIA REPAIR         No Known Allergies    Current Facility-Administered Medications   Medication Dose Route Frequency Provider Last Rate Last Dose   ??? magnesium oxide (MAG-OX) tablet 400 mg  400 mg Oral BID Diana Minor V, NP   400 mg at 04/30/16 0842   ??? ondansetron (ZOFRAN) injection 4 mg  4 mg IntraVENous Q8H PRN Schuyler Amor, MD   4 mg at 04/27/16 1447   ??? tamsulosin (FLOMAX) capsule 0.4 mg  0.4 mg Oral DAILY Koleen Distance, MD   Stopped at 04/30/16 0900   ??? atorvastatin (LIPITOR) tablet 20 mg  20 mg Oral DAILY Schuyler Amor, MD   20 mg at 04/30/16 2956   ??? sodium chloride (NS) flush 5-10 mL  5-10 mL IntraVENous Q8H Toy Baker, MD   10 mL at 04/30/16 0352   ??? sodium chloride (NS) flush 5-10 mL  5-10 mL IntraVENous PRN Toy Baker, MD   10 mL at 04/29/16 0037   ??? cefTRIAXone (ROCEPHIN) 1 g in 0.9% sodium chloride (MBP/ADV) 50 mL  1 g IntraVENous Q24H Diana Minor V, NP 100 mL/hr at 04/29/16 2232 1 g at 04/29/16 2232   ??? acetaminophen (TYLENOL) tablet 650 mg  650  mg Oral Q4H PRN Toy Baker, MD   650 mg at 04/27/16 2156   ??? aspirin chewable tablet 81 mg  81 mg Oral DAILY Toy Baker, MD   81 mg at 04/30/16 5035   ??? bisacodyl (DULCOLAX) tablet 5 mg  5 mg Oral DAILY PRN Toy Baker, MD           Social History     Social History   ??? Marital status: SINGLE     Spouse name: N/A   ??? Number of children: N/A   ??? Years of education: N/A     Social History Main Topics   ??? Smoking status: None   ??? Smokeless tobacco: None   ??? Alcohol use None   ??? Drug use: None   ??? Sexual activity: Not Asked     Other Topics Concern   ??? None     Social History Narrative        History reviewed. No pertinent family history.    ROS  As per the HPI, otherwise a comprehensive ROS is negative.  ECOG PS is 1  Emotional well being addressed and patient is coping well.    Physical Examination:   Visit Vitals   ??? BP 100/63   ??? Pulse 74   ??? Temp 98.8 ??F (37.1 ??C)   ??? Resp 16   ??? Ht 5' 6"  (1.676 m)   ??? Wt 121 lb 6 oz (55.1 kg)   ??? SpO2 96%   ??? BMI 19.59 kg/m2     General appearance - alert, pleasant, conversant, no distress  Mental status - oriented to person, place, and time  Mouth - mucous membranes moist  Neck - supple, adenopathy as below  Lymphatics - palpable cervical adenopathy, no palpable supraclavicular adenopathy  Chest - diminished bases bilaterally, no wheezes, rales or rhonchi, symmetric air entry  Heart - normal rate, regular rhythm, normal S1, S2, no murmurs, rubs, clicks or gallops  Abdomen - soft, nontender, nondistended, no masses or organomegaly, bowel sounds present  Neurological - normal speech, unable to assess gait, no focal findings or movement disorder noted  Musculoskeletal - no joint tenderness, deformity or swelling  Extremities - peripheral pulses normal, mild RLE edema  Skin - warm, dry, intact    LABS  Lab Results   Component Value Date/Time    WBC 6.7 04/27/2016 03:13 AM    HGB 12.4 04/27/2016 03:13 AM    HCT 34.4 04/27/2016 03:13 AM    PLATELET 246 04/27/2016 03:13 AM    MCV 84.5 04/27/2016 03:13 AM    ABS. NEUTROPHILS 3.4 04/27/2016 03:13 AM     Lab Results   Component Value Date/Time    Sodium 128 04/30/2016 03:46 AM    Potassium 3.9 04/30/2016 03:46 AM    Chloride 95 04/30/2016 03:46 AM    CO2 21 04/30/2016 03:46 AM    Glucose 80 04/30/2016 03:46 AM    BUN 7 04/30/2016 03:46 AM    Creatinine 0.37 04/30/2016 03:46 AM    GFR est AA >60 04/30/2016 03:46 AM    GFR est non-AA >60 04/30/2016 03:46 AM    Calcium 9.3 04/30/2016 03:46 AM     Lab Results   Component Value Date/Time    AST (SGOT) 58 04/24/2016 04:19 AM    Alk. phosphatase 99 04/24/2016 04:19 AM     Protein, total 6.6 04/24/2016 04:19 AM    Albumin 2.9 04/24/2016 04:19 AM    Globulin 3.7 04/24/2016 04:19 AM  A-G Ratio 0.8 04/24/2016 04:19 AM       IMAGING  PET 04/28/2016  IMPRESSION:   1. There is supraclavicular adenopathy left greater than right with increased  metabolic activity. There is retroperitoneal and bilateral iliac adenopathy with  increased metabolic activity and findings are suspicious for metastatic disease.  There is also mediastinal and hilar adenopathy which demonstrates only slight  increased metabolic activity could be metastatic or possibly reactive.  2. There is left hydroureteronephrosis with a left ureter dilated to mid pelvis  suspicious for obstruction.  3. There is bony metastatic disease. There is soft tissue in the paravertebral  region adjacent to C7 suspicious for soft tissue extension.   4. Prostate gland is enlarged and there is increased block activity in the  prostate gland anteriorly which could be related to primary neoplasm or possibly  prostatitis  5. There is a right lower lobe pulmonary mass with increased metabolic activity  of 4.2 which could represent a primary or metastatic lesion.. There are multiple  other masses which are smaller which are demonstrating SUV of 2 or less may  represent metastatic lesions but less metabolic activity related to small size.  There is interstitial prominence and peribronchial thickening nonspecific but  could represent lymphangitic spread.    MRI Brain 04/23/16 - Small acute lacunar infarct in the superior posterior right frontal lobe.    CTA 04/23/16  IMPRESSION:   1. No pulmonary embolus.  2. Bilateral lung nodules.  3. Mediastinal and bilateral hilar adenopathy.  4. Peribronchial disease and interstitial lung disease.  5. Atherosclerotic aorta with coronary artery calcification.  6. Persistent left nephrogram with hydronephrosis.    ASSESSMENT  Mr. Hull is a 65 y.o. male who we are asked to see in consultation for  newly found widespread metastatic disease on PET today.    DISCUSSION/PLAN  1. Abnormal CTs with possible dominant lung mass/ could be metastatic disease of unknown primary.  CTA 04/23/16 revealed lung mass and adenopathy for which Pulmonary had planned EBUS.  Patient is on Eliquis for DVT so EBUS was on hold.   PET today revealed widespread mets to include bony metastatic disease, pulmonary nodules, and right lower lobe mass.  Reviewed scans today with patient and concern for malignancy.  Need tissue dx.    Discussed we need to move forward with a biopsy to determine site of origin.  Suspicious for lung primary given large RLL mass and smoking history.    Plan for CT guided biopsy tomorrow.   Discussed with Dr. Baxter Hire, Radiology. L4 or T12 would be best site to biopsy, preferred L4.  Will follow up outpatient next week with biopsy results.  Palliative Medicine consulted for additional support with new widespread metastatic cancer and assistance with establishing mPOA, Bruce Clark.    2. DVT. He is on Eliquis per primary team. Anticoagulation on hold for biopsy.    Appreciate the opportunity to be involved in Mr. Dowen's care. Please call with any questions.    Pt seen today in conjunction with NP A Sandridge  Reviewed all with pt and family/ friend Bruce Clark today.  We will follow here or as outpt    Alejandro Mulling, DO

## 2016-04-30 NOTE — Progress Notes (Signed)
Cm followed up with patient and his significant other [name]) in the room. Cm went over all instructions with [name], in regards to completing the paperwork for the [facility] and informing her of all items that will need to be brought to the appointment. They inquired about how to obtain a Chiropodist. Cm instructed them how to do both and they asked that the Medical POA be completed here. [name] will be available tomorrow morning at 8:30 am. Cm contacted the chaplains' department to see if they could assist with this.   [employer], [place]

## 2016-04-30 NOTE — Consults (Signed)
Palliative Medicine Consult  [place]: [phone])    Patient Name: Bruce Clark  Date of Birth: 1951/03/14    Date of Initial Consult: 04/30/16  Reason for Consult: New metastatic disease, establish POA  Requesting Provider: Delice Lesch  Primary Care Physician: None      SUMMARY:   Tamotsu Wiederholt is a 65 y.o. w/ hx of10-15 years of tobacco use who was admitted on 04/22/2016 from home with several months of N/V, weight loss, and abdominal pain. Imaging has revealed RLL lung mass, b/l lung nodules, diffuse lymphadenopathy and diffuse bony disease (throughout thoracic/lumbar spine and pelvis). Plan is for CT guided bx tmrw, 9/14. Also w/ PNA, acute lacunar CVA and RLE DVT.     Current medical issues leading to Palliative Medicine involvement include: support and to assist w/ AMD.      PALLIATIVE DIAGNOSES:   1. Weight loss  2. LUE weakness   3. Generalized weakness  4. Nausea      PLAN:   1. Meet w/ pt who is welcoming to the conversation although not overly forthcoming. Explain that our team is here to offer more support.   2. Seems to have an understanding of his situation, although when I asked him what he understood about his medical condition he talked primarily about his LUE weakness from the CVA and his DVT. I ask him if his team is talking about the concern for cancer- and he is aware of this and the bx that will occur tmrw, but I am not certain he understands the fact that his scans show widespread disease.   3. Trying to stay positive, taking it "one day at a time." Although has diffuse disease, luckily does not have any pain issues.   4. Pt is interested in completing an AMD- I note that care management has asked pastoral care to assist- I can also help w/ this since already involved. Pt lives w/ [name], a friend, since 2006 and trusts her to make medical decisions on his behalf if he cannot. Pt not married, no children. Has a nephew who lives nearby. Feels supported. Discuss that [name] does not have to  be present to have AMD completed, but he wishes that she is here- thus I will return tmrw AM.   5. Will follow.  6. Initial consult note routed to primary continuity provider  7. Communicated plan of care with: Palliative IDT       GOALS OF CARE / TREATMENT PREFERENCES:   [====Goals of Care====]  GOALS OF CARE:  Patient / health care proxy stated goals: maintain function       TREATMENT PREFERENCES:   Code Status: Full Code    Advance Care Planning:  Advance Care Planning 04/23/2016   Patient's Healthcare Decision Maker is: Verbal statement (Legal Next of Kin remains as decision maker)   Primary Decision Maker Name [name]   Primary Decision Maker Phone Number [phone]   Primary Decision Maker Relationship to Patient Friend   Confirm Advance Directive None   Patient Would Like to Complete Advance Directive Yes       Other:    The palliative care team has discussed with patient / health care proxy about goals of care / treatment preferences for patient.  [====Goals of Care====]         HISTORY:     History obtained from: Patient, chart     CHIEF COMPLAINT: Weakness     HPI/SUBJECTIVE:    The patient is:   '[x]'   Verbal and participatory  '[]'  Non-participatory due to:     Pt in NAD, denies pain- the abdominal pain he had on admission has improved. Drinking Ensure- trying to eat well. No recent N/V. Has LUE weakness, found to have lacunar CVA this admission - working w/ therapies and doing his exercises.     Clinical Pain Assessment (nonverbal scale for severity on nonverbal patients):   [++++ Clinical Pain Assessment++++]  [++++Pain Severity++++]: Pain: 0  [++++Pain Character++++]:   [++++Pain Duration++++]:   [++++Pain Effect++++]:   [++++Pain Factors++++]:   [++++Pain Frequency++++]:   [++++Pain Location++++]:   [++++ Clinical Pain Assessment++++]  Duration: for how long has pt been experiencing pain (e.g., 2 days, 1 month, years)  Frequency: how often pain is an issue (e.g., several times per day, once every few  days, constant)     FUNCTIONAL ASSESSMENT:     Palliative Performance Scale (PPS):  PPS: 60       PSYCHOSOCIAL/SPIRITUAL SCREENING:     Advance Care Planning:  Advance Care Planning 04/23/2016   Patient's Healthcare Decision Maker is: Verbal statement (Legal Next of Kin remains as decision maker)   Primary Decision Maker Name [name]   Primary Decision Maker Phone Number [phone]   Primary Decision Maker Relationship to Patient Friend   Confirm Advance Directive None   Patient Would Like to Complete Advance Directive Yes        Any spiritual / religious concerns:  '[]'  Yes /  '[x]'  No    Caregiver Burnout:  '[]'  Yes /  '[x]'  No /  '[x]'  No Caregiver Present      Anticipatory grief assessment:   '[x]'  Normal  / '[]'  Maladaptive       ESAS Anxiety: Anxiety: 0    ESAS Depression: Depression: 0        REVIEW OF SYSTEMS:     Positive and pertinent negative findings in ROS are noted above in HPI.  The following systems were '[x]'  reviewed / '[]'  unable to be reviewed as noted in HPI  Other findings are noted below.  Systems: constitutional, ears/nose/mouth/throat, respiratory, gastrointestinal, genitourinary, musculoskeletal, integumentary, neurologic, psychiatric, endocrine. Positive findings noted below.  Modified ESAS Completed by: provider   Fatigue: 4 Drowsiness: 0   Depression: 0 Pain: 0   Anxiety: 0 Nausea: 0   Anorexia: 3 Dyspnea: 0     Constipation: No     Stool Occurrence(s): 1        PHYSICAL EXAM:     From RN flowsheet:  Wt Readings from Last 3 Encounters:   04/22/16 121 lb 6 oz (55.1 kg)     Blood pressure 106/63, pulse 76, temperature 98.5 ??F (36.9 ??C), resp. rate 17, height '5\' 6"'  (1.676 m), weight 121 lb 6 oz (55.1 kg), SpO2 96 %.    Pain Scale 1: Numeric (0 - 10)  Pain Intensity 1: 0     Pain Location 1: Abdomen     Pain Description 1: Aching  Pain Intervention(s) 1: Rest    Constitutional: awake, alert, oriented, thin   Eyes: pupils equal, anicteric  ENMT: no nasal discharge, moist mucous membranes  Respiratory:  breathing not labored  Gastrointestinal: soft non-tender  Musculoskeletal: no deformity, no tenderness to palpation  Skin: warm, dry  Neurologic: following commands, moving all extremities but LUE weak   Psychiatric: full affect, no hallucinations       HISTORY:     Active Problems:    Pneumonia (04/22/2016)      History  reviewed. No pertinent past medical history.   Past Surgical History:   Procedure Laterality Date   ??? HX HERNIA REPAIR        History reviewed. No pertinent family history.   History reviewed, no pertinent family history.  Social History   Substance Use Topics   ??? Smoking status: Not on file   ??? Smokeless tobacco: Not on file   ??? Alcohol use Not on file     No Known Allergies   Current Facility-Administered Medications   Medication Dose Route Frequency   ??? magnesium oxide (MAG-OX) tablet 400 mg  400 mg Oral BID   ??? ondansetron (ZOFRAN) injection 4 mg  4 mg IntraVENous Q8H PRN   ??? tamsulosin (FLOMAX) capsule 0.4 mg  0.4 mg Oral DAILY   ??? atorvastatin (LIPITOR) tablet 20 mg  20 mg Oral DAILY   ??? sodium chloride (NS) flush 5-10 mL  5-10 mL IntraVENous Q8H   ??? sodium chloride (NS) flush 5-10 mL  5-10 mL IntraVENous PRN   ??? cefTRIAXone (ROCEPHIN) 1 g in 0.9% sodium chloride (MBP/ADV) 50 mL  1 g IntraVENous Q24H   ??? acetaminophen (TYLENOL) tablet 650 mg  650 mg Oral Q4H PRN   ??? aspirin chewable tablet 81 mg  81 mg Oral DAILY   ??? bisacodyl (DULCOLAX) tablet 5 mg  5 mg Oral DAILY PRN          LAB AND IMAGING FINDINGS:     Lab Results   Component Value Date/Time    WBC 6.7 04/27/2016 03:13 AM    HGB 12.4 04/27/2016 03:13 AM    PLATELET 246 04/27/2016 03:13 AM     Lab Results   Component Value Date/Time    Sodium 128 04/30/2016 03:46 AM    Potassium 3.9 04/30/2016 03:46 AM    Chloride 95 04/30/2016 03:46 AM    CO2 21 04/30/2016 03:46 AM    BUN 7 04/30/2016 03:46 AM    Creatinine 0.37 04/30/2016 03:46 AM    Calcium 9.3 04/30/2016 03:46 AM    Magnesium 1.6 04/30/2016 03:46 AM      Lab Results   Component Value  Date/Time    AST (SGOT) 58 04/24/2016 04:19 AM    Alk. phosphatase 99 04/24/2016 04:19 AM    Protein, total 6.6 04/24/2016 04:19 AM    Albumin 2.9 04/24/2016 04:19 AM    Globulin 3.7 04/24/2016 04:19 AM     Lab Results   Component Value Date/Time    INR 1.1 04/30/2016 03:46 AM    Prothrombin time 11.6 04/30/2016 03:46 AM      No results found for: IRON, FE, TIBC, IBCT, PSAT, FERR   No results found for: PH, PCO2, PO2  No components found for: [facility]   Lab Results   Component Value Date/Time    CK 94 04/22/2016 04:17 PM    CK - MB <1.0 04/22/2016 04:17 PM                Total time: 50 min   Counseling / coordination time, spent as noted above: 35 min   > 50% counseling / coordination?: yes    Prolonged service was provided for  '[]' 30 min   '[]' 75 min in face to face time in the presence of the patient, spent as noted above.  Time Start:   Time End:   Note: this can only be billed with [phone] (initial) or [phone] (follow up).  If multiple start / stop times, list each separately.

## 2016-05-01 ENCOUNTER — Inpatient Hospital Stay: Payer: Charity | Primary: Internal Medicine

## 2016-05-01 ENCOUNTER — Inpatient Hospital Stay: Admit: 2016-05-01 | Payer: Charity | Primary: Internal Medicine

## 2016-05-01 ENCOUNTER — Inpatient Hospital Stay

## 2016-05-01 LAB — PROTHROMBIN TIME + INR
INR: 1.1 (ref 0.9–1.1)
Prothrombin time: 11.1 s (ref 9.0–11.1)

## 2016-05-01 LAB — PTT: aPTT: 36.6 s — ABNORMAL HIGH (ref 22.1–32.5)

## 2016-05-01 MED ORDER — ASPIRIN 81 MG CHEWABLE TAB
81 mg | ORAL_TABLET | Freq: Every day | ORAL | 0 refills | Status: DC
Start: 2016-05-01 — End: 2016-06-26

## 2016-05-01 MED ORDER — LIDOCAINE (PF) 10 MG/ML (1 %) IJ SOLN
10 mg/mL (1 %) | Freq: Once | INTRAMUSCULAR | Status: AC
Start: 2016-05-01 — End: 2016-05-01
  Administered 2016-05-01: 14:00:00 via SUBCUTANEOUS

## 2016-05-01 MED ORDER — ATORVASTATIN 20 MG TAB
20 mg | ORAL_TABLET | Freq: Every day | ORAL | 0 refills | Status: DC
Start: 2016-05-01 — End: 2016-09-26

## 2016-05-01 MED ORDER — MAGNESIUM OXIDE 400 MG TAB
400 mg | ORAL_TABLET | Freq: Two times a day (BID) | ORAL | 0 refills | Status: DC
Start: 2016-05-01 — End: 2016-06-26

## 2016-05-01 MED FILL — LIPITOR 20 MG TABLET: 20 mg | ORAL | Qty: 1

## 2016-05-01 MED FILL — CEFTRIAXONE 1 GRAM SOLUTION FOR INJECTION: 1 gram | INTRAMUSCULAR | Qty: 1

## 2016-05-01 MED FILL — NORMAL SALINE FLUSH 0.9 % INJECTION SYRINGE: INTRAMUSCULAR | Qty: 10

## 2016-05-01 MED FILL — CHILDREN'S ASPIRIN 81 MG CHEWABLE TABLET: 81 mg | ORAL | Qty: 1

## 2016-05-01 MED FILL — MAGNESIUM OXIDE 400 MG TAB: 400 mg | ORAL | Qty: 1

## 2016-05-01 NOTE — ACP (Advance Care Planning) (Signed)
Advanced care planning:      Today completed first part of AMD with pt Bruce Clark, prefers to complete living will w/ Bruce Clark present- or just to have good discussions.      Names close friend Bruce Clark [phone]) as primary agent and friend Bruce Clark [phone]) as secondary.

## 2016-05-01 NOTE — Progress Notes (Addendum)
Palliative Medicine Consult  [place]: [phone])    Patient Name: Bruce Clark  Date of Birth: Dec 22, 1950    Date of Initial Consult: 04/30/16  Reason for Consult: New metastatic disease, establish POA  Requesting Provider: Delice Lesch  Primary Care Physician: None      SUMMARY:   Bruce Clark is a 65 y.o. w/ hx of10-15 years of tobacco use who was admitted on 04/22/2016 from home with several months of N/V, weight loss, and abdominal pain. Imaging has revealed RLL lung mass, b/l lung nodules, diffuse lymphadenopathy and diffuse bony disease (throughout thoracic/lumbar spine and pelvis). Plan is for CT guided bx tmrw, 9/14. Also w/ PNA, acute lacunar CVA and RLE DVT.     Current medical issues leading to Palliative Medicine involvement include: support and to assist w/ AMD.      PALLIATIVE DIAGNOSES:   1. Weight loss  2. LUE weakness   3. Generalized weakness  4. Nausea      PLAN:   1. Meet w/ pt and long time friend, Bruce Clark. Was going to complete AMD, however pt called down for bx during our conversation.   2. Spend time getting to know Bruce Clark- and she is able to tell me more about Bruce Clark who is not as talkative and forthcoming. She has been close friends w/ Bruce Clark since 2006- they just moved from [place] ~2 months ago. He is part of the family, very close to her grandson Bruce Clark and Rec[employer] consultant [employer]'s son.   3. Bruce Clark says that pt and she has good understanding of metastatic cancer- know that they will have to await bx to see what treatments may be available, but that likely it will be incurable. She will take care of pt no matter what- including caring for him at home as his disease progresses.   68. Bruce Clark shares that she took care of her father, mother, one of her brothers, and godfather at her home when they were terminally ill, sometimes w/ hospice. She was a 1st grade teacher, but has lots of hands on experience w/ medical care.56 lovely story about taking care of her  father for 1.5 years when he had incurable colon cancer, she did his medications, bathing, feeding. She is not afraid of death or the dying process and will be there for Bruce Clark.   5. Discuss palliative medicine and our role, right now do not think that clinic f/u needed as sx well managed but she has our contact info.   6. Will try to complete first part of AMD naming Bruce Clark as primary agent today, he may not want to do living will w/out her present- but she will have these important conversations regarding what means quality to him, how aggressive of tx he would want in future. She has good understanding of this- she does not think pt would want to be hooked up to machines, but will discuss further.   7. Plan: will return to complete AMD if pt wishes, at least first part naming health care agent. If not, will ask NNs in Onc to complete.   8. Communicated plan of care with: Palliative IDT; Glennon Mac RN    Addendum 12:10pm: Completed first part of AMD with pt, prefers to complete living will w/ Bruce Clark present- or just to have good discussions.  Names close friend Bruce Clark [phone]) as primary agent and friend Bruce Clark [phone]) as secondary.      GOALS OF CARE / TREATMENT PREFERENCES:   [====Goals of Care====]  GOALS  OF CARE:  Patient / health care proxy stated goals: maintain function       TREATMENT PREFERENCES:   Code Status: Full Code    Advance Care Planning:  Advance Care Planning 04/23/2016   Patient's Healthcare Decision Maker is: Verbal statement (Legal Next of Kin remains as decision maker)   Primary Decision Maker Name Bruce Clark   Primary Decision Maker Phone Number [phone]   Primary Decision Maker Relationship to Patient Friend   Confirm Advance Directive None   Patient Would Like to Complete Advance Directive Yes       Other:    The palliative care team has discussed with patient / health care proxy about goals of care / treatment preferences for patient.   [====Goals of Care====]         HISTORY:         HPI/SUBJECTIVE:    The patient is:    Verbal and participatory   Non-participatory due to:     Pt in NAD, denies pain. Slept well and hopes to go home today after bx.     Clinical Pain Assessment (nonverbal scale for severity on nonverbal patients):   [++++ Clinical Pain Assessment++++]  [++++Pain Severity++++]: Pain: 0  [++++Pain Character++++]:   [++++Pain Duration++++]:   [++++Pain Effect++++]:   [++++Pain Factors++++]:   [++++Pain Frequency++++]:   [++++Pain Location++++]:   [++++ Clinical Pain Assessment++++]  Duration: for how long has pt been experiencing pain (e.g., 2 days, 1 month, years)  Frequency: how often pain is an issue (e.g., several times per day, once every few days, constant)     FUNCTIONAL ASSESSMENT:     Palliative Performance Scale (PPS):  PPS: 60       PSYCHOSOCIAL/SPIRITUAL SCREENING:     Advance Care Planning:  Advance Care Planning 04/23/2016   Patient's Healthcare Decision Maker is: Verbal statement (Legal Next of Kin remains as decision maker)   Primary Decision Maker Name Bruce Clark   Primary Decision Maker Phone Number [phone]   Primary Decision Maker Relationship to Patient Friend   Confirm Advance Directive None   Patient Would Like to Complete Advance Directive Yes        Any spiritual / religious concerns:   Yes /   No    Caregiver Burnout:   Yes /   No /   No Caregiver Present      Anticipatory grief assessment:    Normal  /  Maladaptive       ESAS Anxiety: Anxiety: 0    ESAS Depression: Depression: 0        REVIEW OF SYSTEMS:     Positive and pertinent negative findings in ROS are noted above in HPI.  The following systems were  reviewed /  unable to be reviewed as noted in HPI  Other findings are noted below.  Systems: constitutional, ears/nose/mouth/throat, respiratory, gastrointestinal, genitourinary, musculoskeletal, integumentary, neurologic, psychiatric, endocrine. Positive findings noted below.   Modified ESAS Completed by: provider   Fatigue: 4 Drowsiness: 0   Depression: 0 Pain: 0   Anxiety: 0 Nausea: 0   Anorexia: 3 Dyspnea: 0     Constipation: No     Stool Occurrence(s): 1        PHYSICAL EXAM:     From RN flowsheet:  Wt Readings from Last 3 Encounters:   04/22/16 121 lb 6 oz (55.1 kg)     Blood pressure 95/60, pulse 74, temperature 98.5 ??F (36.9 ??C), resp. rate 16, height 5' 6"  (1.676  m), weight 121 lb 6 oz (55.1 kg), SpO2 95 %.    Pain Scale 1: Numeric (0 - 10)  Pain Intensity 1: 0     Pain Location 1: Abdomen     Pain Description 1: Aching  Pain Intervention(s) 1: Rest    Constitutional: awake, alert, oriented, thin   Eyes: pupils equal, anicteric  ENMT: no nasal discharge, moist mucous membranes  Respiratory: breathing not labored  Musculoskeletal: no deformity, no tenderness to palpation  Skin: warm, dry  Neurologic: following commands, moving all extremities but LUE weak. Can get up OOB and walk w/out assistance.   Psychiatric: full affect, no hallucinations       HISTORY:     Active Problems:    Pneumonia (04/22/2016)      History reviewed. No pertinent past medical history.   Past Surgical History:   Procedure Laterality Date   ??? HX HERNIA REPAIR        History reviewed. No pertinent family history.   History reviewed, no pertinent family history.  Social History   Substance Use Topics   ??? Smoking status: Not on file   ??? Smokeless tobacco: Not on file   ??? Alcohol use Not on file     No Known Allergies   Current Facility-Administered Medications   Medication Dose Route Frequency   ??? magnesium oxide (MAG-OX) tablet 400 mg  400 mg Oral BID   ??? ondansetron (ZOFRAN) injection 4 mg  4 mg IntraVENous Q8H PRN   ??? tamsulosin (FLOMAX) capsule 0.4 mg  0.4 mg Oral DAILY   ??? atorvastatin (LIPITOR) tablet 20 mg  20 mg Oral DAILY   ??? sodium chloride (NS) flush 5-10 mL  5-10 mL IntraVENous Q8H   ??? sodium chloride (NS) flush 5-10 mL  5-10 mL IntraVENous PRN    ??? acetaminophen (TYLENOL) tablet 650 mg  650 mg Oral Q4H PRN   ??? aspirin chewable tablet 81 mg  81 mg Oral DAILY   ??? bisacodyl (DULCOLAX) tablet 5 mg  5 mg Oral DAILY PRN          LAB AND IMAGING FINDINGS:     Lab Results   Component Value Date/Time    WBC 6.7 04/27/2016 03:13 AM    HGB 12.4 04/27/2016 03:13 AM    PLATELET 246 04/27/2016 03:13 AM     Lab Results   Component Value Date/Time    Sodium 128 04/30/2016 03:46 AM    Potassium 3.9 04/30/2016 03:46 AM    Chloride 95 04/30/2016 03:46 AM    CO2 21 04/30/2016 03:46 AM    BUN 7 04/30/2016 03:46 AM    Creatinine 0.37 04/30/2016 03:46 AM    Calcium 9.3 04/30/2016 03:46 AM    Magnesium 1.6 04/30/2016 03:46 AM      Lab Results   Component Value Date/Time    AST (SGOT) 58 04/24/2016 04:19 AM    Alk. phosphatase 99 04/24/2016 04:19 AM    Protein, total 6.6 04/24/2016 04:19 AM    Albumin 2.9 04/24/2016 04:19 AM    Globulin 3.7 04/24/2016 04:19 AM     Lab Results   Component Value Date/Time    INR 1.1 05/01/2016 04:34 AM    Prothrombin time 11.1 05/01/2016 04:34 AM    aPTT 36.6 05/01/2016 04:34 AM      No results found for: IRON, FE, TIBC, IBCT, PSAT, FERR   No results found for: PH, PCO2, PO2  No components found for: [facility]   Lab Results  Component Value Date/Time    CK 94 04/22/2016 04:17 PM    CK - MB <1.0 04/22/2016 04:17 PM                Total time: 40 min   Counseling / coordination time, spent as noted above: 35 min   > 50% counseling / coordination?: yes    Prolonged service was provided for  30 min   75 min in face to face time in the presence of the patient, spent as noted above.  Time Start:   Time End:   Note: this can only be billed with [phone] (initial) or [phone] (follow up).  If multiple start / stop times, list each separately.

## 2016-05-01 NOTE — Discharge Summary (Signed)
Discharge Summary       PATIENT ID: Bruce Clark  MRN: [id number]   DATE OF BIRTH: November 26, 1950    DATE OF ADMISSION: 04/22/2016  5:55 PM    DATE OF DISCHARGE: 05/01/2016   PRIMARY CARE PROVIDER: Dallas Breeding, MD     ATTENDING PHYSICIAN: Bruce Harsh, MD  DISCHARGING PROVIDER: Delice Lesch, NP    To contact this individual call [phone] and ask the operator to page.  If unavailable ask to be transferred the Adult Hospitalist Department.    CONSULTATIONS: IP CONSULT TO UROLOGY  IP CONSULT TO ONCOLOGY  IP CONSULT TO PALLIATIVE CARE - PROVIDER  IP CONSULT TO HOSPITALIST  IP CONSULT TO PULMONOLOGY    PROCEDURES/SURGERIES:     ADMITTING Bruce Clark:   Mr Bruce Clark is a 65 y/o AA male who presented with right sided chest pain,cough productive of mainly whitish sputum, fever on 9/5.  He was admitted with pneumonia, acute right leg DVT and acute lacunar stroke.  PMHx of hernia repair in 12/2015, BPH, smoker of 1 PPD.  PET Imaging has revealed RLL lung mass, b/l lung nodules, diffuse lymphadenopathy and diffuse bony disease (throughout thoracic/lumbar spine and pelvis).  EBUS was placed on hold 2/2 anticoagulation, evaluated by oncology, US guided biospy completed on 9/14.  Pt will f/u with Dr. Flonnie Clark on results.  Pt has no PCP or insurance.  [facility] and has been provided Eliquis through [employer]. Pt was also seen by palliative medicine to start AMD. Pt was also treated for CAP with 7 days of therapy.     DISCHARGE DIAGNOSES / PLAN:      Community acquired pneumonia - treated  - CTA chest was negative for PE, however revealed bilateral lung nodules, mediastinal and hilar adenopathy , peribronchial dz and ILD  - Continue abx, day 7  ????  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 supraclavicular adenopathy left greater than right with  increased metabolic activity. There is retroperitoneal and bilateral iliac adenopathy with increased metabolic activity and findings are suspicious for metastatic disease. 2. There is bony metastatic disease. There is soft tissue in the paravertebral region adjacent to C7 suspicious for soft tissue extension. 3. Prostate gland is enlarged and there is increased block activity in the  prostate gland anteriorly which could be related to primary neoplasm or possibly  Prostatitis. 4. There is a right lower lobe pulmonary mass with increased metabolic activity  of 4.2 which could represent a primary or metastatic lesion..  - consult oncology  - pt reports both parents had cancer, dad had a brain tumor, unsure of what kind of cancer mom had  ????  COPD  - new dx this admission  - keep sat's > 90%  - pulmonary following  ??  Acute Lacunar stroke (POA)  - with left UE weakness,patient reported symptoms dated 2-3 weeks back p  9/8 Echo EF 55-60%, suboptimal visualization  - 9/8 carotid dopplers prelim:??less than 50% stenosis and antegrade flow  - continue asa, statin  ??  Acute Right leg DVT  - pt had traveled back in May to [place] when he had a hernia repair.  No hx of familial blood clots or bleeding disorders.  - pt has no pcp and no insurance.  CM has been  communicating with [facility].  They are not able to check PTT's so frequently per CM.  Decision made to place pt on Eliquis.  Bruce Clark's will pay for first month supply   - CTA ruled out for PE  ????  Left hydroureteronephrosis  - 9/5 CT A/P: marked heterogeneous enlargement of the prostate gland. Moderate left  hydroureteronephrosis to the level of the mid ureter  - Urology saw and recommended outpatient follow up   - 9/11 PET scan: Prostate is enlarged and there is increased metabolic activity within the  prostate gland anteriorly.  There is left hydroureteronephrosis with a dilated left ureter into the superior pelvis where it appears obstructed  ??   Hypomagnesemia  - replete  ????  Hyponatremia   - in the setting of probable lung neoplasm  - stable and not symptomatic  ??  BPH  - d/c flomax in setting of lower bp and now on [facility], will f/u with urology  ????  Tobacco dependence  - counseled on smoking cessation  ??  Severe Protein Malnutrition  - Body mass index is 19.59 kg/(m^2)  - nutritional supplements       PENDING TEST RESULTS:   At the time of discharge the following test results are still pending: biopsy    FOLLOW UP APPOINTMENTS:    Follow-up Information     Follow up With Details Comments Contact Info    Bruce Ralph, MD  follow up in one week [place] [address]  [phone]      [facility] Go on 05/05/2016 An appointment has been made for you for 1:30 pm. Bring proof of income x2 months, your ID and [place] [address]      Bruce Mulling, DO Go on 05/07/2016 Follow up on 05/07/16 at 0900 to discuss biopsy results and plan moving forward [place] [address]  [phone]      Bruce Clark, [address]  [address]  [place] [address]  [phone]           ADDITIONAL CARE RECOMMENDATIONS:  - You will follow up with Dr. Flonnie Clark on 9/20 for results of biospy and treatment options  - You will follow up on Monday at the [facility], they will be your primary care providers.    - Your imaging showed you had a stroke on the right side of your brain. Continue to take a baby aspirin and lipitor for future stroke prevention.  See below for more information on Lipitor  - You have an acute Right leg DVT. You will take Eliquis for this.  You will take 10 mg twice a day for 7 days then 5 mg twice a day everyday.   - Being on aspirin and Eliquis will increase your risk of bleeding so be careful.   - Your pneumonia was treated with 7 days of antibiotics, you do not need anymore.    - Follow up with Dr. Meda Clark, urologist, in one week.  This follow up is for and Incidental finding of moderate left hydronephrosis (water in the kidney) on CT scan.   You were started on Flomax while in the hospital but this was stopped as your blood pressure was running on the lower side  - A summary of your hospital stay will be fowarded over to [facility]  ??  DIET: Regular  ??  ACTIVITY: as tolerated  ??  WOUND CARE: none  ??  EQUIPMENT needed: none      DISCHARGE MEDICATIONS:  Discharge Medication List as of 05/01/2016 12:41 PM      START taking these medications    Details   aspirin 81 mg chewable tablet Take 1 Tab by mouth daily., Print, Disp-30 Tab, R-0      atorvastatin (LIPITOR) 20 mg tablet Take 1 Tab by mouth daily., Print, Disp-30 Tab, R-0      magnesium oxide (MAG-OX) 400 mg tablet Take 1 Tab by mouth two (2) times a day., Print, Disp-14 Tab, R-0      !! apixaban (ELIQUIS) 5 mg tablet Take 2 Tabs by mouth two (2) times a day for 7 days. Indications: deep venous thrombosis, Print, Disp-28 Tab, R-0      !! apixaban (ELIQUIS) 5 mg tablet Take 1 Tab by mouth two (2) times a day. Indications: deep venous thrombosis, Print, Disp-60 Tab, R-0       !! - Potential duplicate medications found. Please discuss with provider.      STOP taking these medications       tamsulosin (FLOMAX) 0.4 mg capsule Comments:   Reason for Stopping:         naproxen sodium (ALEVE) 220 mg tablet Comments:   Reason for Stopping:                 NOTIFY YOUR PHYSICIAN FOR ANY OF THE FOLLOWING:   Fever over 101 degrees for 24 hours.   Chest pain, shortness of breath, fever, chills, nausea, vomiting, diarrhea, change in mentation, falling, weakness, bleeding. Severe pain or pain not relieved by medications.  Or, any other signs or symptoms that you may have questions about.    DISPOSITION:  xx  Home With:   OT  PT  HH  RN       Long term SNF/Inpatient Rehab    Independent/assisted living    Hospice    Other:        PATIENT CONDITION AT DISCHARGE:     Functional status    Poor     Deconditioned    xx Independent      Cognition   xx  Lucid     Forgetful     Dementia      Catheters/lines (plus indication)    Foley     PICC     PEG    xx None      Code status   xx  Full code     DNR      PHYSICAL EXAMINATION AT DISCHARGE:  Constitutional:  No acute distress, cooperative, pleasant??   ENT:  Oral mucous moist, oropharynx benign. Neck supple   Resp:  Diminished bs posteriorly, scattered rhonchi.   No accessory muscle use   CV:  Regular rhythm, normal rate, no murmurs, gallops, rubs    GI:  Soft, non distended, non tender. normoactive bowel sounds, no hepatosplenomegaly     Musculoskeletal:  RLE larger than left, non pitting, not painful or hot.   warm, 2+ pulses throughout    Neurologic:  Moves all extremities.  AAOx3, CN II-XII reviewed                                             Psych:  Fair insight, Not anxious nor  agitated.  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

## 2016-05-01 NOTE — ACP (Advance Care Planning) (Signed)
Advanced care planning:      Today completed first part of AMD with pt Bruce Clark, prefers to complete living will w/ Bruce Clark present- or just to have good discussions.      Names close friend Bruce Clark [phone]) as primary agent and friend Bruce Clark [phone]) as secondary.

## 2016-05-01 NOTE — Discharge Summary (Signed)
Discharge Summary by Bruce Clark, Bruce Coe, NP at 05/01/16 1338                Author: Belva Agee Bruce Coe, NP  Service: Internal Medicine  Author Type: Nurse Practitioner       Filed: 05/01/16 1534  Date of Service: 05/01/16 1338  Status: Signed           Editor: Bruce Clark, Bruce Coe, NP (Nurse Practitioner)  Cosigner: Bruce Harsh, MD at 05/12/16 1637                       Discharge Summary           PATIENT ID: Bruce Clark   MRN: [id number]     DATE OF BIRTH: 04/03/1951      DATE OF ADMISSION: 04/22/2016  5:55 PM      DATE OF DISCHARGE:  05/01/2016    PRIMARY CARE PROVIDER:  Dallas Breeding, MD       ATTENDING PHYSICIAN: Bruce Harsh, MD   DISCHARGING PROVIDER:  Delice Lesch, NP     To contact this individual call [phone] and ask the operator to page.  If unavailable ask to be transferred the Adult Hospitalist Department.      CONSULTATIONS: IP CONSULT TO UROLOGY   IP CONSULT TO ONCOLOGY   IP CONSULT TO PALLIATIVE CARE - PROVIDER   IP CONSULT TO HOSPITALIST   IP CONSULT TO PULMONOLOGY      PROCEDURES/SURGERIES:       ADMITTING [place]  COURSE:    Bruce Clark is a 65 y/o AA male who presented with right sided chest pain,cough productive of mainly whitish sputum, fever on 9/5.  He was admitted with pneumonia,  acute right leg DVT and acute lacunar stroke.  PMHx of hernia repair in 12/2015, BPH, smoker of 1 PPD.  PET Imaging has revealed RLL lung mass, b/l lung nodules, diffuse lymphadenopathy and diffuse  bony disease (throughout thoracic/lumbar spine and pelvis).  EBUS was placed on hold 2/2 anticoagulation, evaluated by oncology, US guided biospy completed on 9/14.  Pt will f/u with Bruce Clark on results.  Pt has no PCP or insurance.  He has been  [facility] and has been provided Eliquis through [employer]. Pt was also seen by palliative medicine to start AMD. Pt was also treated for CAP with 7 days of therapy.         DISCHARGE DIAGNOSES / P LAN:           Community acquired  pneumonia - treated   - CTA chest was negative for PE, however revealed bilateral lung nodules, mediastinal and hilar adenopathy , peribronchial dz and ILD   - Continue abx, day 7   ????   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 supraclavicular adenopathy left greater than right with increased metabolic activity. There is retroperitoneal and bilateral iliac adenopathy with increased metabolic activity and findings are suspicious for metastatic disease. 2. There  is bony metastatic disease. There is soft tissue in the paravertebral region adjacent to C7 suspicious for soft tissue extension. 3. Prostate gland is enlarged and there is increased block activity in the   prostate gland anteriorly which could be related to primary neoplasm or possibly   Prostatitis. 4. There  is a right lower lobe pulmonary mass with increased metabolic activity   of 4.2 which could represent a primary or metastatic lesion..   - consult oncology   - pt reports both parents had cancer, dad had a brain tumor, unsure of what kind of cancer mom had   ????   COPD   - new dx this admission   - keep sat's > 90%   - pulmonary following   ??   Acute Lacunar stroke (POA)   - with left UE weakness,patient reported symptoms dated 2-3 weeks back p   9/8 Echo EF 55-60%, suboptimal visualization   - 9/8 carotid dopplers prelim:??less than 50% stenosis and antegrade flow   - continue asa, statin   ??   Acute Right leg DVT   - pt had traveled back in May to [place] when he had a hernia repair.  No hx of familial blood clots or bleeding disorders.   - pt has no pcp and no insurance.  CM has been communicating with [facility].  They are not able to check PTT's so frequently per CM.  Decision made to place pt on Eliquis.  [employer] [facility] will pay for first month supply    - CTA ruled out for PE   ????   Left  hydroureteronephrosis   - 9/5 CT A/P: marked heterogeneous enlargement of the prostate gland. Moderate left   hydroureteronephrosis to the level of the mid ureter   - Urology saw and recommended outpatient follow up    - 9/11 PET scan: Prostate is enlarged and there is increased metabolic activity within the   prostate gland anteriorly.  There is left hydroureteronephrosis with a dilated left ureter into the superior pelvis where it appears obstructed   ??   Hypomagnesemia   - replete   ????   Hyponatremia    - in the setting of probable lung neoplasm   - stable and not symptomatic   ??   BPH   - d/c flomax in setting of lower bp and now on [facility], will f/u with urology   ????   Tobacco dependence   - counseled on smoking cessation   ??   Severe Protein Malnutrition   - Body mass index is 19.59 kg/(m^2)   - nutritional supplements           PENDING TEST RESULTS:    At the time of discharge the following test results are still pending: biopsy      FOLLOW UP APPOINTMENTS:       Follow-up Information        Follow up With  Details  Comments  Contact Info             Bruce Ralph, MD    follow up in one week  [place] [address]   [phone]                [facility]  Go on 05/05/2016  An appointment has been made for you for 1:30 pm. Bring proof of income x2 months, your ID and [place] [address]          Bruce Mulling, DO  Go on 05/07/2016  Follow up on 05/07/16 at 0900 to discuss biopsy results and plan moving forward  [facility]  [place]   [address]   [place] [address]   [phone]                 ADDITIONAL CARE RECOMMENDATIONS:   - You will follow up with Bruce Clark on 9/20 for results of biospy and treatment options   - You will follow up on Monday at the [facility], they will be your primary care  providers.     - Your imaging showed you had a stroke on the right side of your brain. Continue to take a baby aspirin and lipitor for future stroke prevention.  See below for more information on Lipitor   - You have an acute Right leg DVT. You will take Eliquis for this.  You will take 10 mg twice a day for 7 days then 5 mg twice a day everyday.    - Being on aspirin and Eliquis will increase your risk of bleeding so be careful.    - Your pneumonia was treated with 7 days of antibiotics, you do not need anymore.    - Follow up with Dr. Meda Coffee, urologist, in one week.  This follow up is for and Incidental finding of moderate left hydronephrosis (water  in the kidney) on CT scan.   You were started on Flomax while in the hospital but this was stopped as your blood pressure was running on the lower side   - A summary of your hospital stay will be fowarded over to [facility]   ??   DIET: Regular   ??   ACTIVITY: as tolerated   ??   WOUND CARE: none   ??   EQUIPMENT needed: none         DISCHARGE MEDICATIONS:     Discharge Medication List as of 05/01/2016 12:41 PM              START taking these medications          Details        aspirin 81 mg chewable tablet  Take 1 Tab by mouth daily., Print, Disp-30 Tab, R-0               atorvastatin (LIPITOR) 20 mg tablet  Take 1 Tab by mouth daily., Print, Disp-30 Tab, R-0               magnesium oxide (MAG-OX) 400 mg tablet  Take 1 Tab by mouth two (2) times a day., Print, Disp-14 Tab, R-0               !! apixaban (ELIQUIS) 5 mg tablet  Take 2 Tabs by mouth two (2) times a day for 7 days. Indications: deep venous thrombosis, Print, Disp-28 Tab, R-0               !! apixaban (ELIQUIS) 5 mg tablet  Take 1 Tab by mouth two (2) times a day. Indications: deep venous thrombosis, Print, Disp-60 Tab, R-0               !! - Potential duplicate medications found. Please discuss with provider.              STOP taking these medications                  tamsulosin (FLOMAX) 0.4 mg capsule  Comments:    Reason for Stopping:                      naproxen sodium (ALEVE) 220 mg tablet  Comments:    Reason for Stopping:                                NOTIFY YOUR PHYSICIAN FOR ANY OF THE FOLLOWING:    Fever over 101 degrees for 24 hours.    Chest pain, shortness of breath, fever, chills, nausea, vomiting, diarrhea, change in mentation, falling, weakness, bleeding. Severe pain or pain not relieved by medications.   Or, any other signs or symptoms that you may have questions about.      DISPOSITION:      xx   Home With:     OT    PT    HH    RN                   Long term SNF/Inpatient Rehab       Independent/assisted living          Hospice          Other:           PATIENT CONDITION AT DISCHARGE:       Functional status         Poor        Deconditioned         xx  Independent         Cognition       xx   Lucid        Forgetful           Dementia         Catheters/lines (plus indication)         Foley        PICC        PEG         xx  None         Code status       xx   Full code           DNR         PHYSICAL EXAMINATION AT DISCHARGE:      Constitutional:   No acute distress, cooperative, pleasant??     ENT:   Oral mucous moist, oropharynx benign. Neck supple     Resp:   Diminished bs posteriorly, scattered rhonchi.   No accessory muscle use     CV:   Regular rhythm, normal rate, no murmurs, gallops, rubs      GI:   Soft, non distended, non tender. normoactive bowel sounds, no hepatosplenomegaly       Musculoskeletal:   RLE larger than left, non pitting, not painful or hot.   warm, 2+ pulses throughout      Neurologic:   Moves all extremities.  AAOx3, CN II-XII reviewed                                               Psych:   Fair insight, Not anxious nor agitated.   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

## 2016-05-02 NOTE — Progress Notes (Signed)
Pt seen in hospital for abnormal CTs  Had a biopsy and path is pending  Pt will need f/u with [place] once path is back

## 2016-05-07 ENCOUNTER — Ambulatory Visit: Admit: 2016-05-07 | Discharge: 2016-05-07 | Attending: Internal Medicine | Primary: Internal Medicine

## 2016-05-07 ENCOUNTER — Inpatient Hospital Stay: Admit: 2016-05-07 | Payer: Charity | Primary: Internal Medicine

## 2016-05-07 ENCOUNTER — Encounter: Attending: Medical Oncology | Primary: Internal Medicine

## 2016-05-07 DIAGNOSIS — C61 Malignant neoplasm of prostate: Secondary | ICD-10-CM

## 2016-05-07 LAB — METABOLIC PANEL, COMPREHENSIVE
A-G Ratio: 0.6 — ABNORMAL LOW (ref 1.1–2.2)
ALT (SGPT): 32 U/L (ref 12–78)
AST (SGOT): 28 U/L (ref 15–37)
Albumin: 3.1 g/dL — ABNORMAL LOW (ref 3.5–5.0)
Alk. phosphatase: 128 U/L — ABNORMAL HIGH (ref 45–117)
Anion gap: 9 mmol/L (ref 5–15)
BUN/Creatinine ratio: 24 — ABNORMAL HIGH (ref 12–20)
BUN: 12 MG/DL (ref 6–20)
Bilirubin, total: 0.3 MG/DL (ref 0.2–1.0)
CO2: 23 mmol/L (ref 21–32)
Calcium: 10.3 MG/DL — ABNORMAL HIGH (ref 8.5–10.1)
Chloride: 101 mmol/L (ref 97–108)
Creatinine: 0.51 MG/DL — ABNORMAL LOW (ref 0.70–1.30)
GFR est AA: >60 mL/min/{1.73_m2} (ref >60)
GFR est non-AA: >60 mL/min/{1.73_m2} (ref >60)
Globulin: 5.3 g/dL — ABNORMAL HIGH (ref 2.0–4.0)
Glucose: 74 mg/dL (ref 65–100)
Potassium: 4.2 mmol/L (ref 3.5–5.1)
Protein, total: 8.4 g/dL — ABNORMAL HIGH (ref 6.4–8.2)
Sodium: 133 mmol/L — ABNORMAL LOW (ref 136–145)

## 2016-05-07 LAB — CBC WITH AUTOMATED DIFF
ABS. BASOPHILS: 0 10*3/uL (ref 0.0–0.1)
ABS. EOSINOPHILS: 0.1 10*3/uL (ref 0.0–0.4)
ABS. LYMPHOCYTES: 2.4 10*3/uL (ref 0.8–3.5)
ABS. MONOCYTES: 0.6 10*3/uL (ref 0.0–1.0)
ABS. NEUTROPHILS: 3.4 10*3/uL (ref 1.8–8.0)
BASOPHILS: 1 % (ref 0–1)
EOSINOPHILS: 1 % (ref 0–7)
HCT: 40.6 % (ref 36.6–50.3)
HGB: 13.8 g/dL (ref 12.1–17.0)
LYMPHOCYTES: 37 % (ref 12–49)
MCH: 29.9 PG (ref 26.0–34.0)
MCHC: 34 g/dL (ref 30.0–36.5)
MCV: 88.1 FL (ref 80.0–99.0)
MONOCYTES: 9 % (ref 5–13)
NEUTROPHILS: 52 % (ref 32–75)
PLATELET: 390 10*3/uL (ref 150–400)
RBC: 4.61 M/uL (ref 4.10–5.70)
RDW: 12.9 % (ref 11.5–14.5)
WBC: 6.4 10*3/uL (ref 4.1–11.1)

## 2016-05-07 MED ORDER — ONDANSETRON HCL 8 MG TAB
8 mg | ORAL_TABLET | Freq: Three times a day (TID) | ORAL | 0 refills | Status: DC | PRN
Start: 2016-05-07 — End: 2016-08-26

## 2016-05-07 MED ORDER — PANTOPRAZOLE 40 MG TAB, DELAYED RELEASE
40 mg | ORAL_TABLET | Freq: Every day | ORAL | 6 refills | Status: DC
Start: 2016-05-07 — End: 2016-08-26

## 2016-05-07 MED ORDER — DEXAMETHASONE 2 MG TAB
2 mg | ORAL_TABLET | Freq: Two times a day (BID) | ORAL | 3 refills | Status: DC
Start: 2016-05-07 — End: 2016-05-07

## 2016-05-07 MED ORDER — DEXAMETHASONE 2 MG TAB
2 mg | ORAL_TABLET | Freq: Two times a day (BID) | ORAL | 0 refills | Status: DC
Start: 2016-05-07 — End: 2016-06-26

## 2016-05-07 MED ORDER — APIXABAN 5 MG TABLET
5 mg | ORAL_TABLET | Freq: Two times a day (BID) | ORAL | 6 refills | Status: DC
Start: 2016-05-07 — End: 2016-05-27

## 2016-05-07 NOTE — Progress Notes (Signed)
ONCOLOGY NURSE NAVIGATOR    Bruce Clark and Bruce friend, Bruce Clark, were seen today following Bruce Clark's appt with Dr. Dian Clark presented to ED on 9/5 with fever, right chest pain and productive cough, N&V, abdominal pain and unintentional weight loss (>10 lbs).  He was admitted with pneumonia, right leg DVT and small lacunar stroke right frontal lobe. CT revealed bilat lung nodules and mediastinal/hilar adenopathy.  PET showed supraclavicular,mediastinal/hilar, retroperitoneal and bilat iliac adenopathy as well as bony metastatic disease (T- and L-spine and pelvis), RLL mass and multiple smaller lesions in bilat lungs.  He has left hydronephrosis and enlarged prostate.  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    Bruce Clark was discharged on 9/14.   Prior to discharge, he was seen by Pall [facility] and completed AMD.  He is here today to discuss biopsy results and establish care with Dr. Melony Clark.  He was seen at [facility] on 9/18 and has appt with Dr. Janeth Clark -- to establish PCP care -- on 9/22.  He has not seen Dr. Meda Clark for follow up r/t hydronephrosis.      Bruce Clark is uninsured.    He lives in [place] with friend, Bruce Clark and her family (her adult Clark, Bruce Clark and their 63 yo autistic Clark.)  Bruce Clark has a sister who lives in [place] who is aware that he lives with Bruce Clark and family.  He's not close to other family.  He is retired and receives Fish farm manager benefit, about $710 per month.  He does not drive.  Bruce Clark does not drive.  They suspect they may have transportation barriers to some appts.  Bruce Clark and Bruce Clark are employed FT.  They will aim to provide rides as possible for Bruce Clark and Bruce Clark but they are already pulled in their employment to provide for Bruce unique needs of their autistic Clark. They are also expecting their 2nd Clark in Feb, 2018.    Aldan has designated Bruce Clark as Bruce mProxy.  He is interested in appointing  her Bruce financial POA.  Bruce Clark keeps track of Bruce Clark's appt and medications.  She has all paperwork and is developing a system to track and organize.  She is upbeat and energetic but admits to feeling overwhelmed.  She has medical papers/apps she does not understand.    They both understand Bruce Clark has metastatic cancer that can't be cured.  He will have MRI spine and labs and RTC to discuss next steps with Dr. Melony Clark.  Bruce Clark recounts her experiences with caring for loved ones with cancer through EOL -- is earnest about caring for Bruce Clark feels better recently.  Eating better.  No nausea or vomiting or constipation.  Bruce pain is minimal and intermittent.  He is not taking analgesics. Ambulatory without problem.  Has some numbness in left arm and hand.  He's taking lipitor, eliquis (has one month free eliquis) and Mg.    Introduced self and described ONN role.  ONN actions/plan:    1.  Financial Barriers:  Identified that Bruce Clark was given app for Care Card when hospitalized.  Reviewed Bruce app with them and they will collect documentation needed to complete Bruce application.  I'll call [facility] to confirm that he was screened for Access Now at Bruce appt on 9/18.  Refer to [facility] -- is he eligible for SSDI and medicaid?  Will discuss with Bruce Clark and Bruce Clark at next appt.  Can't afford OOP  expenses for Rx today -- Foundation assistance provided.  FAP for eliquis per Dr. Rosina Clark nurse, Bruce Clark -- Bruce Clark understands what financial documentation is needed to complete Bruce application  He pays rent to Bruce Clark.  Bruce household provides support for each other.    2.  Transportation Barriers:  Evaluate needs based on treatment plan.  Refer to [facility] as needed  They do not live on bus line    3.  Advance Planning  AMD completed  Refer to [employer] for legal assist with getting affairs in order    4.  Coping/processing diagnosis  Beginning understanding of diagnosis and awaiting treatment plan per Dr. Melony Clark   Needs appt with Dr. Meda Clark -- Bruce Clark to call to schedule  Refer to [place] as needed  Can refer to [facility] for integrative supports as needed  Enc care for caregiver    Escorted to [facility] for labs and then directed to [facility] outpatient pharmacy to get Rx (decadron, zofran, protonix).    Will discuss with Bruce Clark, NN , and aim to support in tandem    Bruce Meyer MS RN AOCNS

## 2016-05-07 NOTE — Progress Notes (Signed)
Hematology/Oncology Consult    REASON FOR CONSULT: Metastatic prostate cancer  REQUESTED BY: Dr. Jackalyn Lombard    HISTORY OF PRESENT ILLNESS: Bruce Clark is a 65 y.o. male who is on Eliquis for a h/o DVT who presented to the Emergency Department on 04/22/16 with nausea, vomiting, and abdominal pain x 2 months, decreased appetite and weight loss of 10-14 lbs.  CTA revealed bilateral lung nodules and mediastinal/hilar adenopathy. PET revealed supraclavicular adenopathy, retroperitoneal adenopathy, bilateral iliac adenopathy, mediastinal/hilar adenopathy, bony metastatic disease, right lower lobe pulmonary mass, and multiple other smaller masses in bilateral lungs. USG guided bx of supraclavicular LN showed adenocarcinoma of the prostate. He was also diagnosed and treated for CAP and now presents to discuss management of his malignancy.    He denies personal cancer history. Father passed  from brain cancer. Significant cigarette smoking history as he started at age 39 and quit 3-4 weeks ago per patient. He used to drink alcohol but stopped about 6 or 7 months ago due to taste changes. He smoked marijuana in his 20's but none since that time. Denies illicit drug use. Lives in [place].    Appetite is back, nausea no longer exists, he states that BMs are regular without blood, Urinates frequently. He has mild neck pain, no weakness in your legs, He has had L hand tingling and numbness. Denies any HA , falls, diarrhea, CP, SOB, fevers, chills or night sweats. Still with fatigue.    Family is estranged, comes with a friend, he lives with her- [name].    No past medical history on file.    Past Surgical History:   Procedure Laterality Date   ??? HX HERNIA REPAIR         No Known Allergies    Current Outpatient Prescriptions   Medication Sig Dispense Refill   ??? aspirin 81 mg chewable tablet Take 1 Tab by mouth daily. 30 Tab 0   ??? atorvastatin (LIPITOR) 20 mg tablet Take 1 Tab by mouth daily. 30 Tab 0    ??? magnesium oxide (MAG-OX) 400 mg tablet Take 1 Tab by mouth two (2) times a day. 14 Tab 0   ??? apixaban (ELIQUIS) 5 mg tablet Take 1 Tab by mouth two (2) times a day. Indications: deep venous thrombosis 60 Tab 0       Social History     Social History   ??? Marital status: SINGLE     Spouse name: N/A   ??? Number of children: N/A   ??? Years of education: N/A     Social History Main Topics   ??? Smoking status: Not on file   ??? Smokeless tobacco: Not on file   ??? Alcohol use Not on file   ??? Drug use: Not on file   ??? Sexual activity: Not on file     Other Topics Concern   ??? Not on file     Social History Narrative       No family history on file.    ROS  A 12 point review of systems was obtained and is negative except as listed in HPI.  ECOG PS is 1    Physical Examination:   Visit Vitals   ??? BP 96/64   ??? Pulse 67   ??? Temp 97.9 ??F (36.6 ??C)   ??? Resp 16   ??? Ht 5' 6" (1.676 m)   ??? Wt 119 lb 3.2 oz (54.1 kg)   ??? SpO2 97%   ??? BMI 19.24  kg/m2     General appearance - alert, frail appearing, and in no distress  Poor historian   Mental status - oriented to person, place, and time  Mouth - mucous membranes moist, pharynx normal without lesions  Neck - supple, no significant adenopathy  Lymphatics - no palpable lymphadenopathy, no hepatosplenomegaly  Chest - clear to auscultation, no wheezes, rales or rhonchi, symmetric air entry  Heart - normal rate, regular rhythm, normal S1, S2, no murmurs, rubs, clicks or gallops  Abdomen - soft, nontender, nondistended, no masses or organomegaly, bowel sounds present  Back exam - full range of motion, no tenderness, palpable spasm or pain on motion  Neurological - normal speech, no focal findings or movement disorder noted  Musculoskeletal - no joint tenderness, deformity or swelling  Extremities - peripheral pulses normal, no pedal edema, no clubbing or cyanosis  Skin - normal coloration and turgor, no rashes, no suspicious skin lesions noted    LABS  Lab Results   Component Value Date/Time     WBC 6.7 04/27/2016 03:13 AM    HGB 12.4 04/27/2016 03:13 AM    HCT 34.4 04/27/2016 03:13 AM    PLATELET 246 04/27/2016 03:13 AM    MCV 84.5 04/27/2016 03:13 AM    ABS. NEUTROPHILS 3.4 04/27/2016 03:13 AM     Lab Results   Component Value Date/Time    Sodium 128 04/30/2016 03:46 AM    Potassium 3.9 04/30/2016 03:46 AM    Chloride 95 04/30/2016 03:46 AM    CO2 21 04/30/2016 03:46 AM    Glucose 80 04/30/2016 03:46 AM    BUN 7 04/30/2016 03:46 AM    Creatinine 0.37 04/30/2016 03:46 AM    GFR est AA >60 04/30/2016 03:46 AM    GFR est non-AA >60 04/30/2016 03:46 AM    Calcium 9.3 04/30/2016 03:46 AM     Lab Results   Component Value Date/Time    AST (SGOT) 58 04/24/2016 04:19 AM    Alk. phosphatase 99 04/24/2016 04:19 AM    Protein, total 6.6 04/24/2016 04:19 AM    Albumin 2.9 04/24/2016 04:19 AM    Globulin 3.7 04/24/2016 04:19 AM    A-G Ratio 0.8 04/24/2016 04:19 AM     IMAGING  PET 04/28/2016  IMPRESSION:   1. There is supraclavicular adenopathy left greater than right with increased  metabolic activity. There is retroperitoneal and bilateral iliac adenopathy with  increased metabolic activity and findings are suspicious for metastatic disease.  There is also mediastinal and hilar adenopathy which demonstrates only slight  increased metabolic activity could be metastatic or possibly reactive.  2. There is left hydroureteronephrosis with a left ureter dilated to mid pelvis  suspicious for obstruction.  3. There is bony metastatic disease. There is soft tissue in the paravertebral  region adjacent to C7 suspicious for soft tissue extension.   4. Prostate gland is enlarged and there is increased block activity in the  prostate gland anteriorly which could be related to primary neoplasm or possibly  prostatitis  5. There is a right lower lobe pulmonary mass with increased metabolic activity  of 4.2 which could represent a primary or metastatic lesion.. There are multiple   other masses which are smaller which are demonstrating SUV of 2 or less may  represent metastatic lesions but less metabolic activity related to small size.  There is interstitial prominence and peribronchial thickening nonspecific but  could represent lymphangitic spread.     MRI Brain 04/23/16 - Small  acute lacunar infarct in the superior posterior right frontal lobe.     CTA 04/23/16  IMPRESSION:   1. No pulmonary embolus.  2. Bilateral lung nodules.  3. Mediastinal and bilateral hilar adenopathy.  4. Peribronchial disease and interstitial lung disease.  5. Atherosclerotic aorta with coronary artery calcification.  6. Persistent left nephrogram with hydronephrosis.    MRI spine 04/29/16    1. Numerous metastatic lesions seen in the visualized portions of the thoracic  spine and in the lumbar spine, as well as in the pelvis. Lesion of the T12 level  minimally encroaches epidural space of the left lateral recess without causing  significant spinal canal stenosis. Lesion on the right at the S1-S2 level may  also encroach minimally on the neural foramen.  2. Mild multilevel degenerative disc disease and degenerative changes.   3. Extensive retroperitoneal lymphadenopathy noted. Left-sided hydronephrosis  likely caused by lymphadenopathy.    Supraclavicular node   CYTOLOGIC INTERPRETATION:   Adenocarcinoma, consistent with prostate primary   See comment   General Categorization   Positive for malignancy.   Specimen Adequacy   Satisfactory for evaluation.   Comment   Touch preps and a core biopsy are examined and show islands of adenocarcinoma surrounded by fibrous stroma. A panel of immunohistochemical stains was performed to evaluate site of origin. The tumor cells are focally positive for PSA and PSAP and negative for CK7, CK20, TTF-1, CDX-2 and Napsin A. Morphology and immunoprofile are most consistent with a metastatic prostatic adenocarcinoma.    ASSESSMENT   Mr. Skalicky is a 65 y.o. male with newly diagnosed widely metastatic prostate adenocarcinoma who presented with, cancer associated DVT and  Acute lacunar infarct.     PLAN    Prostate cancer  Newly diagnosed, castrate sensitive, treatment na??ve widely metastatic prostate cancer to lymph nodes above and below the diaphragm and bones.  Prostate adenocarcinoma diagnosed on biopsy of the supraclavicular lymph node.  Reviewed scans with the patient and his friend today.  They understand the disease is unfortunately incurable.  We discussed palliative treatments with the goal of controlling continued spread of disease, improving symptoms, prolonging life.  The mainstay of palliative treatment for metastatic castrate sensitive disease is androgen deprivation therapy.  With his high bone disease we also reviewed up front docetaxel versus, after Abiraterone in light of improved disease control and overall survival data from multiple large phase 3 trials (CHAARTED, STAMPEDE, LATTITUDE). Abiraterone and Docetaxel have not been compared head to head.  With his frailty, he might tolerate Zytiga better    - PSA   - Firmagon to be scheduled asap , then Lupron 45 mg every 6 months   - See below, after bone metastasis addressed by Rad Onc will start Zytiga      Bone metastases  Diffuse  Largely asymptomatic but concerning locations   There is soft tissue in the paravertebral  region adjacent to C7 suspicious for soft tissue extension on CT  Have discussed with Dr. 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  L hydronephrosis   Noted on CT secondary to enlarged prostate  Will likely need a stent, has been evaluated by Dr. Meda Coffee who will address this outpatient    RTC 1 week after scans and seeing Rad Onc  Discussed in detail with his friend   Does not have any next of kin  Will review DPOA next visit      Juliene Pina, MD    Mr. Knecht has a reminder for a "due or due soon" health maintenance. I have asked that he contact his primary care provider for follow-up on this health maintenance.

## 2016-05-07 NOTE — Progress Notes (Signed)
Blood pressure 107/69, pulse 68, temperature 97.2 ??F (36.2 ??C), resp. rate 16.    Patient came in for a peripheral lab draw. Labs were drawn from the left and right antecubitals.  Labs drawn were a PSA, CMP and a CBC w/Diff ordered by Dr. Juliene Pina.

## 2016-05-07 NOTE — Progress Notes (Signed)
Bruce Clark is a 65 y.o. male here today as a new patient.

## 2016-05-08 ENCOUNTER — Encounter: Payer: Charity | Primary: Internal Medicine

## 2016-05-08 ENCOUNTER — Inpatient Hospital Stay: Admit: 2016-05-08 | Primary: Internal Medicine

## 2016-05-08 NOTE — Progress Notes (Signed)
ONCOLOGY NURSE NAVIGATOR    Confirmed with [facility] that Bruce Clark has been screened and is eligible for Access Now and SW will direct to other resources based on eligibility, e.g., Medicaid    [facility] will schedule appt with Dr. Janeth Rase to establish as PCP    [name] MS RN AOCNS

## 2016-05-09 DIAGNOSIS — C61 Malignant neoplasm of prostate: Secondary | ICD-10-CM

## 2016-05-09 NOTE — Progress Notes (Signed)
1633 Call from lab personnel, T. Sanford, not enough sample for PSA, will have to cancel

## 2016-05-11 ENCOUNTER — Inpatient Hospital Stay: Admit: 2016-05-11 | Payer: Charity | Attending: Internal Medicine | Primary: Internal Medicine

## 2016-05-11 DIAGNOSIS — C7951 Secondary malignant neoplasm of bone: Secondary | ICD-10-CM

## 2016-05-11 DIAGNOSIS — C61 Malignant neoplasm of prostate: Secondary | ICD-10-CM

## 2016-05-11 MED ORDER — GADOTERIDOL 279.3 MG/ML INTRAVENOUS SOLUTION
279.3 mg/mL | Freq: Once | INTRAVENOUS | Status: AC
Start: 2016-05-11 — End: 2016-05-11
  Administered 2016-05-11: 19:00:00 via INTRAVENOUS

## 2016-05-11 MED FILL — PROHANCE 279.3 MG/ML INTRAVENOUS SOLUTION: 279.3 mg/mL | INTRAVENOUS | Qty: 10

## 2016-05-12 NOTE — Telephone Encounter (Signed)
Call placed to patient, spoke with representative [name], no PHI disclosed. Informed joyce she needs to be listed on HIPAA form in office, states patient was seen by Dr. Leron Croak on 9/21. They have follow up with him on 9/27. Informed her that Dr. Melony Overly would like to follow up either the end of this week or early next week. Transferred to front desk to coordinate follow up appointment. They will bring in financial documentation for Eliquis financial assistance application to follow up appointment. They have first month supply of medication at home from hospital.

## 2016-05-14 MED ORDER — DEGARELIX 120 MG SUB-Q SOLN
120 mg | Freq: Once | SUBCUTANEOUS | Status: AC
Start: 2016-05-14 — End: 2016-05-16

## 2016-05-14 MED FILL — FIRMAGON KIT WITH DILUENT SYRINGE 120 MG SUBCUTANEOUS SOLUTION: 120 mg | SUBCUTANEOUS | Qty: 240

## 2016-05-15 ENCOUNTER — Encounter

## 2016-05-16 ENCOUNTER — Inpatient Hospital Stay: Payer: Charity | Primary: Internal Medicine

## 2016-05-21 ENCOUNTER — Encounter: Attending: Family | Primary: Internal Medicine

## 2016-05-21 ENCOUNTER — Encounter: Payer: Charity | Primary: Internal Medicine

## 2016-05-22 MED ORDER — DEGARELIX 120 MG SUB-Q SOLN
120 mg | Freq: Once | SUBCUTANEOUS | Status: DC
Start: 2016-05-22 — End: 2016-05-26

## 2016-05-26 ENCOUNTER — Inpatient Hospital Stay: Admit: 2016-05-26 | Payer: Charity | Primary: Internal Medicine

## 2016-05-26 DIAGNOSIS — C61 Malignant neoplasm of prostate: Secondary | ICD-10-CM

## 2016-05-26 MED ORDER — DEGARELIX 120 MG SUB-Q SOLN
120 mg | Freq: Once | SUBCUTANEOUS | Status: AC
Start: 2016-05-26 — End: 2016-05-26
  Administered 2016-05-26: 19:00:00 via SUBCUTANEOUS

## 2016-05-26 MED FILL — FIRMAGON KIT WITH DILUENT SYRINGE 120 MG SUBCUTANEOUS SOLUTION: 120 mg | SUBCUTANEOUS | Qty: 120

## 2016-05-26 MED FILL — FIRMAGON 120 MG SUBCUTANEOUS SOLUTION: 120 mg | SUBCUTANEOUS | Qty: 240

## 2016-05-26 NOTE — Progress Notes (Signed)
Problem: Patient Education: Go to Education Activity  Goal: Patient/Family Education  Outcome: Progressing Towards Goal  Degarelix

## 2016-05-26 NOTE — Progress Notes (Addendum)
Outpatient Infusion Center Short Visit Progress Note    1478 Pt admit to [facility] for Degarelix ambulatory in stable condition. Assessment completed. Pt states he has no pain at this time but has been having intermittent left sided chest pain over the past three days. Pt describes the pain as aching, usually a 4/10, and lasting about 5-10 minutes. Pt's vital signs are stable. [name] NP made aware. Advised pt and supportive friend that if pain worsens or does not go away, to go to ER. Lab unable to process PSA at last appointment, redrawn today and sent for processing. Please review pending lab results in [place].    Patient Vitals for the past 12 hrs:   Temp Pulse Resp BP   05/26/16 1323 98.7 ??F (37.1 ??C) 72 16 96/67     Medications:  Degarelix SC abdomen    1440 Pt tolerated treatment well. Pt and friend declined observation period following injection, education on medication and signs/symptoms of reaction provided. D/c home ambulatory in no distress. Pt aware of next appointment scheduled for 06/23/16.

## 2016-05-27 LAB — PSA, DIAGNOSTIC (PROSTATE SPECIFIC AG): Prostate Specific Ag: 2402.7 ng/mL — ABNORMAL HIGH (ref 0.0–4.0)

## 2016-05-27 MED ORDER — APIXABAN 5 MG TABLET
5 mg | ORAL_TABLET | Freq: Two times a day (BID) | ORAL | 6 refills | Status: DC
Start: 2016-05-27 — End: 2016-12-29

## 2016-05-27 NOTE — Telephone Encounter (Signed)
Call placed to patient representative to advise that Eliquis patient assistance application unable to be processed at this time, will need to come in and have new application signed. No PHI disclosed on message.

## 2016-05-27 NOTE — Telephone Encounter (Signed)
Received return call from Mrs Christoper Fabian, No PHI disclosed, she will assist with bringing patient in to office tomorrow morning to sign new application for Eliquis assistance. Per [name] they have many transportation barriers for appointments and treatment. She will have him in office in the morning. Thanked for call.

## 2016-06-05 NOTE — Telephone Encounter (Signed)
Call placed to patient representative, message left to return call. Patient has been approved for Eliquis through [employer] through 06/04/17. Will need to call [employer] to schedule delivery through [employer], number provided on identified voicemail, encouraged to call with any questions.

## 2016-06-10 NOTE — Telephone Encounter (Signed)
ONCOLOGY NURSE NAVIGATOR    Rec'ed VM on 10/20 from [name] requesting return call.  I was off work 10/20-23.     Returned call today, 10/24 -- left VM     [name] MS RN AOCNS

## 2016-06-10 NOTE — Telephone Encounter (Signed)
ONCOLOGY NURSE NAVIGATOR    Returned call to [name]  -- [name] has transportation barriers to RT at [employer].  RT started 10/23.  Ends 11/3.  Appt time 830am.  Grandson can drive [name] to RT but will be late to work if he drives him home, too -- he must be at work by [employer].     Because of childcare responsibilities -- [name] cares for autistic grandson 11a through weekday afternoons -- [facility] cannot accept an afternoon treatment appt.    Today they called [name] for ride home.  Cost $11, one way.  Can use [name] as backup but daily use would be cost prohibitive.    We discussed and agreed on the following plan:    1.  [name] to call [facility] today.    I will fax referral to [facility] today.  It's unlikely [facility] can respond to urgent need without more notice but we agreed to try.    2.  I'll phone Rad Onc to see if treatment time can be scheduled earlier in the morning.       3.  Confirmed appt on 10/26 at 3pm with [name], NP/Dr [name].        [name] MS RN AOCNS    Addendum:    Called Rad Onc and spoke with therapist, [name].  Rad Onc can treat [name] tomorrow at 810am.  They will make schedule changes on subsequent days when possible and will discuss all with [name] and [name] tomorrow.  Called [name] to advise.  Directed [name] to arrive as early as possible -- he will always be treated early if they are able.

## 2016-06-12 ENCOUNTER — Ambulatory Visit: Admit: 2016-06-12 | Discharge: 2016-06-12 | Attending: Family | Primary: Internal Medicine

## 2016-06-12 ENCOUNTER — Encounter: Payer: Self-pay | Primary: Internal Medicine

## 2016-06-12 DIAGNOSIS — C7951 Secondary malignant neoplasm of bone: Secondary | ICD-10-CM

## 2016-06-12 NOTE — Progress Notes (Signed)
Hematology/Oncology Progress Note    REASON FOR VISIT: Metastatic castrate sensitive prostate cancer        HISTORY OF PRESENT ILLNESS: Bruce Clark is a 65 y.o. male who is on Eliquis for a h/o DVT who presented to the Emergency Department on 04/22/16 with nausea, vomiting, and abdominal pain x 2 months, decreased appetite and weight loss of 10-14 lbs.  CTA revealed bilateral lung nodules and mediastinal/hilar adenopathy. PET revealed supraclavicular adenopathy, retroperitoneal adenopathy, bilateral iliac adenopathy, mediastinal/hilar adenopathy, bony metastatic disease, right lower lobe pulmonary mass, and multiple other smaller masses in bilateral lungs. USG guided bx of supraclavicular LN showed adenocarcinoma of the prostate. He was also diagnosed and treated for CAP. PSA on 05/26/16-2400. Was discussed in tumor board, Napsin A negative and a small cell component ruled out. Initiated Degarelix  05/26/16. MRI spine with expansile C7 lesion, T12 lesion encroaching epidural space, S1-S2 lesion encroaching neural foramina. Started palliative C6-T5 and T11-L5 XRT under Dr. Leron Croak on 06/09/16    Family is estranged, comes with a friend, he lives with her- [name].    Patient presents for follow up. States he is feeling well overall. No longer having pain in neck since starting radiation. Started treatment on Monday this week. Planning 10 treatments total. Appetite has been great. Some nausea and vomiting on the first day of radiation, but this has since resolved. No hematuria, no leg weakness, no falls.     No past medical history on file.  ollnbd  Past Surgical History:   Procedure Laterality Date   ??? HX HERNIA REPAIR         No Known Allergies    Current Outpatient Prescriptions   Medication Sig Dispense Refill   ??? tamsulosin (FLOMAX) 0.4 mg capsule Take 0.4 mg by mouth daily.     ??? apixaban (ELIQUIS) 5 mg tablet Take 1 Tab by mouth two (2) times a day. Indications: deep venous thrombosis 180 Tab 6    ??? ondansetron hcl (ZOFRAN, AS HYDROCHLORIDE,) 8 mg tablet Take 1 Tab by mouth every eight (8) hours as needed for Nausea. Indications: PREVENTION OF CHEMOTHERAPY-INDUCED NAUSEA AND VOMITING 30 Tab 0   ??? pantoprazole (PROTONIX) 40 mg tablet Take 1 Tab by mouth daily. 60 Tab 6   ??? dexamethasone (DECADRON) 2 mg tablet Take 1 Tab by mouth two (2) times daily (with meals). 10 Tab 0   ??? aspirin 81 mg chewable tablet Take 1 Tab by mouth daily. 30 Tab 0   ??? atorvastatin (LIPITOR) 20 mg tablet Take 1 Tab by mouth daily. 30 Tab 0   ??? magnesium oxide (MAG-OX) 400 mg tablet Take 1 Tab by mouth two (2) times a day. 14 Tab 0       Social History     Social History   ??? Marital status: SINGLE     Spouse name: N/A   ??? Number of children: N/A   ??? Years of education: N/A     Social History Main Topics   ??? Smoking status: Former Smoker   ??? Smokeless tobacco: Never Used   ??? Alcohol use No   ??? Drug use: None   ??? Sexual activity: Not Asked     Other Topics Concern   ??? None     Social History Narrative       No family history on file.    ROS  A 12 point review of systems was obtained and is negative except as listed in HPI.  ECOG PS  is 1    Physical Examination:   Visit Vitals   ??? BP 92/54   ??? Pulse (!) 55   ??? Temp 98.7 ??F (37.1 ??C) (Oral)   ??? Resp 16   ??? Ht 5' 6"  (1.676 m)   ??? Wt 127 lb (57.6 kg)   ??? BMI 20.5 kg/m2     General appearance - alert, frail appearing, and in no distress  Poor historian   Mental status - oriented to person, place, and time  Mouth - mucous membranes moist, pharynx normal without lesions  Neck - supple, no significant adenopathy  Lymphatics - no palpable lymphadenopathy, no hepatosplenomegaly  Chest - clear to auscultation, no wheezes, rales or rhonchi, symmetric air entry  Heart - normal rate, regular rhythm, normal S1, S2, no murmurs, rubs, clicks or gallops  Abdomen - soft, nontender, nondistended, no masses or organomegaly, bowel sounds present   Back exam - full range of motion, no tenderness, palpable spasm or pain on motion  Neurological - normal speech, no focal findings or movement disorder noted  Musculoskeletal - no joint tenderness, deformity or swelling  Extremities - peripheral pulses normal, no pedal edema, no clubbing or cyanosis  Skin - normal coloration and turgor, no rashes, no suspicious skin lesions noted    LABS  Lab Results   Component Value Date/Time    WBC 6.4 05/07/2016 12:04 PM    HGB 13.8 05/07/2016 12:04 PM    HCT 40.6 05/07/2016 12:04 PM    PLATELET 390 05/07/2016 12:04 PM    MCV 88.1 05/07/2016 12:04 PM    ABS. NEUTROPHILS 3.4 05/07/2016 12:04 PM     Lab Results   Component Value Date/Time    Sodium 133 05/07/2016 12:04 PM    Potassium 4.2 05/07/2016 12:04 PM    Chloride 101 05/07/2016 12:04 PM    CO2 23 05/07/2016 12:04 PM    Glucose 74 05/07/2016 12:04 PM    BUN 12 05/07/2016 12:04 PM    Creatinine 0.51 05/07/2016 12:04 PM    GFR est AA >60 05/07/2016 12:04 PM    GFR est non-AA >60 05/07/2016 12:04 PM    Calcium 10.3 05/07/2016 12:04 PM     Lab Results   Component Value Date/Time    AST (SGOT) 28 05/07/2016 12:04 PM    Alk. phosphatase 128 05/07/2016 12:04 PM    Protein, total 8.4 05/07/2016 12:04 PM    Albumin 3.1 05/07/2016 12:04 PM    Globulin 5.3 05/07/2016 12:04 PM    A-G Ratio 0.6 05/07/2016 12:04 PM     IMAGING  PET 04/28/2016  IMPRESSION:   1. There is supraclavicular adenopathy left greater than right with increased  metabolic activity. There is retroperitoneal and bilateral iliac adenopathy with  increased metabolic activity and findings are suspicious for metastatic disease.  There is also mediastinal and hilar adenopathy which demonstrates only slight  increased metabolic activity could be metastatic or possibly reactive.  2. There is left hydroureteronephrosis with a left ureter dilated to mid pelvis  suspicious for obstruction.  3. There is bony metastatic disease. There is soft tissue in the paravertebral   region adjacent to C7 suspicious for soft tissue extension.   4. Prostate gland is enlarged and there is increased block activity in the  prostate gland anteriorly which could be related to primary neoplasm or possibly  prostatitis  5. There is a right lower lobe pulmonary mass with increased metabolic activity  of 4.2 which could represent a primary  or metastatic lesion.. There are multiple  other masses which are smaller which are demonstrating SUV of 2 or less may  represent metastatic lesions but less metabolic activity related to small size.  There is interstitial prominence and peribronchial thickening nonspecific but  could represent lymphangitic spread.     MRI Brain 04/23/16 - Small acute lacunar infarct in the superior posterior right frontal lobe.     CTA 04/23/16  IMPRESSION:   1. No pulmonary embolus.  2. Bilateral lung nodules.  3. Mediastinal and bilateral hilar adenopathy.  4. Peribronchial disease and interstitial lung disease.  5. Atherosclerotic aorta with coronary artery calcification.  6. Persistent left nephrogram with hydronephrosis.    MRI spine 04/29/16    1. Numerous metastatic lesions seen in the visualized portions of the thoracic  spine and in the lumbar spine, as well as in the pelvis. Lesion of the T12 level  minimally encroaches epidural space of the left lateral recess without causing  significant spinal canal stenosis. Lesion on the right at the S1-S2 level may  also encroach minimally on the neural foramen.  2. Mild multilevel degenerative disc disease and degenerative changes.   3. Extensive retroperitoneal lymphadenopathy noted. Left-sided hydronephrosis  likely caused by lymphadenopathy.    Supraclavicular node   CYTOLOGIC INTERPRETATION:   Adenocarcinoma, consistent with prostate primary   See comment   General Categorization   Positive for malignancy.   Specimen Adequacy   Satisfactory for evaluation.   Comment   Touch preps and a core biopsy are examined and show islands of  adenocarcinoma surrounded by fibrous stroma. A panel of immunohistochemical stains was performed to evaluate site of origin. The tumor cells are focally positive for PSA and PSAP and negative for CK7, CK20, TTF-1, CDX-2 and Napsin A. Morphology and immunoprofile are most consistent with a metastatic prostatic adenocarcinoma.    ASSESSMENT  Mr. Appenzeller is a 65 y.o. male with newly diagnosed widely metastatic prostate adenocarcinoma who presented with, cancer associated DVT and  Acute lacunar infarct.     PLAN    Prostate cancer  Newly diagnosed, castrate sensitive, treatment na??ve widely metastatic prostate cancer to lymph nodes above and below the diaphragm and bones.  Prostate adenocarcinoma diagnosed on biopsy of the supraclavicular lymph node.  Discussed in tumor board and a small cell component has been ruled out.  PSA at the time of diagnosis at 2400  Reviewed scans with the patient and his friend today.  They understand the disease is unfortunately incurable.  We discussed palliative treatments with the goal of controlling continued spread of disease, improving symptoms, prolonging life.  The mainstay of palliative treatment for metastatic castrate sensitive disease is androgen deprivation therapy.  With his high bone disease we also reviewed up front docetaxel versus  Abiraterone in light of improved disease control and overall survival data from multiple large phase 3 trials (CHAARTED, STAMPEDE, LATTITUDE). Abiraterone and Docetaxel have not been compared head to head.  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     Acute lacunar infarct   MRI brain reviewed previous visit  Continue Eliquis.    Pain  Resolved since initiation of radiation.     Follow up week of 06/23/16.     Patient was seen with [name] NP      Juliene Pina, MD

## 2016-06-12 NOTE — Progress Notes (Signed)
Bruce Clark is a 65 y.o. male here today for prostate cancer.  He has brought in bottles of his medication today, many of them are empty.  States he ran out of pills about one week ago.  Goes to [facility]. BP 92/54, p-55.

## 2016-06-17 MED ORDER — LEUPROLIDE (6 MONTH) 45 MG IM SYRINGE KIT
45 mg | Freq: Once | INTRAMUSCULAR | Status: AC
Start: 2016-06-17 — End: 2016-06-23
  Administered 2016-06-23: 18:00:00 via INTRAMUSCULAR

## 2016-06-18 ENCOUNTER — Encounter: Payer: Charity | Primary: Internal Medicine

## 2016-06-23 ENCOUNTER — Inpatient Hospital Stay: Admit: 2016-06-23 | Payer: Charity | Primary: Internal Medicine

## 2016-06-23 DIAGNOSIS — C61 Malignant neoplasm of prostate: Secondary | ICD-10-CM

## 2016-06-23 MED FILL — LUPRON DEPOT 45 MG (6 MONTH) INTRAMUSCULAR SYRINGE KIT: 45 mg | INTRAMUSCULAR | Qty: 1

## 2016-06-23 NOTE — Progress Notes (Signed)
Outpatient Infusion Center Short Visit Progress Note    1300 Pt admit to [facility] for Lupron ambulatory in stable condition. Assessment completed. Pt states he has had a little bit of a sore throat since Saturday, but he is able to eat and drink fine. Pt's vital signs are stable. Labs were drawn last visit and are not needed today.   Patient Vitals for the past 12 hrs:   Temp Pulse Resp BP   06/23/16 1309 98.6 ??F (37 ??C) 72 18 (!) 89/61     Medications:  Lupron IM in R side    1330 Pt tolerated treatment well. Pt and friend declined observation period following injection, education on medication and signs/symptoms of reaction provided. D/c home ambulatory in no distress. Pt need to follow up with MD for more appointments.

## 2016-06-26 ENCOUNTER — Ambulatory Visit: Admit: 2016-06-26 | Discharge: 2016-06-26 | Attending: Family | Primary: Internal Medicine

## 2016-06-26 DIAGNOSIS — C61 Malignant neoplasm of prostate: Secondary | ICD-10-CM

## 2016-06-26 MED ORDER — MAGIC MOUTHWASH AMBULATORY SIMPLE MEDICATION
ORAL | 0 refills | Status: DC
Start: 2016-06-26 — End: 2016-09-26

## 2016-06-26 MED ORDER — PREDNISONE 5 MG TAB
5 mg | ORAL_TABLET | Freq: Every day | ORAL | 6 refills | Status: DC
Start: 2016-06-26 — End: 2016-07-25

## 2016-06-26 MED ORDER — ABIRATERONE 500 MG TABLET
500 mg | ORAL_CAPSULE | Freq: Every day | ORAL | 6 refills | Status: DC
Start: 2016-06-26 — End: 2017-08-26

## 2016-06-26 MED ORDER — AMOXICILLIN CLAVULANATE 875 MG-125 MG TAB
875-125 mg | ORAL_TABLET | Freq: Two times a day (BID) | ORAL | 0 refills | Status: AC
Start: 2016-06-26 — End: 2016-07-03

## 2016-06-26 NOTE — Progress Notes (Signed)
Had the opportunity to meet with patient and his friend Holley Bouche [phone] to discuss zytiga and the role of specialty pharmacy.  Chemo care written information provided and reviewed.  Discussed administration indicating taking on an empty stomach the same day each day.  Prednisone will also be prescribed per protocol.  Possible side effects including fluid retention.  R Lower extremity chronic swelling exists.  Labs to be checked routinely.  The importance of urination and fluid intake discussed.  Fever, abnormal swelling to be reported.  Pt verbalized understanding to plan.      He does not have insurance.  He has provided social security information and bank statements for processing if needed.  Encouraged to call with any additional questions/concerns once information reviewed.    Hydration / labs to be requested to proficient scheduling.

## 2016-06-26 NOTE — Progress Notes (Signed)
Hematology/Oncology Progress Note    REASON FOR VISIT: Metastatic castrate sensitive prostate cancer    HISTORY OF PRESENT ILLNESS: Bruce Clark is a 65 y.o. male who is on Eliquis for a h/o DVT who presented to the Emergency Department on 04/22/16 with nausea, vomiting, and abdominal pain x 2 months, decreased appetite and weight loss of 10-14 lbs.  CTA revealed bilateral lung nodules and mediastinal/hilar adenopathy. PET revealed supraclavicular adenopathy, retroperitoneal adenopathy, bilateral iliac adenopathy, mediastinal/hilar adenopathy, bony metastatic disease, right lower lobe pulmonary mass, and multiple other smaller masses in bilateral lungs. USG guided bx of supraclavicular LN showed adenocarcinoma of the prostate. He was also diagnosed and treated for CAP. PSA on 05/26/16-2400. Was discussed in tumor board, Napsin A negative and a small cell component ruled out. Initiated Degarelix  05/26/16. MRI spine with expansile C7 lesion, T12 lesion encroaching epidural space, S1-S2 lesion encroaching neural foramina. Started palliative C6-T5 and T11-L5 XRT under Dr. Leron Croak on 06/09/16    Family is estranged, comes with a friend, he lives with her- Bruce Clark.    Patient presents for follow up. Radiation finished on Friday. On Saturday he started having difficulty swallowing. States he can swallow, it just hurts very bad. Hurts in back of throat. Intake of food and fluids has been down. Able to drink milk and water, but small amounts. Little to no solid foods in the last 2 days. Ate some greens on Sunday. It was his birthday and wasn't able to eat much. Energy is down, resting during the day as needed. No new aches or pains other than sore throat. No sores in mouth. No nausea or vomiting. No issue with bowels. No bleeding noted. Taking Eliquis twice daily as prescribed. No fever or chills in the home. Denies cough, congestion, shortness of breath or chest pain.     No past medical history on file.  ollnbd   Past Surgical History:   Procedure Laterality Date   ??? HX HERNIA REPAIR         No Known Allergies    Current Outpatient Prescriptions   Medication Sig Dispense Refill   ??? apixaban (ELIQUIS) 5 mg tablet Take 1 Tab by mouth two (2) times a day. Indications: deep venous thrombosis 180 Tab 6   ??? ondansetron hcl (ZOFRAN, AS HYDROCHLORIDE,) 8 mg tablet Take 1 Tab by mouth every eight (8) hours as needed for Nausea. Indications: PREVENTION OF CHEMOTHERAPY-INDUCED NAUSEA AND VOMITING 30 Tab 0   ??? pantoprazole (PROTONIX) 40 mg tablet Take 1 Tab by mouth daily. 60 Tab 6   ??? atorvastatin (LIPITOR) 20 mg tablet Take 1 Tab by mouth daily. 30 Tab 0       Social History     Social History   ??? Marital status: SINGLE     Spouse name: N/A   ??? Number of children: N/A   ??? Years of education: N/A     Social History Main Topics   ??? Smoking status: Former Smoker   ??? Smokeless tobacco: Never Used   ??? Alcohol use No   ??? Drug use: None   ??? Sexual activity: Not Asked     Other Topics Concern   ??? None     Social History Narrative       No family history on file.    ROS  A 12 point review of systems was obtained and is negative except as listed in HPI.  ECOG PS is 1    Physical Examination:   Visit  Vitals   ??? BP (!) 85/58   ??? Pulse 72   ??? Temp 98.8 ??F (37.1 ??C)   ??? Resp 20   ??? Ht 5' 6"  (1.676 m)   ??? Wt 126 lb (57.2 kg)   ??? SpO2 98%   ??? BMI 20.34 kg/m2     General appearance - alert, frail appearing, and in no distress  Poor historian   Mental status - oriented to person, place, and time  Mouth - mucous membranes moist  Neck - supple, no significant adenopathy  Chest - clear to auscultation, no wheezes, rales or rhonchi, symmetric air entry  Heart - normal rate, regular rhythm, normal S1, S2, no murmurs, rubs, clicks or gallops  Abdomen - soft, nontender, nondistended, no masses or organomegaly, bowel sounds present  Neurological - normal speech, no focal findings or movement disorder noted   Musculoskeletal - no joint tenderness, deformity or swelling  Extremities - peripheral pulses normal, no pedal edema, no clubbing or cyanosis  Skin - normal coloration and turgor, no rashes, no suspicious skin lesions noted    LABS  Lab Results   Component Value Date/Time    WBC 6.4 05/07/2016 12:04 PM    HGB 13.8 05/07/2016 12:04 PM    HCT 40.6 05/07/2016 12:04 PM    PLATELET 390 05/07/2016 12:04 PM    MCV 88.1 05/07/2016 12:04 PM    ABS. NEUTROPHILS 3.4 05/07/2016 12:04 PM     Lab Results   Component Value Date/Time    Sodium 133 05/07/2016 12:04 PM    Potassium 4.2 05/07/2016 12:04 PM    Chloride 101 05/07/2016 12:04 PM    CO2 23 05/07/2016 12:04 PM    Glucose 74 05/07/2016 12:04 PM    BUN 12 05/07/2016 12:04 PM    Creatinine 0.51 05/07/2016 12:04 PM    GFR est AA >60 05/07/2016 12:04 PM    GFR est non-AA >60 05/07/2016 12:04 PM    Calcium 10.3 05/07/2016 12:04 PM     Lab Results   Component Value Date/Time    AST (SGOT) 28 05/07/2016 12:04 PM    Alk. phosphatase 128 05/07/2016 12:04 PM    Protein, total 8.4 05/07/2016 12:04 PM    Albumin 3.1 05/07/2016 12:04 PM    Globulin 5.3 05/07/2016 12:04 PM    A-G Ratio 0.6 05/07/2016 12:04 PM     IMAGING  PET 04/28/2016  IMPRESSION:   1. There is supraclavicular adenopathy left greater than right with increased  metabolic activity. There is retroperitoneal and bilateral iliac adenopathy with  increased metabolic activity and findings are suspicious for metastatic disease.  There is also mediastinal and hilar adenopathy which demonstrates only slight  increased metabolic activity could be metastatic or possibly reactive.  2. There is left hydroureteronephrosis with a left ureter dilated to mid pelvis  suspicious for obstruction.  3. There is bony metastatic disease. There is soft tissue in the paravertebral  region adjacent to C7 suspicious for soft tissue extension.   4. Prostate gland is enlarged and there is increased block activity in the   prostate gland anteriorly which could be related to primary neoplasm or possibly  prostatitis  5. There is a right lower lobe pulmonary mass with increased metabolic activity  of 4.2 which could represent a primary or metastatic lesion.. There are multiple  other masses which are smaller which are demonstrating SUV of 2 or less may  represent metastatic lesions but less metabolic activity related to small size.  There is interstitial prominence and peribronchial thickening nonspecific but  could represent lymphangitic spread.     MRI Brain 04/23/16 - Small acute lacunar infarct in the superior posterior right frontal lobe.     CTA 04/23/16  IMPRESSION:   1. No pulmonary embolus.  2. Bilateral lung nodules.  3. Mediastinal and bilateral hilar adenopathy.  4. Peribronchial disease and interstitial lung disease.  5. Atherosclerotic aorta with coronary artery calcification.  6. Persistent left nephrogram with hydronephrosis.    MRI spine 04/29/16    1. Numerous metastatic lesions seen in the visualized portions of the thoracic  spine and in the lumbar spine, as well as in the pelvis. Lesion of the T12 level  minimally encroaches epidural space of the left lateral recess without causing  significant spinal canal stenosis. Lesion on the right at the S1-S2 level may  also encroach minimally on the neural foramen.  2. Mild multilevel degenerative disc disease and degenerative changes.   3. Extensive retroperitoneal lymphadenopathy noted. Left-sided hydronephrosis  likely caused by lymphadenopathy.    Supraclavicular node   CYTOLOGIC INTERPRETATION:   Adenocarcinoma, consistent with prostate primary   See comment   General Categorization   Positive for malignancy.   Specimen Adequacy   Satisfactory for evaluation.   Comment   Touch preps and a core biopsy are examined and show islands of adenocarcinoma surrounded by fibrous stroma. A panel of immunohistochemical stains was performed to evaluate site of origin. The  tumor cells are focally positive for PSA and PSAP and negative for CK7, CK20, TTF-1, CDX-2 and Napsin A. Morphology and immunoprofile are most consistent with a metastatic prostatic adenocarcinoma.    ASSESSMENT  Bruce Clark is a 65 y.o. male with newly diagnosed widely metastatic prostate adenocarcinoma who presented with, cancer associated DVT and  Acute lacunar infarct.     PLAN    Prostate cancer  Newly diagnosed, castrate sensitive, treatment na??ve widely metastatic prostate cancer to lymph nodes above and below the diaphragm and bones.  Prostate adenocarcinoma diagnosed on biopsy of the supraclavicular lymph node.  Discussed in tumor board and a small cell component has been ruled out.  PSA at the time of diagnosis at 2400  Reviewed scans with the patient and his friend today.  They understand the disease is unfortunately incurable.  We discussed palliative treatments with the goal of controlling continued spread of disease, improving symptoms, prolonging life.  The mainstay of palliative treatment for metastatic castrate sensitive disease is androgen deprivation therapy.  With his high bone disease we also reviewed up front docetaxel versus  Abiraterone in light of improved disease control and overall survival data from multiple large phase 3 trials (CHAARTED, STAMPEDE, LATTITUDE). Abiraterone and Docetaxel have not been compared head to head.    He received Firmagon on 05/26/16. Switch to Lupron 45 mg every 6 months, first dose on 06/23/16    Discussed options of Docetaxel and Abiraterone. He is uninterested in IV chemotherapy and may have difficulty traveling to and from appointments. Will move forward with Zytiga. Chemotherapy education today via Therapist, sports. Will need financial assistance for PO medications.     Prednisone 33m daily with Zytiga.     Bone metastases  Diffuse  Concerning lesions at C7, T12, S1, S2 which threaten the integrity of the spinal column   Under palliative radiation that was initiated on 10/23/7  Now off steroids  Hold on biphosphonates due to poor dental hygiene.     DVT  Diagnosed 04/22/16  Cancer  associated  Life long Eliquis in the absence of contraindications, continue Eliquis twice daily.     Tooth decay and mucositis  Magic Mouthwash for radiation induced esophagitis. Offered IV fluids as he appears dehydrated today, but is unable to stay due to family commitments.   Will schedule IV fluids and labs later this week.   Start antibiotic therapy due to concern for tooth decay with mucositis present.     Discussed with RN Navigator who will provide Magic Mouthwash and antibiotic prescription coverage for patient today.     Follow up in 4 weeks on Zytiga.     Patient was seen with [name] NP    Juliene Pina, MD

## 2016-06-26 NOTE — Progress Notes (Signed)
Bruce Clark is a 65 y.o. male here today for follow up, prostate cancer. Patient states complaints of sore throat, started on Saturday. Having difficulty swallowing.

## 2016-06-26 NOTE — Progress Notes (Signed)
ONCOLOGY NURSE NAVIGATOR    Brief supportive visit with Bruce Clark and his friend, Bruce Clark, following his appt today with [name], NP    He completed palliative C6-T5 and T11-L5XRT on 11/3.  Has sore throat with some difficulty swallowing.  However, his appetite and intake is good and overall, he's gained weight recently.  Bruce Clark describes [name] as eating small frequent meals and snacks.  He likes Boost supplements.    Overall, he states he feels pretty good and is pleased with his QOL and "how things are going."  ??  Discussed commencing palliative Zytiga/docetaxel.  Had chemo teaching today per Dr. Rosina Lowenstein RN, Pecolia Ades.  She will assist [name] to apply for FAP -- has rec'ed all financial docs to complete the application.    Uriel lives with Bruce Clark and her son and his wife and their child.  Bruce Clark identifies herself as caring for [name] and organizing his appts and helping his with all appts and treatments.  The both provide some after school care for [name]'s grandson, who is autistic.    [name]'s daughter in law expects 2nd child in Feb, 2018.  They are very excited as they anticipate the birth of this baby girl.    Financial barriers:  [name] is uninsured.  Bruce Clark and Landin has completed and mailed his care card application.  I confirmed with [facility] on 9/21  that [name] has been screened and is eligible for Access Now and SW will direct to other resources based on eligibility, e.g., Medicaid.  Desi has a follow up appt with provider at [facility] scheduled and will also meet with SW then.  Transportation was a frequent barrier to care.  [name] has used [employer] as well as assist from family for transportation to RT treatments.  Denied transportation barriers today.  Today he cannot afford OOP expenses for magic mouthwash, prednisone (to take with chemo) and augmentin.    ONN actions:  1.  Foundation can assist with FA for Rx today  2.  Gave [name] samples of supplements (ensure, boost, etc) to try   3.  Call to [employer] to confirm [name] is being directed to financial resources    [name] MS RN AOCNS

## 2016-06-27 NOTE — Progress Notes (Signed)
Zytiga processed to [employer] with appropriate documentation.  Bank statements and Social Security information available as needed.

## 2016-06-27 NOTE — Progress Notes (Signed)
Amended patient assist form per NP to be based on patient income only and 1 person dependent on this income.  Form faxed complete to [employer].  To [employer] for scanning.

## 2016-06-27 NOTE — Progress Notes (Signed)
Patient signed co=pay assit forms for zytiga.  Household size is 4 persons with total income of $110,000.  Forms to provider for signature.  Patient aware that may take week for processing.

## 2016-07-01 NOTE — Progress Notes (Signed)
ONCOLOGY NURSE NAVIGATOR    Referred case to Paulita Fujita, [employer] rep.  -- Requested screening for FA  He's applied for care card.    [name] MS RN AOCNS

## 2016-07-01 NOTE — Telephone Encounter (Signed)
error 

## 2016-07-02 ENCOUNTER — Inpatient Hospital Stay: Admit: 2016-07-02 | Payer: Charity | Primary: Internal Medicine

## 2016-07-02 LAB — LIPID PANEL
CHOL/HDL Ratio: 2.9 (ref 0–5.0)
Cholesterol, total: 184 MG/DL (ref <200)
HDL Cholesterol: 64 MG/DL
LDL, calculated: 109.2 MG/DL — ABNORMAL HIGH (ref 0–100)
Triglyceride: 54 MG/DL (ref <150)
VLDL, calculated: 10.8 MG/DL

## 2016-07-02 LAB — METABOLIC PANEL, COMPREHENSIVE
A-G Ratio: 1.1 (ref 1.1–2.2)
ALT (SGPT): 16 U/L (ref 12–78)
AST (SGOT): 18 U/L (ref 15–37)
Albumin: 3.8 g/dL (ref 3.5–5.0)
Alk. phosphatase: 117 U/L (ref 45–117)
Anion gap: 8 mmol/L (ref 5–15)
BUN/Creatinine ratio: 20 (ref 12–20)
BUN: 10 MG/DL (ref 6–20)
Bilirubin, total: 0.3 MG/DL (ref 0.2–1.0)
CO2: 27 mmol/L (ref 21–32)
Calcium: 9 MG/DL (ref 8.5–10.1)
Chloride: 103 mmol/L (ref 97–108)
Creatinine: 0.5 MG/DL — ABNORMAL LOW (ref 0.70–1.30)
GFR est AA: >60 mL/min/{1.73_m2} (ref >60)
GFR est non-AA: >60 mL/min/{1.73_m2} (ref >60)
Globulin: 3.6 g/dL (ref 2.0–4.0)
Glucose: 79 mg/dL (ref 65–100)
Potassium: 4.5 mmol/L (ref 3.5–5.1)
Protein, total: 7.4 g/dL (ref 6.4–8.2)
Sodium: 138 mmol/L (ref 136–145)

## 2016-07-02 LAB — CBC WITH AUTOMATED DIFF
ABS. BASOPHILS: 0 10*3/uL (ref 0.0–0.1)
ABS. EOSINOPHILS: 0.4 10*3/uL (ref 0.0–0.4)
ABS. LYMPHOCYTES: 0.9 10*3/uL (ref 0.8–3.5)
ABS. MONOCYTES: 0.5 10*3/uL (ref 0.0–1.0)
ABS. NEUTROPHILS: 1.6 10*3/uL — ABNORMAL LOW (ref 1.8–8.0)
BASOPHILS: 1 % (ref 0–1)
EOSINOPHILS: 10 % — ABNORMAL HIGH (ref 0–7)
HCT: 36.9 % (ref 36.6–50.3)
HGB: 12.7 g/dL (ref 12.1–17.0)
LYMPHOCYTES: 25 % (ref 12–49)
MCH: 29.7 PG (ref 26.0–34.0)
MCHC: 34.4 g/dL (ref 30.0–36.5)
MCV: 86.4 FL (ref 80.0–99.0)
MONOCYTES: 16 % — ABNORMAL HIGH (ref 5–13)
NEUTROPHILS: 48 % (ref 32–75)
PLATELET: 168 10*3/uL (ref 150–400)
RBC: 4.27 M/uL (ref 4.10–5.70)
RDW: 12.8 % (ref 11.5–14.5)
WBC: 3.4 10*3/uL — ABNORMAL LOW (ref 4.1–11.1)

## 2016-07-21 ENCOUNTER — Inpatient Hospital Stay: Payer: Charity | Primary: Internal Medicine

## 2016-07-21 ENCOUNTER — Encounter: Payer: Charity | Primary: Internal Medicine

## 2016-07-25 ENCOUNTER — Ambulatory Visit: Admit: 2016-07-25 | Discharge: 2016-07-25 | Attending: Family | Primary: Internal Medicine

## 2016-07-25 ENCOUNTER — Encounter

## 2016-07-25 ENCOUNTER — Inpatient Hospital Stay: Payer: Charity | Primary: Internal Medicine

## 2016-07-25 DIAGNOSIS — C61 Malignant neoplasm of prostate: Secondary | ICD-10-CM

## 2016-07-25 MED ORDER — PREDNISONE 5 MG TAB
5 mg | ORAL_TABLET | Freq: Every day | ORAL | 3 refills | Status: DC
Start: 2016-07-25 — End: 2016-07-25

## 2016-07-25 MED ORDER — PREDNISONE 5 MG TAB
5 mg | ORAL_TABLET | Freq: Every day | ORAL | 0 refills | Status: DC
Start: 2016-07-25 — End: 2017-04-09

## 2016-07-25 NOTE — Progress Notes (Signed)
Administrator, sports Oncology at [facility]   Nurse Navigator Note      Request from provider for Foundation assistance with prednisone.  Patient usually obtains medications through [facility] and will run out of his prednisone before he sees them again.    NN spoke with Outpatient Pharmacy to obtain [place] cost---$ 9.02. Emailed request to [facility] and will fax completed paperwork to pharmacy once approval obtained.

## 2016-07-25 NOTE — Progress Notes (Signed)
Hematology/Oncology Progress Note    REASON FOR VISIT: Metastatic castrate sensitive prostate cancer    HISTORY OF PRESENT ILLNESS: [name] is a 65 y.o. male who is on Eliquis for a h/o DVT who presented to the Emergency Department on 04/22/16 with nausea, vomiting, and abdominal pain x 2 months, decreased appetite and weight loss of 10-14 lbs.  CTA revealed bilateral lung nodules and mediastinal/hilar adenopathy. PET revealed supraclavicular adenopathy, retroperitoneal adenopathy, bilateral iliac adenopathy, mediastinal/hilar adenopathy, bony metastatic disease, right lower lobe pulmonary mass, and multiple other smaller masses in bilateral lungs. USG guided bx of supraclavicular LN showed adenocarcinoma of the prostate. He was also diagnosed and treated for CAP. PSA on 05/26/16-2400. Was discussed in tumor board, Napsin A negative and a small cell component ruled out. Initiated Degarelix  05/26/16. MRI spine with expansile C7 lesion, T12 lesion encroaching epidural space, S1-S2 lesion encroaching neural foramina. Started palliative C6-T5 and T11-L5 XRT under Dr. Leron Croak on 06/09/16    Family is estranged, comes with a friend, he lives with her- Bruce Clark.    Patient presents for follow up. Has been feeling great since completion of radiation. Eating and drinking well. No nausea or vomiting. No pain with swallowing. Has gained 10lbs in the last few weeks. No chest pain, dizziness, headache or swelling of extremities. No mouth sores. Energy has been good. No fever or chills in the home.     Otherwise, complete ROS is per the symptom report form which has been scanned into the media section of the electronic medical record.    No past medical history on file.    Past Surgical History:   Procedure Laterality Date   ??? HX HERNIA REPAIR         No Known Allergies    Current Outpatient Prescriptions   Medication Sig Dispense Refill   ??? predniSONE (DELTASONE) 5 mg tablet Take 1 Tab by mouth daily. 30 Tab 6    ??? apixaban (ELIQUIS) 5 mg tablet Take 1 Tab by mouth two (2) times a day. Indications: deep venous thrombosis 180 Tab 6   ??? magic mouthwash solution Magic mouth wash   Maalox  Lidocaine 2% viscous   Diphenhydramine oral solution   Swish and swallow 15m qid  Pharmacy to mix equal portions of ingredients to a total volume as indicated in the dispense amount. 480 mL 0   ??? abiraterone (ZYTIGA) 500 mg tab Take 1,000 mg by mouth daily. 60 Cap 6   ??? ondansetron hcl (ZOFRAN, AS HYDROCHLORIDE,) 8 mg tablet Take 1 Tab by mouth every eight (8) hours as needed for Nausea. Indications: PREVENTION OF CHEMOTHERAPY-INDUCED NAUSEA AND VOMITING 30 Tab 0   ??? pantoprazole (PROTONIX) 40 mg tablet Take 1 Tab by mouth daily. 60 Tab 6   ??? atorvastatin (LIPITOR) 20 mg tablet Take 1 Tab by mouth daily. 30 Tab 0       Social History     Social History   ??? Marital status: SINGLE     Spouse name: N/A   ??? Number of children: N/A   ??? Years of education: N/A     Social History Main Topics   ??? Smoking status: Former Smoker   ??? Smokeless tobacco: Never Used   ??? Alcohol use No   ??? Drug use: None   ??? Sexual activity: Not Asked     Other Topics Concern   ??? None     Social History Narrative       No family  history on file.    ROS  A 12 point review of systems was obtained and is negative except as listed in HPI.  ECOG PS is 1    Physical Examination:   Visit Vitals   ??? BP 101/68   ??? Pulse 86   ??? Temp 98.1 ??F (36.7 ??C)   ??? Resp 18   ??? Ht 5' 6"  (1.676 m)   ??? Wt 136 lb (61.7 kg)   ??? SpO2 100%   ??? BMI 21.95 kg/m2     General appearance - alert, frail appearing, and in no distress  Poor historian   Mental status - oriented to person, place, and time  Mouth - mucous membranes moist  Neck - supple, no significant adenopathy  Chest - clear to auscultation, no wheezes, rales or rhonchi, symmetric air entry  Heart - normal rate, regular rhythm, normal S1, S2, no murmurs, rubs, clicks or gallops  Abdomen - soft, nontender, nondistended, bowel sounds present   Neurological - normal speech, no focal findings or movement disorder noted  Musculoskeletal - no joint tenderness, deformity or swelling  Extremities - peripheral pulses normal, no pedal edema, no clubbing or cyanosis  Skin - normal coloration and turgor, no rashes, no suspicious skin lesions noted    LABS  Lab Results   Component Value Date/Time    WBC 3.4 07/01/2016 10:50 PM    HGB 12.7 07/01/2016 10:50 PM    HCT 36.9 07/01/2016 10:50 PM    PLATELET 168 07/01/2016 10:50 PM    MCV 86.4 07/01/2016 10:50 PM    ABS. NEUTROPHILS 1.6 07/01/2016 10:50 PM     Lab Results   Component Value Date/Time    Sodium 138 07/01/2016 10:50 PM    Potassium 4.5 07/01/2016 10:50 PM    Chloride 103 07/01/2016 10:50 PM    CO2 27 07/01/2016 10:50 PM    Glucose 79 07/01/2016 10:50 PM    BUN 10 07/01/2016 10:50 PM    Creatinine 0.50 07/01/2016 10:50 PM    GFR est AA >60 07/01/2016 10:50 PM    GFR est non-AA >60 07/01/2016 10:50 PM    Calcium 9.0 07/01/2016 10:50 PM     Lab Results   Component Value Date/Time    AST (SGOT) 18 07/01/2016 10:50 PM    Alk. phosphatase 117 07/01/2016 10:50 PM    Protein, total 7.4 07/01/2016 10:50 PM    Albumin 3.8 07/01/2016 10:50 PM    Globulin 3.6 07/01/2016 10:50 PM    A-G Ratio 1.1 07/01/2016 10:50 PM     IMAGING  PET 04/28/2016  IMPRESSION:   1. There is supraclavicular adenopathy left greater than right with increased  metabolic activity. There is retroperitoneal and bilateral iliac adenopathy with  increased metabolic activity and findings are suspicious for metastatic disease.  There is also mediastinal and hilar adenopathy which demonstrates only slight  increased metabolic activity could be metastatic or possibly reactive.  2. There is left hydroureteronephrosis with a left ureter dilated to mid pelvis  suspicious for obstruction.  3. There is bony metastatic disease. There is soft tissue in the paravertebral  region adjacent to C7 suspicious for soft tissue extension.    4. Prostate gland is enlarged and there is increased block activity in the  prostate gland anteriorly which could be related to primary neoplasm or possibly  prostatitis  5. There is a right lower lobe pulmonary mass with increased metabolic activity  of 4.2 which could represent a primary or metastatic  lesion.. There are multiple  other masses which are smaller which are demonstrating SUV of 2 or less may  represent metastatic lesions but less metabolic activity related to small size.  There is interstitial prominence and peribronchial thickening nonspecific but  could represent lymphangitic spread.     MRI Brain 04/23/16 - Small acute lacunar infarct in the superior posterior right frontal lobe.     CTA 04/23/16  IMPRESSION:   1. No pulmonary embolus.  2. Bilateral lung nodules.  3. Mediastinal and bilateral hilar adenopathy.  4. Peribronchial disease and interstitial lung disease.  5. Atherosclerotic aorta with coronary artery calcification.  6. Persistent left nephrogram with hydronephrosis.    MRI spine 04/29/16    1. Numerous metastatic lesions seen in the visualized portions of the thoracic  spine and in the lumbar spine, as well as in the pelvis. Lesion of the T12 level  minimally encroaches epidural space of the left lateral recess without causing  significant spinal canal stenosis. Lesion on the right at the S1-S2 level may  also encroach minimally on the neural foramen.  2. Mild multilevel degenerative disc disease and degenerative changes.   3. Extensive retroperitoneal lymphadenopathy noted. Left-sided hydronephrosis  likely caused by lymphadenopathy.    Supraclavicular node   CYTOLOGIC INTERPRETATION:   Adenocarcinoma, consistent with prostate primary   See comment   General Categorization   Positive for malignancy.   Specimen Adequacy   Satisfactory for evaluation.   Comment   Touch preps and a core biopsy are examined and show islands of adenocarcinoma surrounded by fibrous stroma. A panel of  immunohistochemical stains was performed to evaluate site of origin. The tumor cells are focally positive for PSA and PSAP and negative for CK7, CK20, TTF-1, CDX-2 and Napsin A. Morphology and immunoprofile are most consistent with a metastatic prostatic adenocarcinoma.    ASSESSMENT  [name] is a 65 y.o. male with newly diagnosed widely metastatic prostate adenocarcinoma who presented with, cancer associated DVT and acute lacunar infarct.     PLAN    Prostate cancer  Newly diagnosed, castrate sensitive, treatment na??ve widely metastatic prostate cancer to lymph nodes above and below the diaphragm and bones.  Prostate adenocarcinoma diagnosed on biopsy of the supraclavicular lymph node.  Discussed in tumor board and a small cell component has been ruled out.  PSA at the time of diagnosis at 2400  Reviewed scans with the patient and his friend today.  They understand the disease is unfortunately incurable.  We discussed palliative treatments with the goal of controlling continued spread of disease, improving symptoms, prolonging life.  The mainstay of palliative treatment for metastatic castrate sensitive disease is androgen deprivation therapy.  With his high bone disease we also reviewed up front docetaxel versus  Abiraterone in light of improved disease control and overall survival data from multiple large phase 3 trials (CHAARTED, STAMPEDE, LATTITUDE). Abiraterone and Docetaxel have not been compared head to head. Following previous discussions patient has decided on treatment with Zytiga.     He received Firmagon on 05/26/16. Switch to Lupron 45 mg every 6 months, next dose on 01/09/2017    Has not received Zytiga yet today. Will contact pharmacy to follow up regarding delivery.     Prednisone 84m daily with Zytiga.     Labs monthly on therapy. Given slips to have drawn now-[facility]     Bone metastases  Diffuse   Concerning lesions at C7, T12,  S1, S2 which threaten the integrity of the spinal column  Completed palliative radiation that was initiated on 10/23/7  Continue Prednisone 76m daily.   Hold on biphosphonates due to poor dental hygiene.     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   Has this medication on hand.     Tooth decay  Will see dentist for follow up on 10/16/15 to address poor dentition.   Mucositis has resolved.     Discussed with RN Navigator who will provide Prednisone prescription today. Further prescriptions to be done through [facility].     Follow up in 4 weeks on Zytiga.     Patient was seen with MLucrezia EuropeNP    MBrock Ra NP

## 2016-07-25 NOTE — Telephone Encounter (Signed)
Call to check on status of zytiga financial assistance processing with Wynetta Emery and [employer].  They are requiring a newer version of the form for completion.  She is to fax required forms.

## 2016-07-25 NOTE — Telephone Encounter (Signed)
Completed form to provider for signature

## 2016-07-25 NOTE — Telephone Encounter (Signed)
Faxed/Confirmation rec'd.  PAP assistance from completed, signed with appropriate doc to [employer].  Front to scan.

## 2016-08-13 NOTE — Telephone Encounter (Signed)
[phone]  Bruce Clark.  Call to check on financial assistance for patient/zytiga.      Per Elmo Putt, additional information needed/  Forwarded initial packet with additional information.  Confirmation rec'd.

## 2016-08-15 ENCOUNTER — Encounter

## 2016-08-19 NOTE — Telephone Encounter (Signed)
Call to [facility] and Wynetta Emery [phone]) inquire about submitted financial assistance for zytiga from 12/8 and then again 12/27.    Spoke with [place].  She will expedite request.  We will receive notification through fax and patient will receive notification by maile.  Med will be dispensed through [facility] [phone].    Will continue to follow.      21.30  minutes

## 2016-08-21 NOTE — Telephone Encounter (Signed)
Called to move appointment tomorrow due to inclement weather.  He was scheduled for 1/5 at 8:45am.  Called and spoke with nephew Jeanann Lewandowsky (on HIPPA) and he will pass information along and have Mr. Savard call back and reschedule.

## 2016-08-21 NOTE — Telephone Encounter (Signed)
Call to [facility] and [place].    Pt has been approved for zytiga.  He should have received a phone call yesterday for delivery. No later than today.

## 2016-08-22 ENCOUNTER — Encounter: Payer: Charity | Primary: Internal Medicine

## 2016-08-22 ENCOUNTER — Encounter: Attending: Family | Primary: Internal Medicine

## 2016-08-26 ENCOUNTER — Ambulatory Visit: Admit: 2016-08-26 | Discharge: 2016-08-26 | Attending: Family | Primary: Internal Medicine

## 2016-08-26 ENCOUNTER — Encounter

## 2016-08-26 DIAGNOSIS — C61 Malignant neoplasm of prostate: Secondary | ICD-10-CM

## 2016-08-26 NOTE — Telephone Encounter (Signed)
Confirmed with patient, he has not received a call from [facility] regarding delivery of zytiga as indicated 1/4.      Call to [employer].  She will call patient now for delivery Thursday.  Will continue to follow.Marland Kitchen

## 2016-08-26 NOTE — Progress Notes (Signed)
Hematology/Oncology Progress Note    REASON FOR VISIT: Metastatic castrate sensitive prostate cancer    HISTORY OF PRESENT ILLNESS: Bruce Clark is a 66 y.o. male who is on Eliquis for a h/o DVT who presented to the Emergency Department on 04/22/16 with nausea, vomiting, and abdominal pain x 2 months, decreased appetite and weight loss of 10-14 lbs.  CTA revealed bilateral lung nodules and mediastinal/hilar adenopathy. PET revealed supraclavicular adenopathy, retroperitoneal adenopathy, bilateral iliac adenopathy, mediastinal/hilar adenopathy, bony metastatic disease, right lower lobe pulmonary mass, and multiple other smaller masses in bilateral lungs. USG guided bx of supraclavicular LN showed adenocarcinoma of the prostate. He was also diagnosed and treated for CAP. PSA on 05/26/16-2400. Was discussed in tumor board, Napsin A negative and a small cell component ruled out. Initiated Degarelix  05/26/16. MRI spine with expansile C7 lesion, T12 lesion encroaching epidural space, S1-S2 lesion encroaching neural foramina. Started palliative C6-T5 and T11-L5 XRT under Dr. Leron Croak on 06/09/16    Family is estranged, comes with a friend, he lives with her- Bruce Clark.    Patient presents for follow up. Has not started Zytiga yet. States he has not gotten a call regarding shipment. Feeling well. No new concerns today. Has been gaining weight. Eating and drinking well. No issue with appetite. No headache or dizziness. Denies pain. No swelling of extremities. No mouth sores. No difficulty swallowing.     Otherwise, complete ROS is per the symptom report form which has been scanned into the media section of the electronic medical record.    No past medical history on file.    Past Surgical History:   Procedure Laterality Date   ??? HX HERNIA REPAIR         No Known Allergies    Current Outpatient Prescriptions   Medication Sig Dispense Refill   ??? magic mouthwash solution Magic mouth wash   Maalox  Lidocaine 2% viscous    Diphenhydramine oral solution   Swish and swallow 25m qid  Pharmacy to mix equal portions of ingredients to a total volume as indicated in the dispense amount. 480 mL 0   ??? apixaban (ELIQUIS) 5 mg tablet Take 1 Tab by mouth two (2) times a day. Indications: deep venous thrombosis 180 Tab 6   ??? atorvastatin (LIPITOR) 20 mg tablet Take 1 Tab by mouth daily. 30 Tab 0   ??? predniSONE (DELTASONE) 5 mg tablet Take 1 Tab by mouth daily. 30 Tab 0   ??? abiraterone (ZYTIGA) 500 mg tab Take 1,000 mg by mouth daily. 60 Cap 6       Social History     Social History   ??? Marital status: SINGLE     Spouse name: N/A   ??? Number of children: N/A   ??? Years of education: N/A     Social History Main Topics   ??? Smoking status: Former Smoker   ??? Smokeless tobacco: Never Used   ??? Alcohol use No   ??? Drug use: None   ??? Sexual activity: Not Asked     Other Topics Concern   ??? None     Social History Narrative       No family history on file.    ROS  A 12 point review of systems was obtained and is negative except as listed in HPI.  ECOG PS is 1    Physical Examination:   Visit Vitals   ??? BP 100/68   ??? Pulse 74   ??? Temp 98.9 ??F (  37.2 ??C)   ??? Resp 20   ??? Ht 5' 6"  (1.676 m)   ??? Wt 149 lb 12.8 oz (67.9 kg)   ??? SpO2 98%   ??? BMI 24.18 kg/m2     General appearance - alert, frail appearing, and in no distress  Poor historian   Mental status - oriented to person, place, and time  Mouth - mucous membranes moist  Neck - supple, no significant adenopathy  Chest - clear to auscultation, no wheezes, rales or rhonchi, symmetric air entry  Heart - normal rate, regular rhythm, normal S1, S2, no murmurs, rubs, clicks or gallops  Abdomen - soft, nontender, nondistended, bowel sounds present  Neurological - normal speech, no focal findings or movement disorder noted  Musculoskeletal - no joint tenderness, deformity or swelling  Extremities - peripheral pulses normal, no pedal edema, no clubbing or cyanosis   Skin - normal coloration and turgor, no rashes, no suspicious skin lesions noted    LABS  Lab Results   Component Value Date/Time    WBC 3.4 07/01/2016 10:50 PM    HGB 12.7 07/01/2016 10:50 PM    HCT 36.9 07/01/2016 10:50 PM    PLATELET 168 07/01/2016 10:50 PM    MCV 86.4 07/01/2016 10:50 PM    ABS. NEUTROPHILS 1.6 07/01/2016 10:50 PM     Lab Results   Component Value Date/Time    Sodium 138 07/01/2016 10:50 PM    Potassium 4.5 07/01/2016 10:50 PM    Chloride 103 07/01/2016 10:50 PM    CO2 27 07/01/2016 10:50 PM    Glucose 79 07/01/2016 10:50 PM    BUN 10 07/01/2016 10:50 PM    Creatinine 0.50 07/01/2016 10:50 PM    GFR est AA >60 07/01/2016 10:50 PM    GFR est non-AA >60 07/01/2016 10:50 PM    Calcium 9.0 07/01/2016 10:50 PM     Lab Results   Component Value Date/Time    AST (SGOT) 18 07/01/2016 10:50 PM    Alk. phosphatase 117 07/01/2016 10:50 PM    Protein, total 7.4 07/01/2016 10:50 PM    Albumin 3.8 07/01/2016 10:50 PM    Globulin 3.6 07/01/2016 10:50 PM    A-G Ratio 1.1 07/01/2016 10:50 PM     IMAGING  PET 04/28/2016  IMPRESSION:   1. There is supraclavicular adenopathy left greater than right with increased  metabolic activity. There is retroperitoneal and bilateral iliac adenopathy with  increased metabolic activity and findings are suspicious for metastatic disease.  There is also mediastinal and hilar adenopathy which demonstrates only slight  increased metabolic activity could be metastatic or possibly reactive.  2. There is left hydroureteronephrosis with a left ureter dilated to mid pelvis  suspicious for obstruction.  3. There is bony metastatic disease. There is soft tissue in the paravertebral  region adjacent to C7 suspicious for soft tissue extension.   4. Prostate gland is enlarged and there is increased block activity in the  prostate gland anteriorly which could be related to primary neoplasm or possibly  prostatitis  5. There is a right lower lobe pulmonary mass with increased metabolic  activity  of 4.2 which could represent a primary or metastatic lesion.. There are multiple  other masses which are smaller which are demonstrating SUV of 2 or less may  represent metastatic lesions but less metabolic activity related to small size.  There is interstitial prominence and peribronchial thickening nonspecific but  could represent lymphangitic spread.     MRI Brain  04/23/16 - Small acute lacunar infarct in the superior posterior right frontal lobe.     CTA 04/23/16  IMPRESSION:   1. No pulmonary embolus.  2. Bilateral lung nodules.  3. Mediastinal and bilateral hilar adenopathy.  4. Peribronchial disease and interstitial lung disease.  5. Atherosclerotic aorta with coronary artery calcification.  6. Persistent left nephrogram with hydronephrosis.    MRI spine 04/29/16    1. Numerous metastatic lesions seen in the visualized portions of the thoracic  spine and in the lumbar spine, as well as in the pelvis. Lesion of the T12 level  minimally encroaches epidural space of the left lateral recess without causing  significant spinal canal stenosis. Lesion on the right at the S1-S2 level may  also encroach minimally on the neural foramen.  2. Mild multilevel degenerative disc disease and degenerative changes.   3. Extensive retroperitoneal lymphadenopathy noted. Left-sided hydronephrosis  likely caused by lymphadenopathy.    Supraclavicular node   CYTOLOGIC INTERPRETATION:   Adenocarcinoma, consistent with prostate primary   See comment   General Categorization   Positive for malignancy.   Specimen Adequacy   Satisfactory for evaluation.   Comment   Touch preps and a core biopsy are examined and show islands of adenocarcinoma surrounded by fibrous stroma. A panel of immunohistochemical stains was performed to evaluate site of origin. The tumor cells are focally positive for PSA and PSAP and negative for CK7, CK20, TTF-1, CDX-2 and Napsin A. Morphology and immunoprofile are most  consistent with a metastatic prostatic adenocarcinoma.    ASSESSMENT  Bruce Clark is a 66 y.o. male with newly diagnosed widely metastatic prostate adenocarcinoma who presented with, cancer associated DVT and acute lacunar infarct.     PLAN    Prostate cancer  Castrate sensitive, treatment na??ve widely metastatic prostate cancer to lymph nodes above and below the diaphragm and bones.  Prostate adenocarcinoma diagnosed on biopsy of the supraclavicular lymph node.  Discussed in tumor board and a small cell component has been ruled out.  PSA at the time of diagnosis at 2400  Reviewed scans with the patient and his friend today.  They understand the disease is unfortunately incurable.  We discussed palliative treatments with the goal of controlling continued spread of disease, improving symptoms, prolonging life.  The mainstay of palliative treatment for metastatic castrate sensitive disease is androgen deprivation therapy.  With his high bone disease we also reviewed up front docetaxel versus  Abiraterone in light of improved disease control and overall survival data from multiple large phase 3 trials (CHAARTED, STAMPEDE, LATTITUDE). Abiraterone and Docetaxel have not been compared head to head. Following previous discussions patient has decided on treatment with Zytiga.     He received Firmagon on 05/26/16. Switched to Lupron 45 mg every 6 months, next dose on 01/09/2017    Has not received Zytiga yet today. He did get approval from [facility] and [place] for free drug. They were due to ship out on 08/22/15. Will contact them again today to set up delivery. Confirmed home address and correct phone number.     Prednisone 12m daily with Zytiga.     CBC and CMP with PSA monthly. [facility] to [facility]. Will need to check CBC with diff and CMP in 2 week intervals during first 2 cycles. Will send new lab orders to have done and communicate plan to [facility]     Bone metastases  Diffuse   Concerning lesions at C7, T12, S1,  S2 which threaten the integrity of the spinal column  Completed palliative radiation that was initiated on 10/23/7  Continue Prednisone 64m daily.   Hold on biphosphonates due to poor dental hygiene.     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     Tooth decay  Will see dentist for follow up on 10/16/15 to address poor dentition in preparation for bisphosphonate therapy.      Follow up in 4 weeks on Zytiga.     Patient was seen with MLucrezia [place]NP    RJuliene Pina MD

## 2016-08-27 NOTE — Telephone Encounter (Signed)
Call and spoke with Bruce Clark/contact for patient.  HIPAA verified.    States [employer] will be delivering the zytiga tomorrow and he will start drug tomorrow.  States she does not have any additional questions at this time.  Pt had MD visit yesterday and she was provided with all information and lab scheduled.      Encouraged her to call with any questions/concerns.  She is aware of contact information.

## 2016-09-04 NOTE — Telephone Encounter (Signed)
Spoke with Bruce Clark/contact for patient.    Patient did start his zytiga on 1/11.  Without problems or questions at this time.  She is aware and will have labs drawn on 1/29.  All labs to be done at [facility].    Thanked for call.

## 2016-09-16 ENCOUNTER — Inpatient Hospital Stay: Admit: 2016-09-16 | Payer: Self-pay | Primary: Internal Medicine

## 2016-09-16 LAB — METABOLIC PANEL, COMPREHENSIVE
A-G Ratio: 1 — ABNORMAL LOW (ref 1.1–2.2)
ALT (SGPT): 17 U/L (ref 12–78)
AST (SGOT): 20 U/L (ref 15–37)
Albumin: 3.9 g/dL (ref 3.5–5.0)
Alk. phosphatase: 111 U/L (ref 45–117)
Anion gap: 5 mmol/L (ref 5–15)
BUN/Creatinine ratio: 14 (ref 12–20)
BUN: 9 MG/DL (ref 6–20)
Bilirubin, total: 0.2 MG/DL (ref 0.2–1.0)
CO2: 29 mmol/L (ref 21–32)
Calcium: 9.3 MG/DL (ref 8.5–10.1)
Chloride: 106 mmol/L (ref 97–108)
Creatinine: 0.63 MG/DL — ABNORMAL LOW (ref 0.70–1.30)
GFR est AA: >60 mL/min/{1.73_m2} (ref >60)
GFR est non-AA: >60 mL/min/{1.73_m2} (ref >60)
Globulin: 3.9 g/dL (ref 2.0–4.0)
Glucose: 71 mg/dL (ref 65–100)
Potassium: 4 mmol/L (ref 3.5–5.1)
Protein, total: 7.8 g/dL (ref 6.4–8.2)
Sodium: 140 mmol/L (ref 136–145)

## 2016-09-16 LAB — CBC WITH AUTOMATED DIFF
ABS. BASOPHILS: 0 10*3/uL (ref 0.0–0.1)
ABS. EOSINOPHILS: 0.1 10*3/uL (ref 0.0–0.4)
ABS. IMM. GRANS.: 0 10*3/uL (ref 0.00–0.04)
ABS. LYMPHOCYTES: 1.1 10*3/uL (ref 0.8–3.5)
ABS. MONOCYTES: 0.5 10*3/uL (ref 0.0–1.0)
ABS. NEUTROPHILS: 3 10*3/uL (ref 1.8–8.0)
ABSOLUTE NRBC: 0 10*3/uL (ref 0.00–0.01)
BASOPHILS: 0 % (ref 0–1)
EOSINOPHILS: 3 % (ref 0–7)
HCT: 40.1 % (ref 36.6–50.3)
HGB: 13.2 g/dL (ref 12.1–17.0)
IMMATURE GRANULOCYTES: 0 % (ref 0.0–0.5)
LYMPHOCYTES: 23 % (ref 12–49)
MCH: 31.6 PG (ref 26.0–34.0)
MCHC: 32.9 g/dL (ref 30.0–36.5)
MCV: 95.9 FL (ref 80.0–99.0)
MONOCYTES: 10 % (ref 5–13)
MPV: 9.9 FL (ref 8.9–12.9)
NEUTROPHILS: 64 % (ref 32–75)
NRBC: 0 PER 100 WBC
PLATELET: 209 10*3/uL (ref 150–400)
RBC: 4.18 M/uL (ref 4.10–5.70)
RDW: 13.6 % (ref 11.5–14.5)
WBC: 4.8 10*3/uL (ref 4.1–11.1)

## 2016-09-17 LAB — PSA, DIAGNOSTIC (PROSTATE SPECIFIC AG): Prostate Specific Ag: 1.2 ng/mL (ref 0.0–4.0)

## 2016-09-17 NOTE — Progress Notes (Signed)
[place] application for lupron signed, completed and successfully faxed to [phone]

## 2016-09-19 ENCOUNTER — Encounter: Payer: Charity | Primary: Internal Medicine

## 2016-09-26 ENCOUNTER — Ambulatory Visit: Admit: 2016-09-26 | Discharge: 2016-09-26 | Attending: Family | Primary: Internal Medicine

## 2016-09-26 ENCOUNTER — Inpatient Hospital Stay: Admit: 2016-09-26 | Payer: Charity | Attending: Family | Primary: Internal Medicine

## 2016-09-26 DIAGNOSIS — I82511 Chronic embolism and thrombosis of right femoral vein: Secondary | ICD-10-CM

## 2016-09-26 DIAGNOSIS — C61 Malignant neoplasm of prostate: Secondary | ICD-10-CM

## 2016-09-26 NOTE — Procedures (Signed)
[facility]  *** FINAL REPORT ***    Name: Bruce Clark, Bruce Clark  MRN: [id number]    Outpatient  DOB: 01/08/1951  HIS Order #: [id number]  [place] Visit #: [id number]  Date: 26 Sep 2016    TYPE OF TEST: Peripheral Venous Testing    REASON FOR TEST  Limb swelling    Right Leg:-  Deep venous thrombosis:           Yes  Proximal extent of thrombus:      Common Femoral  Superficial venous thrombosis:    No  Deep venous insufficiency:        Not examined  Superficial venous insufficiency: Not examined      INTERPRETATION/FINDINGS  PROCEDURE:  Color duplex ultrasound imaging of lower extremity veins.    FINDINGS:       Right: Consistent with partial thrombosis involving the commen  femoral, deep femoral, femoral, popliteal, one of the two veins of  posterior tibial and peronial veins as demonstrated by vein partial  compressibility, and by a narrowing or occlusion of the flow channel  on color Doppler imaging.The great saphenous vein is patent and  without evidence of thrombus; it is is fully compressible and there is   no narrowing of the flow channel on color Doppler imaging.  Continious flow is observed in the common femoral vein.       Left:   The common femoral vein is patent and without evidence of   thrombus.  Phasic flow is observed.  This extremity was not otherwise   evaluated.    IMPRESSION:  There is evidence of vein thrombosis, as described above.    The ultrasound appearance is more consistent with a chronic than an  acute process.    ADDITIONAL COMMENTS    I have personally reviewed the data relevant to the interpretation of  this  study.    TECHNOLOGIST: Domingo Cocking. Lanae Crumbly, RVS  Signed: 09/26/2016 11:13 AM    PHYSICIAN: Marletta Lor, MD  Signed: 09/26/2016 05:49 PM

## 2016-09-26 NOTE — Procedures (Signed)
[facility]  *** FINAL REPORT ***    Name: Bruce Clark, Bruce Clark  MRN: [id number]    Outpatient  DOB: 1951-02-04  HIS Order #: [id number]  [place] Visit #: [id number]  Date: 26 Sep 2016    TYPE OF TEST: Peripheral Venous Testing    REASON FOR TEST  Limb swelling    Right Leg:-  Deep venous thrombosis:           Yes  Proximal extent of thrombus:      Common Femoral  Superficial venous thrombosis:    No  Deep venous insufficiency:        Not examined  Superficial venous insufficiency: Not examined      INTERPRETATION/FINDINGS  PROCEDURE:  Color duplex ultrasound imaging of lower extremity veins.    FINDINGS:       Right: Consistent with partial thrombosis involving the commen  femoral, deep femoral, femoral, popliteal, one of the two veins of  posterior tibial and peronial veins as demonstrated by vein partial  compressibility, and by a narrowing or occlusion of the flow channel  on color Doppler imaging.The great saphenous vein is patent and  without evidence of thrombus; it is is fully compressible and there is   no narrowing of the flow channel on color Doppler imaging.  Continious flow is observed in the common femoral vein.       Left:   The common femoral vein is patent and without evidence of   thrombus.  Phasic flow is observed.  This extremity was not otherwise   evaluated.    IMPRESSION:  There is evidence of vein thrombosis, as described above.    The ultrasound appearance is more consistent with a chronic than an  acute process.    ADDITIONAL COMMENTS    I have personally reviewed the data relevant to the interpretation of  this  study.    TECHNOLOGIST: Domingo Cocking. Lanae Crumbly, RVS  Signed: 09/26/2016 11:13 AM    PHYSICIAN: Marletta Lor, MD  Signed: 09/26/2016 05:49 PM

## 2016-09-26 NOTE — Progress Notes (Signed)
Hematology/Oncology Progress Note    REASON FOR VISIT: Metastatic castrate sensitive prostate cancer    HISTORY OF PRESENT ILLNESS: Mr. Kulikowski is a 66 y.o. male who is on Eliquis for a h/o DVT who presented to the Emergency Department on 04/22/16 with nausea, vomiting, and abdominal pain x 2 months, decreased appetite and weight loss of 10-14 lbs.  CTA revealed bilateral lung nodules and mediastinal/hilar adenopathy. PET revealed supraclavicular adenopathy, retroperitoneal adenopathy, bilateral iliac adenopathy, mediastinal/hilar adenopathy, bony metastatic disease, right lower lobe pulmonary mass, and multiple other smaller masses in bilateral lungs. USG guided bx of supraclavicular LN showed adenocarcinoma of the prostate. He was also diagnosed and treated for CAP. PSA on 05/26/16-2400. Was discussed in tumor board, Napsin A negative and a small cell component ruled out. Initiated Degarelix  05/26/16. MRI spine with expansile C7 lesion, T12 lesion encroaching epidural space, S1-S2 lesion encroaching neural foramina. Started palliative C6-T5 and T11-L5 XRT under Dr. Leron Croak on 06/09/16    Family is estranged, comes with a friend, he lives with her- [name].    Patient presents for follow up. Started Zytiga 08/28/16. Mr Hemp reports feeling well and improved appetite with subjective weight gain since starting Zytiga. Concerned for RLE swelling that started when starting medication, reports Hx major trauma to RLE.  Denies tenderness and parastheisa.  Denies SOB, fever, and chills; endorses cough and URI symptoms that are resolving. Reports he is walking ~ 1/2 mile per day.  Describes mild fatigue, but is still able to complete ADLs. Denies N/V/D.      Otherwise, complete ROS is per the symptom report form which has been scanned into the media section of the electronic medical record.    No past medical history on file.    Past Surgical History:   Procedure Laterality Date   ??? HX HERNIA REPAIR          No Known Allergies    Current Outpatient Prescriptions   Medication Sig Dispense Refill   ??? predniSONE (DELTASONE) 5 mg tablet Take 1 Tab by mouth daily. 30 Tab 0   ??? abiraterone (ZYTIGA) 500 mg tab Take 1,000 mg by mouth daily. 60 Cap 6   ??? apixaban (ELIQUIS) 5 mg tablet Take 1 Tab by mouth two (2) times a day. Indications: deep venous thrombosis 180 Tab 6       Social History     Social History   ??? Marital status: SINGLE     Spouse name: N/A   ??? Number of children: N/A   ??? Years of education: N/A     Social History Main Topics   ??? Smoking status: Former Smoker   ??? Smokeless tobacco: Never Used   ??? Alcohol use No   ??? Drug use: None   ??? Sexual activity: Not Asked     Other Topics Concern   ??? None     Social History Narrative       No family history on file.    ROS  A 12 point review of systems was obtained and is negative except as listed in HPI.  ECOG PS is 1    Physical Examination:   Visit Vitals   ??? BP 117/71   ??? Pulse 65   ??? Temp 98.2 ??F (36.8 ??C)   ??? Resp 16   ??? Ht 5' 6" (1.676 m)   ??? Wt 154 lb 12.8 oz (70.2 kg)   ??? SpO2 97%   ??? BMI 24.99 kg/m2  General appearance - alert, frail appearing, and in no distress  Poor historian   Mental status - oriented to person, place, and time  Mouth - mucous membranes moist  Neck - supple, no significant adenopathy  Chest - clear to auscultation, no wheezes, rales or rhonchi, symmetric air entry  Heart - normal rate, regular rhythm, normal S1, S2, no murmurs, rubs, clicks or gallops  Abdomen - soft, nontender, nondistended, bowel sounds present  Neurological - normal speech, no focal findings or movement disorder noted  Musculoskeletal - no joint tenderness, deformity or swelling  Extremities - peripheral pulses normal, no pedal edema, no clubbing or cyanosis  Skin - normal coloration and turgor, no rashes, no suspicious skin lesions noted    LABS  Lab Results   Component Value Date/Time    WBC 4.8 09/15/2016 09:20 AM    HGB 13.2 09/15/2016 09:20 AM     HCT 40.1 09/15/2016 09:20 AM    PLATELET 209 09/15/2016 09:20 AM    MCV 95.9 09/15/2016 09:20 AM    ABS. NEUTROPHILS 3.0 09/15/2016 09:20 AM     Lab Results   Component Value Date/Time    Sodium 140 09/15/2016 09:20 AM    Potassium 4.0 09/15/2016 09:20 AM    Chloride 106 09/15/2016 09:20 AM    CO2 29 09/15/2016 09:20 AM    Glucose 71 09/15/2016 09:20 AM    BUN 9 09/15/2016 09:20 AM    Creatinine 0.63 (L) 09/15/2016 09:20 AM    GFR est AA >60 09/15/2016 09:20 AM    GFR est non-AA >60 09/15/2016 09:20 AM    Calcium 9.3 09/15/2016 09:20 AM     Lab Results   Component Value Date/Time    AST (SGOT) 20 09/15/2016 09:20 AM    Alk. phosphatase 111 09/15/2016 09:20 AM    Protein, total 7.8 09/15/2016 09:20 AM    Albumin 3.9 09/15/2016 09:20 AM    Globulin 3.9 09/15/2016 09:20 AM    A-G Ratio 1.0 (L) 09/15/2016 09:20 AM     IMAGING  PET 04/28/2016  IMPRESSION:   1. There is supraclavicular adenopathy left greater than right with increased  metabolic activity. There is retroperitoneal and bilateral iliac adenopathy with  increased metabolic activity and findings are suspicious for metastatic disease.  There is also mediastinal and hilar adenopathy which demonstrates only slight  increased metabolic activity could be metastatic or possibly reactive.  2. There is left hydroureteronephrosis with a left ureter dilated to mid pelvis  suspicious for obstruction.  3. There is bony metastatic disease. There is soft tissue in the paravertebral  region adjacent to C7 suspicious for soft tissue extension.   4. Prostate gland is enlarged and there is increased block activity in the  prostate gland anteriorly which could be related to primary neoplasm or possibly  prostatitis  5. There is a right lower lobe pulmonary mass with increased metabolic activity  of 4.2 which could represent a primary or metastatic lesion.. There are multiple  other masses which are smaller which are demonstrating SUV of 2 or less may   represent metastatic lesions but less metabolic activity related to small size.  There is interstitial prominence and peribronchial thickening nonspecific but  could represent lymphangitic spread.     MRI Brain 04/23/16 - Small acute lacunar infarct in the superior posterior right frontal lobe.     CTA 04/23/16  IMPRESSION:   1. No pulmonary embolus.  2. Bilateral lung nodules.  3. Mediastinal and bilateral hilar  adenopathy.  4. Peribronchial disease and interstitial lung disease.  5. Atherosclerotic aorta with coronary artery calcification.  6. Persistent left nephrogram with hydronephrosis.    MRI spine 04/29/16    1. Numerous metastatic lesions seen in the visualized portions of the thoracic  spine and in the lumbar spine, as well as in the pelvis. Lesion of the T12 level  minimally encroaches epidural space of the left lateral recess without causing  significant spinal canal stenosis. Lesion on the right at the S1-S2 level may  also encroach minimally on the neural foramen.  2. Mild multilevel degenerative disc disease and degenerative changes.   3. Extensive retroperitoneal lymphadenopathy noted. Left-sided hydronephrosis  likely caused by lymphadenopathy.    Supraclavicular node   CYTOLOGIC INTERPRETATION:   Adenocarcinoma, consistent with prostate primary   See comment   General Categorization   Positive for malignancy.   Specimen Adequacy   Satisfactory for evaluation.   Comment   Touch preps and a core biopsy are examined and show islands of adenocarcinoma surrounded by fibrous stroma. A panel of immunohistochemical stains was performed to evaluate site of origin. The tumor cells are focally positive for PSA and PSAP and negative for CK7, CK20, TTF-1, CDX-2 and Napsin A. Morphology and immunoprofile are most consistent with a metastatic prostatic adenocarcinoma.    ASSESSMENT  [name] is a 66 y.o. male with newly diagnosed widely metastatic  prostate adenocarcinoma who presented with, cancer associated DVT and acute lacunar infarct.     PLAN    Prostate cancer  Castrate sensitive, treatment na??ve widely metastatic prostate cancer to lymph nodes above and below the diaphragm and bones.  Prostate adenocarcinoma diagnosed on biopsy of the supraclavicular lymph node.  Discussed in tumor board and a small cell component has been ruled out.  PSA at the time of diagnosis at 2400  Reviewed scans with the patient and his friend today.  They understand the disease is unfortunately incurable.  We discussed palliative treatments with the goal of controlling continued spread of disease, improving symptoms, prolonging life.  The mainstay of palliative treatment for metastatic castrate sensitive disease is androgen deprivation therapy.  With his high bone disease we also reviewed up front docetaxel versus  Abiraterone in light of improved disease control and overall survival data from multiple large phase 3 trials (CHAARTED, STAMPEDE, LATTITUDE). Abiraterone and Docetaxel have not been compared head to head. Following previous discussions patient has decided on treatment with Zytiga.     He received Firmagon on 05/26/16. Switched to Lupron 45 mg every 6 months, next dose on 01/09/2017    Prednisone 42m daily with Zytiga. Continue Zytiga daily.     CBC and CMP with PSA monthly. PSA with dramatic response to therapy down to 1.2 on 09/15/16. Labs reviewed with patient.     Bone metastases  Diffuse  Concerning lesions at C7, T12, S1, S2 which threaten the integrity of the spinal column  Completed palliative radiation that was initiated on 06/09/16  Continue Prednisone 586mdaily.   Hold on biphosphonates due to poor dental hygiene.     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     Tooth decay  Will see dentist for follow up on 10/16/15 to address poor dentition in  preparation for bisphosphonate therapy.      Lower extremity edema    Nontender 3+ pitting edema to RLE, taking apixaban BID, pt sent for ultrasound  today r/o DVT and potential Eliquis failure      Follow up in 4 weeks on Zytiga.     Patient was seen with [name] NP    Juliene Pina, MD

## 2016-09-26 NOTE — Progress Notes (Signed)
Bruce Clark is a 66 y.o. male here today for follow up, prostate cancer. Patient states he has noted some swelling to right leg off/on, no pain noted. Reports increased appetite.

## 2016-10-13 ENCOUNTER — Inpatient Hospital Stay: Admit: 2016-10-13 | Payer: Charity | Attending: Radiation Oncology | Primary: Internal Medicine

## 2016-10-13 DIAGNOSIS — C7951 Secondary malignant neoplasm of bone: Secondary | ICD-10-CM

## 2016-10-13 DIAGNOSIS — C61 Malignant neoplasm of prostate: Secondary | ICD-10-CM

## 2016-10-13 MED ORDER — GADOTERIDOL 279.3 MG/ML INTRAVENOUS SOLUTION
279.3 mg/mL | Freq: Once | INTRAVENOUS | Status: AC
Start: 2016-10-13 — End: 2016-10-13
  Administered 2016-10-13: 19:00:00 via INTRAVENOUS

## 2016-10-13 MED FILL — PROHANCE 279.3 MG/ML INTRAVENOUS SOLUTION: 279.3 mg/mL | INTRAVENOUS | Qty: 14

## 2016-10-17 ENCOUNTER — Encounter: Payer: Charity | Primary: Internal Medicine

## 2016-10-24 ENCOUNTER — Encounter

## 2016-10-24 ENCOUNTER — Ambulatory Visit: Admit: 2016-10-24 | Discharge: 2016-10-24 | Attending: Family | Primary: Internal Medicine

## 2016-10-24 DIAGNOSIS — C7951 Secondary malignant neoplasm of bone: Secondary | ICD-10-CM

## 2016-10-24 NOTE — Progress Notes (Signed)
Hence Cozort is a 65 y.o. male here today for follow up, prostate cancer.

## 2016-10-24 NOTE — Progress Notes (Signed)
Hematology/Oncology Progress Note    REASON FOR VISIT: Metastatic castrate sensitive prostate cancer    HISTORY OF PRESENT ILLNESS: Mr. Vanroekel is a 66 y.o. male who is on Eliquis for a h/o DVT who presented to the Emergency Department on 04/22/16 with nausea, vomiting, and abdominal pain x 2 months, decreased appetite and weight loss of 10-14 lbs.  CTA revealed bilateral lung nodules and mediastinal/hilar adenopathy. PET revealed supraclavicular adenopathy, retroperitoneal adenopathy, bilateral iliac adenopathy, mediastinal/hilar adenopathy, bony metastatic disease, right lower lobe pulmonary mass, and multiple other smaller masses in bilateral lungs. USG guided bx of supraclavicular LN showed adenocarcinoma of the prostate. He was also diagnosed and treated for CAP. PSA on 05/26/16-2400. Was discussed in tumor board, Napsin A negative and a small cell component ruled out. Initiated Degarelix  05/26/16. MRI spine with expansile C7 lesion, T12 lesion encroaching epidural space, S1-S2 lesion encroaching neural foramina. Started palliative C6-T5 and T11-L5 XRT under Dr. Leron Croak on 06/09/16    Family is estranged, comes with a friend, he lives with her- [name].    Patient presents for follow up. Started Zytiga 08/28/16.     Feeling very well overall. No nausea, vomiting, diarrhea, constipation. No fever or chills. No mouth sores. Appetite has been great, gaining weight. No swelling of extremities. No issues with blood thinner, taking everyday. No blood in stool or urine. Walking about a mile a day. Energy has been good. No longer in pain.     Otherwise, complete ROS is per the symptom report form which has been scanned into the media section of the electronic medical record.    No past medical history on file.    Past Surgical History:   Procedure Laterality Date   ??? HX HERNIA REPAIR         No Known Allergies    Current Outpatient Prescriptions   Medication Sig Dispense Refill    ??? predniSONE (DELTASONE) 5 mg tablet Take 1 Tab by mouth daily. 30 Tab 0   ??? abiraterone (ZYTIGA) 500 mg tab Take 1,000 mg by mouth daily. 60 Cap 6   ??? apixaban (ELIQUIS) 5 mg tablet Take 1 Tab by mouth two (2) times a day. Indications: deep venous thrombosis 180 Tab 6       Social History     Social History   ??? Marital status: SINGLE     Spouse name: N/A   ??? Number of children: N/A   ??? Years of education: N/A     Social History Main Topics   ??? Smoking status: Former Smoker   ??? Smokeless tobacco: Never Used   ??? Alcohol use No   ??? Drug use: None   ??? Sexual activity: Not Asked     Other Topics Concern   ??? None     Social History Narrative       No family history on file.    ROS  A 12 point review of systems was obtained and is negative except as listed in HPI.  ECOG PS is 1    Physical Examination:   Visit Vitals   ??? BP 130/76   ??? Pulse 64   ??? Temp 98.9 ??F (37.2 ??C)   ??? Resp 16   ??? Ht 5' 7"  (1.702 m)   ??? Wt 158 lb 3.2 oz (71.8 kg)   ??? SpO2 96%   ??? BMI 24.78 kg/m2     General appearance - alert, frail appearing, and in no distress  Poor historian  Mental status - oriented to person, place, and time  Mouth - mucous membranes moist  Neck - supple, no significant adenopathy  Chest - clear to auscultation, no wheezes, rales or rhonchi, symmetric air entry  Heart - normal rate, regular rhythm, normal S1, S2, no murmurs, rubs, clicks or gallops  Abdomen - soft, nontender, nondistended, bowel sounds present  Neurological - normal speech, no focal findings or movement disorder noted  Musculoskeletal - no joint tenderness, deformity or swelling  Extremities - peripheral pulses normal, no pedal edema, no clubbing or cyanosis  Skin - normal coloration and turgor, no rashes, no suspicious skin lesions noted    LABS  Lab Results   Component Value Date/Time    WBC 4.8 09/15/2016 09:20 AM    HGB 13.2 09/15/2016 09:20 AM    HCT 40.1 09/15/2016 09:20 AM    PLATELET 209 09/15/2016 09:20 AM    MCV 95.9 09/15/2016 09:20 AM     ABS. NEUTROPHILS 3.0 09/15/2016 09:20 AM     Lab Results   Component Value Date/Time    Sodium 140 09/15/2016 09:20 AM    Potassium 4.0 09/15/2016 09:20 AM    Chloride 106 09/15/2016 09:20 AM    CO2 29 09/15/2016 09:20 AM    Glucose 71 09/15/2016 09:20 AM    BUN 9 09/15/2016 09:20 AM    Creatinine 0.63 (L) 09/15/2016 09:20 AM    GFR est AA >60 09/15/2016 09:20 AM    GFR est non-AA >60 09/15/2016 09:20 AM    Calcium 9.3 09/15/2016 09:20 AM     Lab Results   Component Value Date/Time    AST (SGOT) 20 09/15/2016 09:20 AM    Alk. phosphatase 111 09/15/2016 09:20 AM    Protein, total 7.8 09/15/2016 09:20 AM    Albumin 3.9 09/15/2016 09:20 AM    Globulin 3.9 09/15/2016 09:20 AM    A-G Ratio 1.0 (L) 09/15/2016 09:20 AM     IMAGING  PET 04/28/2016  IMPRESSION:   1. There is supraclavicular adenopathy left greater than right with increased  metabolic activity. There is retroperitoneal and bilateral iliac adenopathy with  increased metabolic activity and findings are suspicious for metastatic disease.  There is also mediastinal and hilar adenopathy which demonstrates only slight  increased metabolic activity could be metastatic or possibly reactive.  2. There is left hydroureteronephrosis with a left ureter dilated to mid pelvis  suspicious for obstruction.  3. There is bony metastatic disease. There is soft tissue in the paravertebral  region adjacent to C7 suspicious for soft tissue extension.   4. Prostate gland is enlarged and there is increased block activity in the  prostate gland anteriorly which could be related to primary neoplasm or possibly  prostatitis  5. There is a right lower lobe pulmonary mass with increased metabolic activity  of 4.2 which could represent a primary or metastatic lesion.. There are multiple  other masses which are smaller which are demonstrating SUV of 2 or less may  represent metastatic lesions but less metabolic activity related to small size.   There is interstitial prominence and peribronchial thickening nonspecific but  could represent lymphangitic spread.     MRI Brain 04/23/16 - Small acute lacunar infarct in the superior posterior right frontal lobe.     CTA 04/23/16  IMPRESSION:   1. No pulmonary embolus.  2. Bilateral lung nodules.  3. Mediastinal and bilateral hilar adenopathy.  4. Peribronchial disease and interstitial lung disease.  5. Atherosclerotic aorta with coronary  artery calcification.  6. Persistent left nephrogram with hydronephrosis.    MRI spine 04/29/16    1. Numerous metastatic lesions seen in the visualized portions of the thoracic  spine and in the lumbar spine, as well as in the pelvis. Lesion of the T12 level  minimally encroaches epidural space of the left lateral recess without causing  significant spinal canal stenosis. Lesion on the right at the S1-S2 level may  also encroach minimally on the neural foramen.  2. Mild multilevel degenerative disc disease and degenerative changes.   3. Extensive retroperitoneal lymphadenopathy noted. Left-sided hydronephrosis  likely caused by lymphadenopathy.    Supraclavicular node   CYTOLOGIC INTERPRETATION:   Adenocarcinoma, consistent with prostate primary   See comment   General Categorization   Positive for malignancy.   Specimen Adequacy   Satisfactory for evaluation.   Comment   Touch preps and a core biopsy are examined and show islands of adenocarcinoma surrounded by fibrous stroma. A panel of immunohistochemical stains was performed to evaluate site of origin. The tumor cells are focally positive for PSA and PSAP and negative for CK7, CK20, TTF-1, CDX-2 and Napsin A. Morphology and immunoprofile are most consistent with a metastatic prostatic adenocarcinoma.    ASSESSMENT  Mr. Epple is a 66 y.o. male with newly diagnosed widely metastatic prostate adenocarcinoma who presented with, cancer associated DVT and acute lacunar infarct.     PLAN    Prostate cancer   Castrate sensitive, treatment na??ve widely metastatic prostate cancer to lymph nodes above and below the diaphragm and bones.  Prostate adenocarcinoma diagnosed on biopsy of the supraclavicular lymph node.  Discussed in tumor board and a small cell component has been ruled out.  PSA at the time of diagnosis at 2400.     We discussed palliative treatments with the goal of controlling continued spread of disease, improving symptoms, prolonging life.  The mainstay of palliative treatment for metastatic castrate sensitive disease is androgen deprivation therapy.  With his high bone disease we also reviewed up front docetaxel versus  Abiraterone in light of improved disease control and overall survival data from multiple large phase 3 trials (CHAARTED, STAMPEDE, LATTITUDE). Abiraterone and Docetaxel have not been compared head to head. Following previous discussions patient has decided on treatment with Zytiga.     He received Firmagon on 05/26/16. Switched to Lupron 45 mg every 6 months, next dose on 01/09/2017    Prednisone 31m daily with Zytiga. Continue Zytiga daily.     CBC and CMP with PSA monthly. PSA with dramatic response to therapy down to 1.2 on 09/15/16. Labs due again today.     Plan for labs now, labs in April, then CT scans for disease response in May.     Bone metastases  Diffuse  Concerning lesions at C7, T12, S1, S2 which threaten the integrity of the spinal column  Completed palliative radiation that was initiated on 06/09/16    Reviewed results of recent MRI that show response to therapy.     Continue Prednisone 530mdaily.   Hold on biphosphonates due to poor dental hygiene.     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     Tooth decay  Need to obtain records from visit 10/16/15 to address poor dentition in preparation for bisphosphonate therapy.      Follow up in 2 months with labs.      Patient was seen with  [name] NP    Juliene Pina, MD

## 2016-11-11 ENCOUNTER — Inpatient Hospital Stay: Admit: 2016-11-11 | Payer: Charity | Primary: Internal Medicine

## 2016-11-11 LAB — METABOLIC PANEL, COMPREHENSIVE
A-G Ratio: 1 — ABNORMAL LOW (ref 1.1–2.2)
ALT (SGPT): 9 U/L — ABNORMAL LOW (ref 12–78)
AST (SGOT): 18 U/L (ref 15–37)
Albumin: 3.7 g/dL (ref 3.5–5.0)
Alk. phosphatase: 83 U/L (ref 45–117)
Anion gap: 7 mmol/L (ref 5–15)
BUN/Creatinine ratio: 19 (ref 12–20)
BUN: 11 MG/DL (ref 6–20)
Bilirubin, total: 0.4 MG/DL (ref 0.2–1.0)
CO2: 28 mmol/L (ref 21–32)
Calcium: 9.4 MG/DL (ref 8.5–10.1)
Chloride: 106 mmol/L (ref 97–108)
Creatinine: 0.58 MG/DL — ABNORMAL LOW (ref 0.70–1.30)
GFR est AA: >60 mL/min/{1.73_m2} (ref >60)
GFR est non-AA: >60 mL/min/{1.73_m2} (ref >60)
Globulin: 3.7 g/dL (ref 2.0–4.0)
Glucose: 79 mg/dL (ref 65–100)
Potassium: 3.9 mmol/L (ref 3.5–5.1)
Protein, total: 7.4 g/dL (ref 6.4–8.2)
Sodium: 141 mmol/L (ref 136–145)

## 2016-11-11 LAB — CBC WITH AUTOMATED DIFF
ABS. BASOPHILS: 0 10*3/uL (ref 0.0–0.1)
ABS. EOSINOPHILS: 0.2 10*3/uL (ref 0.0–0.4)
ABS. IMM. GRANS.: 0 10*3/uL (ref 0.00–0.04)
ABS. LYMPHOCYTES: 1 10*3/uL (ref 0.8–3.5)
ABS. MONOCYTES: 0.4 10*3/uL (ref 0.0–1.0)
ABS. NEUTROPHILS: 2.6 10*3/uL (ref 1.8–8.0)
ABSOLUTE NRBC: 0 10*3/uL (ref 0.00–0.01)
BASOPHILS: 1 % (ref 0–1)
EOSINOPHILS: 4 % (ref 0–7)
HCT: 37.5 % (ref 36.6–50.3)
HGB: 12.5 g/dL (ref 12.1–17.0)
IMMATURE GRANULOCYTES: 0 % (ref 0.0–0.5)
LYMPHOCYTES: 24 % (ref 12–49)
MCH: 30.8 PG (ref 26.0–34.0)
MCHC: 33.3 g/dL (ref 30.0–36.5)
MCV: 92.4 FL (ref 80.0–99.0)
MONOCYTES: 10 % (ref 5–13)
MPV: 10 FL (ref 8.9–12.9)
NEUTROPHILS: 62 % (ref 32–75)
NRBC: 0 PER 100 WBC
PLATELET: 189 10*3/uL (ref 150–400)
RBC: 4.06 M/uL — ABNORMAL LOW (ref 4.10–5.70)
RDW: 11.9 % (ref 11.5–14.5)
WBC: 4.3 10*3/uL (ref 4.1–11.1)

## 2016-11-12 LAB — PSA, DIAGNOSTIC (PROSTATE SPECIFIC AG): Prostate Specific Ag: 0.2 ng/mL (ref 0.0–4.0)

## 2016-11-14 ENCOUNTER — Encounter: Payer: Charity | Primary: Internal Medicine

## 2016-11-18 ENCOUNTER — Encounter

## 2016-11-24 ENCOUNTER — Encounter: Payer: Charity | Primary: Internal Medicine

## 2016-11-24 ENCOUNTER — Encounter

## 2016-12-12 ENCOUNTER — Encounter: Payer: Charity | Primary: Internal Medicine

## 2016-12-16 ENCOUNTER — Inpatient Hospital Stay: Admit: 2016-12-16 | Payer: Charity | Primary: Internal Medicine

## 2016-12-16 LAB — CBC WITH AUTOMATED DIFF
ABS. BASOPHILS: 0 10*3/uL (ref 0.0–0.1)
ABS. EOSINOPHILS: 0.2 10*3/uL (ref 0.0–0.4)
ABS. IMM. GRANS.: 0 10*3/uL (ref 0.00–0.04)
ABS. LYMPHOCYTES: 1 10*3/uL (ref 0.8–3.5)
ABS. MONOCYTES: 0.5 10*3/uL (ref 0.0–1.0)
ABS. NEUTROPHILS: 2.7 10*3/uL (ref 1.8–8.0)
ABSOLUTE NRBC: 0 10*3/uL (ref 0.00–0.01)
BASOPHILS: 0 % (ref 0–1)
EOSINOPHILS: 4 % (ref 0–7)
HCT: 41.9 % (ref 36.6–50.3)
HGB: 13.3 g/dL (ref 12.1–17.0)
IMMATURE GRANULOCYTES: 0 % (ref 0.0–0.5)
LYMPHOCYTES: 23 % (ref 12–49)
MCH: 30.1 PG (ref 26.0–34.0)
MCHC: 31.7 g/dL (ref 30.0–36.5)
MCV: 94.8 FL (ref 80.0–99.0)
MONOCYTES: 12 % (ref 5–13)
MPV: 10 FL (ref 8.9–12.9)
NEUTROPHILS: 60 % (ref 32–75)
NRBC: 0 PER 100 WBC
PLATELET: 174 10*3/uL (ref 150–400)
RBC: 4.42 M/uL (ref 4.10–5.70)
RDW: 13 % (ref 11.5–14.5)
WBC: 4.4 10*3/uL (ref 4.1–11.1)

## 2016-12-16 LAB — METABOLIC PANEL, COMPREHENSIVE
A-G Ratio: 1.2 (ref 1.1–2.2)
ALT (SGPT): 12 U/L (ref 12–78)
AST (SGOT): 14 U/L — ABNORMAL LOW (ref 15–37)
Albumin: 4.4 g/dL (ref 3.5–5.0)
Alk. phosphatase: 88 U/L (ref 45–117)
Anion gap: 8 mmol/L (ref 5–15)
BUN/Creatinine ratio: 18 (ref 12–20)
BUN: 12 MG/DL (ref 6–20)
Bilirubin, total: 0.4 MG/DL (ref 0.2–1.0)
CO2: 27 mmol/L (ref 21–32)
Calcium: 9.4 MG/DL (ref 8.5–10.1)
Chloride: 107 mmol/L (ref 97–108)
Creatinine: 0.68 MG/DL — ABNORMAL LOW (ref 0.70–1.30)
GFR est AA: >60 mL/min/{1.73_m2} (ref >60)
GFR est non-AA: >60 mL/min/{1.73_m2} (ref >60)
Globulin: 3.7 g/dL (ref 2.0–4.0)
Glucose: 78 mg/dL (ref 65–100)
Potassium: 4.4 mmol/L (ref 3.5–5.1)
Protein, total: 8.1 g/dL (ref 6.4–8.2)
Sodium: 142 mmol/L (ref 136–145)

## 2016-12-22 ENCOUNTER — Inpatient Hospital Stay: Admit: 2016-12-22 | Payer: Charity | Primary: Internal Medicine

## 2016-12-22 ENCOUNTER — Ambulatory Visit

## 2016-12-22 DIAGNOSIS — C61 Malignant neoplasm of prostate: Secondary | ICD-10-CM

## 2016-12-22 MED ORDER — SODIUM CHLORIDE 0.9% BOLUS IV
0.9 % | Freq: Once | INTRAVENOUS | Status: AC
Start: 2016-12-22 — End: 2016-12-22
  Administered 2016-12-22: 16:00:00 via INTRAVENOUS

## 2016-12-22 MED ORDER — IOPAMIDOL 76 % IV SOLN
370 mg iodine /mL (76 %) | Freq: Once | INTRAVENOUS | Status: AC
Start: 2016-12-22 — End: 2016-12-22
  Administered 2016-12-22: 16:00:00 via INTRAVENOUS

## 2016-12-22 MED ORDER — IOHEXOL 240 MG/ML IV SOLN
240 mg iodine/mL | Freq: Once | INTRAVENOUS | Status: AC
Start: 2016-12-22 — End: 2016-12-22
  Administered 2016-12-22: 16:00:00 via ORAL

## 2016-12-22 MED ORDER — SODIUM CHLORIDE 0.9 % IJ SYRG
Freq: Once | INTRAMUSCULAR | Status: AC
Start: 2016-12-22 — End: 2016-12-22
  Administered 2016-12-22: 16:00:00 via INTRAVENOUS

## 2016-12-22 MED FILL — SODIUM CHLORIDE 0.9 % IV: INTRAVENOUS | Qty: 100

## 2016-12-22 MED FILL — OMNIPAQUE 240 MG IODINE/ML INTRAVENOUS SOLUTION: 240 mg iodine/mL | INTRAVENOUS | Qty: 50

## 2016-12-22 MED FILL — ISOVUE-370  76 % INTRAVENOUS SOLUTION: 370 mg iodine /mL (76 %) | INTRAVENOUS | Qty: 100

## 2016-12-22 MED FILL — NORMAL SALINE FLUSH 0.9 % INJECTION SYRINGE: INTRAMUSCULAR | Qty: 10

## 2016-12-24 NOTE — Telephone Encounter (Signed)
Call to [facility]/Katie.  Next appointment date with patient is 6/25.    I advised we would follow up with patient for a CT scan visit as scheduled on 01/02/17

## 2016-12-24 NOTE — Telephone Encounter (Signed)
Katie from [facility] called on patient's behalf requesting a return call from [place] before lunch. She stated patient had CT scans done two days ago and there are worsening results. She would like to know who is going to call the patient with the results and who is going to see him first. Please return call to discuss [phone].  rah

## 2016-12-29 MED ORDER — APIXABAN 5 MG TABLET
5 mg | ORAL_TABLET | Freq: Two times a day (BID) | ORAL | 6 refills | Status: DC
Start: 2016-12-29 — End: 2016-12-30

## 2016-12-29 NOTE — Telephone Encounter (Signed)
[name] called and stated that the patient will need a new prescription faxed to [facility] at [phone], for his eliquis. Patient only has 1 pill left. CB# [phone]

## 2016-12-29 NOTE — Telephone Encounter (Signed)
Pt only has one pill left and a new script fax to [phone],  Phone number to [phone]. Pt friend wants a call been this has been completely

## 2016-12-29 NOTE — Telephone Encounter (Signed)
Call to [facility], patient would be liable for $405 for month supply of prescription, 5 day supply is $85.     [facility] needs a signed new prescription faxed to them to send out drug, this will need to be completed by Dr. Melony Overly upon return to office.     Patient is not eligible to use co pay assist card as he does not have insurance and has already utilized one time offer for un insured patients.    Call to [facility] to update on status and provide options until prescription can be sent to [facility]. Message left to return call.

## 2016-12-30 MED ORDER — APIXABAN 5 MG TABLET
5 mg | ORAL_TABLET | Freq: Two times a day (BID) | ORAL | 6 refills | Status: DC
Start: 2016-12-30 — End: 2017-01-01

## 2016-12-30 NOTE — Telephone Encounter (Signed)
Call received from [facility] [place] verified. Advised of information, will send in prescription to [facility] for patient today. Advised could pick up temporary 5 day supply at cost on $85 dollars at our pharmacy, [facility], she will come by pharmacy today to pick up to allow time for [facility] to ship medication. States will update office more timely when future refills needed. Thanked for assistance.

## 2017-01-01 MED ORDER — APIXABAN 5 MG TABLET
5 mg | ORAL_TABLET | Freq: Two times a day (BID) | ORAL | 0 refills | Status: DC
Start: 2017-01-01 — End: 2017-01-26

## 2017-01-01 NOTE — Telephone Encounter (Signed)
Call to [facility] to check status of prescription fill, number for [facility] has been disconnected. Call to business office, [phone]. Spoke with Earnestine Mealing. They have temporarily disconnected all numbers for Patient Assistance Program and you cannot contact them directly, they are working on this. He will forward my call to check status of patients prescription to the appropriate team and expedite request. In the meantime, patient can be given 30 day voucher from their company for the inconvenience. Needs to be sent to chain pharmacy.     Call to [name] to update on status, provider updated, prescription sent to [facility] on [address] per request, voucher information given to pharmacy, may receive 30 day supply at no cost to patient.   Await return call from [facility] with status.     (total time involved today 55 min)

## 2017-01-02 ENCOUNTER — Encounter: Attending: Family | Primary: Internal Medicine

## 2017-01-05 MED ORDER — LEUPROLIDE (6 MONTH) 45 MG IM SYRINGE KIT
45 mg | Freq: Once | INTRAMUSCULAR | Status: AC
Start: 2017-01-05 — End: 2017-01-09
  Administered 2017-01-09: 13:00:00 via INTRAMUSCULAR

## 2017-01-08 ENCOUNTER — Encounter: Attending: Family | Primary: Internal Medicine

## 2017-01-09 ENCOUNTER — Inpatient Hospital Stay: Admit: 2017-01-09 | Payer: Charity | Primary: Internal Medicine

## 2017-01-09 ENCOUNTER — Ambulatory Visit: Admit: 2017-01-09 | Discharge: 2017-01-09 | Attending: Family | Primary: Internal Medicine

## 2017-01-09 DIAGNOSIS — C61 Malignant neoplasm of prostate: Secondary | ICD-10-CM

## 2017-01-09 DIAGNOSIS — Z5111 Encounter for antineoplastic chemotherapy: Secondary | ICD-10-CM

## 2017-01-09 LAB — CBC WITH AUTOMATED DIFF
ABS. BASOPHILS: 0 10*3/uL (ref 0.0–0.1)
ABS. EOSINOPHILS: 0.1 10*3/uL (ref 0.0–0.4)
ABS. IMM. GRANS.: 0 10*3/uL (ref 0.00–0.04)
ABS. LYMPHOCYTES: 1 10*3/uL (ref 0.8–3.5)
ABS. MONOCYTES: 0.6 10*3/uL (ref 0.0–1.0)
ABS. NEUTROPHILS: 2.4 10*3/uL (ref 1.8–8.0)
ABSOLUTE NRBC: 0 10*3/uL (ref 0.00–0.01)
BASOPHILS: 0 % (ref 0–1)
EOSINOPHILS: 3 % (ref 0–7)
HCT: 34 % — ABNORMAL LOW (ref 36.6–50.3)
HGB: 11.4 g/dL — ABNORMAL LOW (ref 12.1–17.0)
IMMATURE GRANULOCYTES: 1 % — ABNORMAL HIGH (ref 0.0–0.5)
LYMPHOCYTES: 24 % (ref 12–49)
MCH: 30.2 PG (ref 26.0–34.0)
MCHC: 33.5 g/dL (ref 30.0–36.5)
MCV: 89.9 FL (ref 80.0–99.0)
MONOCYTES: 14 % — ABNORMAL HIGH (ref 5–13)
MPV: 9.6 FL (ref 8.9–12.9)
NEUTROPHILS: 58 % (ref 32–75)
NRBC: 0 PER 100 WBC
PLATELET: 159 10*3/uL (ref 150–400)
RBC: 3.78 M/uL — ABNORMAL LOW (ref 4.10–5.70)
RDW: 12.9 % (ref 11.5–14.5)
WBC: 4.1 10*3/uL (ref 4.1–11.1)

## 2017-01-09 LAB — METABOLIC PANEL, COMPREHENSIVE
A-G Ratio: 1 — ABNORMAL LOW (ref 1.1–2.2)
ALT (SGPT): 10 U/L — ABNORMAL LOW (ref 12–78)
AST (SGOT): 12 U/L — ABNORMAL LOW (ref 15–37)
Albumin: 3.8 g/dL (ref 3.5–5.0)
Alk. phosphatase: 80 U/L (ref 45–117)
Anion gap: 6 mmol/L (ref 5–15)
BUN/Creatinine ratio: 16 (ref 12–20)
BUN: 9 MG/DL (ref 6–20)
Bilirubin, total: 0.4 MG/DL (ref 0.2–1.0)
CO2: 29 mmol/L (ref 21–32)
Calcium: 9.1 MG/DL (ref 8.5–10.1)
Chloride: 109 mmol/L — ABNORMAL HIGH (ref 97–108)
Creatinine: 0.55 MG/DL — ABNORMAL LOW (ref 0.70–1.30)
GFR est AA: >60 mL/min/{1.73_m2} (ref >60)
GFR est non-AA: >60 mL/min/{1.73_m2} (ref >60)
Globulin: 3.7 g/dL (ref 2.0–4.0)
Glucose: 74 mg/dL (ref 65–100)
Potassium: 3.5 mmol/L (ref 3.5–5.1)
Protein, total: 7.5 g/dL (ref 6.4–8.2)
Sodium: 144 mmol/L (ref 136–145)

## 2017-01-09 LAB — PSA, DIAGNOSTIC (PROSTATE SPECIFIC AG): Prostate Specific Ag: 0.1 ng/mL (ref 0.01–4.00)

## 2017-01-09 MED FILL — LUPRON DEPOT 45 MG (6 MONTH) INTRAMUSCULAR SYRINGE KIT: 45 mg | INTRAMUSCULAR | Qty: 1

## 2017-01-09 NOTE — Progress Notes (Signed)
Hematology/Oncology Progress Note    REASON FOR VISIT: Metastatic castrate sensitive prostate cancer    HISTORY OF PRESENT ILLNESS: [name] is a 66 y.o. male who is on Eliquis for a h/o DVT who presented to the Emergency Department on 04/22/16 with nausea, vomiting, and abdominal pain x 2 months, decreased appetite and weight loss of 10-14 lbs.  CTA revealed bilateral lung nodules and mediastinal/hilar adenopathy. PET revealed supraclavicular adenopathy, retroperitoneal adenopathy, bilateral iliac adenopathy, mediastinal/hilar adenopathy, bony metastatic disease, right lower lobe pulmonary mass, and multiple other smaller masses in bilateral lungs. USG guided bx of supraclavicular LN showed adenocarcinoma of the prostate. He was also diagnosed and treated for CAP. PSA on 05/26/16-2400. Was discussed in tumor board, Napsin A negative and a small cell component ruled out. Initiated Degarelix  05/26/16. MRI spine with expansile C7 lesion, T12 lesion encroaching epidural space, S1-S2 lesion encroaching neural foramina. Started palliative C6-T5 and T11-L5 XRT under Dr. Leron Croak on 06/09/16    Family is estranged, comes with a friend, he lives with her- Bruce Clark.    Patient presents for follow up. Started Zytiga 08/28/16.     Feeling well overall. Vomited once about 2 weeks ago, took medications on an empty stomach and feels that this was the issue. Eating and drinking well. No pain. Energy has been great. Walking around a lot, about a mile a day. No nausea. Bowels are moving well.     Otherwise, complete ROS is per the symptom report form which has been scanned into the media section of the electronic medical record.    No past medical history on file.    Past Surgical History:   Procedure Laterality Date   ??? HX HERNIA REPAIR         No Known Allergies    Current Outpatient Prescriptions   Medication Sig Dispense Refill   ??? apixaban (ELIQUIS) 5 mg tablet Take 1 Tab by mouth two (2) times a day.  Indications: deep venous thrombosis 60 Tab 0   ??? predniSONE (DELTASONE) 5 mg tablet Take 1 Tab by mouth daily. 30 Tab 0   ??? abiraterone (ZYTIGA) 500 mg tab Take 1,000 mg by mouth daily. 60 Cap 6       Social History     Social History   ??? Marital status: SINGLE     Spouse name: N/A   ??? Number of children: N/A   ??? Years of education: N/A     Social History Main Topics   ??? Smoking status: Former Smoker   ??? Smokeless tobacco: Never Used   ??? Alcohol use No   ??? Drug use: None   ??? Sexual activity: Not Asked     Other Topics Concern   ??? None     Social History Narrative       No family history on file.    ROS  A 12 point review of systems was obtained and is negative except as listed in HPI.  ECOG PS is 1    Physical Examination:   Visit Vitals   ??? BP 167/77   ??? Pulse (!) 53   ??? Temp 97.9 ??F (36.6 ??C)   ??? Resp 16   ??? Ht 5' 7"  (1.702 m)   ??? Wt 154 lb 9.6 oz (70.1 kg)   ??? SpO2 98%   ??? BMI 24.21 kg/m2     General appearance - alert, frail appearing, and in no distress  Poor historian   Mental status - oriented  to person, place, and time  Mouth - mucous membranes moist  Neck - supple, no significant adenopathy  Chest - clear to auscultation, no wheezes, rales or rhonchi, symmetric air entry  Heart - normal rate, regular rhythm, normal S1, S2, no murmurs, rubs, clicks or gallops  Abdomen - soft, nontender, nondistended, bowel sounds present  Neurological - normal speech, no focal findings or movement disorder noted  Musculoskeletal - no joint tenderness, deformity or swelling  Extremities - peripheral pulses normal, no pedal edema, no clubbing or cyanosis  Skin - normal coloration and turgor, no rashes, no suspicious skin lesions noted    LABS  Lab Results   Component Value Date/Time    WBC 4.4 12/15/2016 09:10 AM    HGB 13.3 12/15/2016 09:10 AM    HCT 41.9 12/15/2016 09:10 AM    PLATELET 174 12/15/2016 09:10 AM    MCV 94.8 12/15/2016 09:10 AM    ABS. NEUTROPHILS 2.7 12/15/2016 09:10 AM     Lab Results    Component Value Date/Time    Sodium 142 12/15/2016 09:10 AM    Potassium 4.4 12/15/2016 09:10 AM    Chloride 107 12/15/2016 09:10 AM    CO2 27 12/15/2016 09:10 AM    Glucose 78 12/15/2016 09:10 AM    BUN 12 12/15/2016 09:10 AM    Creatinine 0.68 (L) 12/15/2016 09:10 AM    GFR est AA >60 12/15/2016 09:10 AM    GFR est non-AA >60 12/15/2016 09:10 AM    Calcium 9.4 12/15/2016 09:10 AM     Lab Results   Component Value Date/Time    AST (SGOT) 14 (L) 12/15/2016 09:10 AM    Alk. phosphatase 88 12/15/2016 09:10 AM    Protein, total 8.1 12/15/2016 09:10 AM    Albumin 4.4 12/15/2016 09:10 AM    Globulin 3.7 12/15/2016 09:10 AM    A-G Ratio 1.2 12/15/2016 09:10 AM     IMAGING  CT chest/abd/pelvis on 12/22/16 "    IMPRESSION:  ??  1. Spiculated right lower lobe lung mass has grown compared to prior exams and  again may reflect metastatic disease or second pulmonary primary.  ??  2. Interval reduction in extensive retroperitoneal adenopathy, other lung  lesions, prostate size compatible with response to therapy.    PET 04/28/2016  IMPRESSION:   1. There is supraclavicular adenopathy left greater than right with increased  metabolic activity. There is retroperitoneal and bilateral iliac adenopathy with  increased metabolic activity and findings are suspicious for metastatic disease.  There is also mediastinal and hilar adenopathy which demonstrates only slight  increased metabolic activity could be metastatic or possibly reactive.  2. There is left hydroureteronephrosis with a left ureter dilated to mid pelvis  suspicious for obstruction.  3. There is bony metastatic disease. There is soft tissue in the paravertebral  region adjacent to C7 suspicious for soft tissue extension.   4. Prostate gland is enlarged and there is increased block activity in the  prostate gland anteriorly which could be related to primary neoplasm or possibly  prostatitis  5. There is a right lower lobe pulmonary mass with increased metabolic activity   of 4.2 which could represent a primary or metastatic lesion.. There are multiple  other masses which are smaller which are demonstrating SUV of 2 or less may  represent metastatic lesions but less metabolic activity related to small size.  There is interstitial prominence and peribronchial thickening nonspecific but  could represent lymphangitic spread.  MRI Brain 04/23/16 - Small acute lacunar infarct in the superior posterior right frontal lobe.     CTA 04/23/16  IMPRESSION:   1. No pulmonary embolus.  2. Bilateral lung nodules.  3. Mediastinal and bilateral hilar adenopathy.  4. Peribronchial disease and interstitial lung disease.  5. Atherosclerotic aorta with coronary artery calcification.  6. Persistent left nephrogram with hydronephrosis.    MRI spine 04/29/16    1. Numerous metastatic lesions seen in the visualized portions of the thoracic  spine and in the lumbar spine, as well as in the pelvis. Lesion of the T12 level  minimally encroaches epidural space of the left lateral recess without causing  significant spinal canal stenosis. Lesion on the right at the S1-S2 level may  also encroach minimally on the neural foramen.  2. Mild multilevel degenerative disc disease and degenerative changes.   3. Extensive retroperitoneal lymphadenopathy noted. Left-sided hydronephrosis  likely caused by lymphadenopathy.    Supraclavicular node   CYTOLOGIC INTERPRETATION:   Adenocarcinoma, consistent with prostate primary   See comment   General Categorization   Positive for malignancy.   Specimen Adequacy   Satisfactory for evaluation.   Comment   Touch preps and a core biopsy are examined and show islands of adenocarcinoma surrounded by fibrous stroma. A panel of immunohistochemical stains was performed to evaluate site of origin. The tumor cells are focally positive for PSA and PSAP and negative for CK7, CK20, TTF-1, CDX-2 and Napsin A. Morphology and immunoprofile are most  consistent with a metastatic prostatic adenocarcinoma.    ASSESSMENT  Bruce Clark is a 66 y.o. male with newly diagnosed widely metastatic prostate adenocarcinoma who presented with, cancer associated DVT and acute lacunar infarct.     PLAN    Prostate cancer  Castrate sensitive, treatment na??ve widely metastatic prostate cancer to lymph nodes above and below the diaphragm and bones.  Prostate adenocarcinoma diagnosed on biopsy of the supraclavicular lymph node.  Discussed in tumor board and a small cell component has been ruled out.  PSA at the time of diagnosis at 2400.     We discussed palliative treatments with the goal of controlling continued spread of disease, improving symptoms, prolonging life.  The mainstay of palliative treatment for metastatic castrate sensitive disease is androgen deprivation therapy.  With his high bone disease we also reviewed up front docetaxel versus  Abiraterone in light of improved disease control and overall survival data from multiple large phase 3 trials (CHAARTED, STAMPEDE, LATTITUDE). Abiraterone and Docetaxel have not been compared head to head. Following previous discussions patient has decided on treatment with Zytiga.     He received Firmagon on 05/26/16. Switched to Lupron 45 mg every 6 months, given today.     Prednisone 68m daily with Zytiga. Continue Zytiga daily.     CBC and CMP with PSA monthly. PSA with dramatic response to therapy, recheck today.     Discussed CT results in detail. Prostate cancer is responding to therapy with decrease in PSA, resolution of pain and adenopathy. Growing lung nodule. This may represent new primary. Need biopsy to assess. Plan for this next week, follow up with results.     Bone metastases  Diffuse  Concerning lesions at C7, T12, S1, S2 which threaten the integrity of the spinal column  Completed palliative radiation that was initiated on 06/09/16    Continue Prednisone 526mdaily.    Hold on biphosphonates due to poor dental hygiene.     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     Discussed he will need to stop Eliquis one day prior and day of lung biopsy    Follow up in 2 weeks with pathology results.     Patient was seen with [name] NP    Juliene Pina, MD

## 2017-01-09 NOTE — Progress Notes (Signed)
Problem: Knowledge Deficit  Goal: *Verbalizes understanding of procedures and medications  Outcome: Progressing Towards Goal  Lupron q 6 months

## 2017-01-09 NOTE — Progress Notes (Signed)
Outpatient Infusion Center Progress Note    0820 Pt admit to [facility] for Lupron injection. Pt ambulatory in stable condition. Assessment completed. No new concerns voiced.    Visit Vitals   ??? BP 155/82 (BP 1 Location: Right arm, BP Patient Position: Sitting)   ??? Pulse 63   ??? Temp 98.6 ??F (37 ??C)   ??? Resp 18       Medications:  Lupron-IM-right deltoid    0915 Pt tolerated treatment well. D/c home ambulatory in no distress. Pt aware of next appointment scheduled for 07/10/17 at 0830.

## 2017-01-09 NOTE — Progress Notes (Signed)
Bruce Clark is a 66 y.o. male here today for follow up, prostate cancer.

## 2017-01-23 ENCOUNTER — Ambulatory Visit: Admit: 2017-01-23 | Discharge: 2017-01-23 | Attending: Family | Primary: Internal Medicine

## 2017-01-23 DIAGNOSIS — C61 Malignant neoplasm of prostate: Secondary | ICD-10-CM

## 2017-01-23 NOTE — Progress Notes (Deleted)
Bruce Clark is a 66 y.o. male here today for follow up, prostate cancer.

## 2017-01-23 NOTE — Telephone Encounter (Signed)
Call to [place] to check on status of patient Eliquis. Updated prescription faxed complete on 12/20/16. Patient filled free 30 day supply on 01/01/17. Call wait time 1 hr 54min, no answer. Call disconnected.     Call to [name], updated that she will receive a call from scheduling regarding patient CT bx, also inquired if they had heard from [facility] regarding shipment of Eliquis. They have not. Encouraged to call number as it is back in service. Verbalized understanding. Advised heavy call volume and wait times, she will call at start of day. Thanked for update.

## 2017-01-23 NOTE — Telephone Encounter (Signed)
Call to scheduling to set up CT lung bx.  Unable to contact.  Patient provided scheduling phone number to make appointment.

## 2017-01-26 MED ORDER — APIXABAN 5 MG TABLET
5 mg | ORAL_TABLET | Freq: Two times a day (BID) | ORAL | 6 refills | Status: DC
Start: 2017-01-26 — End: 2017-04-27

## 2017-01-26 NOTE — Addendum Note (Signed)
Addended by: [name] T on: 01/26/2017 06:53 PM      Modules accepted: Orders

## 2017-01-26 NOTE — Telephone Encounter (Addendum)
Call to [facility].  [phone]  On hold for 1 hour and  7 minutes.    Spoke with Carmell Austria.  Patient is still active in system and has approval for eliquis through 05/2017.  They do not have a current prescription on file.  They require another script to be faxed to [phone].  Please allow 7-10 business days to process.      She confirmed there are no other phone numbers available except for the ones listed above.

## 2017-01-27 NOTE — Telephone Encounter (Signed)
Faxed eliquis script to [facility].  Confirmation rec'd.

## 2017-01-27 NOTE — Telephone Encounter (Signed)
Per [name] lung bx scheduled for:  02/02/17/[facility]  Arrive OP Reg 9a for 11a appt  NPO after MN  Stop Eliquis after Fridays dose  Needs driver  Per [name] has not contacted patient and was told our office would notify patient.    Thanked for assist.  Forwarded to primary team

## 2017-01-28 NOTE — Telephone Encounter (Signed)
Call to cell #.  Message left to return call.  Call to work #.  Male stated is nephew [place].  Call to home #.  VM not set up yet on home #.

## 2017-01-28 NOTE — Telephone Encounter (Signed)
Pt wife called to inform pt medication Eliquis will be sent by [facility].  Pt would like a call back to discuss

## 2017-01-29 NOTE — Telephone Encounter (Signed)
See other encounter

## 2017-01-29 NOTE — Telephone Encounter (Signed)
Returned call to [facility].  Left detailed message and requested a return call..    Eliquis to be delivered from [facility] per [place].  He needs to stop eliquis after Fridays dose as.      Lung Bx scheduled 02/02/17 as below.    Per [name] lung bx scheduled for:  02/02/17/[facility]  Arrive OP Reg 9a for 11a appt  NPO after MN  Stop Eliquis after Fridays dose  Needs driver  Per [name] has not contacted patient and was told our office would notify patient.

## 2017-01-30 NOTE — Telephone Encounter (Signed)
Call again to [facility].  To confirm receipt of previous message.   Bruce Clark is bringing patient to 6/18 appointment.  He is aware to stop eliquis after today.    HIPAA verified.

## 2017-02-02 ENCOUNTER — Inpatient Hospital Stay: Admit: 2017-02-02 | Payer: Charity | Attending: Diagnostic Radiology | Primary: Internal Medicine

## 2017-02-02 ENCOUNTER — Inpatient Hospital Stay: Admit: 2017-02-02 | Payer: Charity | Attending: Body Imaging | Primary: Internal Medicine

## 2017-02-02 DIAGNOSIS — C61 Malignant neoplasm of prostate: Secondary | ICD-10-CM

## 2017-02-02 LAB — POC INR: INR (POC): <0.9 (ref <1.2)

## 2017-02-02 MED ORDER — SODIUM CHLORIDE 0.9 % IV
Freq: Once | INTRAVENOUS | Status: AC
Start: 2017-02-02 — End: 2017-02-02
  Administered 2017-02-02: 14:00:00 via INTRAVENOUS

## 2017-02-02 MED ORDER — LIDOCAINE HCL 1 % (10 MG/ML) IJ SOLN
10 mg/mL (1 %) | Freq: Once | INTRAMUSCULAR | Status: AC
Start: 2017-02-02 — End: 2017-02-02
  Administered 2017-02-02: 15:00:00 via INTRADERMAL

## 2017-02-02 MED ORDER — FENTANYL CITRATE (PF) 50 MCG/ML IJ SOLN
50 mcg/mL | INTRAMUSCULAR | Status: AC
Start: 2017-02-02 — End: 2017-02-02
  Administered 2017-02-02: 15:00:00 via INTRAVENOUS

## 2017-02-02 MED ORDER — MIDAZOLAM 1 MG/ML IJ SOLN
1 mg/mL | INTRAMUSCULAR | Status: AC
Start: 2017-02-02 — End: 2017-02-02
  Administered 2017-02-02: 15:00:00 via INTRAVENOUS

## 2017-02-02 MED ORDER — FENTANYL CITRATE (PF) 50 MCG/ML IJ SOLN
50 mcg/mL | INTRAMUSCULAR | Status: DC | PRN
Start: 2017-02-02 — End: 2017-02-02

## 2017-02-02 MED ORDER — MIDAZOLAM 1 MG/ML IJ SOLN
1 mg/mL | INTRAMUSCULAR | Status: DC | PRN
Start: 2017-02-02 — End: 2017-02-02

## 2017-02-02 MED FILL — FENTANYL CITRATE (PF) 50 MCG/ML IJ SOLN: 50 mcg/mL | INTRAMUSCULAR | Qty: 4

## 2017-02-02 MED FILL — MIDAZOLAM 1 MG/ML IJ SOLN: 1 mg/mL | INTRAMUSCULAR | Qty: 5

## 2017-02-02 NOTE — Other (Signed)
Pt. Discharged to home and transported to d/c lot via w/c. Verbalized understanding of d/c instructions from lung bx

## 2017-02-02 NOTE — H&P (Signed)
Interventional Radiology History and Physical (Inpatient)    Patient: Bruce Clark 66 y.o. male     Referring Physician:  Brock Ra, NP    Chief Complaint: No chief complaint on file.      History of Present Illness: conscious sedation (Versed and fentanyl) for lung biopsy    History:  No past medical history on file.  No family history on file.  Social History     Social History   ??? Marital status: SINGLE     Spouse name: N/A   ??? Number of children: N/A   ??? Years of education: N/A     Occupational History   ??? Not on file.     Social History Main Topics   ??? Smoking status: Former Smoker   ??? Smokeless tobacco: Never Used   ??? Alcohol use No   ??? Drug use: Not on file   ??? Sexual activity: Not on file     Other Topics Concern   ??? Not on file     Social History Narrative       Allergies: No Known Allergies    Current Medications:  Current Outpatient Prescriptions   Medication Sig   ??? predniSONE (DELTASONE) 5 mg tablet Take 1 Tab by mouth daily.   ??? abiraterone (ZYTIGA) 500 mg tab Take 1,000 mg by mouth daily.   ??? apixaban (ELIQUIS) 5 mg tablet Take 1 Tab by mouth two (2) times a day. Indications: deep venous thrombosis     Current Facility-Administered Medications   Medication Dose Route Frequency   ??? fentaNYL citrate (PF) injection 200 mcg  200 mcg IntraVENous Multiple   ??? midazolam (VERSED) injection 5 mg  5 mg IntraVENous Multiple   ??? fentaNYL citrate (PF) 50 mcg/mL injection       ??? midazolam (VERSED) 1 mg/mL injection            Physical Exam:  Blood pressure 141/64, pulse 60, temperature 98.5 ??F (36.9 ??C), resp. rate 22, height 5\' 7"  (1.702 Yoshi Vicencio), weight 66.7 kg (147 lb), SpO2 99 %.  GENERAL: alert, cooperative, no distress, appears older than stated age, LUNG: clear to auscultation bilaterally, HEART: regular rate and rhythm    Findings/Diagnosis: conscious sedation (Versed and fentanyl) for lung biopsy    Alerts:    Hospital Problems  Date Reviewed: 01/09/2017    None          Laboratory:      Recent Labs       02/02/17   0940   INR  <0.9         Plan of Care/Planned Procedure:  Risks, benefits, and alternatives reviewed with patient and he agrees to proceed with the procedure.       Full dictated report to follow.

## 2017-02-02 NOTE — Progress Notes (Signed)
Dr.[place] notified that CXR complete and MD stated pt. Is OK for discharge.

## 2017-02-06 ENCOUNTER — Encounter: Primary: Internal Medicine

## 2017-02-17 ENCOUNTER — Encounter: Attending: Internal Medicine | Primary: Internal Medicine

## 2017-02-18 NOTE — Progress Notes (Signed)
He needs to be seen to review scans and management    Audrea Muscat and front desk could you assist?

## 2017-02-18 NOTE — Progress Notes (Signed)
Pt scheduled for 03/04/2017 called his girflfriend states he is out of town until 03/02/2017

## 2017-02-18 NOTE — Progress Notes (Signed)
Thanks corrected it,  pt has been scheduled with Dr Melony Overly on 03/10/2017 at 2

## 2017-03-06 ENCOUNTER — Encounter: Payer: Charity | Primary: Internal Medicine

## 2017-03-10 ENCOUNTER — Ambulatory Visit: Admit: 2017-03-10 | Discharge: 2017-03-10 | Attending: Internal Medicine | Primary: Internal Medicine

## 2017-03-10 DIAGNOSIS — C3491 Malignant neoplasm of unspecified part of right bronchus or lung: Secondary | ICD-10-CM

## 2017-03-10 NOTE — Progress Notes (Signed)
Bruce Clark is a 66 y.o. male here today for follow up, prostate cancer. Needs prednisone refill.

## 2017-03-10 NOTE — Progress Notes (Signed)
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 male who is on Eliquis for a h/o DVT who presented to the Emergency Department on 04/22/16 with nausea, vomiting, and abdominal pain x 2 months, decreased appetite and weight loss of 10-14 lbs.  CTA revealed bilateral lung nodules and mediastinal/hilar adenopathy. PET revealed supraclavicular adenopathy, retroperitoneal adenopathy, bilateral iliac adenopathy, mediastinal/hilar adenopathy, bony metastatic disease, right lower lobe pulmonary mass, and multiple other smaller masses in bilateral lungs. USG guided bx of supraclavicular LN showed adenocarcinoma of the prostate. He was also diagnosed and treated for CAP. PSA on 05/26/16-2400. Was discussed in tumor board, Napsin A negative and a small cell component ruled out. Initiated Degarelix  05/26/16. MRI spine with expansile C7 lesion, T12 lesion encroaching epidural space, S1-S2 lesion encroaching neural foramina. Started palliative C6-T5 and T11-L5 XRT under Dr. Leron Croak on 06/09/16. Started Zytiga 08/28/16 with evidence of response. Noted to have a  Left lower lobe lung nodule on CT done 12/22/16. Biopsy consistent with Lung adenocarcinoma. He couldn't follow up due to transportation issues and now comes to discuss scans     Feeling well overall. Eating and drinking well. No pain. Energy has been great.  No nausea. Bowels are moving well. No SOB or cough    Otherwise, complete ROS is per the symptom report form which has been scanned into the media section of the electronic medical record.    No past medical history on file.    Past Surgical History:   Procedure Laterality Date   ??? HX HERNIA REPAIR         No Known Allergies    Current Outpatient Prescriptions   Medication Sig Dispense Refill   ??? apixaban (ELIQUIS) 5 mg tablet Take 1 Tab by mouth two (2) times a day.  Indications: deep venous thrombosis 60 Tab 6   ??? predniSONE (DELTASONE) 5 mg tablet Take 1 Tab by mouth daily. 30 Tab 0   ??? abiraterone (ZYTIGA) 500 mg tab Take 1,000 mg by mouth daily. 60 Cap 6       Social History     Social History   ??? Marital status: SINGLE     Spouse name: N/A   ??? Number of children: N/A   ??? Years of education: N/A     Social History Main Topics   ??? Smoking status: Former Smoker   ??? Smokeless tobacco: Never Used   ??? Alcohol use No   ??? Drug use: None   ??? Sexual activity: Not Asked     Other Topics Concern   ??? None     Social History Narrative       No family history on file.    ROS  A 12 point review of systems was obtained and is negative except as listed in HPI.  ECOG PS is 1    Physical Examination:   Visit Vitals   ??? BP 132/80   ??? Pulse 69   ??? Temp 99 ??F (37.2 ??C)   ??? Resp 20   ??? Ht 5' 7"  (1.702 m)   ??? Wt 153 lb 12.8 oz (69.8 kg)   ??? SpO2 99%   ??? BMI 24.09 kg/m2     General appearance - alert, frail appearing, and in no distress  Poor historian   Mental status - oriented to person, place, and  time  Mouth - mucous membranes moist  Neck - supple, no significant adenopathy  Neurological - normal speech, no focal findings or movement disorder noted  Musculoskeletal - no joint tenderness, deformity or swelling  Extremities - peripheral pulses normal, no pedal edema, no clubbing or cyanosis  Skin - normal coloration and turgor, no rashes, no suspicious skin lesions noted    LABS  Lab Results   Component Value Date/Time    WBC 4.1 01/09/2017 10:32 AM    HGB 11.4 (L) 01/09/2017 10:32 AM    HCT 34.0 (L) 01/09/2017 10:32 AM    PLATELET 159 01/09/2017 10:32 AM    MCV 89.9 01/09/2017 10:32 AM    ABS. NEUTROPHILS 2.4 01/09/2017 10:32 AM     Lab Results   Component Value Date/Time    Sodium 144 01/09/2017 10:32 AM    Potassium 3.5 01/09/2017 10:32 AM    Chloride 109 (H) 01/09/2017 10:32 AM    CO2 29 01/09/2017 10:32 AM    Glucose 74 01/09/2017 10:32 AM    BUN 9 01/09/2017 10:32 AM     Creatinine 0.55 (L) 01/09/2017 10:32 AM    GFR est AA >60 01/09/2017 10:32 AM    GFR est non-AA >60 01/09/2017 10:32 AM    Calcium 9.1 01/09/2017 10:32 AM     Lab Results   Component Value Date/Time    AST (SGOT) 12 (L) 01/09/2017 10:32 AM    Alk. phosphatase 80 01/09/2017 10:32 AM    Protein, total 7.5 01/09/2017 10:32 AM    Albumin 3.8 01/09/2017 10:32 AM    Globulin 3.7 01/09/2017 10:32 AM    A-G Ratio 1.0 (L) 01/09/2017 10:32 AM     IMAGING  CT chest/abd/pelvis on 12/22/16 "    IMPRESSION:  ??  1. Spiculated right lower lobe lung mass has grown compared to prior exams and  again may reflect metastatic disease or second pulmonary primary.  ??  2. Interval reduction in extensive retroperitoneal adenopathy, other lung  lesions, prostate size compatible with response to therapy.    PATHOLOGY    Supraclavicular node   CYTOLOGIC INTERPRETATION:   Adenocarcinoma, consistent with prostate primary   See comment   General Categorization   Positive for malignancy.   Specimen Adequacy   Satisfactory for evaluation.   Comment   Touch preps and a core biopsy are examined and show islands of adenocarcinoma surrounded by fibrous stroma. A panel of immunohistochemical stains was performed to evaluate site of origin. The tumor cells are focally positive for PSA and PSAP and negative for CK7, CK20, TTF-1, CDX-2 and Napsin A. Morphology and immunoprofile are most consistent with a metastatic prostatic adenocarcinoma.    Pathology from CT guided biopsy of R lung mass  Lung adenocarcinoma    ASSESSMENT  Bruce Clark is a 66 y.o. male with newly diagnosed widely metastatic prostate adenocarcinoma who presented with, cancer associated DVT and acute lacunar infarct.     PLAN    Prostate cancer  Castrate sensitive, treatment na??ve widely metastatic prostate cancer to lymph nodes above and below the diaphragm and bones.  Prostate adenocarcinoma diagnosed on biopsy of the supraclavicular lymph  node.  Discussed in tumor board and a small cell component has been ruled out.  PSA at the time of diagnosis at 2400.    He received Firmagon on 05/26/16. Switched to Lupron 45 mg every 6 months thereafter. On upfront Zytiga + prednisone per [facility] with evidence of response thus far    However  CT done on 01/05/17 showed improved adenopathy/ sclerosed Bone mets but the R Lung nodule had increased in size   Bx is consistent with this being a primary Lung adenocarcinoma    As regards prostate cancer will continue current plan I.e Zytiga 1000 mg daily, prednisone 5 mg BID, CBC, CMP, PSA monthly ( including today)     R lung adenocarcinoma  [id number]    Discussed in tumor board  PET CT, PFTs and referral to Dr. Glo Herring with Thoracic placed to determine if resectable/ candidate for resection    Bone metastases  Diffuse  Concerning lesions at C7, T12, S1, S2 which threaten the integrity of the spinal column  Completed palliative radiation that was initiated on 06/09/16    Continue Prednisone 49m daily.   Hold off biphosphonates due to poor dental hygiene.     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     RTC after seen by Thoracic surgery    NN present    I spent > 50% of this 40 min  face to face encounter in counseling and care co ordination  RJuliene Pina MD

## 2017-03-10 NOTE — Progress Notes (Signed)
Administrator, sports Oncology at [facility]   Nurse Navigator Note      Patient seen in consultation with Dr. Melony Overly.  He is at this appointment alone.  He was recently out of state visiting friends and family.    He has a history of prostate cancer and recent scans showed improvement overall except a mass in his lung which had continued to grow. The mass was biopsied and results showed this to be a primary lung cancer.    Dr. Melony Overly shared this information with him today.   Dr. Melony Overly explained treatment for prostate cancer is not the same as treatment for lung cancer.  She explained that his case had been presented in cancer conference and the thoracic surgeon felt he could remove the lung mass.    Dr. Melony Overly explained patient is to continue current regimen for prostate cancer. She also reinforced he needs to have monthly labs to be able to continue this regimen.     Actions/Plan:  Patient will be referred to see Dr. Glo Herring with Thoracic Surgery. Our office will call him with the appointment information.    NN reviewed process for scheduling PFTs and PET scan.    NN will connect with [facility] for support with monthly labs. Patient stated he lives 4 minutes from there.    Patient agrees to contact us with questions or concerns.

## 2017-03-10 NOTE — Progress Notes (Signed)
Administrator, sports Oncology at [facility]   Nurse Navigator Note    Call to [facility] regarding possible monthly lab draws.  The earliest they could see him for a lab appointment will be in 2 weeks.   NN scheduled a lab appointment for 04/06/17 at 1000.  Patient will need to have labs drawn this week through [facility] as he cannot be seen at [facility] in a timely manner.    NN will message Dr. Rosina Lowenstein team.

## 2017-03-10 NOTE — Progress Notes (Signed)
Left message instructing patient to not go to [facility] for a lab draw tomorrow as they could not see him for 2 weeks---requested patient call our office to discuss lab draw at [facility]

## 2017-03-11 NOTE — Progress Notes (Signed)
Lab order to [facility] for processing this week.

## 2017-03-12 NOTE — Progress Notes (Signed)
Patient did not show for appointment

## 2017-03-12 NOTE — Telephone Encounter (Signed)
Message left to return call.     Call to [name] to advise of patient appointment on Monday 7/30 at 2pm with Thoracic Surgery Dr. Glo Herring.

## 2017-03-12 NOTE — Telephone Encounter (Signed)
Call to Dr. Maretta Bees office to establish care, set up referral appt, they will have Cindy reach out to our office to establish appt.

## 2017-03-13 ENCOUNTER — Inpatient Hospital Stay: Primary: Internal Medicine

## 2017-03-13 NOTE — Telephone Encounter (Signed)
Additional call to update on appointment, message left to return call.

## 2017-03-13 NOTE — Telephone Encounter (Signed)
Call to patient at home, HIPAA verified. Patient informed of appointment. Patient overwhelmed with information, requested we contact [name] to advise. Informed patient that our office had attempted to contact Mrs. Christoper Fabian and had been unable to reach, patient states that he is unable to make appt on Monday due to transportation barriers. Mrs. Christoper Fabian will be out of town until 8/1. Requests that our office reschedule and call him right back.     Call to Dr. Maretta Bees office, spoke with Jenny Reichmann, patient rescheduled for 8/7 at 10am. Call to patient and updated of new appt. Office location given to patient. Encouraged patient to have Mrs. Sweeney contact our office with any questions. Patient thanked for call and coordination.

## 2017-03-16 ENCOUNTER — Encounter: Attending: Thoracic Surgery (Cardiothoracic Vascular Surgery) | Primary: Internal Medicine

## 2017-03-23 ENCOUNTER — Inpatient Hospital Stay: Admit: 2017-03-23 | Payer: Charity | Attending: Internal Medicine | Primary: Internal Medicine

## 2017-03-23 DIAGNOSIS — C3491 Malignant neoplasm of unspecified part of right bronchus or lung: Secondary | ICD-10-CM

## 2017-03-23 MED ORDER — SODIUM CHLORIDE 0.9 % IJ SYRG
Freq: Once | INTRAMUSCULAR | Status: AC
Start: 2017-03-23 — End: 2017-03-23
  Administered 2017-03-23: 13:00:00 via INTRAVENOUS

## 2017-03-23 MED ORDER — F-18 FLUORODEOXYGLUCOSE
Freq: Once | Status: AC
Start: 2017-03-23 — End: 2017-03-23
  Administered 2017-03-23: 13:00:00 via INTRAVENOUS

## 2017-03-23 NOTE — Procedures (Signed)
[place]  PULMONARY FUNCTION    Bruce Clark, Bruce Clark  MR#: [id number]  DOB: 1951-06-14  ACCOUNT #: [id number]   DATE OF SERVICE: 03/23/2017    Spirometry was performed and revealed borderline airflow obstruction.  Flow volume loop shows reduction in mid flows suggestive of small airways disease.  Further clinical correlation is advised.      Lowella Bandy, MD       SN / DN  D: 04/06/2017 09:38     T: 04/06/2017 10:29  JOB #: [id number]

## 2017-03-23 NOTE — Procedures (Signed)
[place]  PULMONARY FUNCTION    DEWAUN, KINZLER  MR#: [id number]  DOB: July 07, 1951  ACCOUNT #: [id number]   DATE OF SERVICE: 03/23/2017    Spirometry was performed and revealed borderline airflow obstruction.  Flow volume loop shows reduction in mid flows suggestive of small airways disease.  Further clinical correlation is advised.      Lowella Bandy, MD       SN / DN  D: 04/06/2017 09:38     T: 04/06/2017 10:29  JOB #: [id number]

## 2017-03-24 ENCOUNTER — Ambulatory Visit
Admit: 2017-03-24 | Discharge: 2017-03-24 | Attending: Thoracic Surgery (Cardiothoracic Vascular Surgery) | Primary: Internal Medicine

## 2017-03-24 ENCOUNTER — Inpatient Hospital Stay: Primary: Internal Medicine

## 2017-03-24 DIAGNOSIS — C3431 Malignant neoplasm of lower lobe, right bronchus or lung: Secondary | ICD-10-CM

## 2017-03-24 LAB — CBC WITH AUTOMATED DIFF
ABS. BASOPHILS: 0 10*3/uL (ref 0.0–0.2)
ABS. EOSINOPHILS: 0.1 10*3/uL (ref 0.0–0.4)
ABS. IMM. GRANS.: 0 10*3/uL (ref 0.0–0.1)
ABS. MONOCYTES: 0.3 10*3/uL (ref 0.1–0.9)
ABS. NEUTROPHILS: 3.1 10*3/uL (ref 1.4–7.0)
Abs Lymphocytes: 1.2 10*3/uL (ref 0.7–3.1)
BASOPHILS: 0 %
EOSINOPHILS: 2 %
HCT: 47.6 % (ref 37.5–51.0)
HGB: 16.1 g/dL (ref 13.0–17.7)
IMMATURE GRANULOCYTES: 0 %
Lymphocytes: 25 %
MCH: 30.6 pg (ref 26.6–33.0)
MCHC: 33.8 g/dL (ref 31.5–35.7)
MCV: 90 fL (ref 79–97)
MONOCYTES: 6 %
NEUTROPHILS: 67 %
PLATELET: 137 10*3/uL — ABNORMAL LOW (ref 150–379)
RBC: 5.27 x10E6/uL (ref 4.14–5.80)
RDW: 13.6 % (ref 12.3–15.4)
WBC: 4.6 10*3/uL (ref 3.4–10.8)

## 2017-03-24 LAB — METABOLIC PANEL, COMPREHENSIVE
A-G Ratio: 1.5 (ref 1.2–2.2)
ALT (SGPT): 34 IU/L (ref 0–44)
AST (SGOT): 25 IU/L (ref 0–40)
Albumin: 4.3 g/dL (ref 3.6–4.8)
Alk. phosphatase: 55 IU/L (ref 39–117)
BUN/Creatinine ratio: 17 (ref 10–24)
BUN: 24 mg/dL (ref 8–27)
Bilirubin, total: 0.7 mg/dL (ref 0.0–1.2)
CO2: 23 mmol/L (ref 20–29)
Calcium: 9.2 mg/dL (ref 8.6–10.2)
Chloride: 103 mmol/L (ref 96–106)
Creatinine: 1.41 mg/dL — ABNORMAL HIGH (ref 0.76–1.27)
GFR est AA: 61 mL/min/{1.73_m2} (ref >59)
GFR est non-AA: 53 mL/min/{1.73_m2} — ABNORMAL LOW (ref >59)
GLOBULIN, TOTAL: 2.9 g/dL (ref 1.5–4.5)
Glucose: 133 mg/dL — ABNORMAL HIGH (ref 65–99)
Potassium: 4.5 mmol/L (ref 3.5–5.2)
Protein, total: 7.2 g/dL (ref 6.0–8.5)
Sodium: 140 mmol/L (ref 134–144)

## 2017-03-24 LAB — PSA, DIAGNOSTIC (PROSTATE SPECIFIC AG): Prostate Specific Ag: 0.3 ng/mL (ref 0.0–4.0)

## 2017-03-24 NOTE — Progress Notes (Signed)
Right lung cancer evaluation.    1. Have you been to the ER, urgent care clinic since your last visit?  Hospitalized since your last visit?No    2. Have you seen or consulted any other health care providers outside of the [place] since your last visit?  Include any pap smears or colon screening. No

## 2017-03-24 NOTE — Progress Notes (Signed)
Administrator, sports Oncology at [facility]   Nurse Navigator Note      Call to patient's [facility] [name].  Reviewed appointment for [facility] labs missed today.  Joyced stated patient had labs drawn yesterday.  NN verified lab results in chart.  NN reviewed upcoming appointment with [facility] for labs to begin monthly lab appointemnts there. He agrees to make follow up monthly appointment when he is seen there.

## 2017-03-24 NOTE — Progress Notes (Signed)
Asked to see Bruce Clark re: newly diagnosed lung cancer    Referred by Dr. Juliene Pina    Bruce Clark is a pleasant 66 y/o gentleman with a history of Stage IV prostate cancer who was recently diagnosed with Adenoca of the lung.  Last year he was diagnosed with stage IV prostate cancer with mets to the vertebra and supraclavicular lymph nodes as well as retroperitoneal lymphadenopathy.  Dr. Melony Overly has been treating his with hormonal therapy and XRT since then and he has had a near complete clinical response.  On his most recent chest imaging his pulmonary nodules have regressed except for a large RLL nodule which has increased in size. A biopsy has been performed.  He is unsure of he is symptomatic or not with everything else going on.     Allergies: NKDA    PMHx: DVT, prostate cancer, lung cancer    PSHx: inguinal herniorraphy    SocHx: former smoker    FamHx: no family history of lung cancer    Meds:  apixaban (ELIQUIS) 5 mg tablet     ?? predniSONE (DELTASONE) 5 mg tablet    ?? abiraterone (ZYTIGA) 500 mg tab      ROS:    Constitutional- he hasn't noticed any weight loss  HEENT- denies dysphagia  Neuro- deneis syncope  Optho- no changes in vision  Resp- denies dyspnea  CV- denies angina  GI- abdominal discomfort has resolved  GU- no complaints  ID- denies recent fevers  VAsc- deneis claudication    Afebrile  P 70  BP 120/80    On exam he is seated upright  Alert and oriented  Well developed  Affect somewhat withdrawn  Normal speech  Not tachypneic  No cervical or supraclavicular lymphadenopathy  Lungs CTA b/l  No chest wall tenderness  RRR, no murmurs  Radial pulses palp b/l  Abd SNTND, no organomegaly  LE non edematous    ==================    WBC 4.6  Hgb 16.1  Plts 137    Cr 1.14    Alb 4.3    =====================    I have personally reveiwed his most recent chest CT and PET scan.  RLL lesion is PET avid and increasing in size.  No associated lymphadenopathy.  Previous lung nodules have regressed.     ======================    Pathology- RLL Adenocarcinoma which stains as a lung primary and is different from his prostate adenoca    =======================    PFTs  FeV1 90%    ======================    Diagnoses  1: cT2bNxMx adenocarcinoma of the RLL  2:  Stage IV prostate cancer    ======================    Bruce Clark presents with two pathologically proven separate primaries.  The Prostate cancer has responded well to treatment and now the lung cancer needs to be addressed.  He was presented in tumor board and the recommendations are to be aggressive in treating his lung primary as he has a good prognosis from a prostate standpoint.    He would require a RLL.  His PFTs are adequate and he has no history of heart disease.    Given his advanced stage prostate cancer and large lung mass he will need surgical staging of his mediastinum.  I will post him for a bronchoscopy and mediastinoscopy as the first step.    I will coordinate with Dr. Melony Overly whether we can stop his prednisone and how to manage his anticoagulation.    Thank you for allowing [place] to care  for Bruce Clark    I spent 60 minutes with Bruce Clark, greater than 50% of which was spent in counseling and education related to his situation with two primaries, lung cancer surgery and the need for staging.

## 2017-04-09 ENCOUNTER — Ambulatory Visit: Admit: 2017-04-09 | Discharge: 2017-04-09 | Attending: Internal Medicine | Primary: Internal Medicine

## 2017-04-09 DIAGNOSIS — C61 Malignant neoplasm of prostate: Secondary | ICD-10-CM

## 2017-04-09 MED ORDER — PREDNISONE 5 MG TAB
5 mg | ORAL_TABLET | Freq: Every day | ORAL | 3 refills | Status: DC
Start: 2017-04-09 — End: 2017-05-25

## 2017-04-09 NOTE — Telephone Encounter (Signed)
[name] would like a call back has questions about todays visit

## 2017-04-09 NOTE — Progress Notes (Signed)
Bruce Clark is a 66 y.o. male here today for follow up, prostate cancer. Patient reports episode of nausea and vomiting this morning when taking medication on empty stomach.

## 2017-04-09 NOTE — Progress Notes (Signed)
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 male who is on Eliquis for a h/o DVT who presented to the Emergency Department on 04/22/16 with nausea, vomiting, and abdominal pain x 2 months, decreased appetite and weight loss of 10-14 lbs.  CTA revealed bilateral lung nodules and mediastinal/hilar adenopathy. PET revealed supraclavicular adenopathy, retroperitoneal adenopathy, bilateral iliac adenopathy, mediastinal/hilar adenopathy, bony metastatic disease, right lower lobe pulmonary mass, and multiple other smaller masses in bilateral lungs. USG guided bx of supraclavicular LN showed adenocarcinoma of the prostate. He was also diagnosed and treated for CAP. PSA on 05/26/16-2400. Was discussed in tumor board, Napsin A negative and a small cell component ruled out. Initiated Degarelix  05/26/16. MRI spine with expansile C7 lesion, T12 lesion encroaching epidural space, S1-S2 lesion encroaching neural foramina. Started palliative C6-T5 and T11-L5 XRT under Dr. Leron Croak on 06/09/16. Started Zytiga 08/28/16 with evidence of response. Noted to have a  Left lower lobe lung nodule on CT done 12/22/16. Biopsy consistent with Lung adenocarcinoma. Has met with Dr Glo Herring and is to be scheduled for surgical staging of the mediastinum    He continues to take Zytiga and Prednisone as directed  Feeling well overall. Denies SOB or CP. Mild cough. Drinking fluids and staying active and walks frequently.  Has no new pain. No cough, fevers, HA, falls. Weight and appetite are stable    Otherwise, complete ROS is per the symptom report form which has been scanned into the media section of the electronic medical record.    No past medical history on file.    Past Surgical History:   Procedure Laterality Date   ??? HX HERNIA REPAIR         No Known Allergies    Current Outpatient Prescriptions    Medication Sig Dispense Refill   ??? apixaban (ELIQUIS) 5 mg tablet Take 1 Tab by mouth two (2) times a day. Indications: deep venous thrombosis 60 Tab 6   ??? predniSONE (DELTASONE) 5 mg tablet Take 1 Tab by mouth daily. 30 Tab 0   ??? abiraterone (ZYTIGA) 500 mg tab Take 1,000 mg by mouth daily. 60 Cap 6       Social History     Social History   ??? Marital status: SINGLE     Spouse name: N/A   ??? Number of children: N/A   ??? Years of education: N/A     Social History Main Topics   ??? Smoking status: Former Smoker   ??? Smokeless tobacco: Never Used   ??? Alcohol use No   ??? Drug use: None   ??? Sexual activity: Not Asked     Other Topics Concern   ??? None     Social History Narrative       No family history on file.    ROS  A 12 point review of systems was obtained and is negative except as listed in HPI.  ECOG PS is 1    Physical Examination:   Visit Vitals   ??? BP 128/79   ??? Pulse 65   ??? Temp 98.8 ??F (37.1 ??C)   ??? Resp 20   ??? Ht 5' 6"  (1.676 m)   ??? Wt 153 lb 6.4 oz (69.6 kg)   ??? SpO2 98%   ??? BMI 24.76 kg/m2     General appearance - alert,  frail appearing, and in no distress  Mental status - oriented to person, place, and time  Mouth - mucous membranes moist  Neck - supple, no significant adenopathy  Neurological - normal speech, no focal findings or movement disorder noted  Musculoskeletal - no joint tenderness, deformity or swelling  Extremities - peripheral pulses normal, no pedal edema, no clubbing or cyanosis  Skin - normal coloration and turgor, no rashes, no suspicious skin lesions noted    LABS  Lab Results   Component Value Date/Time    WBC 4.6 03/23/2017 07:29 AM    HGB 16.1 03/23/2017 07:29 AM    HCT 47.6 03/23/2017 07:29 AM    PLATELET 137 (L) 03/23/2017 07:29 AM    MCV 90 03/23/2017 07:29 AM    ABS. NEUTROPHILS 3.1 03/23/2017 07:29 AM     Lab Results   Component Value Date/Time    Sodium 140 03/23/2017 07:29 AM    Potassium 4.5 03/23/2017 07:29 AM    Chloride 103 03/23/2017 07:29 AM    CO2 23 03/23/2017 07:29 AM     Glucose 133 (H) 03/23/2017 07:29 AM    BUN 24 03/23/2017 07:29 AM    Creatinine 1.41 (H) 03/23/2017 07:29 AM    GFR est AA 61 03/23/2017 07:29 AM    GFR est non-AA 53 (L) 03/23/2017 07:29 AM    Calcium 9.2 03/23/2017 07:29 AM     Lab Results   Component Value Date/Time    AST (SGOT) 25 03/23/2017 07:29 AM    Alk. phosphatase 55 03/23/2017 07:29 AM    Protein, total 7.2 03/23/2017 07:29 AM    Albumin 4.3 03/23/2017 07:29 AM    Globulin 3.7 01/09/2017 10:32 AM    A-G Ratio 1.5 03/23/2017 07:29 AM     Recent Labs      03/23/17   0729  01/09/17   1032  11/10/16   2147  09/15/16   0920   PSA   --   0.1  0.2  1.2   PSALT  0.3   --    --    --        IMAGING  PET CT  03/23/17  IMPRESSION  IMPRESSION: Mass lesion right lower lobe is hypermetabolic and compatible with  the biopsy confirmed the result of adenocarcinoma. There are soft tissue  densities in the anterior abdominal wall bilaterally which are stable compared  to the prior chest abdomen pelvis CT but new compared to the prior PET/CT.  Please correlate with any recent abdominal wall surgery as these may represent  port sites. Otherwise normal tracer distribution. Stable sclerotic osseous  metastatic disease without abnormal activity.      PATHOLOGY    Supraclavicular node   CYTOLOGIC INTERPRETATION:   Adenocarcinoma, consistent with prostate primary   See comment   General Categorization   Positive for malignancy.   Specimen Adequacy   Satisfactory for evaluation.   Comment   Touch preps and a core biopsy are examined and show islands of adenocarcinoma surrounded by fibrous stroma. A panel of immunohistochemical stains was performed to evaluate site of origin. The tumor cells are focally positive for PSA and PSAP and negative for CK7, CK20, TTF-1, CDX-2 and Napsin A. Morphology and immunoprofile are most consistent with a metastatic prostatic adenocarcinoma.    Pathology from CT guided biopsy of R lung mass  Lung adenocarcinoma    ASSESSMENT   [name] is a 65 y.o. male with newly diagnosed widely metastatic prostate adenocarcinoma who  presented with, cancer associated DVT and acute lacunar infarct.     PLAN    Prostate cancer  Castrate sensitive, treatment na??ve widely metastatic prostate cancer to lymph nodes above and below the diaphragm and bones.  Prostate adenocarcinoma diagnosed on biopsy of the supraclavicular lymph node.  Discussed in tumor board and a small cell component has been ruled out.  PSA at the time of diagnosis at 2400.    He received Firmagon on 05/26/16. Switched to Lupron 45 mg every 6 months thereafter. On upfront Zytiga + prednisone per [facility] with evidence of response thus far.     However CT done on 01/05/17 showed improved adenopathy/ sclerosed Bone mets but the R Lung nodule had increased in size   Bx is consistent with this being a primary Lung adenocarcinoma    As regards prostate cancer will continue current plan I.e Zytiga 1000 mg daily, prednisone 5 mg BID, CBC, CMP, PSA monthly - this is down to 0.3 as of 03/23/17    R lung adenocarcinoma  [id number]    Discussed in tumor board  PET CT, PFTs competed  Seen Dr. Glo Herring with Thoracic and is to have a surgical mediastinal staging to assess for resectability    I have reached out to their office to assist with scheduling    I need to hold Zytiga and prednisone at least 2 weeks before surgery.  So once we have the date we will call him and stop these medications    Bone metastases  Diffuse  Concerning lesions at C7, T12, S1, S2 which threaten the integrity of the spinal column  Completed palliative radiation that was initiated on 06/09/16    Continue Prednisone 88m daily. Hold prednisone when surgery is scheduled with Dr. FGlo Herring  Hold off biphosphonates due to poor dental hygiene.     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     Labs and follow-up in 1 month     I spent > 50% of this 40 min  face to face encounter in counseling and care co ordination    RJuliene Pina MD

## 2017-04-13 NOTE — Progress Notes (Signed)
Administrator, sports Oncology at [facility]   Nurse Navigator Note      Call to patient. Reviewed Dr. Johnnye Sima message regarding his office attempting to schedule patient's bronchoscopy and mediastinoscopy. Patient gave the phone to his significant other. Reviewed recommendations for procedure stated above and instructed them to call Dr. Johnnye Sima office to start process of scheduling. Reviewed phone number for Dr. Johnnye Sima office.  They stated they would contact Dr. Johnnye Sima office today.

## 2017-04-13 NOTE — Telephone Encounter (Signed)
Called and spoke with Bruce Clark (confirmed spelling of last name).  She is a "good friend" (on HIPPA form).    I advised her that, per Dr. Glo Herring, [name] has been trying to reach the patient since August 9th to schedule surgery.    [name] stated that any calls should be going through her.    She asked for [name]'s phone number to call her.

## 2017-04-13 NOTE — Telephone Encounter (Signed)
[name] called and stated patient saw Dr. Melony Overly last week and she's not sure what the next step for the patient is. I asked if patient needed to be scheduled for surgery but she was unsure. Please call her.

## 2017-04-15 ENCOUNTER — Inpatient Hospital Stay: Admit: 2017-04-15 | Payer: Charity | Primary: Internal Medicine

## 2017-04-15 DIAGNOSIS — Z01818 Encounter for other preprocedural examination: Secondary | ICD-10-CM

## 2017-04-15 NOTE — Telephone Encounter (Signed)
Spoke with PreAdmission testing, clarified hold of medications prior to surgery. Per provider patient will only need to hold Eliquis 3 days prior to surgery, will need to continue prednisone and Zytiga.

## 2017-04-15 NOTE — Other (Signed)
Patient verbalizes understanding of preoperative instructions:  Given skin prep chlorhexidine wipes-given written and verbal instructions on use. PT STOP DATES ON MEDS CONFIRMED WITH DR. Rosina Lowenstein NURSE.

## 2017-04-16 LAB — TYPE AND SCREEN
ABO/Rh: A POS
Antibody Screen: NEGATIVE

## 2017-04-16 LAB — TYPE & SCREEN
ABO/Rh(D): A POS
Antibody screen: NEGATIVE

## 2017-04-16 NOTE — Other (Signed)
ABNORMAL LABS CALLED TO [facility] NURSE TO DR. FERGUSON. ALL LAB RESULTS FAXED TO OFFICE.

## 2017-04-17 LAB — EKG 12-LEAD
Atrial Rate: 57 {beats}/min
P Axis: 62 degrees
P-R Interval: 192 ms
Q-T Interval: 452 ms
QRS Duration: 84 ms
QTc Calculation (Bazett): 439 ms
R Axis: -75 degrees
T Axis: 30 degrees
Ventricular Rate: 57 {beats}/min

## 2017-04-17 LAB — EKG, 12 LEAD, INITIAL
Atrial Rate: 57 {beats}/min
Calculated P Axis: 62 degrees
Calculated R Axis: -75 degrees
Calculated T Axis: 30 degrees
P-R Interval: 192 ms
Q-T Interval: 452 ms
QRS Duration: 84 ms
QTC Calculation (Bezet): 439 ms
Ventricular Rate: 57 {beats}/min

## 2017-04-21 NOTE — Telephone Encounter (Addendum)
Returned [employer].  I spoke with her and she will make sure that the patient stops the Eliquis tomorrow - in preparation for his surgery on the 10th.

## 2017-04-21 NOTE — Telephone Encounter (Signed)
See Telephone Encounter labeled "Medication Evaluation" for complete documentation

## 2017-04-21 NOTE — Telephone Encounter (Addendum)
Left detailed message on [name]'s personal voicemail (on HIPAA form) that patient is to stop his Eliquis as of tomorrow in preparation for surgery on the 10th.    Asked [name] to call me back to confirm that she received my message.

## 2017-04-24 ENCOUNTER — Encounter: Primary: Internal Medicine

## 2017-04-27 ENCOUNTER — Ambulatory Visit: Admit: 2017-04-27 | Payer: Charity | Primary: Internal Medicine

## 2017-04-27 ENCOUNTER — Inpatient Hospital Stay: Payer: Charity

## 2017-04-27 MED ORDER — BUPIVACAINE (PF) 0.25 % (2.5 MG/ML) IJ SOLN
0.25 % (2.5 mg/mL) | INTRAMUSCULAR | Status: DC | PRN
Start: 2017-04-27 — End: 2017-04-27
  Administered 2017-04-27 (×2): via SUBCUTANEOUS

## 2017-04-27 MED ORDER — LIDOCAINE HCL 1 % (10 MG/ML) IJ SOLN
10 mg/mL (1 %) | INTRAMUSCULAR | Status: AC
Start: 2017-04-27 — End: ?

## 2017-04-27 MED ORDER — SODIUM CHLORIDE 0.9 % IV
INTRAVENOUS | Status: DC
Start: 2017-04-27 — End: 2017-04-27
  Administered 2017-04-27: 11:00:00 via INTRAVENOUS

## 2017-04-27 MED ORDER — FENTANYL CITRATE (PF) 50 MCG/ML IJ SOLN
50 mcg/mL | INTRAMUSCULAR | Status: DC | PRN
Start: 2017-04-27 — End: 2017-04-27

## 2017-04-27 MED ORDER — SODIUM CHLORIDE 0.9 % IJ SYRG
INTRAMUSCULAR | Status: DC | PRN
Start: 2017-04-27 — End: 2017-04-27

## 2017-04-27 MED ORDER — LACTATED RINGERS IV
INTRAVENOUS | Status: DC
Start: 2017-04-27 — End: 2017-04-27
  Administered 2017-04-27: 10:00:00 via INTRAVENOUS

## 2017-04-27 MED ORDER — MIDAZOLAM 1 MG/ML IJ SOLN
1 mg/mL | INTRAMUSCULAR | Status: DC | PRN
Start: 2017-04-27 — End: 2017-04-27
  Administered 2017-04-27: 11:00:00 via INTRAVENOUS

## 2017-04-27 MED ORDER — MIDAZOLAM 1 MG/ML IJ SOLN
1 mg/mL | INTRAMUSCULAR | Status: DC | PRN
Start: 2017-04-27 — End: 2017-04-27

## 2017-04-27 MED ORDER — PROPOFOL 10 MG/ML IV EMUL
10 mg/mL | INTRAVENOUS | Status: DC | PRN
Start: 2017-04-27 — End: 2017-04-27
  Administered 2017-04-27 (×3): via INTRAVENOUS

## 2017-04-27 MED ORDER — PROPOFOL 10 MG/ML IV EMUL
10 mg/mL | INTRAVENOUS | Status: AC
Start: 2017-04-27 — End: ?

## 2017-04-27 MED ORDER — DEXAMETHASONE SODIUM PHOSPHATE 4 MG/ML IJ SOLN
4 mg/mL | INTRAMUSCULAR | Status: DC | PRN
Start: 2017-04-27 — End: 2017-04-27
  Administered 2017-04-27: 12:00:00 via INTRAVENOUS

## 2017-04-27 MED ORDER — FENTANYL CITRATE (PF) 50 MCG/ML IJ SOLN
50 mcg/mL | INTRAMUSCULAR | Status: AC
Start: 2017-04-27 — End: ?

## 2017-04-27 MED ORDER — PHENYLEPHRINE 10 MG/250 ML INFUSION
10 mg/250 mL (40 mcg/mL) | INTRAVENOUS | Status: AC
Start: 2017-04-27 — End: ?

## 2017-04-27 MED ORDER — PHENYLEPHRINE IN 0.9 % SODIUM CL (40 MCG/ML) IV SYRINGE
0.4 mg/10 mL (40 mcg/mL) | INTRAVENOUS | Status: DC | PRN
Start: 2017-04-27 — End: 2017-04-27
  Administered 2017-04-27 (×2): via INTRAVENOUS

## 2017-04-27 MED ORDER — CEFAZOLIN 2 GRAM/20 ML IN STERILE WATER INTRAVENOUS SYRINGE
2 gram/0 mL | Freq: Once | INTRAVENOUS | Status: AC
Start: 2017-04-27 — End: 2017-04-27
  Administered 2017-04-27: 12:00:00 via INTRAVENOUS

## 2017-04-27 MED ORDER — LIDOCAINE (PF) 20 MG/ML (2 %) IJ SOLN
20 mg/mL (2 %) | INTRAMUSCULAR | Status: DC | PRN
Start: 2017-04-27 — End: 2017-04-27
  Administered 2017-04-27: 12:00:00 via INTRAVENOUS

## 2017-04-27 MED ORDER — ROCURONIUM 10 MG/ML IV
10 mg/mL | INTRAVENOUS | Status: DC | PRN
Start: 2017-04-27 — End: 2017-04-27
  Administered 2017-04-27 (×2): via INTRAVENOUS

## 2017-04-27 MED ORDER — ACETAMINOPHEN 1,000 MG/100 ML (10 MG/ML) IV
1000 mg/100 mL (10 mg/mL) | INTRAVENOUS | Status: AC
Start: 2017-04-27 — End: ?

## 2017-04-27 MED ORDER — MORPHINE 10 MG/ML INJ SOLUTION
10 mg/ml | INTRAMUSCULAR | Status: DC | PRN
Start: 2017-04-27 — End: 2017-04-27

## 2017-04-27 MED ORDER — SODIUM CHLORIDE 0.9 % IJ SYRG
Freq: Three times a day (TID) | INTRAMUSCULAR | Status: DC
Start: 2017-04-27 — End: 2017-04-27
  Administered 2017-04-27: 11:00:00 via INTRAVENOUS

## 2017-04-27 MED ORDER — NEOSTIGMINE METHYLSULFATE 5 MG/5 ML (1 MG/ML) IV SYRINGE
5 mg/ mL (1 mg/mL) | INTRAVENOUS | Status: AC
Start: 2017-04-27 — End: ?

## 2017-04-27 MED ORDER — PHENYLEPHRINE IN 0.9 % SODIUM CL (40 MCG/ML) IV SYRINGE
0.4 mg/10 mL (40 mcg/mL) | INTRAVENOUS | Status: AC
Start: 2017-04-27 — End: ?

## 2017-04-27 MED ORDER — GLYCOPYRROLATE 0.6 MG/3 ML (0.2 MG/ML) INTRAVENOUS SYRINGE
0.6 mg/3 mL (0.2 mg/mL) | INTRAVENOUS | Status: AC
Start: 2017-04-27 — End: ?

## 2017-04-27 MED ORDER — LACTATED RINGERS IV
INTRAVENOUS | Status: DC
Start: 2017-04-27 — End: 2017-04-27

## 2017-04-27 MED ORDER — PHENYLEPHRINE 10 MG/ML INJECTION
10 mg/mL | INTRAMUSCULAR | Status: DC | PRN
Start: 2017-04-27 — End: 2017-04-27
  Administered 2017-04-27 (×5): via INTRAVENOUS

## 2017-04-27 MED ORDER — NEOSTIGMINE METHYLSULFATE 1 MG/ML INJECTION
1 mg/mL | INTRAMUSCULAR | Status: DC | PRN
Start: 2017-04-27 — End: 2017-04-27
  Administered 2017-04-27: 12:00:00 via INTRAVENOUS

## 2017-04-27 MED ORDER — OXYCODONE-ACETAMINOPHEN 5 MG-325 MG TAB
5-325 mg | ORAL_TABLET | ORAL | 0 refills | Status: DC | PRN
Start: 2017-04-27 — End: 2017-05-25

## 2017-04-27 MED ORDER — BUPIVACAINE (PF) 0.25 % (2.5 MG/ML) IJ SOLN
0.25 % (2.5 mg/mL) | INTRAMUSCULAR | Status: AC
Start: 2017-04-27 — End: ?

## 2017-04-27 MED ORDER — GLYCOPYRROLATE 0.2 MG/ML IJ SOLN
0.2 mg/mL | INTRAMUSCULAR | Status: DC | PRN
Start: 2017-04-27 — End: 2017-04-27
  Administered 2017-04-27 (×2): via INTRAVENOUS

## 2017-04-27 MED ORDER — MINERAL OIL
Status: AC
Start: 2017-04-27 — End: ?

## 2017-04-27 MED ORDER — LIDOCAINE (PF) 20 MG/ML (2 %) IJ SOLN
20 mg/mL (2 %) | INTRAMUSCULAR | Status: AC
Start: 2017-04-27 — End: ?

## 2017-04-27 MED ORDER — LACTATED RINGERS IV
INTRAVENOUS | Status: DC | PRN
Start: 2017-04-27 — End: 2017-04-27
  Administered 2017-04-27: 11:00:00 via INTRAVENOUS

## 2017-04-27 MED ORDER — DEXMEDETOMIDINE 400 MCG/100 ML (4 MCG/ML) IN 0.9 % SODIUM CHLORIDE IV
400 mcg/100 mL (4 mcg/mL) | INTRAVENOUS | Status: DC | PRN
Start: 2017-04-27 — End: 2017-04-27
  Administered 2017-04-27: 12:00:00 via INTRAVENOUS

## 2017-04-27 MED ORDER — DEXMEDETOMIDINE 100 MCG/ML IV SOLN
100 mcg/mL | INTRAVENOUS | Status: AC
Start: 2017-04-27 — End: ?

## 2017-04-27 MED ORDER — ONDANSETRON (PF) 4 MG/2 ML INJECTION
4 mg/2 mL | INTRAMUSCULAR | Status: DC | PRN
Start: 2017-04-27 — End: 2017-04-27

## 2017-04-27 MED ORDER — LIDOCAINE (PF) 10 MG/ML (1 %) IJ SOLN
10 mg/mL (1 %) | INTRAMUSCULAR | Status: DC | PRN
Start: 2017-04-27 — End: 2017-04-27

## 2017-04-27 MED ORDER — ACETAMINOPHEN 1,000 MG/100 ML (10 MG/ML) IV
1000 mg/100 mL (10 mg/mL) | INTRAVENOUS | Status: DC | PRN
Start: 2017-04-27 — End: 2017-04-27
  Administered 2017-04-27: 12:00:00 via INTRAVENOUS

## 2017-04-27 MED ORDER — FENTANYL CITRATE (PF) 50 MCG/ML IJ SOLN
50 mcg/mL | INTRAMUSCULAR | Status: DC | PRN
Start: 2017-04-27 — End: 2017-04-27
  Administered 2017-04-27 (×3): via INTRAVENOUS

## 2017-04-27 MED ORDER — DIPHENHYDRAMINE HCL 50 MG/ML IJ SOLN
50 mg/mL | INTRAMUSCULAR | Status: DC | PRN
Start: 2017-04-27 — End: 2017-04-27

## 2017-04-27 MED ORDER — MIDAZOLAM 1 MG/ML IJ SOLN
1 mg/mL | INTRAMUSCULAR | Status: AC
Start: 2017-04-27 — End: ?

## 2017-04-27 MED ORDER — ROPIVACAINE (PF) 5 MG/ML (0.5 %) INJECTION
5 mg/mL (0. %) | INTRAMUSCULAR | Status: DC | PRN
Start: 2017-04-27 — End: 2017-04-27

## 2017-04-27 MED ORDER — ROCURONIUM 10 MG/ML IV
10 mg/mL | INTRAVENOUS | Status: AC
Start: 2017-04-27 — End: ?

## 2017-04-27 MED ORDER — DEXAMETHASONE SODIUM PHOSPHATE 4 MG/ML IJ SOLN
4 mg/mL | INTRAMUSCULAR | Status: AC
Start: 2017-04-27 — End: ?

## 2017-04-27 MED ORDER — MINERAL OIL
Status: DC | PRN
Start: 2017-04-27 — End: 2017-04-27
  Administered 2017-04-27: 12:00:00 via TOPICAL

## 2017-04-27 MED ORDER — SUCCINYLCHOLINE CHLORIDE 20 MG/ML INJECTION
20 mg/mL | INTRAMUSCULAR | Status: DC | PRN
Start: 2017-04-27 — End: 2017-04-27
  Administered 2017-04-27: 12:00:00 via INTRAVENOUS

## 2017-04-27 MED ORDER — ONDANSETRON (PF) 4 MG/2 ML INJECTION
4 mg/2 mL | INTRAMUSCULAR | Status: DC | PRN
Start: 2017-04-27 — End: 2017-04-27
  Administered 2017-04-27: 13:00:00 via INTRAVENOUS

## 2017-04-27 MED FILL — PHENYLEPHRINE 10 MG/250 ML INFUSION: 10 mg/250 mL (40 mcg/mL) | INTRAVENOUS | Qty: 250

## 2017-04-27 MED FILL — MIDAZOLAM 1 MG/ML IJ SOLN: 1 mg/mL | INTRAMUSCULAR | Qty: 2

## 2017-04-27 MED FILL — FENTANYL CITRATE (PF) 50 MCG/ML IJ SOLN: 50 mcg/mL | INTRAMUSCULAR | Qty: 5

## 2017-04-27 MED FILL — GLYCOPYRROLATE 0.6 MG/3 ML (0.2 MG/ML) INTRAVENOUS SYRINGE: 0.6 mg/3 mL (0.2 mg/mL) | INTRAVENOUS | Qty: 3

## 2017-04-27 MED FILL — DEXAMETHASONE SODIUM PHOSPHATE 4 MG/ML IJ SOLN: 4 mg/mL | INTRAMUSCULAR | Qty: 2

## 2017-04-27 MED FILL — MURI-LUBE OIL: Qty: 10

## 2017-04-27 MED FILL — PRECEDEX 100 MCG/ML INTRAVENOUS SOLUTION: 100 mcg/mL | INTRAVENOUS | Qty: 2

## 2017-04-27 MED FILL — XYLOCAINE-MPF 20 MG/ML (2 %) INJECTION SOLUTION: 20 mg/mL (2 %) | INTRAMUSCULAR | Qty: 4

## 2017-04-27 MED FILL — LACTATED RINGERS IV: INTRAVENOUS | Qty: 1000

## 2017-04-27 MED FILL — CEFAZOLIN 2 GRAM/20 ML IN STERILE WATER INTRAVENOUS SYRINGE: 2 gram/0 mL | INTRAVENOUS | Qty: 20

## 2017-04-27 MED FILL — LIDOCAINE HCL 1 % (10 MG/ML) IJ SOLN: 10 mg/mL (1 %) | INTRAMUSCULAR | Qty: 30

## 2017-04-27 MED FILL — OFIRMEV 1,000 MG/100 ML (10 MG/ML) INTRAVENOUS SOLUTION: 1000 mg/100 mL (10 mg/mL) | INTRAVENOUS | Qty: 100

## 2017-04-27 MED FILL — DIPRIVAN 10 MG/ML INTRAVENOUS EMULSION: 10 mg/mL | INTRAVENOUS | Qty: 40

## 2017-04-27 MED FILL — NEOSTIGMINE METHYLSULFATE 5 MG/5 ML (1 MG/ML) IV SYRINGE: 5 mg/ mL (1 mg/mL) | INTRAVENOUS | Qty: 5

## 2017-04-27 MED FILL — PHENYLEPHRINE IN 0.9 % SODIUM CL (40 MCG/ML) IV SYRINGE: 0.4 mg/10 mL (40 mcg/mL) | INTRAVENOUS | Qty: 10

## 2017-04-27 MED FILL — ROCURONIUM 10 MG/ML IV: 10 mg/mL | INTRAVENOUS | Qty: 5

## 2017-04-27 MED FILL — SENSORCAINE-MPF 0.25 % (2.5 MG/ML) INJECTION SOLUTION: 0.25 % (2.5 mg/mL) | INTRAMUSCULAR | Qty: 30

## 2017-04-27 NOTE — Brief Op Note (Signed)
BRIEF OPERATIVE NOTE    Date of Procedure: 04/27/2017   Preoperative Diagnosis: 1: LUNG CANCER 2: Stage IV prostate cancer  Postoperative Diagnosis: same   Procedure(s):  FLEXIBLE BRONCHOSCOPY AND MEDIASTINOScopy  Surgeon(s) and Role:     * [name], MD - Primary         Surgical Assistant: none    Surgical Staff:  Circ-1: Delia Heady, RN  Scrub Tech-1: Joanell Rising  Scrub RN-1: Estanislado Spire, RN  Surg Asst-1: Lyda Kalata  Event Time In   Incision Start [phone]   Incision Close      Anesthesia: General   Estimated Blood Loss: minimal  Specimens:   ID Type Source Tests Collected by Time Destination   1 : 4 R Lymph Node Fresh Lymph Node  [name], MD 04/27/2017 [id number] Pathology      Findings: large 4R LN , bronch normal  Complications: none  Implants: * No implants in log *     Condition: stable    Disposition: to pacu

## 2017-04-27 NOTE — Anesthesia Post-Procedure Evaluation (Signed)
Post-Anesthesia Evaluation and Assessment    Patient: Bruce Clark MRN: [id number]  SSN: [id number]    Date of Birth: [birth year]  Age: 66 y.o.  Sex: male       Cardiovascular Function/Vital Signs  Visit Vitals   ??? BP 150/89 (BP 1 Location: Right arm, BP Patient Position: Head of bed elevated (Comment degrees))   ??? Pulse 60   ??? Temp 36.7 ??C (98 ??F)   ??? Resp 10   ??? Ht 5\' 6"  (1.676 m)   ??? Wt 68.9 kg (152 lb)   ??? SpO2 99%   ??? BMI 24.53 kg/m2       Patient is status post general anesthesia for Procedure(s):  FLEXIBLE BRONCHOSCOPY AND MEDIASTINOSTOMY .    Nausea/Vomiting: None    Postoperative hydration reviewed and adequate.    Pain:  Pain Scale 1: FLACC (04/27/17 0953)  Pain Intensity 1: 0 (04/27/17 0912)   Managed    Neurological Status:   Neuro (WDL): Within Defined Limits (04/27/17 0617)   At baseline    Mental Status and Level of Consciousness: Arousable    Pulmonary Status:   O2 Device: Room air (04/27/17 0953)   Adequate oxygenation and airway patent    Complications related to anesthesia: None    Post-anesthesia assessment completed. No concerns    Signed By: Irene Pap, MD     April 27, 2017

## 2017-04-27 NOTE — H&P (Signed)
Asked to see Bruce Clark re: newly diagnosed lung cancer  ??  Referred by Dr. Juliene Pina  ??  Bruce Clark is a pleasant 66 y/o gentleman with a history of Stage IV prostate cancer who was recently diagnosed with Adenoca of the lung.  Last year he was diagnosed with stage IV prostate cancer with mets to the vertebra and supraclavicular lymph nodes as well as retroperitoneal lymphadenopathy.  Dr. Melony Overly has been treating his with hormonal therapy and XRT since then and he has had a near complete clinical response.  On his most recent chest imaging his pulmonary nodules have regressed except for a large RLL nodule which has increased in size. A biopsy has been performed.  He is unsure of he is symptomatic or not with everything else going on.   ??  Allergies: NKDA  ??  PMHx: DVT, prostate cancer, lung cancer  ??  PSHx: inguinal herniorraphy  ??  SocHx: former smoker  ??  FamHx: no family history of lung cancer  ??  Meds:  apixaban (ELIQUIS) 5 mg tablet ?? ??   ???? predniSONE (DELTASONE) 5 mg tablet ??   ???? abiraterone (ZYTIGA) 500 mg tab ??   ??  ROS:  ??  Constitutional- he hasn't noticed any weight loss  HEENT- denies dysphagia  Neuro- deneis syncope  Optho- no changes in vision  Resp- denies dyspnea  CV- denies angina  GI- abdominal discomfort has resolved  GU- no complaints  ID- denies recent fevers  VAsc- deneis claudication  ??  Afebrile  P 70  BP 120/80  ??  On exam he is seated upright  Alert and oriented  Well developed  Affect somewhat withdrawn  Normal speech  Not tachypneic  No cervical or supraclavicular lymphadenopathy  Lungs CTA b/l  No chest wall tenderness  RRR, no murmurs  Radial pulses palp b/l  Abd SNTND, no organomegaly  LE non edematous  ??  ==================  ??  WBC 4.6  Hgb 16.1  Plts 137  ??  Cr 1.14  ??  Alb 4.3  ??  =====================  ??  I have personally reveiwed his most recent chest CT and PET scan.  RLL lesion is PET avid and increasing in size.  No associated lymphadenopathy.   Previous lung nodules have regressed.  ??  ======================  ??  Pathology- RLL Adenocarcinoma which stains as a lung primary and is different from his prostate adenoca  ??  =======================  ??  PFTs  FeV1 90%  ??  ======================  ??  Diagnoses  1: cT2bNxMx adenocarcinoma of the RLL  2:  Stage IV prostate cancer  ??  ======================  ??  [name] presents with two pathologically proven separate primaries.  The Prostate cancer has responded well to treatment and now the lung cancer needs to be addressed.  He was presented in tumor board and the recommendations are to be aggressive in treating his lung primary as he has a good prognosis from a prostate standpoint.  ??  He would require a RLL.  His PFTs are adequate and he has no history of heart disease.  ??  Given his advanced stage prostate cancer and large lung mass he will need surgical staging of his mediastinum.  I will post him for a bronchoscopy and mediastinoscopy as the first step.  ??  I will coordinate with Dr. Melony Overly whether we can stop his prednisone and how to manage his anticoagulation.

## 2017-04-27 NOTE — Anesthesia Pre-Procedure Evaluation (Addendum)
Anesthetic History   No history of anesthetic complications            Review of Systems / Medical History  Patient summary reviewed, nursing notes reviewed and pertinent labs reviewed    Pulmonary  Within defined limits                 Neuro/Psych       CVA       Cardiovascular  Within defined limits                Exercise tolerance: >4 METS  Comments: Ef 55%   GI/Hepatic/Renal     GERD           Endo/Other        Cancer     Other Findings            Physical Exam    Airway  Mallampati: II  TM Distance: 4 - 6 cm  Neck ROM: normal range of motion   Mouth opening: Normal     Cardiovascular  Regular rate and rhythm,  S1 and S2 normal,  no murmur, click, rub, or gallop             Dental    Dentition: Poor dentition     Pulmonary  Breath sounds clear to auscultation               Abdominal  GI exam deferred       Other Findings            Anesthetic Plan    ASA: 3  Anesthesia type: general          Induction: Intravenous  Anesthetic plan and risks discussed with: Patient

## 2017-04-27 NOTE — Op Note (Signed)
[facility]  OPERATIVE REPORT    Bruce Clark, Bruce Clark  MR#: [id number]  DOB: [birth year]  ACCOUNT #: [id number]   DATE OF SERVICE: 04/27/2017    CLINICAL SERVICE:  Thoracic Surgery     SURGEON:  Virgina Organ, MD    PROCEDURES PERFORMED:  1.  Flexible bronchoscopy.  2.  Mediastinoscopy with excisional biopsies.    PREOPERATIVE DIAGNOSES:  1.  Adenocarcinoma of the right lower lobe.  2.  Stage IV prostate cancer.    POSTOPERATIVE DIAGNOSES:  1.  Adenocarcinoma of the right lower lobe.  2.  Stage IV prostate cancer.    ASSISTANT:  None.    SPECIMENS REMOVED:  Lymph nodes from station 4R, sent to Anatomic Pathology.    DRAINS AND TUBES:  None.    ANESTHESIA:  General with endotracheal intubation.    ESTIMATED BLOOD LOSS:  Less than 15 mL.    INDICATIONS FOR PROCEDURE:  The patient is a 66 year old gentleman who has been treated by Oncology for stage IV prostate cancer.  He has been found to have an expanding lung nodule in the right lower lobe, which was biopsied and found to be a concurrent lung primary.  He was presented multiple times in Tumor Board and, given the disease response of his prostate cancer, the consensus was to proceed with normal aggressive treatment of his lung cancer.  The decision was then made to proceed with surgical staging before resection.    PROCEDURE IN DETAIL:  After informed consent was obtained and placed on the chart, the patient was taken to the operating room and placed supine on the table.  General anesthesia with endotracheal intubation was induced without complication.  Preop antibiotics were administered.  Timeout was performed.    A flexible bronchoscopy was then performed.  The distal trachea and carina were normal in appearance.  The right tracheobronchial tree was normal in appearance with no obvious signs of pathology.  The left tracheobronchial tree was also normal in appearance.    The patient's anterior chest and neck were then prepped and draped in a  sterile fashion.    A 2.5 cm semilunar incision was made just above the sternal notch.  Dissection was carried down through the platysma to the level of the strap muscles.  The raphae was identified and dissection carried down to the pretracheal fascia.    The mediastinoscope was then introduced.  There was extensive inspection of the subcarinal space; however, no lymph nodes were identifiable.  There was a firm brown colored lymph node in the 4R region.  Multiple biopsies of this were taken and sent to Anatomic Pathology.  Meticulous hemostasis was ensured.    The mediastinoscope was withdrawn.  Hemostasis was again ensured.    The strap muscles were then reapproximated using 3-0 Vicryl suture.  The platysma was reapproximated using 3-0 Vicryl suture and the skin closed using 4-0 Vicryl subcuticular stitch and Dermabond.    Approximately 25 mL of 1% lidocaine mixed with 0.25% Marcaine was used as local anesthetic.    The patient was then reversed from general anesthesia, extubated and taken to PACU in stable condition.    All surgical counts were correct x2 at the end of the case.    COMPLICATIONS:  There were no immediate complications identified during this case.    Dr. Virgina Organ was present and scrubbed throughout the entire procedure.      Bruce Zimny T. Glo Herring, MD       RTF /  SN  D: 04/27/2017 12:16     T: 04/27/2017 13:55  JOB #: [id number]  CC: Juliene Pina MD

## 2017-04-27 NOTE — Other (Signed)
Mediastinoscopy incision made at 0801.  Time out included both procedures. Family notified identified of surgery start time.   1068ml of normal saline on sterile field.

## 2017-04-27 NOTE — Other (Signed)
Patient: Bruce Clark MRN: [id number]  SSN: [id number]   Date of Birth: [birth year]  Age: 66 y.o.  Sex: male     Patient is status post Procedure(s):  FLEXIBLE BRONCHOSCOPY AND MEDIASTINOSTOMY .    Surgeon(s) and Role:     * [name], MD - Primary    Local/Dose/Irrigation:  Marcaine 0.25% + Lidocaine 1% 1:1 56ml injected                  Peripheral IV 04/27/17 Left Arm (Active)       Peripheral IV 04/27/17 Right Wrist (Active)            Airway - Endotracheal Tube 04/27/17 Oral (Active)                   Dressing/Packing:  Wound N/A Anterior-DRESSING TYPE: Topical skin adhesive/glue (04/27/17 0825)  Splint/Cast:  ]    Other:

## 2017-04-27 NOTE — Other (Addendum)
Friend verbalized understanding of all instructions in PHASE II.  Patient tolerated crackers and drink.  VS's STABLE. 02 SAT 99%.  Rx for PERCOCET given.  Eloquis to be held until Wednesday.  All belonging taken.  Grandson working at [employer], will be driving them home.  Waiting for grandson.

## 2017-04-27 NOTE — Op Note (Signed)
[facility]  OPERATIVE REPORT    BRODEE, MAURITZ  MR#: [id number]  DOB: Jun 24, 1951  ACCOUNT #: [id number]   DATE OF SERVICE: 04/27/2017    CLINICAL SERVICE:  Thoracic Surgery     SURGEON:  Virgina Organ, MD    PROCEDURES PERFORMED:  1.  Flexible bronchoscopy.  2.  Mediastinoscopy with excisional biopsies.    PREOPERATIVE DIAGNOSES:  1.  Adenocarcinoma of the right lower lobe.  2.  Stage IV prostate cancer.    POSTOPERATIVE DIAGNOSES:  1.  Adenocarcinoma of the right lower lobe.  2.  Stage IV prostate cancer.    ASSISTANT:  None.    SPECIMENS REMOVED:  Lymph nodes from station 4R, sent to Anatomic Pathology.    DRAINS AND TUBES:  None.    ANESTHESIA:  General with endotracheal intubation.    ESTIMATED BLOOD LOSS:  Less than 15 mL.    INDICATIONS FOR PROCEDURE:  The patient is a 66 year old gentleman who has been treated by Oncology for stage IV prostate cancer.  He has been found to have an expanding lung nodule in the right lower lobe, which was biopsied and found to be a concurrent lung primary.  He was presented multiple times in Tumor Board and, given the disease response of his prostate cancer, the consensus was to proceed with normal aggressive treatment of his lung cancer.  The decision was then made to proceed with surgical staging before resection.    PROCEDURE IN DETAIL:  After informed consent was obtained and placed on the chart, the patient was taken to the operating room and placed supine on the table.  General anesthesia with endotracheal intubation was induced without complication.  Preop antibiotics were administered.  Timeout was performed.    A flexible bronchoscopy was then performed.  The distal trachea and carina were normal in appearance.  The right tracheobronchial tree was normal in appearance with no obvious signs of pathology.  The left tracheobronchial tree was also normal in appearance.    The patient's anterior chest and neck were then prepped and draped in a  sterile fashion.    A 2.5 cm semilunar incision was made just above the sternal notch.  Dissection was carried down through the platysma to the level of the strap muscles.  The raphae was identified and dissection carried down to the pretracheal fascia.    The mediastinoscope was then introduced.  There was extensive inspection of the subcarinal space; however, no lymph nodes were identifiable.  There was a firm brown colored lymph node in the 4R region.  Multiple biopsies of this were taken and sent to Anatomic Pathology.  Meticulous hemostasis was ensured.    The mediastinoscope was withdrawn.  Hemostasis was again ensured.    The strap muscles were then reapproximated using 3-0 Vicryl suture.  The platysma was reapproximated using 3-0 Vicryl suture and the skin closed using 4-0 Vicryl subcuticular stitch and Dermabond.    Approximately 25 mL of 1% lidocaine mixed with 0.25% Marcaine was used as local anesthetic.    The patient was then reversed from general anesthesia, extubated and taken to PACU in stable condition.    All surgical counts were correct x2 at the end of the case.    COMPLICATIONS:  There were no immediate complications identified during this case.    Dr. Virgina Organ was present and scrubbed throughout the entire procedure.      Ofilia Rayon T. Glo Herring, MD       RTF /  SN  D: 04/27/2017 12:16     T: 04/27/2017 13:55  JOB #: [id number]  CC: Juliene Pina MD

## 2017-04-28 MED FILL — OFIRMEV 1,000 MG/100 ML (10 MG/ML) INTRAVENOUS SOLUTION: 1000 mg/100 mL (10 mg/mL) | INTRAVENOUS | Qty: 100

## 2017-04-28 MED FILL — NEOSTIGMINE METHYLSULFATE 5 MG/5 ML (1 MG/ML) IV SYRINGE: 5 mg/ mL (1 mg/mL) | INTRAVENOUS | Qty: 1

## 2017-04-28 MED FILL — DEXAMETHASONE SODIUM PHOSPHATE 4 MG/ML IJ SOLN: 4 mg/mL | INTRAMUSCULAR | Qty: 2

## 2017-04-28 MED FILL — ROCURONIUM 10 MG/ML IV: 10 mg/mL | INTRAVENOUS | Qty: 1.5

## 2017-04-28 MED FILL — PRECEDEX 100 MCG/ML INTRAVENOUS SOLUTION: 100 mcg/mL | INTRAVENOUS | Qty: 10

## 2017-04-28 MED FILL — PHENYLEPHRINE IN 0.9 % SODIUM CL (40 MCG/ML) IV SYRINGE: 0.4 mg/10 mL (40 mcg/mL) | INTRAVENOUS | Qty: 160

## 2017-04-28 MED FILL — PHENYLEPHRINE 10 MG/ML INJECTION: 10 mg/mL | INTRAMUSCULAR | Qty: 1

## 2017-04-28 MED FILL — ONDANSETRON (PF) 4 MG/2 ML INJECTION: 4 mg/2 mL | INTRAMUSCULAR | Qty: 2

## 2017-04-28 MED FILL — QUELICIN 20 MG/ML INJECTION SOLUTION: 20 mg/mL | INTRAMUSCULAR | Qty: 8

## 2017-04-28 MED FILL — XYLOCAINE-MPF 20 MG/ML (2 %) INJECTION SOLUTION: 20 mg/mL (2 %) | INTRAMUSCULAR | Qty: 3

## 2017-04-28 MED FILL — GLYCOPYRROLATE 0.6 MG/3 ML (0.2 MG/ML) INTRAVENOUS SYRINGE: 0.6 mg/3 mL (0.2 mg/mL) | INTRAVENOUS | Qty: 1.5

## 2017-05-06 NOTE — Progress Notes (Signed)
He is to see Dr. Melony Overly on 05/12/17. Patient scheduled for surgery with Dr. Glo Herring on 05/22/17

## 2017-05-07 LAB — METABOLIC PANEL, COMPREHENSIVE
A-G Ratio: 1.5 (ref 1.2–2.2)
ALT (SGPT): <5 IU/L (ref 0–44)
AST (SGOT): 11 IU/L (ref 0–40)
Albumin: 4 g/dL (ref 3.6–4.8)
Alk. phosphatase: 73 IU/L (ref 39–117)
BUN/Creatinine ratio: 15 (ref 10–24)
BUN: 9 mg/dL (ref 8–27)
Bilirubin, total: 0.3 mg/dL (ref 0.0–1.2)
CO2: 23 mmol/L (ref 20–29)
Calcium: 9.4 mg/dL (ref 8.6–10.2)
Chloride: 104 mmol/L (ref 96–106)
Creatinine: 0.6 mg/dL — ABNORMAL LOW (ref 0.76–1.27)
GFR est AA: 124 mL/min/{1.73_m2} (ref >59)
GFR est non-AA: 107 mL/min/{1.73_m2} (ref >59)
GLOBULIN, TOTAL: 2.6 g/dL (ref 1.5–4.5)
Glucose: 80 mg/dL (ref 65–99)
Potassium: 3.9 mmol/L (ref 3.5–5.2)
Protein, total: 6.6 g/dL (ref 6.0–8.5)
Sodium: 143 mmol/L (ref 134–144)

## 2017-05-07 LAB — CBC WITH AUTOMATED DIFF
ABS. BASOPHILS: 0 10*3/uL (ref 0.0–0.2)
ABS. EOSINOPHILS: 0.1 10*3/uL (ref 0.0–0.4)
ABS. IMM. GRANS.: 0 10*3/uL (ref 0.0–0.1)
ABS. MONOCYTES: 0.4 10*3/uL (ref 0.1–0.9)
ABS. NEUTROPHILS: 2.2 10*3/uL (ref 1.4–7.0)
Abs Lymphocytes: 1.2 10*3/uL (ref 0.7–3.1)
BASOPHILS: 0 %
EOSINOPHILS: 3 %
HCT: 33.2 % — ABNORMAL LOW (ref 37.5–51.0)
HGB: 11.2 g/dL — ABNORMAL LOW (ref 13.0–17.7)
IMMATURE GRANULOCYTES: 0 %
Lymphocytes: 30 %
MCH: 29.6 pg (ref 26.6–33.0)
MCHC: 33.7 g/dL (ref 31.5–35.7)
MCV: 88 fL (ref 79–97)
MONOCYTES: 10 %
NEUTROPHILS: 57 %
PLATELET: 197 10*3/uL (ref 150–379)
RBC: 3.78 x10E6/uL — ABNORMAL LOW (ref 4.14–5.80)
RDW: 13.3 % (ref 12.3–15.4)
WBC: 3.8 10*3/uL (ref 3.4–10.8)

## 2017-05-07 LAB — PSA, DIAGNOSTIC (PROSTATE SPECIFIC AG): Prostate Specific Ag: <0.1 ng/mL (ref 0.0–4.0)

## 2017-05-07 NOTE — Progress Notes (Signed)
Surgery scheduled with Dr. Glo Herring on 05/22/17

## 2017-05-08 NOTE — Telephone Encounter (Addendum)
-----   Message -----     From: Juliene Pina, MD     Sent: 05/07/2017   5:51 PM       To: Adela Lank, RN, Orson Aloe, RN    He needs to be asked to stop prednisone and Zytiga 2 weeks before surgery  Ensure that he is on schedule for his Lupron  ----- Message -----     From: Adela Lank, RN     Sent: 05/07/2017   4:52 PM       To: Orson Aloe, RN, Juliene Pina, MD    Surgery scheduled with Dr. Glo Herring on 05/22/17

## 2017-05-08 NOTE — Telephone Encounter (Signed)
Call to patient, spoke with Bruce Clark, patient caregiver, advised of note by provider and patient should hold medications (prednisone and Zytiga) starting now, prior to surgery on 10/5. Ms. Bruce Clark verbalized understanding and thanked for call.

## 2017-05-11 ENCOUNTER — Inpatient Hospital Stay: Admit: 2017-05-11 | Payer: Charity | Primary: Internal Medicine

## 2017-05-11 ENCOUNTER — Ambulatory Visit
Admit: 2017-05-11 | Discharge: 2017-05-11 | Attending: Thoracic Surgery (Cardiothoracic Vascular Surgery) | Primary: Internal Medicine

## 2017-05-11 DIAGNOSIS — C3431 Malignant neoplasm of lower lobe, right bronchus or lung: Secondary | ICD-10-CM

## 2017-05-11 DIAGNOSIS — Z01818 Encounter for other preprocedural examination: Secondary | ICD-10-CM

## 2017-05-11 NOTE — Progress Notes (Signed)
Follow up for bronchoscopy; mediastinoscopy on 04/27/17.    1. Have you been to the ER, urgent care clinic since your last visit?  Hospitalized since your last visit?No    2. Have you seen or consulted any other health care providers outside of the [facility] since your last visit?  Include any pap smears or colon screening. No      Patient stated that Friday, 05/15/17 will be his last day of taking the Eliquis. He has surgery on 05/22/17 with Dr. Glo Herring.

## 2017-05-11 NOTE — Other (Signed)
PREOPERATIVE INSTRUCTIONS REVIEWED WITH PATIENT.     PATIENT GIVEN 2 PACKS OF CHG WIPES. INSTRUCTIONS REVIEWED ON USE OF CHG WIPES.    PATIENT GIVEN SSI INFECTION SHEET.    PATIENT WAS GIVEN THE  OPPORTUNITY TO ASK QUESTIONS ON THE INFORMATION PROVIDED.

## 2017-05-11 NOTE — Progress Notes (Signed)
Bruce Clark returns for his post-op visit after a bronchoscopy/mediastinoscopy.  He has we controlled stage IV prostate cancer and a newly diagnosed RLL Adenocarcinoma.  He has no complaints after surgery.  Voice returning to baseline.      No changes to his past medical or surgical history    No changes to his medications    Afebrile  HD stable    On exam he is seated upright  Alert and oriented  Voice sounds normal  Incision well healed  Lungs CTA b/l  RRR      Path- benign 4R lymph node    Diagnoses  cT2bN0M0 Stage IIA AdenoCa RLL   2: Stage IV prostate cancer    Bruce Clark is progressing well post operatively.  Surgical staging negative.  Will proceed with a Robotic Right lower lobectomy on Oct 5th as agreed upon by Tumor Board.  He is now off his Eliquis.

## 2017-05-12 ENCOUNTER — Encounter: Attending: Internal Medicine | Primary: Internal Medicine

## 2017-05-12 ENCOUNTER — Ambulatory Visit: Admit: 2017-05-12 | Discharge: 2017-05-12 | Attending: Registered Nurse | Primary: Internal Medicine

## 2017-05-12 DIAGNOSIS — C3491 Malignant neoplasm of unspecified part of right bronchus or lung: Secondary | ICD-10-CM

## 2017-05-12 LAB — TYPE AND SCREEN
ABO/Rh: A POS
Antibody Screen: NEGATIVE

## 2017-05-12 LAB — TYPE & SCREEN
ABO/Rh(D): A POS
Antibody screen: NEGATIVE

## 2017-05-12 NOTE — Progress Notes (Signed)
Bruce Clark is a 66 y.o. male here today for follow up, lung cancer.

## 2017-05-12 NOTE — Progress Notes (Signed)
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 Mandigo is a 66 y.o. male who is on Eliquis for a h/o DVT who presented to the Emergency Department on 04/22/16 with nausea, vomiting, and abdominal pain x 2 months, decreased appetite and weight loss of 10-14 lbs.  CTA revealed bilateral lung nodules and mediastinal/hilar adenopathy. PET revealed supraclavicular adenopathy, retroperitoneal adenopathy, bilateral iliac adenopathy, mediastinal/hilar adenopathy, bony metastatic disease, right lower lobe pulmonary mass, and multiple other smaller masses in bilateral lungs. USG guided bx of supraclavicular LN showed adenocarcinoma of the prostate. He was also diagnosed and treated for CAP. PSA on 05/26/16-2400. Was discussed in tumor board, Napsin A negative and a small cell component ruled out. Initiated Degarelix  05/26/16. MRI spine with expansile C7 lesion, T12 lesion encroaching epidural space, S1-S2 lesion encroaching neural foramina. Started palliative C6-T5 and T11-L5 XRT under Dr. Leron Croak on 06/09/16. Started Zytiga 08/28/16 with evidence of response. Noted to have a  Left lower lobe lung nodule on CT done 12/22/16. Biopsy consistent with Lung adenocarcinoma. He had a mediastinoscopy with LN being negative for metastatic disease. Robotic RLL lobectomy planned on 05/22/17.     Zytiga and Prednisone on hold due to upcoming surgery with Dr. Glo Herring.    Feeling well overall and ready for upcoming surgery. Denies CP or SOB. Has mild dry cough. He is eating and drinking well without nausea or vomiting. Denies pain. Stays active and walks 1 mile a day. Denies falls. No chills/fevers.     Otherwise, complete ROS is per the symptom report form which has been scanned into the media section of the electronic medical record.    Past Medical History:   Diagnosis Date   ??? Cancer ([facility]) 2018     PROSTATE; LUNG   ??? Stroke ([place])     PT STATES, "I HAD A STROKE A LONG TIME AGO"       Past Surgical History:   Procedure Laterality Date   ??? CHEST SURGERY PROCEDURE UNLISTED  04/27/2017    BRONCHOSCOPY/MEDIASTINOSCOPY   ??? HX GI      HERNIA REPAIR   ??? HX ORTHOPAEDIC  1994    ROD IN RIGHT LEG       No Known Allergies    Current Outpatient Prescriptions   Medication Sig Dispense Refill   ??? apixaban (ELIQUIS) 5 mg tablet Take 5 mg by mouth two (2) times a day.     ??? oxyCODONE-acetaminophen (PERCOCET) 5-325 mg per tablet Take 1 Tab by mouth every four (4) hours as needed for Pain. Max Daily Amount: 6 Tabs. 30 Tab 0   ??? predniSONE (DELTASONE) 5 mg tablet Take 1 Tab by mouth daily. 30 Tab 3   ??? abiraterone (ZYTIGA) 500 mg tab Take 1,000 mg by mouth daily. (Patient taking differently: Take 1,000 mg by mouth daily. PT STOPPED TAKING MEDS) 60 Cap 6       Social History     Social History   ??? Marital status: SINGLE     Spouse name: N/A   ??? Number of children: N/A   ??? Years of education: N/A     Social History Main Topics   ??? Smoking status: Former Smoker     Packs/day: 0.50     Years: 47.00     Quit date: 06/2016   ??? Smokeless tobacco:  Never Used   ??? Alcohol use No   ??? Drug use: No   ??? Sexual activity: Not on file     Other Topics Concern   ??? Not on file     Social History Narrative       Family History   Problem Relation Age of Onset   ??? Cancer Mother      COLON   ??? Cancer Father      BRAIN TUMOR   ??? No Known Problems Sister    ??? Arthritis-osteo Brother    ??? No Known Problems Sister    ??? No Known Problems Sister    ??? No Known Problems Sister    ??? No Known Problems Brother    ??? Anesth Problems Neg Hx        ROS  A 12 point review of systems was obtained and is negative except as listed in HPI.  ECOG PS is 1    Physical Examination:   Visit Vitals   ??? Ht 5' 7"  (1.702 m)   ??? BMI 25.06 kg/m2     General appearance - alert, frail appearing, and in no distress  Mental status - oriented to person, place, and time   Mouth - mucous membranes moist  Neck - supple, no significant adenopathy, sternal incisin healing  Neurological - normal speech, no focal findings or movement disorder noted  Musculoskeletal - no joint tenderness, deformity or swelling, uses cane to walk  Extremities - peripheral pulses normal, no pedal edema, no clubbing or cyanosis  Skin - normal coloration and turgor, no rashes, no suspicious skin lesions noted    LABS  Lab Results   Component Value Date/Time    WBC 3.8 05/06/2017 09:23 AM    HGB 11.2 (L) 05/06/2017 09:23 AM    HCT 33.2 (L) 05/06/2017 09:23 AM    PLATELET 197 05/06/2017 09:23 AM    MCV 88 05/06/2017 09:23 AM    ABS. NEUTROPHILS 2.2 05/06/2017 09:23 AM     Lab Results   Component Value Date/Time    Sodium 143 05/06/2017 09:23 AM    Potassium 3.9 05/06/2017 09:23 AM    Chloride 104 05/06/2017 09:23 AM    CO2 23 05/06/2017 09:23 AM    Glucose 80 05/06/2017 09:23 AM    BUN 9 05/06/2017 09:23 AM    Creatinine 0.60 (L) 05/06/2017 09:23 AM    GFR est AA 124 05/06/2017 09:23 AM    GFR est non-AA 107 05/06/2017 09:23 AM    Calcium 9.4 05/06/2017 09:23 AM     Lab Results   Component Value Date/Time    AST (SGOT) 11 05/06/2017 09:23 AM    Alk. phosphatase 73 05/06/2017 09:23 AM    Protein, total 6.6 05/06/2017 09:23 AM    Albumin 4.0 05/06/2017 09:23 AM    Globulin 3.7 01/09/2017 10:32 AM    A-G Ratio 1.5 05/06/2017 09:23 AM     Recent Labs      05/06/17   0923  03/23/17   0729  01/09/17   1032  11/10/16   2147  09/15/16   0920   PSA   --    --   0.1  0.2  1.2   PSALT  <0.1  0.3   --    --    --        IMAGING  PET CT  03/23/17  IMPRESSION: Mass lesion right lower lobe is hypermetabolic and compatible with  the biopsy confirmed  the result of adenocarcinoma. There are soft tissue  densities in the anterior abdominal wall bilaterally which are stable compared  to the prior chest abdomen pelvis CT but new compared to the prior PET/CT.  Please correlate with any recent abdominal wall surgery as these may  represent  port sites. Otherwise normal tracer distribution. Stable sclerotic osseous  metastatic disease without abnormal activity.    PATHOLOGY    Supraclavicular node   CYTOLOGIC INTERPRETATION:   Adenocarcinoma, consistent with prostate primary   See comment   General Categorization   Positive for malignancy.   Specimen Adequacy   Satisfactory for evaluation.   Comment   Touch preps and a core biopsy are examined and show islands of adenocarcinoma surrounded by fibrous stroma. A panel of immunohistochemical stains was performed to evaluate site of origin. The tumor cells are focally positive for PSA and PSAP and negative for CK7, CK20, TTF-1, CDX-2 and Napsin A. Morphology and immunoprofile are most consistent with a metastatic prostatic adenocarcinoma.    Pathology from CT guided biopsy of R lung mass  Lung adenocarcinoma    ASSESSMENT  Mr. Uy is a 67 y.o. male with widely metastatic prostate adenocarcinoma and newly diagnosed lung adenocarcinoma who presents for follow-up     PLAN    Prostate cancer  Castrate sensitive, treatment na??ve widely metastatic prostate cancer to lymph nodes above and below the diaphragm and bones.  Prostate adenocarcinoma diagnosed on biopsy of the supraclavicular lymph node. Discussed in tumor board and a small cell component has been ruled out.  PSA at the time of diagnosis at 2400.    He received Firmagon on 05/26/16. Switched to Lupron 45 mg every 6 months thereafter. On upfront Zytiga + prednisone per [facility] with evidence of response thus far.     CT done on 01/05/17 showed improved adenopathy/ sclerosed bone mets but the R Lung nodule had increased in size   Bx is consistent with a primary Lung adenocarcinoma    As regards prostate cancer will continue current plan 2 weeks after surgery with Dr. Glo Herring i.e Zytiga 1000 mg daily, prednisone 5 mg BID, CBC, CMP, PSA monthly - this is down to <0.1 as of 05/06/17    R lung adenocarcinoma   T2bN0M0- stage IIA s/p mediastinal LN staging  Have discussed in tumor board  PET CT, PFTs competed  Seen Dr. Edythe Clarity RLL lobectomy scheduled for 10/5  Zytiga, Prednisone on hold for upcoming surgery    Bone metastases  Diffuse  Concerning lesions at C7, T12, S1, S2 which threaten the integrity of the spinal column  Completed palliative radiation that was initiated on 06/09/16  Prednisone 63m daily on hold for upcoming surgery  Hold off biphosphonates due to poor dental hygiene.       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 He will see me with next Lupron that is scheduled on 06/23/17      RJuliene Pina MD

## 2017-05-22 ENCOUNTER — Inpatient Hospital Stay: Admit: 2017-05-22 | Payer: Charity | Primary: Internal Medicine

## 2017-05-22 ENCOUNTER — Inpatient Hospital Stay
Admit: 2017-05-22 | Discharge: 2017-05-25 | Disposition: A | Discharge status: Home / Self Care | Payer: Charity | Attending: Thoracic Surgery (Cardiothoracic Vascular Surgery) | Admitting: Thoracic Surgery (Cardiothoracic Vascular Surgery)

## 2017-05-22 DIAGNOSIS — C3431 Malignant neoplasm of lower lobe, right bronchus or lung: Secondary | ICD-10-CM

## 2017-05-22 MED ORDER — SODIUM CHLORIDE 0.9 % IV
INTRAVENOUS | Status: DC
Start: 2017-05-22 — End: 2017-05-22
  Administered 2017-05-22: 18:00:00 via INTRAVENOUS

## 2017-05-22 MED ORDER — ONDANSETRON (PF) 4 MG/2 ML INJECTION
4 mg/2 mL | INTRAMUSCULAR | Status: DC | PRN
Start: 2017-05-22 — End: 2017-05-22
  Administered 2017-05-22: 17:00:00 via INTRAVENOUS

## 2017-05-22 MED ORDER — SODIUM CHLORIDE 0.9 % IV
INTRAVENOUS | Status: DC
Start: 2017-05-22 — End: 2017-05-22

## 2017-05-22 MED ORDER — SUGAMMADEX 100 MG/ML INTRAVENOUS SOLUTION
100 mg/mL | INTRAVENOUS | Status: AC
Start: 2017-05-22 — End: 2017-05-22
  Administered 2017-05-22: 17:00:00 via INTRAVENOUS

## 2017-05-22 MED ORDER — FENTANYL CITRATE (PF) 50 MCG/ML IJ SOLN
50 mcg/mL | INTRAMUSCULAR | Status: AC
Start: 2017-05-22 — End: ?

## 2017-05-22 MED ORDER — SODIUM CHLORIDE 0.9 % IJ SYRG
Freq: Three times a day (TID) | INTRAMUSCULAR | Status: DC
Start: 2017-05-22 — End: 2017-05-25
  Administered 2017-05-22 – 2017-05-25 (×9): via INTRAVENOUS

## 2017-05-22 MED ORDER — DIPHENHYDRAMINE HCL 50 MG/ML IJ SOLN
50 mg/mL | INTRAMUSCULAR | Status: DC | PRN
Start: 2017-05-22 — End: 2017-05-22

## 2017-05-22 MED ORDER — LIDOCAINE (PF) 10 MG/ML (1 %) IJ SOLN
101 mg/mL (1 %) | INTRAMUSCULAR | Status: DC | PRN
Start: 2017-05-22 — End: 2017-05-22
  Administered 2017-05-22: 13:00:00 via SUBCUTANEOUS

## 2017-05-22 MED ORDER — DOCUSATE SODIUM 100 MG CAP
100 mg | Freq: Two times a day (BID) | ORAL | Status: DC
Start: 2017-05-22 — End: 2017-05-25
  Administered 2017-05-23 – 2017-05-25 (×6): via ORAL

## 2017-05-22 MED ORDER — DEXMEDETOMIDINE 100 MCG/ML IV SOLN
100 mcg/mL | INTRAVENOUS | Status: AC
Start: 2017-05-22 — End: ?

## 2017-05-22 MED ORDER — CEFAZOLIN 2 GRAM/20 ML IN STERILE WATER INTRAVENOUS SYRINGE
2 gram/0 mL | Freq: Once | INTRAVENOUS | Status: AC
Start: 2017-05-22 — End: 2017-05-22
  Administered 2017-05-22 (×2): via INTRAVENOUS

## 2017-05-22 MED ORDER — SODIUM CHLORIDE 0.9 % IJ SYRG
INTRAMUSCULAR | Status: DC | PRN
Start: 2017-05-22 — End: 2017-05-22

## 2017-05-22 MED ORDER — MIDAZOLAM 1 MG/ML IJ SOLN
1 mg/mL | INTRAMUSCULAR | Status: AC
Start: 2017-05-22 — End: ?

## 2017-05-22 MED ORDER — FENTANYL CITRATE (PF) 50 MCG/ML IJ SOLN
50 mcg/mL | INTRAMUSCULAR | Status: DC | PRN
Start: 2017-05-22 — End: 2017-05-22

## 2017-05-22 MED ORDER — MIDAZOLAM 1 MG/ML IJ SOLN
1 mg/mL | INTRAMUSCULAR | Status: DC | PRN
Start: 2017-05-22 — End: 2017-05-22

## 2017-05-22 MED ORDER — SODIUM CHLORIDE 0.9 % IJ SYRG
Freq: Three times a day (TID) | INTRAMUSCULAR | Status: DC
Start: 2017-05-22 — End: 2017-05-22

## 2017-05-22 MED ORDER — OXYCODONE-ACETAMINOPHEN 5 MG-325 MG TAB
5-325 mg | ORAL | Status: DC | PRN
Start: 2017-05-22 — End: 2017-05-25

## 2017-05-22 MED ORDER — DEXMEDETOMIDINE 400 MCG/100 ML (4 MCG/ML) IN 0.9 % SODIUM CHLORIDE IV
400 mcg/100 mL (4 mcg/mL) | INTRAVENOUS | Status: DC | PRN
Start: 2017-05-22 — End: 2017-05-22
  Administered 2017-05-22 (×4): via INTRAVENOUS

## 2017-05-22 MED ORDER — LIDOCAINE (PF) 20 MG/ML (2 %) IJ SOLN
20 mg/mL (2 %) | INTRAMUSCULAR | Status: DC | PRN
Start: 2017-05-22 — End: 2017-05-22
  Administered 2017-05-22: 12:00:00 via INTRAVENOUS

## 2017-05-22 MED ORDER — ONDANSETRON (PF) 4 MG/2 ML INJECTION
4 mg/2 mL | INTRAMUSCULAR | Status: DC | PRN
Start: 2017-05-22 — End: 2017-05-22

## 2017-05-22 MED ORDER — DEXAMETHASONE SODIUM PHOSPHATE 4 MG/ML IJ SOLN
4 mg/mL | INTRAMUSCULAR | Status: DC | PRN
Start: 2017-05-22 — End: 2017-05-22
  Administered 2017-05-22: 12:00:00 via INTRAVENOUS

## 2017-05-22 MED ORDER — ONDANSETRON (PF) 4 MG/2 ML INJECTION
4 mg/2 mL | INTRAMUSCULAR | Status: DC | PRN
Start: 2017-05-22 — End: 2017-05-25

## 2017-05-22 MED ORDER — SENNOSIDES 8.6 MG TAB
8.6 mg | Freq: Every evening | ORAL | Status: DC
Start: 2017-05-22 — End: 2017-05-25
  Administered 2017-05-23 – 2017-05-25 (×3): via ORAL

## 2017-05-22 MED ORDER — HYDROMORPHONE (PF) 2 MG/ML IJ SOLN
2 mg/mL | INTRAMUSCULAR | Status: DC | PRN
Start: 2017-05-22 — End: 2017-05-22
  Administered 2017-05-22 (×6): via INTRAVENOUS

## 2017-05-22 MED ORDER — HYDROMORPHONE 2 MG/ML INJECTION SOLUTION
2 mg/mL | INTRAMUSCULAR | Status: AC
Start: 2017-05-22 — End: ?

## 2017-05-22 MED ORDER — MIDAZOLAM 1 MG/ML IJ SOLN
1 mg/mL | INTRAMUSCULAR | Status: DC | PRN
Start: 2017-05-22 — End: 2017-05-22
  Administered 2017-05-22: 11:00:00 via INTRAVENOUS

## 2017-05-22 MED ORDER — PHENYLEPHRINE IN 0.9 % SODIUM CL (40 MCG/ML) IV SYRINGE
0.4 mg/10 mL (40 mcg/mL) | INTRAVENOUS | Status: DC | PRN
Start: 2017-05-22 — End: 2017-05-22
  Administered 2017-05-22 (×2): via INTRAVENOUS

## 2017-05-22 MED ORDER — HYDROMORPHONE (PF) 0.5 MG/ML IN 0.9% SODIUM CHLORIDE INTRAVENOUS SOLN
0.5 mg/mL | INTRAVENOUS | Status: DC
Start: 2017-05-22 — End: 2017-05-23
  Administered 2017-05-22 – 2017-05-23 (×4): via INTRAVENOUS

## 2017-05-22 MED ORDER — SODIUM CHLORIDE 0.9 % IJ SYRG
INTRAMUSCULAR | Status: DC | PRN
Start: 2017-05-22 — End: 2017-05-25
  Administered 2017-05-23: 07:00:00 via INTRAVENOUS

## 2017-05-22 MED ORDER — LACTATED RINGERS IV
INTRAVENOUS | Status: DC
Start: 2017-05-22 — End: 2017-05-25
  Administered 2017-05-22 – 2017-05-24 (×3): via INTRAVENOUS

## 2017-05-22 MED ORDER — FENTANYL CITRATE (PF) 50 MCG/ML IJ SOLN
50 mcg/mL | INTRAMUSCULAR | Status: DC | PRN
Start: 2017-05-22 — End: 2017-05-22
  Administered 2017-05-22 (×2): via INTRAVENOUS

## 2017-05-22 MED ORDER — PHENYLEPHRINE 10 MG/ML INJECTION
10 mg/mL | INTRAMUSCULAR | Status: DC | PRN
Start: 2017-05-22 — End: 2017-05-22
  Administered 2017-05-22 (×4): via INTRAVENOUS

## 2017-05-22 MED ORDER — LACTATED RINGERS IV
INTRAVENOUS | Status: DC
Start: 2017-05-22 — End: 2017-05-22
  Administered 2017-05-22 (×2): via INTRAVENOUS

## 2017-05-22 MED ORDER — ROCURONIUM 10 MG/ML IV
10 mg/mL | INTRAVENOUS | Status: DC | PRN
Start: 2017-05-22 — End: 2017-05-22
  Administered 2017-05-22 (×7): via INTRAVENOUS

## 2017-05-22 MED ORDER — PROPOFOL 10 MG/ML IV EMUL
10 mg/mL | INTRAVENOUS | Status: DC | PRN
Start: 2017-05-22 — End: 2017-05-22
  Administered 2017-05-22 (×4): via INTRAVENOUS

## 2017-05-22 MED ORDER — SUCCINYLCHOLINE CHLORIDE 20 MG/ML INJECTION
20 mg/mL | INTRAMUSCULAR | Status: DC | PRN
Start: 2017-05-22 — End: 2017-05-22
  Administered 2017-05-22: 12:00:00 via INTRAVENOUS

## 2017-05-22 MED ORDER — MORPHINE 10 MG/ML INJ SOLUTION
10 mg/ml | INTRAMUSCULAR | Status: DC | PRN
Start: 2017-05-22 — End: 2017-05-22

## 2017-05-22 MED ORDER — LACTATED RINGERS IV
INTRAVENOUS | Status: DC
Start: 2017-05-22 — End: 2017-05-22
  Administered 2017-05-22: 18:00:00 via INTRAVENOUS

## 2017-05-22 MED ORDER — ENOXAPARIN 40 MG/0.4 ML SUB-Q SYRINGE
40 mg/0.4 mL | SUBCUTANEOUS | Status: DC
Start: 2017-05-22 — End: 2017-05-25
  Administered 2017-05-23 – 2017-05-25 (×3): via SUBCUTANEOUS

## 2017-05-22 MED ORDER — LIDOCAINE (PF) 10 MG/ML (1 %) IJ SOLN
10 mg/mL (1 %) | INTRAMUSCULAR | Status: DC | PRN
Start: 2017-05-22 — End: 2017-05-22

## 2017-05-22 MED ORDER — OXYCODONE-ACETAMINOPHEN 5 MG-325 MG TAB
5-325 mg | ORAL | Status: DC | PRN
Start: 2017-05-22 — End: 2017-05-22

## 2017-05-22 MED ORDER — SODIUM CHLORIDE 0.9 % INJECTION
INTRAMUSCULAR | Status: AC
Start: 2017-05-22 — End: ?

## 2017-05-22 MED ORDER — NALOXONE 0.4 MG/ML INJECTION
0.4 mg/mL | INTRAMUSCULAR | Status: DC | PRN
Start: 2017-05-22 — End: 2017-05-25

## 2017-05-22 MED ORDER — PHENYLEPHRINE 10 MG/250 ML INFUSION
10 mg/250 mL (40 mcg/mL) | INTRAVENOUS | Status: AC
Start: 2017-05-22 — End: ?

## 2017-05-22 MED ORDER — KETOROLAC TROMETHAMINE 30 MG/ML INJECTION
30 mg/mL (1 mL) | Freq: Four times a day (QID) | INTRAMUSCULAR | Status: DC
Start: 2017-05-22 — End: 2017-05-25
  Administered 2017-05-22 – 2017-05-25 (×12): via INTRAVENOUS

## 2017-05-22 MED FILL — SODIUM CHLORIDE 0.9 % INJECTION: INTRAMUSCULAR | Qty: 20

## 2017-05-22 MED FILL — MIDAZOLAM 1 MG/ML IJ SOLN: 1 mg/mL | INTRAMUSCULAR | Qty: 2

## 2017-05-22 MED FILL — BRIDION 100 MG/ML INTRAVENOUS SOLUTION: 100 mg/mL | INTRAVENOUS | Qty: 2.9

## 2017-05-22 MED FILL — HYDROMORPHONE 2 MG/ML INJECTION SOLUTION: 2 mg/mL | INTRAMUSCULAR | Qty: 1

## 2017-05-22 MED FILL — FENTANYL CITRATE (PF) 50 MCG/ML IJ SOLN: 50 mcg/mL | INTRAMUSCULAR | Qty: 2

## 2017-05-22 MED FILL — SODIUM CHLORIDE 0.9 % IV: INTRAVENOUS | Qty: 1000

## 2017-05-22 MED FILL — LACTATED RINGERS IV: INTRAVENOUS | Qty: 1000

## 2017-05-22 MED FILL — QUELICIN 20 MG/ML INJECTION SOLUTION: 20 mg/mL | INTRAMUSCULAR | Qty: 7

## 2017-05-22 MED FILL — ONDANSETRON (PF) 4 MG/2 ML INJECTION: 4 mg/2 mL | INTRAMUSCULAR | Qty: 2

## 2017-05-22 MED FILL — KETOROLAC TROMETHAMINE 30 MG/ML INJECTION: 30 mg/mL (1 mL) | INTRAMUSCULAR | Qty: 1

## 2017-05-22 MED FILL — PHENYLEPHRINE 10 MG/250 ML INFUSION: 10 mg/250 mL (40 mcg/mL) | INTRAVENOUS | Qty: 250

## 2017-05-22 MED FILL — HYDROMORPHONE (PF) 0.5 MG/ML IN 0.9% SODIUM CHLORIDE INTRAVENOUS SOLN: 0.5 mg/mL | INTRAVENOUS | Qty: 50

## 2017-05-22 MED FILL — ROCURONIUM 10 MG/ML IV: 10 mg/mL | INTRAVENOUS | Qty: 9

## 2017-05-22 MED FILL — CEFAZOLIN 2 GRAM/20 ML IN STERILE WATER INTRAVENOUS SYRINGE: 2 gram/0 mL | INTRAVENOUS | Qty: 20

## 2017-05-22 MED FILL — XYLOCAINE-MPF 20 MG/ML (2 %) INJECTION SOLUTION: 20 mg/mL (2 %) | INTRAMUSCULAR | Qty: 3

## 2017-05-22 MED FILL — PHENYLEPHRINE IN 0.9 % SODIUM CL (40 MCG/ML) IV SYRINGE: 0.4 mg/10 mL (40 mcg/mL) | INTRAVENOUS | Qty: 160

## 2017-05-22 MED FILL — DEXAMETHASONE SODIUM PHOSPHATE 4 MG/ML IJ SOLN: 4 mg/mL | INTRAMUSCULAR | Qty: 2

## 2017-05-22 MED FILL — PRECEDEX 100 MCG/ML INTRAVENOUS SOLUTION: 100 mcg/mL | INTRAVENOUS | Qty: 2

## 2017-05-22 MED FILL — PROPOFOL 10 MG/ML IV EMUL: 10 mg/mL | INTRAVENOUS | Qty: 32

## 2017-05-22 MED FILL — PHENYLEPHRINE 10 MG/ML INJECTION: 10 mg/mL | INTRAMUSCULAR | Qty: 1

## 2017-05-22 MED FILL — PRECEDEX 100 MCG/ML INTRAVENOUS SOLUTION: 100 mcg/mL | INTRAVENOUS | Qty: 18

## 2017-05-22 NOTE — Anesthesia Procedure Notes (Signed)
Arterial Line Placement    Performed by: Timmothy Euler  Authorized by: Jethro Bolus R     Pre-Procedure  Indications:  Arterial pressure monitoring and blood sampling  Preanesthetic Checklist: patient identified, risks and benefits discussed, anesthesia consent, site marked, patient being monitored, timeout performed and patient being monitored      Procedure:   Prep:  ChloraPrep  Seldinger Technique?: Yes    Orientation:  Right  Location:  Radial artery  Catheter size:  20 G  Number of attempts:  1    Assessment:   Post-procedure:  Line secured and sterile dressing applied  Patient Tolerance:  Patient tolerated the procedure well with no immediate complications  Comment:   Collateral perfusion verified

## 2017-05-22 NOTE — Brief Op Note (Signed)
BRIEF OPERATIVE NOTE    Date of Procedure: 05/22/2017   Preoperative Diagnosis: LUNG CANCER  Postoperative Diagnosis: LUNG CANCER    Procedure(s):  ROBOTIC RIGHT LOWER LOBECTOMY  Surgeon(s) and Role:     * [name], MD - Primary         Surgical Assistant: none    Surgical Staff:  Circ-1: Estanislado Spire, RN  Circ-Relief: Rolland Bimler, RN  Circ-Intern: Garner Nash, RN  Scrub Tech-1: Lucile Crater  Scrub RN-Relief: Rolland Bimler, RN  Surg Asst-1: Luis Abed  Float Staff: Rolland Bimler, RN  Event Time In   Incision Start [phone]   Incision Close      Anesthesia: General   Estimated Blood Loss: 131ml  Specimens:   ID Type Source Tests Collected by Time Destination   1 : chest wall margin Frozen Section Chest  [name], MD 05/22/2017 0906 Pathology   2 : station 7 lymph node Frozen Section Chest  [name], MD 05/22/2017 1004 Pathology   3 : right lower lobe lung Fresh Chest  [name], MD 05/22/2017 1237 Pathology   4 : additional chest wall margin Fresh Chest  [name], MD 05/22/2017 1239 Pathology      Findings: posteriorly the tumor was invading the chest wall.  The PA was fibrotic and densely adherent to the bronchus. Converted to open due to inability to separate the PA from the bronchus.   Complications: none  Implants: * No implants in log *     Condition: stable    Disposition: to pacu

## 2017-05-22 NOTE — Other (Signed)
Family [name])  called and informed of patient's progress.

## 2017-05-22 NOTE — Other (Signed)
Patient: Bruce Clark MRN: [id number]  SSN: [id number]   Date of Birth: 1950-10-11  Age: 66 y.o.  Sex: male     Patient is status post Procedure(s):  RIGHT LOWER LOBECTOMY, MEDIASTINAL LYMPH NODE SAMPLING, RIGHT VERTICAL MINI AXILLARY THORACOTOMY, ROBOTIC EXPLORATION OF CHEST.    Surgeon(s) and Role:     * [name], MD - Primary    Local/Dose/Irrigation:  60ML LIDOCAINE 1% AND MARCAINE 0.25%                    Arterial Line 05/22/17 Right Radial artery (Active)                         Dressing/Packing:  Wound Chest Right-DRESSING TYPE: Topical skin adhesive/glue (05/22/17 1319)  Wound Chest Right-DRESSING TYPE: 4 x 4;Special tape (comment);Other (Comment) (ISLAND DRESSING) (05/22/17 1319)  Splint/Cast:  ]

## 2017-05-22 NOTE — Op Note (Signed)
[facility]  OPERATIVE REPORT    KAENAN, JAKE  MR#: [id number]  DOB: 1951/02/12  ACCOUNT #: [id number]   DATE OF SERVICE: 05/22/2017    CLINICAL SERVICE: Thoracic surgery.     SURGEON:  Dr. Virgina Organ.     PROCEDURES PERFORMED:  1.  Robotic-assisted right lower lobectomy.  2.  Right mini vertical axillary thoracotomy.  3.  Mediastinal lymph node sampling.    PREOPERATIVE DIAGNOSES:  1.  Adenocarcinoma of the right lower lobe.  2.  Stage IV adenocarcinoma of the prostate.    POSTOPERATIVE DIAGNOSIS:    1.  Adenocarcinoma of the right lower lobe.  2.  Stage IV adenocarcinoma of the prostate.    ASSISTANT:  Latricia Heft. Shepherd.    SPECIMENS REMOVED:  1.  Chest wall margin was sent for frozen section.  2.  Station 7 lymph node was sent to anatomic pathology.  3.  Right lower lobe was sent to the anatomic pathology.  4.  Additional chest wall margin was sent to anatomic pathology.    DRAINS AND TUBES:  One 28-French chest tube was left within the right hemithorax.    ANESTHESIA:  General with double lumen endotracheal intubation.    ESTIMATED BLOOD LOSS:  For this case was 150 mL.      INDICATIONS FOR PROCEDURE:  The patient is a 66 year old gentleman who within the last year or so presented with stage IV prostate cancer.  He was treated aggressively by the oncology service and had an excellent response to treatment.  However, he had an enlarging right lower lobe mass, which did not respond to treatment and a biopsy of this mass revealed a second primary lung adenocarcinoma.  He was presented multiple times in tumor board and since his stage IV prostate cancer was felt to be in remission with excellent treatment response, the decision was made to be aggressive and treat this primary lung cancer.  He had undergone a bronchoscopy and mediastinoscopy, which were negative, and the decision was made to proceed with lobectomy.     PROCEDURE IN DETAIL:  After informed consent was obtained and placed on the chart, the patient was taken to the operating room and placed supine on the operating table.  General anesthesia with double lumen endotracheal intubation was induced without complication.  Preop antibiotics were administered.  Radial art line was placed.  The patient was then placed in the left lateral decubitus position with right side up.  The patient's right chest was prepped and draped in sterile fashion.  Timeout was performed.    In the ninth intercostal space, near the posterior axillary line, an 8 mm robotic trocar was placed.  The chest was insufflated.  Three additional robotic trocars were placed.  One 8 mm trocar was placed posteriorly in the same intercostal space.  A 12 mm trocar was placed approximately 6 cm posterior to the original trocar.  An additional 12 mm trocar was placed as far anterior as possible.  In addition, a 12 mm assist port was placed one intercostal space downward along the diaphragmatic reflection.    The robot was appropriately docked.  Instruments were introduced under direct visualization.    Robotic dissection then entailed.  It was obvious that this large tumor appeared to be adherent to the posterior chest wall.  Greater than 1 hour of dissection was performed to free the tumor for most of the posterior chest wall.  There was one area where it was especially  adherent and multiple biopsies were sent for frozen section at the chest wall margin and these were positive for adenocarcinoma.  At this point, I discussed the case with Dr. Melony Overly by phone and assist the patient was not felt to be a good candidate for chemotherapy or radiation.  The decision was made to make all possible attempts at surgical resection.  Tedious dissection was undertaken to free the lung and tumor off of the posterior chest wall.  This whole area was extremely fibrotic.     Once the lower lobe and upper lobes were then freed from the posterior chest wall, the inferior pulmonary ligament was exposed and divided.  The posterior pleural reflection was divided.  The station 7 lymph node was harvested and sent to pathology.  Frozen section was negative for carcinoma.  The left lower lobe bronchus was then dissected out.    Attention was then turned anteriorly.  The fissure was inspected.  There was a very incomplete fissure.  Dissection was carried down through the fissure and the main branches of the pulmonary artery were identified and dissected out.  The pulmonary artery appeared quite fibrotic and was not normal in appearance.  It was not possible to free up the 2 largest branches of the pulmonary artery from the bronchus.  Using the [employer], the anterior portion of the fissure was completed.  The inferior pulmonary vein was divided using Biochemist, clinical.    Multiple other attempts were then made to dissect out the pulmonary arteries; however, this was not possible to free the pulmonary artery off of the bronchus and at this juncture of the case, the decision was made to convert to a mini axillary thoracotomy to complete the resection.    The robot was safely undocked.  A mini right vertical axillary thoracotomy was performed.  The posterior portion of the incomplete fissure was divided using Endo-GIA stapler.  The rest of the bronchus was skeletonized off.  The only remaining structures were the bronchus and the 2 large pulmonary artery branches.  At this point, even via open technique, it was impossible to separate the 3 structures, given the amount of dense fibrosis, and the decision was made to do a mass ligation using a TA 45 stapler.  A TA 45 green load stapler was brought into the field and then a mass ligation of the rest of the hilar structures were performed.  This was then divided and the lobe was extirpated.  There is a very large lobe  with obvious pleural involvement.  This was sent to anatomic pathology.    Posteriorly, along the chest wall, additional biopsies were taken to send for margins.  In addition, multiple surgical clips were placed in this area for possible future radiation treatments.    The lung was then reinflated under water.  There was no bronchial stump air leak.    Approximately 60 mL 1% lidocaine mixed with 0.25% Marcaine was used to perform intercostal nerve blocks.    A 28-French chest tube was placed posteriorly.    The mini vertical axillary thoracotomy incision was then closed appropriately in a multilayer fashion and covered with Dermabond.  All trocar sites were then closed in a multilayer fashion and covered with Dermabond.  The patient was then reversed from general anesthesia, extubated and taken to PACU in stable condition.    All surgical counts were correct x2 at the end of the case.    There were no immediate  complications identified during this case.    Dr. Virgina Organ was present and scrubbed throughout the entire procedure.      Rashia Mckesson T. Glo Herring, MD       RTF / TN  D: 05/22/2017 14:11     T: 05/22/2017 19:21  JOB #: [id number]  CC: Juliene Pina MD

## 2017-05-22 NOTE — Other (Signed)
TRANSFER - OUT REPORT:    Verbal report given to [place] on [employer]  being transferred to [facility] for routine post - op       Report consisted of patient???s Situation, Background, Assessment and   Recommendations(SBAR).     Time Pre op antibiotic given:  0750/1219  Anesthesia Stop time:  3016  Foley Present on Transfer to floor: NO  Order for Foley on Chart:  N/a    Information from the following report(s) SBAR and MAR was reviewed with the receiving nurse.    Opportunity for questions and clarification was provided.     Is the patient on 02? YES       L/Min 2       Other     Is the patient on a monitor? YES    Is the nurse transporting with the patient? YES    Surgical Waiting Area notified of patient's transfer from PACU? YES    Lines:   Peripheral IV 05/22/17 Left Wrist (Active)   Site Assessment Clean, dry, & intact 05/22/2017  2:00 PM   Phlebitis Assessment 0 05/22/2017  3:40 PM   Infiltration Assessment 0 05/22/2017  3:40 PM   Dressing Status Clean, dry, & intact 05/22/2017  2:00 PM   Dressing Type Transparent 05/22/2017  2:00 PM   Hub Color/Line Status Infusing 05/22/2017  3:40 PM        The following personal items collected during your admission accompanied patient upon transfer:   Dental Appliance:    Vision:    Hearing Aid:    Jewelry: Jewelry: None  Clothing: Clothing:  (clothing to PACU)  Other Valuables: Other Valuables: None  Valuables sent to safe:

## 2017-05-22 NOTE — Anesthesia Post-Procedure Evaluation (Signed)
Post-Anesthesia Evaluation and Assessment    Patient: Bruce Clark MRN: [id number]  SSN: [id number]    Date of Birth: [birth year]  Age: 65 y.o.  Sex: male       Cardiovascular Function/Vital Signs  Visit Vitals   ??? BP 140/80   ??? Pulse 76   ??? Temp 36.8 ??C (98.3 ??F)   ??? Resp 13   ??? Ht 5\' 7"  (1.702 m)   ??? Wt 72.6 kg (160 lb)   ??? SpO2 100%   ??? BMI 25.06 kg/m2       Patient is status post general anesthesia for Procedure(s):  RIGHT LOWER LOBECTOMY, MEDIASTINAL LYMPH NODE SAMPLING, RIGHT VERTICAL MINI AXILLARY THORACOTOMY, ROBOTIC EXPLORATION OF CHEST.    Nausea/Vomiting: None    Postoperative hydration reviewed and adequate.    Pain:  Pain Scale 1: Numeric (0 - 10) (05/22/17 1500)  Pain Intensity 1: 0 (05/22/17 1405)   Managed    Neurological Status:   Neuro (WDL): Within Defined Limits (05/22/17 1500)  Neuro  Neurologic State: Drowsy (05/22/17 1500)  LUE Motor Response: Purposeful (05/22/17 1500)  LLE Motor Response: Purposeful (05/22/17 1500)  RUE Motor Response: Purposeful (05/22/17 1500)  RLE Motor Response: Purposeful (05/22/17 1500)   At baseline    Mental Status and Level of Consciousness: Alert and oriented     Pulmonary Status:   O2 Device: Nasal cannula (05/22/17 1500)   Adequate oxygenation and airway patent    Complications related to anesthesia: None    Post-anesthesia assessment completed. No concerns    Signed By: Timmothy Euler, DO     May 22, 2017

## 2017-05-22 NOTE — H&P (Addendum)
Asked to see [name] re: newly diagnosed lung cancer  ????  Referred by Dr. Juliene Clark  ????  Bruce Clark is a pleasant 66 y/o gentleman with a history of Stage IV prostate cancer who was recently diagnosed with Adenoca of the lung. ??Last year he was diagnosed with stage IV prostate cancer with mets to the vertebra and supraclavicular lymph nodes as well as retroperitoneal lymphadenopathy. ??Dr. Melony Clark has been treating his with hormonal therapy and XRT since then and he has had a near complete clinical response. ??On his most recent chest imaging his pulmonary nodules have regressed except for a large RLL nodule which has increased in size. A biopsy has been performed. ??He is unsure of he is symptomatic or not with everything else going on.   ????  Allergies: NKDA  ????  PMHx: DVT, prostate cancer, lung cancer  ????  PSHx: inguinal herniorraphy, bronch/med  ????  SocHx: former smoker  ????  FamHx: no family history of lung cancer  ????  Meds:  apixaban (ELIQUIS) 5 mg tablet ???? ????   ???? predniSONE (DELTASONE) 5 mg tablet ????   ???? abiraterone (ZYTIGA) 500 mg tab ????   ????  ROS:  ????  Constitutional- he hasn't noticed any weight loss  HEENT- denies dysphagia  Neuro- deneis syncope  Optho- no changes in vision  Resp- denies dyspnea  CV- denies angina  GI- abdominal discomfort has resolved  GU- no complaints  ID- denies recent fevers  VAsc- deneis claudication  ????  Afebrile  P 70  BP 120/80  ????  On exam he is seated upright  Alert and oriented  Well developed  Affect somewhat withdrawn  Normal speech  Not tachypneic  No cervical or supraclavicular lymphadenopathy  Lungs CTA b/l  No chest wall tenderness  RRR, no murmurs  Radial pulses palp b/l  Abd SNTND, no organomegaly  LE non edematous  ????  ==================  ????  WBC 4.6  Hgb 16.1  Plts 137  ????  Cr 1.14  ????  Alb 4.3  ????  =====================  ????  I have personally reveiwed his most recent chest CT and PET scan. ??RLL  lesion is PET avid and increasing in size. ??No associated lymphadenopathy. ??Previous lung nodules have regressed.  ????  ======================  ????  Pathology- RLL Adenocarcinoma which stains as a lung primary and is different from his prostate adenoca    Path- 4R LN neg fro carcinoma  ????  =======================  ????  PFTs ??FeV1 90%  ????  ======================  ????  Diagnoses ??1: cT2bNxMx adenocarcinoma of the RLL ??2: ??Stage IV prostate cancer  ????  ======================  ????  [name] presents with two pathologically proven separate primaries. ??The Prostate cancer has responded well to treatment and now the lung cancer needs to be addressed. ??He was presented in tumor board and the recommendations are to be aggressive in treating his lung primary as he has a good prognosis from a prostate standpoint.    Posted for a RLL.  Mediastinal staging negative.  He has been off his Eliquis.  ????    Date of Surgery Update:  Bruce Clark was seen and examined.  History and physical has been reviewed. The patient has been examined. There have been no significant clinical changes since the completion of the originally dated History and Physical.  Patient identified by surgeon; surgical site was confirmed by patient and surgeon.    Signed By: [name], MD     May 22, 2017 6:37 AM

## 2017-05-22 NOTE — Anesthesia Pre-Procedure Evaluation (Signed)
Anesthetic History   No history of anesthetic complications            Review of Systems / Medical History  Patient summary reviewed, nursing notes reviewed and pertinent labs reviewed    Pulmonary                Comments: Malignant neoplasm of right lung ([place]) ??  ?? Lung cancer ([place])     Neuro/Psych       CVA       Cardiovascular                  Exercise tolerance: <4 METS     GI/Hepatic/Renal     GERD: well controlled           Endo/Other        Blood dyscrasia, cancer and anemia    Comments: Malignant neoplasm of right lung ([facility]) ??  ?? Lung cancer ([facility])     Other Findings              Physical Exam    Airway  Mallampati: II  TM Distance: > 6 cm  Neck ROM: normal range of motion   Mouth opening: Normal     Cardiovascular    Rhythm: regular  Rate: normal         Dental    Dentition: Poor dentition  Comments: Multiple missing   Pulmonary            Prolonged expiration     Abdominal  GI exam deferred       Other Findings            Anesthetic Plan    ASA: 3  Anesthesia type: general    Monitoring Plan: Arterial line      Induction: Intravenous  Anesthetic plan and risks discussed with: Patient

## 2017-05-22 NOTE — Op Note (Signed)
[facility]  OPERATIVE REPORT    Bruce Clark, Bruce Clark  MR#: [id number]  DOB: [birth year]  ACCOUNT #: [id number]   DATE OF SERVICE: 05/22/2017    CLINICAL SERVICE: Thoracic surgery.     SURGEON:  Dr. Virgina Organ.     PROCEDURES PERFORMED:  1.  Robotic-assisted right lower lobectomy.  2.  Right mini vertical axillary thoracotomy.  3.  Mediastinal lymph node sampling.    PREOPERATIVE DIAGNOSES:  1.  Adenocarcinoma of the right lower lobe.  2.  Stage IV adenocarcinoma of the prostate.    POSTOPERATIVE DIAGNOSIS:    1.  Adenocarcinoma of the right lower lobe.  2.  Stage IV adenocarcinoma of the prostate.    ASSISTANT:  Latricia Heft. Shepherd.    SPECIMENS REMOVED:  1.  Chest wall margin was sent for frozen section.  2.  Station 7 lymph node was sent to anatomic pathology.  3.  Right lower lobe was sent to the anatomic pathology.  4.  Additional chest wall margin was sent to anatomic pathology.    DRAINS AND TUBES:  One 28-French chest tube was left within the right hemithorax.    ANESTHESIA:  General with double lumen endotracheal intubation.    ESTIMATED BLOOD LOSS:  For this case was 150 mL.      INDICATIONS FOR PROCEDURE:  The patient is a 66 year old gentleman who within the last year or so presented with stage IV prostate cancer.  He was treated aggressively by the oncology service and had an excellent response to treatment.  However, he had an enlarging right lower lobe mass, which did not respond to treatment and a biopsy of this mass revealed a second primary lung adenocarcinoma.  He was presented multiple times in tumor board and since his stage IV prostate cancer was felt to be in remission with excellent treatment response, the decision was made to be aggressive and treat this primary lung cancer.  He had undergone a bronchoscopy and mediastinoscopy, which were negative, and the decision was made to proceed with lobectomy.    PROCEDURE IN DETAIL:  After informed consent was obtained and  placed on the chart, the patient was taken to the operating room and placed supine on the operating table.  General anesthesia with double lumen endotracheal intubation was induced without complication.  Preop antibiotics were administered.  Radial art line was placed.  The patient was then placed in the left lateral decubitus position with right side up.  The patient's right chest was prepped and draped in sterile fashion.  Timeout was performed.    In the ninth intercostal space, near the posterior axillary line, an 8 mm robotic trocar was placed.  The chest was insufflated.  Three additional robotic trocars were placed.  One 8 mm trocar was placed posteriorly in the same intercostal space.  A 12 mm trocar was placed approximately 6 cm posterior to the original trocar.  An additional 12 mm trocar was placed as far anterior as possible.  In addition, a 12 mm assist port was placed one intercostal space downward along the diaphragmatic reflection.    The robot was appropriately docked.  Instruments were introduced under direct visualization.    Robotic dissection then entailed.  It was obvious that this large tumor appeared to be adherent to the posterior chest wall.  Greater than 1 hour of dissection was performed to free the tumor for most of the posterior chest wall.  There was one area where it was especially  adherent and multiple biopsies were sent for frozen section at the chest wall margin and these were positive for adenocarcinoma.  At this point, I discussed the case with Dr. Melony Overly by phone and assist the patient was not felt to be a good candidate for chemotherapy or radiation.  The decision was made to make all possible attempts at surgical resection.  Tedious dissection was undertaken to free the lung and tumor off of the posterior chest wall.  This whole area was extremely fibrotic.    Once the lower lobe and upper lobes were then freed from the posterior chest wall, the inferior pulmonary ligament was  exposed and divided.  The posterior pleural reflection was divided.  The station 7 lymph node was harvested and sent to pathology.  Frozen section was negative for carcinoma.  The left lower lobe bronchus was then dissected out.    Attention was then turned anteriorly.  The fissure was inspected.  There was a very incomplete fissure.  Dissection was carried down through the fissure and the main branches of the pulmonary artery were identified and dissected out.  The pulmonary artery appeared quite fibrotic and was not normal in appearance.  It was not possible to free up the 2 largest branches of the pulmonary artery from the bronchus.  Using the [employer], the anterior portion of the fissure was completed.  The inferior pulmonary vein was divided using Biochemist, clinical.    Multiple other attempts were then made to dissect out the pulmonary arteries; however, this was not possible to free the pulmonary artery off of the bronchus and at this juncture of the case, the decision was made to convert to a mini axillary thoracotomy to complete the resection.    The robot was safely undocked.  A mini right vertical axillary thoracotomy was performed.  The posterior portion of the incomplete fissure was divided using Endo-GIA stapler.  The rest of the bronchus was skeletonized off.  The only remaining structures were the bronchus and the 2 large pulmonary artery branches.  At this point, even via open technique, it was impossible to separate the 3 structures, given the amount of dense fibrosis, and the decision was made to do a mass ligation using a TA 45 stapler.  A TA 45 green load stapler was brought into the field and then a mass ligation of the rest of the hilar structures were performed.  This was then divided and the lobe was extirpated.  There is a very large lobe with obvious pleural involvement.  This was sent to anatomic pathology.    Posteriorly, along the chest wall, additional biopsies  were taken to send for margins.  In addition, multiple surgical clips were placed in this area for possible future radiation treatments.    The lung was then reinflated under water.  There was no bronchial stump air leak.    Approximately 60 mL 1% lidocaine mixed with 0.25% Marcaine was used to perform intercostal nerve blocks.    A 28-French chest tube was placed posteriorly.    The mini vertical axillary thoracotomy incision was then closed appropriately in a multilayer fashion and covered with Dermabond.  All trocar sites were then closed in a multilayer fashion and covered with Dermabond.  The patient was then reversed from general anesthesia, extubated and taken to PACU in stable condition.    All surgical counts were correct x2 at the end of the case.    There were no immediate  complications identified during this case.    Dr. Virgina Organ was present and scrubbed throughout the entire procedure.      Bruce Maestre T. Glo Herring, MD       RTF / TN  D: 05/22/2017 14:11     T: 05/22/2017 19:21  JOB #: [id number]  CC: Juliene Pina MD

## 2017-05-23 ENCOUNTER — Inpatient Hospital Stay: Admit: 2017-05-23 | Payer: Charity | Primary: Internal Medicine

## 2017-05-23 LAB — METABOLIC PANEL, BASIC
Anion gap: 7 mmol/L (ref 5–15)
BUN/Creatinine ratio: 24 — ABNORMAL HIGH (ref 12–20)
BUN: 17 MG/DL (ref 6–20)
CO2: 25 mmol/L (ref 21–32)
Calcium: 8.7 MG/DL (ref 8.5–10.1)
Chloride: 104 mmol/L (ref 97–108)
Creatinine: 0.7 MG/DL (ref 0.70–1.30)
GFR est AA: >60 mL/min/{1.73_m2} (ref >60)
GFR est non-AA: >60 mL/min/{1.73_m2} (ref >60)
Glucose: 100 mg/dL (ref 65–100)
Potassium: 4.3 mmol/L (ref 3.5–5.1)
Sodium: 136 mmol/L (ref 136–145)

## 2017-05-23 LAB — CBC W/O DIFF
ABSOLUTE NRBC: 0 10*3/uL (ref 0.00–0.01)
HCT: 32.4 % — ABNORMAL LOW (ref 36.6–50.3)
HGB: 10.5 g/dL — ABNORMAL LOW (ref 12.1–17.0)
MCH: 29.1 PG (ref 26.0–34.0)
MCHC: 32.4 g/dL (ref 30.0–36.5)
MCV: 89.8 FL (ref 80.0–99.0)
MPV: 9.4 FL (ref 8.9–12.9)
NRBC: 0 PER 100 WBC
PLATELET: 168 10*3/uL (ref 150–400)
RBC: 3.61 M/uL — ABNORMAL LOW (ref 4.10–5.70)
RDW: 12.5 % (ref 11.5–14.5)
WBC: 6.9 10*3/uL (ref 4.1–11.1)

## 2017-05-23 MED ORDER — OXYCODONE ER 10 MG TABLET,CRUSH RESISTANT,EXTENDED RELEASE 12 HR
10 mg | Freq: Two times a day (BID) | ORAL | Status: DC
Start: 2017-05-23 — End: 2017-05-25
  Administered 2017-05-23 – 2017-05-25 (×5): via ORAL

## 2017-05-23 MED ORDER — HYDROMORPHONE 2 MG TAB
2 mg | ORAL | Status: DC | PRN
Start: 2017-05-23 — End: 2017-05-25
  Administered 2017-05-23: 23:00:00 via ORAL

## 2017-05-23 MED ORDER — FLU VACCINE QV 2018-19 (6 MOS+)(PF) 60 MCG (15 MCG X 4)/0.5 ML IM SYRINGE
60 mcg (15 mcg x 4)/0.5 mL | INTRAMUSCULAR | Status: AC
Start: 2017-05-23 — End: 2017-05-25
  Administered 2017-05-25: 13:00:00 via INTRAMUSCULAR

## 2017-05-23 MED FILL — KETOROLAC TROMETHAMINE 30 MG/ML INJECTION: 30 mg/mL (1 mL) | INTRAMUSCULAR | Qty: 1

## 2017-05-23 MED FILL — DOK 100 MG CAPSULE: 100 mg | ORAL | Qty: 1

## 2017-05-23 MED FILL — NORMAL SALINE FLUSH 0.9 % INJECTION SYRINGE: INTRAMUSCULAR | Qty: 10

## 2017-05-23 MED FILL — ENOXAPARIN 40 MG/0.4 ML SUB-Q SYRINGE: 40 mg/0.4 mL | SUBCUTANEOUS | Qty: 0.4

## 2017-05-23 MED FILL — SENNA LAX 8.6 MG TABLET: 8.6 mg | ORAL | Qty: 1

## 2017-05-23 MED FILL — OXYCODONE ER 10 MG TABLET,CRUSH RESISTANT,EXTENDED RELEASE 12 HR: 10 mg | ORAL | Qty: 1

## 2017-05-23 MED FILL — HYDROMORPHONE 2 MG TAB: 2 mg | ORAL | Qty: 1

## 2017-05-23 MED FILL — NORMAL SALINE FLUSH 0.9 % INJECTION SYRINGE: INTRAMUSCULAR | Qty: 100

## 2017-05-23 NOTE — Progress Notes (Signed)
[facility]  Bruce Clark  ____________________________________________________________      Admit Date: 05/22/2017    POD/HD 1 Day Post-Op    Procedure:  Procedure(s):  RIGHT LOWER LOBECTOMY, MEDIASTINAL LYMPH NODE SAMPLING, RIGHT VERTICAL MINI AXILLARY THORACOTOMY, ROBOTIC EXPLORATION OF CHEST    Subjective:     Patient has no new complaints.     Objective:     Blood pressure (!) 86/55, pulse 77, temperature 98.7 ??F (37.1 ??C), resp. rate 16, height 5\' 7"  (1.702 m), weight 161 lb 13.1 oz (73.4 kg), SpO2 100 %.    Temp (24hrs), Avg:98.5 ??F (36.9 ??C), Min:97.4 ??F (36.3 ??C), Max:99 ??F (37.2 ??C)        Date 05/23/17 0700 - 05/24/17 0659   Shift 5409-8119 1500-2259 2300-0659 24 Hour Total   I  N  T  A  K  E   Shift Total  (mL/kg)       O  U  T  P  U  T   Chest Tube 120   120    Shift Total  (mL/kg) 120  (1.6)   120  (1.6)   Weight (kg) 73.4 73.4 73.4 73.4         Date 05/23/17 0700 - 05/24/17 0659   Shift 1478-2956 1500-2259 2300-0659 24 Hour Total   I  N  T  A  K  E   Shift Total  (mL/kg)       O  U  T  P  U  T   Chest Tube 120   120    Shift Total  (mL/kg) 120  (1.6)   120  (1.6)   Weight (kg) 73.4 73.4 73.4 73.4         Physical Exam:  GENERAL: alert, cooperative, no distress, LUNG: clear to auscultation bilaterally, HEART: regular rate and rhythm, S1, S2 normal, no murmur, click, rub or gallop    Incision:dressed    Chest Tube: no airleak     Labs:   Recent Results (from the past 24 hour(s))   CBC W/O DIFF    Collection Time: 05/23/17  3:24 AM   Result Value Ref Range    WBC 6.9 4.1 - 11.1 K/uL    RBC 3.61 (L) 4.10 - 5.70 M/uL    HGB 10.5 (L) 12.1 - 17.0 g/dL    HCT 32.4 (L) 36.6 - 50.3 %    MCV 89.8 80.0 - 99.0 FL    MCH 29.1 26.0 - 34.0 PG    MCHC 32.4 30.0 - 36.5 g/dL    RDW 12.5 11.5 - 14.5 %    PLATELET 168 150 - 400 K/uL    MPV 9.4 8.9 - 12.9 FL    NRBC 0.0 0 PER 100 WBC    ABSOLUTE NRBC 0.00 0.00 - 2.13 K/uL   METABOLIC PANEL, BASIC    Collection Time: 05/23/17  3:24 AM    Result Value Ref Range    Sodium 136 136 - 145 mmol/L    Potassium 4.3 3.5 - 5.1 mmol/L    Chloride 104 97 - 108 mmol/L    CO2 25 21 - 32 mmol/L    Anion gap 7 5 - 15 mmol/L    Glucose 100 65 - 100 mg/dL    BUN 17 6 - 20 MG/DL    Creatinine 0.70 0.70 - 1.30 MG/DL    BUN/Creatinine ratio 24 (H) 12 - 20      GFR est AA >60 >  60 ml/min/1.47m2    GFR est non-AA >60 >60 ml/min/1.51m2    Calcium 8.7 8.5 - 10.1 MG/DL       Data Review images and reports reviewed stable postop changes    Assessment:     Active Problems:    Lung cancer ([place]) (04/27/2017)        Plan/Recommendations/Medical Decision Making:     DC PCA and start PO pain meds  AM CXR and Labs     Thank you for allowing [place] to participate in the care of your patient.    Caprice Beaver, MD

## 2017-05-23 NOTE — Progress Notes (Signed)
Problem: Falls - Risk of  Goal: *Absence of Falls  Document Schmid Fall Risk and appropriate interventions in the flowsheet.   Outcome: Progressing Towards Goal  Fall Risk Interventions:            Medication Interventions: Patient to call before getting OOB, Teach patient to arise slowly

## 2017-05-24 ENCOUNTER — Inpatient Hospital Stay: Admit: 2017-05-24 | Payer: Charity | Primary: Internal Medicine

## 2017-05-24 LAB — METABOLIC PANEL, BASIC
Anion gap: 10 mmol/L (ref 5–15)
BUN/Creatinine ratio: 28 — ABNORMAL HIGH (ref 12–20)
BUN: 21 MG/DL — ABNORMAL HIGH (ref 6–20)
CO2: 21 mmol/L (ref 21–32)
Calcium: 8 MG/DL — ABNORMAL LOW (ref 8.5–10.1)
Chloride: 105 mmol/L (ref 97–108)
Creatinine: 0.75 MG/DL (ref 0.70–1.30)
GFR est AA: >60 mL/min/{1.73_m2} (ref >60)
GFR est non-AA: >60 mL/min/{1.73_m2} (ref >60)
Glucose: 86 mg/dL (ref 65–100)
Potassium: 4 mmol/L (ref 3.5–5.1)
Sodium: 136 mmol/L (ref 136–145)

## 2017-05-24 LAB — CBC W/O DIFF
ABSOLUTE NRBC: 0 10*3/uL (ref 0.00–0.01)
HCT: 30.6 % — ABNORMAL LOW (ref 36.6–50.3)
HGB: 10 g/dL — ABNORMAL LOW (ref 12.1–17.0)
MCH: 29.3 PG (ref 26.0–34.0)
MCHC: 32.7 g/dL (ref 30.0–36.5)
MCV: 89.7 FL (ref 80.0–99.0)
MPV: 9.9 FL (ref 8.9–12.9)
NRBC: 0 PER 100 WBC
PLATELET: 160 10*3/uL (ref 150–400)
RBC: 3.41 M/uL — ABNORMAL LOW (ref 4.10–5.70)
RDW: 12.6 % (ref 11.5–14.5)
WBC: 6.9 10*3/uL (ref 4.1–11.1)

## 2017-05-24 MED FILL — OXYCODONE ER 10 MG TABLET,CRUSH RESISTANT,EXTENDED RELEASE 12 HR: 10 mg | ORAL | Qty: 1

## 2017-05-24 MED FILL — DOK 100 MG CAPSULE: 100 mg | ORAL | Qty: 1

## 2017-05-24 MED FILL — KETOROLAC TROMETHAMINE 30 MG/ML INJECTION: 30 mg/mL (1 mL) | INTRAMUSCULAR | Qty: 1

## 2017-05-24 MED FILL — NORMAL SALINE FLUSH 0.9 % INJECTION SYRINGE: INTRAMUSCULAR | Qty: 10

## 2017-05-24 MED FILL — SENNA LAX 8.6 MG TABLET: 8.6 mg | ORAL | Qty: 1

## 2017-05-24 MED FILL — NORMAL SALINE FLUSH 0.9 % INJECTION SYRINGE: INTRAMUSCULAR | Qty: 30

## 2017-05-24 MED FILL — ENOXAPARIN 40 MG/0.4 ML SUB-Q SYRINGE: 40 mg/0.4 mL | SUBCUTANEOUS | Qty: 0.4

## 2017-05-24 NOTE — Progress Notes (Signed)
Problem: Falls - Risk of  Goal: *Absence of Falls  Document Schmid Fall Risk and appropriate interventions in the flowsheet.   Outcome: Progressing Towards Goal  Fall Risk Interventions:  Mobility Interventions: Patient to call before getting OOB         Medication Interventions: Patient to call before getting OOB, Teach patient to arise slowly         History of Falls Interventions: Door open when patient unattended, Room close to nurse's station

## 2017-05-24 NOTE — Progress Notes (Addendum)
Problem: Falls - Risk of  Goal: *Absence of Falls  Document Schmid Fall Risk and appropriate interventions in the flowsheet.   Outcome: Progressing Towards Goal  Fall Risk Interventions:  Mobility Interventions: Communicate number of staff needed for ambulation/transfer, Patient to call before getting OOB         Medication Interventions: Evaluate medications/consider consulting pharmacy, Patient to call before getting OOB         History of Falls Interventions: Door open when patient unattended, Evaluate medications/consider consulting pharmacy        Problem: Lung Procedures/VATS Pathway: Post-Op Day 1  Goal: *Hemodynamically stable  Outcome: Progressing Towards Goal  Pt not currently on any pressors.

## 2017-05-24 NOTE — Progress Notes (Addendum)
2030: Received report from Rea, [facility]. Assessment performed.     5701: MEWS score increased form 1 to 3. VS stable, patient denies pain, discomfort, or SOB. Will continue to monitor closely. Xray at bedside. Pt bathed and weighed.     0800: Bedside shift change report given to Aimee, RN (oncoming nurse) by Arthor Captain, RN (offgoing nurse). Report included the following information SBAR, OR Summary, Intake/Output, MAR, Recent Results and Cardiac Rhythm NSR.

## 2017-05-24 NOTE — Progress Notes (Signed)
Bedside shift change report given to Edwena Blow, RN (oncoming nurse) by Beatrice Lecher, RN (offgoing nurse). Report included the following information SBAR, Kardex, Intake/Output, MAR and Cardiac Rhythm NSR.

## 2017-05-24 NOTE — Progress Notes (Signed)
[facility]  [facility]  ____________________________________________________________      Admit Date: 05/22/2017    POD/HD 2 Days Post-Op    Procedure:  Procedure(s):  RIGHT LOWER LOBECTOMY, MEDIASTINAL LYMPH NODE SAMPLING, RIGHT VERTICAL MINI AXILLARY THORACOTOMY, ROBOTIC EXPLORATION OF CHEST    Subjective:     Patient has no new complaints.     Objective:     Blood pressure 108/67, pulse 82, temperature 99.1 ??F (37.3 ??C), resp. rate 23, height 5\' 7"  (1.702 m), weight 156 lb 8.4 oz (71 kg), SpO2 100 %.    Temp (24hrs), Avg:98.9 ??F (37.2 ??C), Min:98.6 ??F (37 ??C), Max:99.3 ??F (37.4 ??C)                  Physical Exam:  GENERAL: alert, cooperative, no distress, appears stated age, LUNG: clear to auscultation bilaterally, HEART: regular rate and rhythm, S1, S2 normal, no murmur, click, rub or gallop    Incision:clean, dry and intact    Chest Tube: min drainage no airleak     Labs:   Recent Results (from the past 24 hour(s))   CBC W/O DIFF    Collection Time: 05/24/17  4:08 AM   Result Value Ref Range    WBC 6.9 4.1 - 11.1 K/uL    RBC 3.41 (L) 4.10 - 5.70 M/uL    HGB 10.0 (L) 12.1 - 17.0 g/dL    HCT 30.6 (L) 36.6 - 50.3 %    MCV 89.7 80.0 - 99.0 FL    MCH 29.3 26.0 - 34.0 PG    MCHC 32.7 30.0 - 36.5 g/dL    RDW 12.6 11.5 - 14.5 %    PLATELET 160 150 - 400 K/uL    MPV 9.9 8.9 - 12.9 FL    NRBC 0.0 0 PER 100 WBC    ABSOLUTE NRBC 0.00 0.00 - 6.38 K/uL   METABOLIC PANEL, BASIC    Collection Time: 05/24/17  4:08 AM   Result Value Ref Range    Sodium 136 136 - 145 mmol/L    Potassium 4.0 3.5 - 5.1 mmol/L    Chloride 105 97 - 108 mmol/L    CO2 21 21 - 32 mmol/L    Anion gap 10 5 - 15 mmol/L    Glucose 86 65 - 100 mg/dL    BUN 21 (H) 6 - 20 MG/DL    Creatinine 0.75 0.70 - 1.30 MG/DL    BUN/Creatinine ratio 28 (H) 12 - 20      GFR est AA >60 >60 ml/min/1.38m2    GFR est non-AA >60 >60 ml/min/1.6m2    Calcium 8.0 (L) 8.5 - 10.1 MG/DL        Data Review images and reports reviewed stable postop changes     Assessment:     Active Problems:    Lung cancer (HCC) (04/27/2017)        Plan/Recommendations/Medical Decision Making:     DC CT today  AM CXR possible DC in AM     Thank you for allowing [place] to participate in the care of your patient.    Caprice Beaver, MD

## 2017-05-25 ENCOUNTER — Inpatient Hospital Stay: Admit: 2017-05-25 | Payer: Charity | Primary: Internal Medicine

## 2017-05-25 LAB — METABOLIC PANEL, BASIC
Anion gap: 8 mmol/L (ref 5–15)
BUN/Creatinine ratio: 25 — ABNORMAL HIGH (ref 12–20)
BUN: 18 MG/DL (ref 6–20)
CO2: 26 mmol/L (ref 21–32)
Calcium: 8.6 MG/DL (ref 8.5–10.1)
Chloride: 101 mmol/L (ref 97–108)
Creatinine: 0.72 MG/DL (ref 0.70–1.30)
GFR est AA: >60 mL/min/{1.73_m2} (ref >60)
GFR est non-AA: >60 mL/min/{1.73_m2} (ref >60)
Glucose: 105 mg/dL — ABNORMAL HIGH (ref 65–100)
Potassium: 4.3 mmol/L (ref 3.5–5.1)
Sodium: 135 mmol/L — ABNORMAL LOW (ref 136–145)

## 2017-05-25 MED ORDER — OXYCODONE ER 10 MG TABLET,CRUSH RESISTANT,EXTENDED RELEASE 12 HR
10 mg | ORAL_TABLET | Freq: Two times a day (BID) | ORAL | 0 refills | Status: DC
Start: 2017-05-25 — End: 2017-09-08

## 2017-05-25 MED ORDER — PREDNISONE 5 MG TAB
5 mg | ORAL_TABLET | Freq: Every day | ORAL | 3 refills | Status: DC
Start: 2017-05-25 — End: 2018-01-21

## 2017-05-25 MED ORDER — SENNOSIDES 8.6 MG TAB
8.6 mg | ORAL_TABLET | Freq: Every evening | ORAL | 0 refills | Status: DC
Start: 2017-05-25 — End: 2018-08-26

## 2017-05-25 MED FILL — OXYCODONE ER 10 MG TABLET,CRUSH RESISTANT,EXTENDED RELEASE 12 HR: 10 mg | ORAL | Qty: 1

## 2017-05-25 MED FILL — NORMAL SALINE FLUSH 0.9 % INJECTION SYRINGE: INTRAMUSCULAR | Qty: 10

## 2017-05-25 MED FILL — SENNA LAX 8.6 MG TABLET: 8.6 mg | ORAL | Qty: 1

## 2017-05-25 MED FILL — KETOROLAC TROMETHAMINE 30 MG/ML INJECTION: 30 mg/mL (1 mL) | INTRAMUSCULAR | Qty: 1

## 2017-05-25 MED FILL — DOK 100 MG CAPSULE: 100 mg | ORAL | Qty: 1

## 2017-05-25 MED FILL — FLUARIX QUAD 2018-2019 (PF) 60 MCG (15 MCG X 4)/0.5 ML IM SYRINGE: 60 mcg (15 mcg x 4)/0.5 mL | INTRAMUSCULAR | Qty: 0.5

## 2017-05-25 MED FILL — ENOXAPARIN 40 MG/0.4 ML SUB-Q SYRINGE: 40 mg/0.4 mL | SUBCUTANEOUS | Qty: 0.4

## 2017-05-25 NOTE — Discharge Summary (Signed)
Physician Discharge Summary     Patient ID:  Bruce Clark  [id number]  65 y.o.  20-Jan-1951    Admit Date: 05/22/2017    Discharge Date: 05/25/2017  Admission Diagnoses: LUNG CANCER  Lung cancer [facility])    Discharge Diagnoses:  Active Problems:    Lung cancer ([place]) (04/27/2017)         Admission Condition: [place]    Discharge Condition: Fair    Last Procedure: Procedure(s):  RIGHT LOWER LOBECTOMY, MEDIASTINAL LYMPH NODE SAMPLING, RIGHT VERTICAL MINI AXILLARY THORACOTOMY, ROBOTIC EXPLORATION OF CHEST      Hospital Course:   Normal hospital course for this procedure.    Consults: None    Significant Diagnostic Studies: labs, radiology: CXR and cardiac graphics: Telemetry  PATH: Invasive Adenocarcinoma T3, N2   TUMOR STAGING: IIIB  Disposition: home    Patient Instructions:   Cannot display discharge medications since this patient is not currently admitted.    Activity: Activity as tolerated and No lifting, Driving, or Strenuous exercise for 2-3 weeks  Diet: Resume previous diet  Wound Care: As directed    Follow-up with Thoracic Surgery in 2 weeks.  Follow-up tests/labs CXR    Sign:  Dorann Lodge, NP

## 2017-05-25 NOTE — Progress Notes (Addendum)
Reason for Admission:                    [place] robotic right lower lobectomy   PCP is Dr Melony Overly and has regular office visits (04/09/17 last office visit)  AMD in chart       RRAT Score:      8               Plan for utilizing home health:     Not indicated                        Likelihood of Readmission:  Lower to moderate due to cancer                         Transition of Care Plan:      Home with friends and medical follow up    CM met with patient in his room to introduce self and explain role.  Patient confirmed that he is being discharged today and his friend Jaclyn Shaggy MPOA and lives with him will transport home.  Patient lives in a rented one level home with his friend Shelly Flatten [phone] and [name] ([place]) [phone].  Patient is self care and ambulates without assistance.  He has no family and said his two friends transport him to appointments and other daily activities.     Patient has no insurance-- Has care card from [facility].   He secures medications from mail order-- he has patient assistance from pharmaceutical companies-- Wynetta Emery and [place] for [place], Dina Rich for [employer].  He said he receives his medications in the mail.  He purchases his pain medications.  He said it is usually $9.    Patient receives SS benefits-- $710 a month.      No transition of care needs identified at this time. Friend will transport him home and he will have medical follow up with oncologist.                   Care Management Interventions  PCP Verified by CM: Yes  Last Visit to PCP: 04/09/17  Mode of Transport at Discharge:  (car with friend)  Transition of Care Consult (CM Consult): Discharge Planning  Discharge Durable Medical Equipment: No  Occupational Therapy Consult: No  Current Support Network: Other (live with 2 friends in one level rented home.  Good friend support   says he has no family.  AMD in chart.  two friends are MPOA)  Confirm Follow Up Transport: Friends   Plan discussed with Pt/Family/Caregiver: Yes  Freedom of Choice Offered: Yes  Discharge Location  Discharge Placement: Home

## 2017-05-25 NOTE — Progress Notes (Signed)
[facility] Progress Note    Admit Date: 05/22/2017  POD: 3 Days Post-Op      Procedure:  Procedure(s):  RIGHT LOWER LOBECTOMY, MEDIASTINAL LYMPH NODE SAMPLING, RIGHT VERTICAL MINI AXILLARY THORACOTOMY, ROBOTIC EXPLORATION OF CHEST      Subjective:     Patient has complaints: No significant medical complaints    Review of Systems:    CARDIAC: negative  RESP: positive for lung mass  NEURO:  negative  INCISION: Clean, dry, and intact  EXT: Denies new swelling or pain in the legs or calves.  Objective:     Blood pressure 135/88, pulse 70, temperature 98.2 ??F (36.8 ??C), resp. rate 19, height 5\' 7"  (1.702 m), weight 156 lb 1.4 oz (70.8 kg), SpO2 100 %.  Temp (24hrs), Avg:98.7 ??F (37.1 ??C), Min:98.2 ??F (36.8 ??C), Max:99.6 ??F (37.6 ??C)        Hemodynamics    PAP    CO    CI    10/08 0701 - 10/08 1900  In: 82.3 [I.V.:82.3]  Out: -   10/06 1901 - 10/08 0700  In: 1916.7 [P.O.:960; I.V.:956.7]  Out: 230 [Urine:150]    EXAM:  GENERAL: VSS, afrible, alert and cooperative  HEART:  regular rate and rhythm  LUNG: clear to auscultation bilaterally, chest tube site dressing dry and intact  NEURO:  normal without focal findings  mental status, speech normal, alert and oriented x iii  INCISION: Clean, dry, and intact  EXTREMITIES:No evidence of DVT seen on physical exam.  GI/GU: Abd soft, nonterder with + bowel sounds. Voiding    Labs:  Recent Results (from the past 24 hour(s))   METABOLIC PANEL, BASIC    Collection Time: 05/25/17  2:51 AM   Result Value Ref Range    Sodium 135 (L) 136 - 145 mmol/L    Potassium 4.3 3.5 - 5.1 mmol/L    Chloride 101 97 - 108 mmol/L    CO2 26 21 - 32 mmol/L    Anion gap 8 5 - 15 mmol/L    Glucose 105 (H) 65 - 100 mg/dL    BUN 18 6 - 20 MG/DL    Creatinine 0.72 0.70 - 1.30 MG/DL    BUN/Creatinine ratio 25 (H) 12 - 20      GFR est AA >60 >60 ml/min/1.89m2    GFR est non-AA >60 >60 ml/min/1.68m2    Calcium 8.6 8.5 - 10.1 MG/DL       Assessment:   No evidence of DVT.  Active Problems:     Lung cancer ([place]) (04/27/2017)      PATH:  pending                             Plan/Recommendations:   Discharge home today    See orders    Signed By: Dorann Lodge FNP

## 2017-05-25 NOTE — Progress Notes (Signed)
Faculty or Preceptor Review of Student Work    05/25/2017  - Shift times - 0730 to 1315    The student documentation of patient care for Bruce Clark has been reviewed and approved.  All medications have been administered under the direct supervision of the faculty or preceptor.    Secundino Ginger, RN

## 2017-05-25 NOTE — Discharge Summary (Signed)
Physician Discharge Summary     Patient ID:  Demir Titsworth  [id number]  66 y.o.  January 26, 1951    Admit Date: 05/22/2017    Discharge Date: 05/25/2017  Admission Diagnoses: LUNG CANCER  Lung cancer [facility])    Discharge Diagnoses:  Active Problems:    Lung cancer ([place]) (04/27/2017)         Admission Condition: [place]    Discharge Condition: Fair    Last Procedure: Procedure(s):  RIGHT LOWER LOBECTOMY, MEDIASTINAL LYMPH NODE SAMPLING, RIGHT VERTICAL MINI AXILLARY THORACOTOMY, ROBOTIC EXPLORATION OF CHEST      Hospital Course:   Normal hospital course for this procedure.    Consults: None    Significant Diagnostic Studies: labs, radiology: CXR and cardiac graphics: Telemetry  PATH: Invasive Adenocarcinoma T3, N2   TUMOR STAGING: IIIB  Disposition: home    Patient Instructions:   Cannot display discharge medications since this patient is not currently admitted.    Activity: Activity as tolerated and No lifting, Driving, or Strenuous exercise for 2-3 weeks  Diet: Resume previous diet  Wound Care: As directed    Follow-up with Thoracic Surgery in 2 weeks.  Follow-up tests/labs CXR    Sign:  Dorann Lodge, NP

## 2017-05-25 NOTE — Progress Notes (Signed)
Problem: Discharge Planning  Goal: *Discharge to safe environment  Outcome: Resolved/Met Date Met: 05/25/17  Home with medical follow up

## 2017-05-26 NOTE — Telephone Encounter (Signed)
Call to patient.  LM Bruce Clark    Per [name], restart of eliquis should be determined by the surgeon.  Usually 2-4 days post surgery.

## 2017-05-26 NOTE — Telephone Encounter (Signed)
Pt is calling and would like a call back to discuss his medication

## 2017-06-02 NOTE — Telephone Encounter (Signed)
HIPAA verified.    Eliquis has been restarted.  Without problems  Encouraged to call with questions/concerns.

## 2017-06-04 ENCOUNTER — Encounter

## 2017-06-05 NOTE — Telephone Encounter (Signed)
Call to patient, spoke with Bruce Clark patient representative. Advised that patient to restart Zytiga and prednisone two weeks following surgery. Ms. Bruce Clark verbalized understanding and thanked for call. Patient has follow up appointment with Dr. Glo Herring on Monday 10/22.

## 2017-06-05 NOTE — Telephone Encounter (Signed)
Bruce Loan, NP  Orson Aloe, RN; Serita Sheller D      ??      FYI reminder, 10/20 is on Sunday. Please remind him.     Had surgery 10/5, was supposed to be off meds for 2 weeks after surgery.    Previous Messages

## 2017-06-08 ENCOUNTER — Inpatient Hospital Stay: Admit: 2017-06-08 | Payer: Charity | Primary: Internal Medicine

## 2017-06-08 ENCOUNTER — Ambulatory Visit: Admit: 2017-06-08 | Discharge: 2017-06-08 | Attending: Family | Primary: Internal Medicine

## 2017-06-08 DIAGNOSIS — C3431 Malignant neoplasm of lower lobe, right bronchus or lung: Secondary | ICD-10-CM

## 2017-06-08 NOTE — Progress Notes (Signed)
Subjective:      Bruce Clark is a 66 y.o. male presents for postop care .     Procedure:  05/22/17 Robotic right lower lobectomy and MLND Path: adenocarcinoma T3 N2 stage IIIB. Had known Stage IV prostate adenocarcinoma.     Appetite is fair. Eating a regular diet without difficulty.   Bowel movements are regular.  The patient is voiding without difficulty.  The patient is not having any pain..    Objective:     Visit Vitals  BP 113/75 (BP 1 Location: Right arm, BP Patient Position: Sitting)   Pulse 85   Resp 18   Ht 5\' 7"  (1.702 m)   Wt 156 lb (70.8 kg)   SpO2 96%   BMI 24.43 kg/m??       Physical Exam:  GENERAL: alert, cooperative, no distress, appears stated age, LUNG: clear to auscultation bilaterally, HEART: regular rate and rhythm, S1, S2 normal, no murmur, click, rub or gallop, EXTREMITIES:  extremities normal, atraumatic, no cyanosis or edema, SKIN: Normal. Incisions are healing well.    Data Review images and reports reviewed    Assessment:     Patient Active Problem List   Diagnosis Code   ??? DVT (deep venous thrombosis) (HCC) I82.409   ??? 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 Metastasis to bone of unknown primary (HCC) C79.51, C80.1   ??? Prostate cancer ([place]) C61   ??? Deep venous thrombosis (HCC) I82.409   ??? Financial difficulties Z59.8   ??? Deep vein thrombosis (DVT) of lower extremity (HCC) I82.409   ??? Bone metastasis (HCC) C79.51   ??? 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 Mucositis due to antineoplastic therapy K12.31   ??? 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 Lung cancer (HCC) C34.90       Doing well postoperatively.  Denies having any pain or SOB. He does have a non productive cough at times.   He is back on the Zytiga and prednisone   Explained the advanced stage of his cancer he states he is scheduled to start infusions in November.         Plan/Recommendations/Medical Decision Making:     Per the NCCN guidelines: Bruce Clark will be followed routinely with a  history and physical and imaging (CT Chest) every 6 months for the first 2 years then yearly. Will defer imaging to Dr Melony Overly. We will see him back in 3 months.     We discussed the importance of proper diet and 30 minutes/day of proper exercise.    All questions were personally answered.    Thank you for allowing [place] to participate in the care of your patient.    Tempie Hoist, [place]  Thoracic Surgery

## 2017-06-08 NOTE — Progress Notes (Signed)
Follow up for RA right lower lobectomy on 105//18.     1.Have you been to the ER, urgent care clinic since your last visit?  Hospitalized since your last visit?No    2. Have you seen or consulted any other health care providers outside of the [place] since your last visit?  Include any pap smears or colon screening. No

## 2017-06-23 ENCOUNTER — Encounter: Attending: Registered Nurse | Primary: Internal Medicine

## 2017-07-03 ENCOUNTER — Ambulatory Visit: Admit: 2017-07-03 | Discharge: 2017-07-03 | Attending: Registered Nurse | Primary: Internal Medicine

## 2017-07-03 DIAGNOSIS — C3431 Malignant neoplasm of lower lobe, right bronchus or lung: Secondary | ICD-10-CM

## 2017-07-03 MED ORDER — BENZONATATE 200 MG CAP
200 mg | ORAL_CAPSULE | Freq: Three times a day (TID) | ORAL | 1 refills | Status: AC | PRN
Start: 2017-07-03 — End: 2017-07-10

## 2017-07-03 NOTE — Progress Notes (Signed)
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 male who is on Eliquis for a h/o DVT who presented to the Emergency Department on 04/22/16 with nausea, vomiting, and abdominal pain x 2 months, decreased appetite and weight loss of 10-14 lbs.  CTA revealed bilateral lung nodules and mediastinal/hilar adenopathy. PET revealed supraclavicular adenopathy, retroperitoneal adenopathy, bilateral iliac adenopathy, mediastinal/hilar adenopathy, bony metastatic disease, right lower lobe pulmonary mass, and multiple other smaller masses in bilateral lungs. USG guided bx of supraclavicular LN showed adenocarcinoma of the prostate. He was also diagnosed and treated for CAP. PSA on 05/26/16-2400. Was discussed in tumor board, Napsin A negative and a small cell component ruled out. Initiated Degarelix  05/26/16. MRI spine with expansile C7 lesion, T12 lesion encroaching epidural space, S1-S2 lesion encroaching neural foramina. Started palliative C6-T5 and T11-L5 XRT under Dr. Leron Croak on 06/09/16. Started Zytiga 08/28/16 with evidence of response. Noted to have a  Left lower lobe lung nodule on CT done 12/22/16. Biopsy consistent with Lung adenocarcinoma. He had a mediastinoscopy with LN being negative for metastatic disease. Robotic RLL lobectomy performed on 05/22/17.     He underwent a robotic right lower lobectomy on 05/22/17 with Dr. Glo Herring. Pathology showed adenocarcinoma T3N2 stage IIIB. He comes in today for follow-up. He re-started his Zytiga and Prednisone 2 weeks after surgery. He is healing well and has some mild tenderness around surgical incision sites. No other sites of aches or pains. Denies CP or SOB. Does report a productive cough and denies fevers/chills. He is eating and drinking well without nausea or vomiting. Wants to try boost supplements.     Otherwise, complete ROS is per the symptom report form which has been scanned into the media section of the electronic medical record.    Past Medical History:   Diagnosis Date   ??? Lung cancer ([place])    ??? Prostate cancer ([place])    ??? Stroke ([place])     PT STATES, "I HAD A STROKE A LONG TIME AGO"       Past Surgical History:   Procedure Laterality Date   ??? CHEST SURGERY PROCEDURE UNLISTED  04/27/2017    BRONCHOSCOPY/MEDIASTINOSCOPY   ??? HX GI      HERNIA REPAIR   ??? HX ORTHOPAEDIC  1994    ROD IN RIGHT LEG       No Known Allergies    Current Outpatient Medications   Medication Sig Dispense Refill   ??? predniSONE (DELTASONE) 5 mg tablet Take 1 Tab by mouth daily. Restart on Jun 01, 2017 30 Tab 3   ??? senna (SENOKOT) 8.6 mg tablet Take 1 Tab by mouth nightly. Indications: constipation 30 Tab 0   ??? apixaban (ELIQUIS) 5 mg tablet Take 5 mg by mouth two (2) times a day.     ??? abiraterone (ZYTIGA) 500 mg tab Take 1,000 mg by mouth daily. (Patient taking differently: Take 1,000 mg by mouth daily. PT STOPPED TAKING MEDS) 60 Cap 6   ??? oxyCODONE ER (OXYCONTIN) 10 mg ER tablet Take 1 Tab by mouth every twelve (12) hours. Max Daily Amount: 20 mg. 30 Tab 0       Social History     Socioeconomic History   ??? Marital status: SINGLE     Spouse name: Not on file   ???  Number of children: Not on file   ??? Years of education: Not on file   ??? Highest education level: Not on file   Social Needs   ??? Financial resource strain: Not on file   ??? Food insecurity - worry: Not on file   ??? Food insecurity - inability: Not on file   ??? Transportation needs - medical: Not on file   ??? Transportation needs - non-medical: Not on file   Occupational History   ??? Not on file   Tobacco Use   ??? Smoking status: Former Smoker     Packs/day: 0.50     Years: 47.00     Pack years: 23.50     Last attempt to quit: 06/2016     Years since quitting: 1.0   ??? Smokeless tobacco: Never Used   Substance and Sexual Activity   ??? Alcohol use: No   ??? Drug use: No    ??? Sexual activity: Not on file   Other Topics Concern   ??? Not on file   Social History Narrative   ??? Not on file       Family History   Problem Relation Age of Onset   ??? Cancer Mother         COLON   ??? Cancer Father         BRAIN TUMOR   ??? No Known Problems Sister    ??? Arthritis-osteo Brother    ??? No Known Problems Sister    ??? No Known Problems Sister    ??? No Known Problems Sister    ??? No Known Problems Brother    ??? Anesth Problems Neg Hx        ROS  A review of systems was obtained and is negative except as listed in HPI.  ECOG PS is 1    Physical Examination:   Visit Vitals  BP 119/80 (BP 1 Location: Right arm, BP Patient Position: Sitting)   Pulse 77   Temp 98.8 ??F (37.1 ??C) (Oral)   Resp 20   Ht 5' 7"  (1.702 m)   Wt 148 lb 12.8 oz (67.5 kg)   SpO2 98%   BMI 23.31 kg/m??     General appearance - alert, and in no distress  Mental status - oriented to person, place, and time  Mouth - mucous membranes moist  Neck - supple, no significant adenopathy  Neurological - normal speech, no focal findings or movement disorder noted  Musculoskeletal - no joint tenderness, deformity or swelling, uses cane to walk  Extremities - peripheral pulses normal, no pedal edema, no clubbing or cyanosis  Skin - robotic incision site and previous chest tube site on right side of chest appear to be healing appropriately     LABS  Lab Results   Component Value Date/Time    WBC 6.9 05/24/2017 04:08 AM    HGB 10.0 (L) 05/24/2017 04:08 AM    HCT 30.6 (L) 05/24/2017 04:08 AM    PLATELET 160 05/24/2017 04:08 AM    MCV 89.7 05/24/2017 04:08 AM    ABS. NEUTROPHILS 2.2 05/06/2017 09:23 AM     Lab Results   Component Value Date/Time    Sodium 135 (L) 05/25/2017 02:51 AM    Potassium 4.3 05/25/2017 02:51 AM    Chloride 101 05/25/2017 02:51 AM    CO2 26 05/25/2017 02:51 AM    Glucose 105 (H) 05/25/2017 02:51 AM    BUN 18 05/25/2017 02:51 AM    Creatinine  0.72 05/25/2017 02:51 AM    GFR est AA >60 05/25/2017 02:51 AM     GFR est non-AA >60 05/25/2017 02:51 AM    Calcium 8.6 05/25/2017 02:51 AM     Lab Results   Component Value Date/Time    AST (SGOT) 11 05/06/2017 09:23 AM    Alk. phosphatase 73 05/06/2017 09:23 AM    Protein, total 6.6 05/06/2017 09:23 AM    Albumin 4.0 05/06/2017 09:23 AM    Globulin 3.7 01/09/2017 10:32 AM    A-G Ratio 1.5 05/06/2017 09:23 AM     Recent Labs     05/06/17  0923 03/23/17  0729 01/09/17  1032 11/10/16  2147 09/15/16  0920   PSA  --   --  0.1 0.2 1.2   PSALT <0.1 0.3  --   --   --        IMAGING  PET CT  03/23/17  IMPRESSION: Mass lesion right lower lobe is hypermetabolic and compatible with  the biopsy confirmed the result of adenocarcinoma. There are soft tissue  densities in the anterior abdominal wall bilaterally which are stable compared  to the prior chest abdomen pelvis CT but new compared to the prior PET/CT.  Please correlate with any recent abdominal wall surgery as these may represent  port sites. Otherwise normal tracer distribution. Stable sclerotic osseous  metastatic disease without abnormal activity.    PATHOLOGY  Supraclavicular node   CYTOLOGIC INTERPRETATION:   Adenocarcinoma, consistent with prostate primary   See comment   General Categorization   Positive for malignancy.   Specimen Adequacy   Satisfactory for evaluation.   Comment   Touch preps and a core biopsy are examined and show islands of adenocarcinoma surrounded by fibrous stroma. A panel of immunohistochemical stains was performed to evaluate site of origin. The tumor cells are focally positive for PSA and PSAP and negative for CK7, CK20, TTF-1, CDX-2 and Napsin A. Morphology and immunoprofile are most consistent with a metastatic prostatic adenocarcinoma.    Pathology from CT guided biopsy of R lung mass  Lung adenocarcinoma    Pathology from RLL lobectomy on 05/22/17:  1. Soft tissue, chest wall margin, biopsy:   Fragments of soft tissue involved by adenocarcinoma   2. Lymph node, station 7, excision:    One lymph node, positive for metastatic adenocarcinoma (1/1)   See comment   3. Lung, right lower lobe, lobectomy:   Adenocarcinoma, 6.7 cm, with involvement of parietal pleura   Four hilar lymph nodes, negative for metastatic carcinoma (0/4)     ASSESSMENT  Mr. Wherley is a 66 y.o. male with widely metastatic prostate adenocarcinoma and newly diagnosed lung adenocarcinoma who presents for follow-up today status post right lower lobe lobectomy on 05/22/17    PLAN    Prostate cancer  Castrate sensitive, treatment na??ve widely metastatic prostate cancer to lymph nodes above and below the diaphragm and bones.  Prostate adenocarcinoma diagnosed on biopsy of the supraclavicular lymph node. Discussed in tumor board and a small cell component has been ruled out.  PSA at the time of diagnosis at 2400.    He received Firmagon on 05/26/16.     Currently on Lupron 45 mg every 6 months. On Zytiga 1,059m daily + prednisone 5 mg daily per L[place]UDE.   He has continued to have a radiologic and PSA response  CBC, CMP, PSA monthly  Next Lupron on 07/13/17    R lung adenocarcinoma, T3N2MX stage IIIB- Parietal pleural involvement- PD-L1  99%  S/p RLL lobectomy on 10/5  Very high risk of recurrence  With his N2 disease PORT was discussed but toxicities outweight benefit in someone with another life limiting illness  I will obtain systemic staging scans as his last scans were on 03/23/17  If his prostate cancer is stable and he has no other progression of disease will consider adjuvant therapy  This would require [place] to hold Zytiga as the safety of combining immunotherapy or chemotherapy with Fabio Asa is not known  Will review in tumor board    Bone metastases  Diffuse  Concerning lesions at C7, T12, S1, S2 which threaten the integrity of the spinal column  Completed palliative radiation that was initiated on 06/09/16  Prednisone 88m daily  Hold off biphosphonates due to poor dental hygiene    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

## 2017-07-03 NOTE — Progress Notes (Signed)
Bruce Clark is a 66 y.o. male here today for follow up, lung cancer.       Complaints of congested cough, bringing up secretions.

## 2017-07-03 NOTE — Progress Notes (Signed)
Written instructions provided and reviewed with patient regarding 11/26, the days scheduling.  Including medications directions.  Front to scan directions provided.

## 2017-07-10 ENCOUNTER — Encounter: Payer: Charity | Primary: Internal Medicine

## 2017-07-13 ENCOUNTER — Inpatient Hospital Stay: Payer: Charity | Attending: Registered Nurse | Primary: Internal Medicine

## 2017-07-13 ENCOUNTER — Inpatient Hospital Stay: Admit: 2017-07-13 | Payer: Charity | Primary: Internal Medicine

## 2017-07-13 ENCOUNTER — Encounter

## 2017-07-13 ENCOUNTER — Inpatient Hospital Stay: Admit: 2017-07-13 | Payer: Charity | Attending: Registered Nurse | Primary: Internal Medicine

## 2017-07-13 DIAGNOSIS — C3431 Malignant neoplasm of lower lobe, right bronchus or lung: Secondary | ICD-10-CM

## 2017-07-13 DIAGNOSIS — Z5111 Encounter for antineoplastic chemotherapy: Secondary | ICD-10-CM

## 2017-07-13 LAB — METABOLIC PANEL, COMPREHENSIVE
A-G Ratio: 0.8 — ABNORMAL LOW (ref 1.1–2.2)
ALT (SGPT): 13 U/L (ref 12–78)
AST (SGOT): 13 U/L — ABNORMAL LOW (ref 15–37)
Albumin: 3.7 g/dL (ref 3.5–5.0)
Alk. phosphatase: 83 U/L (ref 45–117)
Anion gap: 6 mmol/L (ref 5–15)
BUN/Creatinine ratio: 18 (ref 12–20)
BUN: 12 MG/DL (ref 6–20)
Bilirubin, total: 0.4 MG/DL (ref 0.2–1.0)
CO2: 26 mmol/L (ref 21–32)
Calcium: 9.5 MG/DL (ref 8.5–10.1)
Chloride: 107 mmol/L (ref 97–108)
Creatinine: 0.68 MG/DL — ABNORMAL LOW (ref 0.70–1.30)
GFR est AA: >60 mL/min/{1.73_m2} (ref >60)
GFR est non-AA: >60 mL/min/{1.73_m2} (ref >60)
Globulin: 4.4 g/dL — ABNORMAL HIGH (ref 2.0–4.0)
Glucose: 67 mg/dL (ref 65–100)
Potassium: 3.6 mmol/L (ref 3.5–5.1)
Protein, total: 8.1 g/dL (ref 6.4–8.2)
Sodium: 139 mmol/L (ref 136–145)

## 2017-07-13 LAB — CBC WITH AUTOMATED DIFF
ABS. BASOPHILS: 0 10*3/uL (ref 0.0–0.1)
ABS. EOSINOPHILS: 0.1 10*3/uL (ref 0.0–0.4)
ABS. IMM. GRANS.: 0 10*3/uL (ref 0.00–0.04)
ABS. LYMPHOCYTES: 1.1 10*3/uL (ref 0.8–3.5)
ABS. MONOCYTES: 0.5 10*3/uL (ref 0.0–1.0)
ABS. NEUTROPHILS: 2.1 10*3/uL (ref 1.8–8.0)
ABSOLUTE NRBC: 0 10*3/uL (ref 0.00–0.01)
BASOPHILS: 0 % (ref 0–1)
EOSINOPHILS: 3 % (ref 0–7)
HCT: 33.8 % — ABNORMAL LOW (ref 36.6–50.3)
HGB: 10.9 g/dL — ABNORMAL LOW (ref 12.1–17.0)
IMMATURE GRANULOCYTES: 0 % (ref 0.0–0.5)
LYMPHOCYTES: 29 % (ref 12–49)
MCH: 28.8 PG (ref 26.0–34.0)
MCHC: 32.2 g/dL (ref 30.0–36.5)
MCV: 89.2 FL (ref 80.0–99.0)
MONOCYTES: 13 % (ref 5–13)
MPV: 8.9 FL (ref 8.9–12.9)
NEUTROPHILS: 55 % (ref 32–75)
NRBC: 0 PER 100 WBC
PLATELET: 225 10*3/uL (ref 150–400)
RBC: 3.79 M/uL — ABNORMAL LOW (ref 4.10–5.70)
RDW: 13.9 % (ref 11.5–14.5)
WBC: 3.9 10*3/uL — ABNORMAL LOW (ref 4.1–11.1)

## 2017-07-13 LAB — PSA, DIAGNOSTIC (PROSTATE SPECIFIC AG): Prostate Specific Ag: 0.1 ng/mL (ref 0.01–4.00)

## 2017-07-13 MED ORDER — IOHEXOL 240 MG/ML IV SOLN
240 mg iodine/mL | Freq: Once | INTRAVENOUS | Status: AC
Start: 2017-07-13 — End: 2017-07-13
  Administered 2017-07-13: 16:00:00 via ORAL

## 2017-07-13 MED ORDER — TECHNETIUM TC 99M MEDRONATE IV KIT
Freq: Once | Status: AC
Start: 2017-07-13 — End: 2017-07-13
  Administered 2017-07-13: 17:00:00 via INTRAVENOUS

## 2017-07-13 MED ORDER — SODIUM CHLORIDE 0.9 % IJ SYRG
Freq: Once | INTRAMUSCULAR | Status: AC
Start: 2017-07-13 — End: 2017-07-13
  Administered 2017-07-13: 16:00:00 via INTRAVENOUS

## 2017-07-13 MED ORDER — LEUPROLIDE (6 MONTH) 45 MG IM SYRINGE KIT
45 mg | Freq: Once | INTRAMUSCULAR | Status: AC
Start: 2017-07-13 — End: 2017-07-13
  Administered 2017-07-13: 13:00:00 via INTRAMUSCULAR

## 2017-07-13 MED ORDER — SODIUM CHLORIDE 0.9% BOLUS IV
0.9 % | Freq: Once | INTRAVENOUS | Status: AC
Start: 2017-07-13 — End: 2017-07-13
  Administered 2017-07-13: 16:00:00 via INTRAVENOUS

## 2017-07-13 MED ORDER — IOPAMIDOL 76 % IV SOLN
370 mg iodine /mL (76 %) | Freq: Once | INTRAVENOUS | Status: AC
Start: 2017-07-13 — End: 2017-07-13
  Administered 2017-07-13: 16:00:00 via INTRAVENOUS

## 2017-07-13 MED FILL — OMNIPAQUE 240 MG IODINE/ML INTRAVENOUS SOLUTION: 240 mg iodine/mL | INTRAVENOUS | Qty: 50

## 2017-07-13 MED FILL — SODIUM CHLORIDE 0.9 % IV: INTRAVENOUS | Qty: 100

## 2017-07-13 MED FILL — LUPRON DEPOT 45 MG (6 MONTH) INTRAMUSCULAR SYRINGE KIT: 45 mg | INTRAMUSCULAR | Qty: 1

## 2017-07-13 MED FILL — NORMAL SALINE FLUSH 0.9 % INJECTION SYRINGE: INTRAMUSCULAR | Qty: 10

## 2017-07-13 NOTE — Progress Notes (Signed)
Outpatient Infusion Center Progress Note    0800 Pt admitted to [facility] for Lupron injection. Pt ambulatory in stable condition. Assessment completed. No new concerns voiced. Patient has CT today and has been NPO since last night.    Labs drawn and sent per beacon order.    Visit Vitals  BP 122/82   Pulse 84   Temp 98.9 ??F (37.2 ??C)   Resp 18       Medications:  Lupron-IM-Leftt deltoid    0830 Pt tolerated treatment well. D/c home ambulatory in no distress. Pt aware of next appointment scheduled for 12/28/17 at 0830.

## 2017-07-13 NOTE — Progress Notes (Signed)
Please let him know CT scans look good and remind him of follow-up apt on 12/7

## 2017-07-14 ENCOUNTER — Encounter

## 2017-07-14 NOTE — Addendum Note (Signed)
Addended byMare Loan on: 07/14/2017 02:27 PM     Modules accepted: Orders

## 2017-07-14 NOTE — Progress Notes (Signed)
Discussed in tumor board  Scans are good    Consensus is for him to receive adjuvant radiation for lung cancer- Sarah please refer to Dr. Ethel Rana    No adjuvant chemotherapy    Keep current appointment with me    Let patient know

## 2017-07-15 NOTE — Telephone Encounter (Signed)
Call received from patient, HIPAA verified. Advised of note by provider and appointment with radiation oncology. Patient requested writer speak with Bruce Clark as she handles his affairs. Advised call was placed, have not been able to reach as of yet. Patient with Bruce Clark, handed over phone, HIPAA verified. Advised of same information, appointment information provided, will make sure patient makes appointment on 12/4 with Dr. Leron Clark. Thanked for assistance.

## 2017-07-15 NOTE — Telephone Encounter (Addendum)
Call placed to patient, no answer, voice mail has not been set up, unable to leave message.    Call placed to patient contact, Holley Bouche to inform of patient update/appt with radiation oncology on Tues 07/21/17 at 2pm with Dr. Leron Croak at [facility], will need to report to [facility] admitting department for check in.     Message left for return call for update.             Juliene Pina, MD Yesterday (12:35 PM)         Discussed in tumor board  Scans are good  ??  Consensus is for him to receive adjuvant radiation for lung cancer- Sarah please refer to Dr. Ethel Rana  ??  No adjuvant chemotherapy  ??  Keep current appointment with me  ??  Let patient know         Documentation

## 2017-07-15 NOTE — Progress Notes (Signed)
Call to patient, HIPAA verified. Advised of note by provider, thanked for call.

## 2017-07-24 ENCOUNTER — Ambulatory Visit: Admit: 2017-07-24 | Discharge: 2017-07-24 | Attending: Registered Nurse | Primary: Internal Medicine

## 2017-07-24 DIAGNOSIS — C3431 Malignant neoplasm of lower lobe, right bronchus or lung: Secondary | ICD-10-CM

## 2017-07-24 MED ORDER — MAGIC MOUTHWASH AMBULATORY SIMPLE MEDICATION
ORAL | 1 refills | Status: DC
Start: 2017-07-24 — End: 2017-08-25

## 2017-07-24 NOTE — Progress Notes (Signed)
Bruce Clark is a 66 y.o. male here today for follow up, lung cancer.    1. Have you been to the ER, urgent care clinic since your last visit?  Hospitalized since your last visit? No    2. Have you seen or consulted any other health care providers outside of the [facility] since your last visit?  Include any pap smears or colon screening. Seen by radiation oncology yesterday

## 2017-07-24 NOTE — Progress Notes (Signed)
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 male who is on Eliquis for a h/o DVT who presented to the Emergency Department on 04/22/16 with nausea, vomiting, and abdominal pain x 2 months, decreased appetite and weight loss of 10-14 lbs.  CTA revealed bilateral lung nodules and mediastinal/hilar adenopathy. PET revealed supraclavicular adenopathy, retroperitoneal adenopathy, bilateral iliac adenopathy, mediastinal/hilar adenopathy, bony metastatic disease, right lower lobe pulmonary mass, and multiple other smaller masses in bilateral lungs. USG guided bx of supraclavicular LN showed adenocarcinoma of the prostate. He was also diagnosed and treated for CAP. PSA on 05/26/16-2400. Was discussed in tumor board, Napsin A negative and a small cell component ruled out. Initiated Degarelix  05/26/16. MRI spine with expansile C7 lesion, T12 lesion encroaching epidural space, S1-S2 lesion encroaching neural foramina. Started palliative C6-T5 and T11-L5 XRT under Dr. Leron Croak on 06/09/16. Started Zytiga 08/28/16 with evidence of response. Noted to have a  Left lower lobe lung nodule on CT done 12/22/16. Biopsy consistent with Lung adenocarcinoma. He had a mediastinoscopy with LN being negative for metastatic disease. Robotic RLL lobectomy performed on 05/22/17.     He underwent a robotic right lower lobectomy on 05/22/17 with Dr. Glo Herring. Pathology showed adenocarcinoma T3N2 stage IIIB. Case discussed at tumor board and there is no plan for adjuvant chemotherapy at this time. He has been referred for adjuvant radiation and saw Dr. Leron Croak yesterday.     He comes in today for follow-up. He continues to take Zytiga and Prednisone and last Lupron shot was 07/13/17. He continues to have some  mild tenderness around surgical site but this is overall improving. No other aches or pains. He denies SOB or CP and continues to walk daily. He denies nausea/vomiting or constipation/diarrhea and is eating and drinking well. He denies abnormal bleeding or blood in his urine or stool.      Otherwise, complete ROS is per the symptom report form which has been scanned into the media section of the electronic medical record.    Past Medical History:   Diagnosis Date   ??? Lung cancer ([place])    ??? Prostate cancer ([place])    ??? Stroke ([place])     Bruce Clark STATES, "I HAD A STROKE A LONG TIME AGO"       Past Surgical History:   Procedure Laterality Date   ??? CHEST SURGERY PROCEDURE UNLISTED  04/27/2017    BRONCHOSCOPY/MEDIASTINOSCOPY   ??? HX GI      HERNIA REPAIR   ??? HX ORTHOPAEDIC  1994    ROD IN RIGHT LEG       No Known Allergies    Current Outpatient Medications   Medication Sig Dispense Refill   ??? predniSONE (DELTASONE) 5 mg tablet Take 1 Tab by mouth daily. Restart on Jun 01, 2017 30 Tab 3   ??? senna (SENOKOT) 8.6 mg tablet Take 1 Tab by mouth nightly. Indications: constipation 30 Tab 0   ??? apixaban (ELIQUIS) 5 mg tablet Take 5 mg by mouth two (2) times a day.     ??? abiraterone (ZYTIGA) 500 mg tab Take 1,000 mg by mouth daily. (Patient taking differently: Take 1,000 mg by mouth daily. Bruce Clark STOPPED TAKING MEDS) 60 Cap 6   ??? oxyCODONE ER (OXYCONTIN) 10 mg ER tablet Take 1 Tab by mouth every  twelve (12) hours. Max Daily Amount: 20 mg. 30 Tab 0       Social History     Socioeconomic History   ??? Marital status: SINGLE     Spouse name: Not on file   ??? Number of children: Not on file   ??? Years of education: Not on file   ??? Highest education level: Not on file   Tobacco Use   ??? Smoking status: Former Smoker     Packs/day: 0.50     Years: 47.00     Pack years: 23.50     Last attempt to quit: 06/2016     Years since quitting: 1.0   ??? Smokeless tobacco: Never Used   Substance and Sexual Activity   ??? Alcohol use: No   ??? Drug use: No        Family History   Problem Relation Age of Onset   ??? Cancer Mother         COLON   ??? Cancer Father         BRAIN TUMOR   ??? No Known Problems Sister    ??? Arthritis-osteo Brother    ??? No Known Problems Sister    ??? No Known Problems Sister    ??? No Known Problems Sister    ??? No Known Problems Brother    ??? Anesth Problems Neg Hx        ROS  A review of systems was obtained and is negative except as listed in HPI.  ECOG PS is 1    Physical Examination:   Visit Vitals  BP 149/82 (BP 1 Location: Right arm, BP Patient Position: Sitting)   Pulse 64   Temp 98.4 ??F (36.9 ??C) (Oral)   Resp 16   Ht 5' 7"  (1.702 m)   Wt 149 lb 6.4 oz (67.8 kg)   SpO2 99%   BMI 23.40 kg/m??     General appearance - alert, and in no distress  Mental status - oriented to person, place, and time  Mouth - mucous membranes moist  Neck - supple, no significant adenopathy  Neurological - normal speech, no focal findings or movement disorder noted  Musculoskeletal - no joint tenderness, deformity or swelling, uses cane to walk  Extremities - peripheral pulses normal, no pedal edema, no clubbing or cyanosis  Skin - robotic incision site and previous chest tube site on right side of chest appear to be healing appropriately    LABS  Lab Results   Component Value Date/Time    WBC 3.9 (L) 07/13/2017 07:53 AM    HGB 10.9 (L) 07/13/2017 07:53 AM    HCT 33.8 (L) 07/13/2017 07:53 AM    PLATELET 225 07/13/2017 07:53 AM    MCV 89.2 07/13/2017 07:53 AM    ABS. NEUTROPHILS 2.1 07/13/2017 07:53 AM     Lab Results   Component Value Date/Time    Sodium 139 07/13/2017 07:53 AM    Potassium 3.6 07/13/2017 07:53 AM    Chloride 107 07/13/2017 07:53 AM    CO2 26 07/13/2017 07:53 AM    Glucose 67 07/13/2017 07:53 AM    BUN 12 07/13/2017 07:53 AM    Creatinine 0.68 (L) 07/13/2017 07:53 AM    GFR est AA >60 07/13/2017 07:53 AM    GFR est non-AA >60 07/13/2017 07:53 AM    Calcium 9.5 07/13/2017 07:53 AM     Lab Results   Component Value Date/Time     AST (SGOT) 13 (L)  07/13/2017 07:53 AM    Alk. phosphatase 83 07/13/2017 07:53 AM    Protein, total 8.1 07/13/2017 07:53 AM    Albumin 3.7 07/13/2017 07:53 AM    Globulin 4.4 (H) 07/13/2017 07:53 AM    A-G Ratio 0.8 (L) 07/13/2017 07:53 AM     Recent Labs     07/13/17  0753 05/06/17  0923 03/23/17  0729 01/09/17  1032 11/10/16  2147   PSA 0.1  --   --  0.1 0.2   PSALT  --  <0.1 0.3  --   --        IMAGING  PET CT  03/23/17  IMPRESSION: Mass lesion right lower lobe is hypermetabolic and compatible with  the biopsy confirmed the result of adenocarcinoma. There are soft tissue  densities in the anterior abdominal wall bilaterally which are stable compared  to the prior chest abdomen pelvis CT but new compared to the prior PET/CT.  Please correlate with any recent abdominal wall surgery as these may represent  port sites. Otherwise normal tracer distribution. Stable sclerotic osseous  metastatic disease without abnormal activity.    PATHOLOGY  Supraclavicular node   CYTOLOGIC INTERPRETATION:   Adenocarcinoma, consistent with prostate primary   See comment   General Categorization   Positive for malignancy.   Specimen Adequacy   Satisfactory for evaluation.   Comment   Touch preps and a core biopsy are examined and show islands of adenocarcinoma surrounded by fibrous stroma. A panel of immunohistochemical stains was performed to evaluate site of origin. The tumor cells are focally positive for PSA and PSAP and negative for CK7, CK20, TTF-1, CDX-2 and Napsin A. Morphology and immunoprofile are most consistent with a metastatic prostatic adenocarcinoma.    Pathology from CT guided biopsy of R lung mass  Lung adenocarcinoma    Pathology from RLL lobectomy on 05/22/17:  1. Soft tissue, chest wall margin, biopsy:   Fragments of soft tissue involved by adenocarcinoma   2. Lymph node, station 7, excision:   One lymph node, positive for metastatic adenocarcinoma (1/1)   See comment   3. Lung, right lower lobe, lobectomy:    Adenocarcinoma, 6.7 cm, with involvement of parietal pleura   Four hilar lymph nodes, negative for metastatic carcinoma (0/4)     Bone scan 07/13/17:  IMPRESSION: Metastatic disease thoracic and lumbar spine.    CT neck/chest/abd/pelvis 07/13/17:  IMPRESSION:  1. CT of the neck demonstrates no adenopathy. The supraglottic airway is close  related to technique.. There is mild asymmetry in the true vocal cords right is  more prominent than the left and correlation would be helpful.  2. CT of the chest demonstrates right lower lobe lobectomy changes. There is  pleural thickening in the right mid and lower thorax posteriorly most likely  postoperative. There is also slight soft tissue prominence at the postoperative  site in the right hilum again most likely postsurgical but close follow-up would  be helpful.  3. CT of the abdomen and pelvis demonstrates no adenopathy or mass. Slight  nodularity in the prostate gland is stable.  4. Bony metastatic disease to the spine is stable in appearance.    ASSESSMENT  Bruce Clark is a 66 y.o. male with widely metastatic prostate adenocarcinoma and newly diagnosed lung adenocarcinoma status post right lower lobe lobectomy on 05/22/17 and presents today for follow-up.     PLAN    Prostate cancer  Castrate sensitive, treatment na??ve widely metastatic prostate cancer to lymph nodes above  and below the diaphragm and bones.  Prostate adenocarcinoma diagnosed on biopsy of the supraclavicular lymph node. Discussed in tumor board and a small cell component has been ruled out.  PSA at the time of diagnosis at 2400.    He received Firmagon on 05/26/16.     Currently on Lupron 45 mg every 6 months. On Zytiga 1,023m daily + prednisone 5 mg daily per LATTITUDE.   He has continued to have a radiologic and PSA response  CBC, CMP, PSA monthly in [facility]  Lupron q6 months    R lung adenocarcinoma, T3N2MX stage IIIB- Parietal pleural involvement- PD-L1 99%  S/p RLL lobectomy on 10/5   Very high risk of recurrence  CT neck/chest/abd/pelvis reviewed in tumor board.   Case was discussed in tumor board and the consensus was to offer PORT with N2 disease  I shared my concerns about toxicity and encouraged him to have a detailed discussion/ clear understanding of pros and cons when he meets with Dr. PLeron Croak  We will continue Zytiga and Prednisone during radiation  Magic mouthwash rx in anticipation of radiation toxicities     As regards adjuvant chemotherapy- we elected to not pursue this with another life limiting illness. 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

## 2017-07-25 ENCOUNTER — Encounter

## 2017-07-29 ENCOUNTER — Inpatient Hospital Stay: Admit: 2017-07-29 | Payer: Charity | Attending: Radiation Oncology | Primary: Internal Medicine

## 2017-07-29 DIAGNOSIS — C3431 Malignant neoplasm of lower lobe, right bronchus or lung: Secondary | ICD-10-CM

## 2017-07-30 MED ORDER — GADOTERATE MEGLUMINE 0.5 MMOL/ML IV SOLUTION
0.5 mmol/mL (376.9 mg/mL) | Freq: Once | INTRAVENOUS | Status: AC
Start: 2017-07-30 — End: 2017-07-29
  Administered 2017-07-30: 01:00:00 via INTRAVENOUS

## 2017-07-30 MED FILL — DOTAREM 0.5 MMOL/ML (376.9 MG/ML) INTRAVENOUS SOLUTION: 0.5 mmol/mL (376.9 mg/mL) | INTRAVENOUS | Qty: 15

## 2017-08-24 NOTE — Telephone Encounter (Signed)
Called patient reference patient assistance.  Forms need to be signed before submitting.  Mailbox not set up, unable to leave message.  Will continue to try to reach patient.

## 2017-08-25 ENCOUNTER — Ambulatory Visit
Admit: 2017-08-25 | Discharge: 2017-08-25 | Payer: PRIVATE HEALTH INSURANCE | Attending: Registered Nurse | Primary: Internal Medicine

## 2017-08-25 ENCOUNTER — Inpatient Hospital Stay: Admit: 2017-08-25 | Payer: Charity | Primary: Internal Medicine

## 2017-08-25 DIAGNOSIS — C61 Malignant neoplasm of prostate: Secondary | ICD-10-CM

## 2017-08-25 LAB — CBC WITH AUTOMATED DIFF
ABS. BASOPHILS: 0 10*3/uL (ref 0.0–0.1)
ABS. EOSINOPHILS: 0.2 10*3/uL (ref 0.0–0.4)
ABS. IMM. GRANS.: 0 10*3/uL (ref 0.00–0.04)
ABS. LYMPHOCYTES: 0.4 10*3/uL — ABNORMAL LOW (ref 0.8–3.5)
ABS. MONOCYTES: 0.4 10*3/uL (ref 0.0–1.0)
ABS. NEUTROPHILS: 1.9 10*3/uL (ref 1.8–8.0)
ABSOLUTE NRBC: 0 10*3/uL (ref 0.00–0.01)
BASOPHILS: 1 % (ref 0–1)
EOSINOPHILS: 6 % (ref 0–7)
HCT: 34.7 % — ABNORMAL LOW (ref 36.6–50.3)
HGB: 11.8 g/dL — ABNORMAL LOW (ref 12.1–17.0)
IMMATURE GRANULOCYTES: 1 % — ABNORMAL HIGH (ref 0.0–0.5)
LYMPHOCYTES: 14 % (ref 12–49)
MCH: 29.1 PG (ref 26.0–34.0)
MCHC: 34 g/dL (ref 30.0–36.5)
MCV: 85.7 FL (ref 80.0–99.0)
MONOCYTES: 13 % (ref 5–13)
MPV: 8.8 FL — ABNORMAL LOW (ref 8.9–12.9)
NEUTROPHILS: 65 % (ref 32–75)
NRBC: 0 PER 100 WBC
PLATELET: 188 10*3/uL (ref 150–400)
RBC: 4.05 M/uL — ABNORMAL LOW (ref 4.10–5.70)
RDW: 13.1 % (ref 11.5–14.5)
WBC: 2.9 10*3/uL — ABNORMAL LOW (ref 4.1–11.1)

## 2017-08-25 LAB — METABOLIC PANEL, COMPREHENSIVE
A-G Ratio: 0.8 — ABNORMAL LOW (ref 1.1–2.2)
ALT (SGPT): 9 U/L — ABNORMAL LOW (ref 12–78)
AST (SGOT): 17 U/L (ref 15–37)
Albumin: 3.5 g/dL (ref 3.5–5.0)
Alk. phosphatase: 95 U/L (ref 45–117)
Anion gap: 7 mmol/L (ref 5–15)
BUN/Creatinine ratio: 18 (ref 12–20)
BUN: 10 MG/DL (ref 6–20)
Bilirubin, total: 0.3 MG/DL (ref 0.2–1.0)
CO2: 26 mmol/L (ref 21–32)
Calcium: 9.3 MG/DL (ref 8.5–10.1)
Chloride: 105 mmol/L (ref 97–108)
Creatinine: 0.55 MG/DL — ABNORMAL LOW (ref 0.70–1.30)
GFR est AA: >60 mL/min/{1.73_m2} (ref >60)
GFR est non-AA: >60 mL/min/{1.73_m2} (ref >60)
Globulin: 4.4 g/dL — ABNORMAL HIGH (ref 2.0–4.0)
Glucose: 89 mg/dL (ref 65–100)
Potassium: 3.5 mmol/L (ref 3.5–5.1)
Protein, total: 7.9 g/dL (ref 6.4–8.2)
Sodium: 138 mmol/L (ref 136–145)

## 2017-08-25 LAB — PSA, DIAGNOSTIC (PROSTATE SPECIFIC AG): Prostate Specific Ag: 0.1 ng/mL (ref 0.01–4.00)

## 2017-08-25 MED ORDER — MAGIC MOUTHWASH AMBULATORY SIMPLE MEDICATION
ORAL | 3 refills | Status: DC
Start: 2017-08-25 — End: 2018-08-26

## 2017-08-25 MED ORDER — POLYETHYLENE GLYCOL 3350 17 GRAM (100 %) ORAL POWDER PACKET
17 gram | PACK | Freq: Every day | ORAL | 2 refills | Status: DC
Start: 2017-08-25 — End: 2018-08-26

## 2017-08-25 NOTE — Progress Notes (Signed)
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 male who is on Eliquis for a h/o DVT who presented to the Emergency Department on 04/22/16 with nausea, vomiting, and abdominal pain x 2 months, decreased appetite and weight loss of 10-14 lbs.  CTA revealed bilateral lung nodules and mediastinal/hilar adenopathy. PET revealed supraclavicular adenopathy, retroperitoneal adenopathy, bilateral iliac adenopathy, mediastinal/hilar adenopathy, bony metastatic disease, right lower lobe pulmonary mass, and multiple other smaller masses in bilateral lungs. USG guided bx of supraclavicular LN showed adenocarcinoma of the prostate. He was also diagnosed and treated for CAP. PSA on 05/26/16-2400. Was discussed in tumor board, Napsin A negative and a small cell component ruled out. Initiated Degarelix  05/26/16. MRI spine with expansile C7 lesion, T12 lesion encroaching epidural space, S1-S2 lesion encroaching neural foramina. Started palliative C6-T5 and T11-L5 XRT under Dr. Leron Croak on 06/09/16. Started Zytiga 08/28/16 with evidence of response. Noted to have a  Left lower lobe lung nodule on CT done 12/22/16. Biopsy consistent with Lung adenocarcinoma. He had a mediastinoscopy with LN being negative for metastatic disease. Robotic RLL lobectomy performed on 05/22/17.     He underwent a robotic right lower lobectomy on 05/22/17 with Dr. Glo Herring. Pathology showed adenocarcinoma T3N2 stage IIIB. Case discussed at tumor board and there is no plan for adjuvant chemotherapy at this time. Currently  undergoing post op radiation with Dr. Leron Croak.     He comes in today for follow-up. He continues to take Zytiga and Prednisone and last Lupron shot was 07/13/17. He has some mild tenderness at his surgical site that is unchanged. No other aches or pains. Has  intermittent constipation. He denies nausea/vomiting and is eating and drinking well. Uses mouthwash three times daily but denies mouth sores. He denies abnormal bleeding or blood in his urine/stool. Denies dysphagia. His last radiation treatment is on 09/17/17.     Otherwise, complete ROS is per the symptom report form which has been scanned into the media section of the electronic medical record.    Past Medical History:   Diagnosis Date   ??? Lung cancer ([place])    ??? Prostate cancer ([place])    ??? Stroke ([place])     PT STATES, "I HAD A STROKE A LONG TIME AGO"       Past Surgical History:   Procedure Laterality Date   ??? CHEST SURGERY PROCEDURE UNLISTED  04/27/2017    BRONCHOSCOPY/MEDIASTINOSCOPY   ??? HX GI      HERNIA REPAIR   ??? HX ORTHOPAEDIC  1994    ROD IN RIGHT LEG       No Known Allergies    Current Outpatient Medications   Medication Sig Dispense Refill   ??? predniSONE (DELTASONE) 5 mg tablet Take 1 Tab by mouth daily. Restart on Jun 01, 2017 30 Tab 3   ??? senna (SENOKOT) 8.6 mg tablet Take 1 Tab by mouth nightly. Indications: constipation 30 Tab 0   ??? apixaban (ELIQUIS) 5 mg tablet Take 5 mg by mouth two (2) times a day.     ??? abiraterone (ZYTIGA) 500 mg tab Take 1,000 mg by mouth daily. (Patient taking differently: Take 1,000 mg by mouth daily. PT STOPPED TAKING MEDS) 60 Cap 6   ??? magic mouthwash solution Magic mouth wash   Maalox  Lidocaine 2% viscous  Diphenhydramine oral solution     Pharmacy to mix equal portions of ingredients to a total volume as indicated in the dispense amount.  Swish and spit 41m (1 tsp) four times daily as needed for mouth sores 480 mL 1   ??? oxyCODONE ER (OXYCONTIN) 10 mg ER tablet Take 1 Tab by mouth every twelve (12) hours. Max Daily Amount: 20 mg. 30 Tab 0       Social History     Socioeconomic History   ??? Marital status: SINGLE     Spouse name: Not on file   ??? Number of children: Not on file   ??? Years of education: Not on file   ??? Highest education level: Not on file   Tobacco Use    ??? Smoking status: Former Smoker     Packs/day: 0.50     Years: 47.00     Pack years: 23.50     Last attempt to quit: 06/2016     Years since quitting: 1.1   ??? Smokeless tobacco: Never Used   Substance and Sexual Activity   ??? Alcohol use: No   ??? Drug use: No       Family History   Problem Relation Age of Onset   ??? Cancer Mother         COLON   ??? Cancer Father         BRAIN TUMOR   ??? No Known Problems Sister    ??? Arthritis-osteo Brother    ??? No Known Problems Sister    ??? No Known Problems Sister    ??? No Known Problems Sister    ??? No Known Problems Brother    ??? Anesth Problems Neg Hx        ROS  A review of systems was obtained and is negative except as listed in HPI.  ECOG PS is 1    Physical Examination:   Visit Vitals  Ht 5' 7"  (1.702 m)   Wt 144 lb 12.8 oz (65.7 kg)   BMI 22.68 kg/m??     General appearance - alert, and in no distress  Mental status - oriented to person, place, and time  Mouth - mucous membranes moist  Neck - supple, no significant adenopathy  Neurological - normal speech, no focal findings or movement disorder noted  Musculoskeletal - no joint tenderness, deformity or swelling  Extremities - peripheral pulses normal, no pedal edema, no clubbing or cyanosis  Skin - robotic incision site and previous chest tube site on right side of chest appear to be healing appropriately    LABS  Lab Results   Component Value Date/Time    WBC 3.9 (L) 07/13/2017 07:53 AM    HGB 10.9 (L) 07/13/2017 07:53 AM    HCT 33.8 (L) 07/13/2017 07:53 AM    PLATELET 225 07/13/2017 07:53 AM    MCV 89.2 07/13/2017 07:53 AM    ABS. NEUTROPHILS 2.1 07/13/2017 07:53 AM     Lab Results   Component Value Date/Time    Sodium 139 07/13/2017 07:53 AM    Potassium 3.6 07/13/2017 07:53 AM    Chloride 107 07/13/2017 07:53 AM    CO2 26 07/13/2017 07:53 AM    Glucose 67 07/13/2017 07:53 AM    BUN 12 07/13/2017 07:53 AM    Creatinine 0.68 (L) 07/13/2017 07:53 AM    GFR est AA >60 07/13/2017 07:53 AM    GFR est non-AA >60 07/13/2017 07:53 AM     Calcium 9.5  07/13/2017 07:53 AM     Lab Results   Component Value Date/Time    AST (SGOT) 13 (L) 07/13/2017 07:53 AM    Alk. phosphatase 83 07/13/2017 07:53 AM    Protein, total 8.1 07/13/2017 07:53 AM    Albumin 3.7 07/13/2017 07:53 AM    Globulin 4.4 (H) 07/13/2017 07:53 AM    A-G Ratio 0.8 (L) 07/13/2017 07:53 AM     Recent Labs     08/25/17  0959 07/13/17  0753 05/06/17  0923 03/23/17  0729 01/09/17  1032 11/10/16  2147   PSA 0.1 0.1  --   --  0.1 0.2   PSALT  --   --  <0.1 0.3  --   --        IMAGING  PET CT  03/23/17  IMPRESSION: Mass lesion right lower lobe is hypermetabolic and compatible with  the biopsy confirmed the result of adenocarcinoma. There are soft tissue  densities in the anterior abdominal wall bilaterally which are stable compared  to the prior chest abdomen pelvis CT but new compared to the prior PET/CT.  Please correlate with any recent abdominal wall surgery as these may represent  port sites. Otherwise normal tracer distribution. Stable sclerotic osseous  metastatic disease without abnormal activity.    PATHOLOGY  Supraclavicular node   CYTOLOGIC INTERPRETATION:   Adenocarcinoma, consistent with prostate primary   See comment   General Categorization   Positive for malignancy.   Specimen Adequacy   Satisfactory for evaluation.   Comment   Touch preps and a core biopsy are examined and show islands of adenocarcinoma surrounded by fibrous stroma. A panel of immunohistochemical stains was performed to evaluate site of origin. The tumor cells are focally positive for PSA and PSAP and negative for CK7, CK20, TTF-1, CDX-2 and Napsin A. Morphology and immunoprofile are most consistent with a metastatic prostatic adenocarcinoma.    Pathology from CT guided biopsy of R lung mass  Lung adenocarcinoma    Pathology from RLL lobectomy on 05/22/17:  1. Soft tissue, chest wall margin, biopsy:   Fragments of soft tissue involved by adenocarcinoma   2. Lymph node, station 7, excision:    One lymph node, positive for metastatic adenocarcinoma (1/1)   See comment   3. Lung, right lower lobe, lobectomy:   Adenocarcinoma, 6.7 cm, with involvement of parietal pleura   Four hilar lymph nodes, negative for metastatic carcinoma (0/4)     Bone scan 07/13/17:  IMPRESSION: Metastatic disease thoracic and lumbar spine.    CT neck/chest/abd/pelvis 07/13/17:  IMPRESSION:  1. CT of the neck demonstrates no adenopathy. The supraglottic airway is close  related to technique.. There is mild asymmetry in the true vocal cords right is  more prominent than the left and correlation would be helpful.  2. CT of the chest demonstrates right lower lobe lobectomy changes. There is  pleural thickening in the right mid and lower thorax posteriorly most likely  postoperative. There is also slight soft tissue prominence at the postoperative  site in the right hilum again most likely postsurgical but close follow-up would  be helpful.  3. CT of the abdomen and pelvis demonstrates no adenopathy or mass. Slight  nodularity in the prostate gland is stable.  4. Bony metastatic disease to the spine is stable in appearance.    ASSESSMENT  Bruce Clark is a 67 y.o. male with widely metastatic prostate adenocarcinoma and newly diagnosed lung adenocarcinoma status post right lower lobe lobectomy on 05/22/17  and presents today for follow-up.     PLAN    Prostate cancer  Castrate sensitive, treatment na??ve widely metastatic prostate cancer to lymph nodes above and below the diaphragm and bones.  Prostate adenocarcinoma diagnosed on biopsy of the supraclavicular lymph node. Discussed in tumor board and a small cell component has been ruled out.  PSA at the time of diagnosis at 2400.    Currently on Lupron 45 mg every 6 months. On Zytiga 1,018m daily + prednisone 5 mg daily per LATTITUDE.   He has continued to have a radiologic and PSA response  CBC, CMP, PSA monthly in [facility]  Lupron q6 months     R lung adenocarcinoma, T3N2MX stage IIIB- Parietal pleural involvement- PD-L1 99%  S/p RLL lobectomy on 10/5  Very high risk of recurrence  CT neck/chest/abd/pelvis reviewed in tumor board.   Case was discussed in tumor board and the consensus was to offer PORT with N2 disease  Undergoing PORT with Dr. PLeron Croak Last scheduled XRT on 09/17/17    We will continue Zytiga and Prednisone during radiation  Magic mouthwash rx  Will arrange for weekly hydration and labs to support patient during radiation and follow-up every 2 weeks    As regards adjuvant chemotherapy- we elected to not pursue this with another life limiting illness. We decided to consider immunotherapy with any evidence of recurrence.    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 PSA monthly.  Weekly hydration with labs in [facility] on radiation (last day anticipated 09/17/17)    RacJuliene PinaD

## 2017-08-25 NOTE — Progress Notes (Signed)
[facility] Lab Visit : [id number] Pt arrived ambulatory and in no distress, labs drawn per Lab tech. Pt. Voiced no concerns. Departed [facility] ambulatory and in no distress.    Visit Vitals  BP 126/87 (BP 1 Location: Left arm, BP Patient Position: Sitting)   Pulse 85   Temp 98.2 ??F (36.8 ??C)   Resp 20   SpO2 97%       Labs available in CC once resulted.

## 2017-08-25 NOTE — Telephone Encounter (Signed)
Pt friend joyce called and would like a callback regarding getting the patient a new prescription.      [phone]

## 2017-08-25 NOTE — Progress Notes (Signed)
Bruce Clark is a 67 y.o. male here today for follow up, prostate cancer.    1. Have you been to the ER, urgent care clinic since your last visit?  Hospitalized since your last visit? no    2. Have you seen or consulted any other health care providers outside of the [facility] since your last visit?  Include any pap smears or colon screening. No

## 2017-08-26 ENCOUNTER — Encounter

## 2017-08-26 MED ORDER — ABIRATERONE 500 MG TABLET
500 mg | ORAL_TABLET | Freq: Every day | ORAL | 3 refills | Status: DC
Start: 2017-08-26 — End: 2017-10-02

## 2017-08-26 NOTE — Telephone Encounter (Signed)
Call returned to [place].  HIPAA verified.  Patient needs refill on zytiga and can be called to [phone].  To provider for review.

## 2017-08-26 NOTE — Telephone Encounter (Signed)
Refilled.

## 2017-08-27 NOTE — Telephone Encounter (Signed)
Forward information to [employer] - financial assistance for processing.

## 2017-08-27 NOTE — Telephone Encounter (Signed)
Per Anderson Malta, paper work for financial assistance sent to [employer] last week.    Script available.   Will await approval and then fax to delegated pharmacy.

## 2017-09-01 ENCOUNTER — Inpatient Hospital Stay: Admit: 2017-09-01 | Payer: Charity | Primary: Internal Medicine

## 2017-09-01 DIAGNOSIS — C3431 Malignant neoplasm of lower lobe, right bronchus or lung: Secondary | ICD-10-CM

## 2017-09-01 LAB — CBC WITH AUTOMATED DIFF
ABS. BASOPHILS: 0 10*3/uL (ref 0.0–0.1)
ABS. EOSINOPHILS: 0.1 10*3/uL (ref 0.0–0.4)
ABS. IMM. GRANS.: 0 10*3/uL (ref 0.00–0.04)
ABS. LYMPHOCYTES: 0.4 10*3/uL — ABNORMAL LOW (ref 0.8–3.5)
ABS. MONOCYTES: 0.6 10*3/uL (ref 0.0–1.0)
ABS. NEUTROPHILS: 2.2 10*3/uL (ref 1.8–8.0)
ABSOLUTE NRBC: 0 10*3/uL (ref 0.00–0.01)
BASOPHILS: 1 % (ref 0–1)
EOSINOPHILS: 4 % (ref 0–7)
HCT: 33.2 % — ABNORMAL LOW (ref 36.6–50.3)
HGB: 11 g/dL — ABNORMAL LOW (ref 12.1–17.0)
IMMATURE GRANULOCYTES: 0 % (ref 0.0–0.5)
LYMPHOCYTES: 12 % (ref 12–49)
MCH: 29 PG (ref 26.0–34.0)
MCHC: 33.1 g/dL (ref 30.0–36.5)
MCV: 87.6 FL (ref 80.0–99.0)
MONOCYTES: 17 % — ABNORMAL HIGH (ref 5–13)
MPV: 8.4 FL — ABNORMAL LOW (ref 8.9–12.9)
NEUTROPHILS: 66 % (ref 32–75)
NRBC: 0 PER 100 WBC
PLATELET: 188 10*3/uL (ref 150–400)
RBC: 3.79 M/uL — ABNORMAL LOW (ref 4.10–5.70)
RDW: 13.6 % (ref 11.5–14.5)
WBC: 3.3 10*3/uL — ABNORMAL LOW (ref 4.1–11.1)

## 2017-09-01 LAB — METABOLIC PANEL, COMPREHENSIVE
A-G Ratio: 0.8 — ABNORMAL LOW (ref 1.1–2.2)
ALT (SGPT): 9 U/L — ABNORMAL LOW (ref 12–78)
AST (SGOT): 13 U/L — ABNORMAL LOW (ref 15–37)
Albumin: 3.4 g/dL — ABNORMAL LOW (ref 3.5–5.0)
Alk. phosphatase: 76 U/L (ref 45–117)
Anion gap: 5 mmol/L (ref 5–15)
BUN/Creatinine ratio: 19 (ref 12–20)
BUN: 11 MG/DL (ref 6–20)
Bilirubin, total: 0.2 MG/DL (ref 0.2–1.0)
CO2: 28 mmol/L (ref 21–32)
Calcium: 8.9 MG/DL (ref 8.5–10.1)
Chloride: 105 mmol/L (ref 97–108)
Creatinine: 0.59 MG/DL — ABNORMAL LOW (ref 0.70–1.30)
GFR est AA: >60 mL/min/{1.73_m2} (ref >60)
GFR est non-AA: >60 mL/min/{1.73_m2} (ref >60)
Globulin: 4.2 g/dL — ABNORMAL HIGH (ref 2.0–4.0)
Glucose: 93 mg/dL (ref 65–100)
Potassium: 4 mmol/L (ref 3.5–5.1)
Protein, total: 7.6 g/dL (ref 6.4–8.2)
Sodium: 138 mmol/L (ref 136–145)

## 2017-09-01 LAB — PSA, DIAGNOSTIC (PROSTATE SPECIFIC AG): Prostate Specific Ag: 0.1 ng/mL (ref 0.01–4.00)

## 2017-09-01 MED ORDER — SODIUM CHLORIDE 0.9% BOLUS IV
0.9 % | Freq: Once | INTRAVENOUS | Status: AC
Start: 2017-09-01 — End: 2017-09-01
  Administered 2017-09-01: 14:00:00 via INTRAVENOUS

## 2017-09-01 MED FILL — SODIUM CHLORIDE 0.9 % IV: INTRAVENOUS | Qty: 1000

## 2017-09-01 NOTE — Progress Notes (Signed)
Outpatient Infusion Center Progress Note    0825 Pt admit to [facility] for Hydration and labs ambulatory in stable condition. Assessment completed. No new concerns voiced. PIV established in the left arm with positive blood return. Labs drawn and sent for processing    Visit Vitals  BP 118/78 (BP 1 Location: Left arm, BP Patient Position: At rest)   Pulse (!) 109   Temp 97.8 ??F (36.6 ??C)   Resp 18       Medications:  1 liter NS over 1 hour    0940 Pt tolerated treatment well. D/c home ambulatory in no distress. Pt aware of next appointment scheduled for 09/15/17    Recent Results (from the past 12 hour(s))   METABOLIC PANEL, COMPREHENSIVE    Collection Time: 09/01/17  8:33 AM   Result Value Ref Range    Sodium 138 136 - 145 mmol/L    Potassium 4.0 3.5 - 5.1 mmol/L    Chloride 105 97 - 108 mmol/L    CO2 28 21 - 32 mmol/L    Anion gap 5 5 - 15 mmol/L    Glucose 93 65 - 100 mg/dL    BUN 11 6 - 20 MG/DL    Creatinine 0.59 (L) 0.70 - 1.30 MG/DL    BUN/Creatinine ratio 19 12 - 20      GFR est AA >60 >60 ml/min/1.66m    GFR est non-AA >60 >60 ml/min/1.751m   Calcium 8.9 8.5 - 10.1 MG/DL    Bilirubin, total 0.2 0.2 - 1.0 MG/DL    ALT (SGPT) 9 (L) 12 - 78 U/L    AST (SGOT) 13 (L) 15 - 37 U/L    Alk. phosphatase 76 45 - 117 U/L    Protein, total 7.6 6.4 - 8.2 g/dL    Albumin 3.4 (L) 3.5 - 5.0 g/dL    Globulin 4.2 (H) 2.0 - 4.0 g/dL    A-G Ratio 0.8 (L) 1.1 - 2.2     CBC WITH AUTOMATED DIFF    Collection Time: 09/01/17  8:39 AM   Result Value Ref Range    WBC 3.3 (L) 4.1 - 11.1 K/uL    RBC 3.79 (L) 4.10 - 5.70 M/uL    HGB 11.0 (L) 12.1 - 17.0 g/dL    HCT 33.2 (L) 36.6 - 50.3 %    MCV 87.6 80.0 - 99.0 FL    MCH 29.0 26.0 - 34.0 PG    MCHC 33.1 30.0 - 36.5 g/dL    RDW 13.6 11.5 - 14.5 %    PLATELET 188 150 - 400 K/uL    MPV 8.4 (L) 8.9 - 12.9 FL    NRBC 0.0 0 PER 100 WBC    ABSOLUTE NRBC 0.00 0.00 - 0.01 K/uL    NEUTROPHILS 66 32 - 75 %    LYMPHOCYTES 12 12 - 49 %    MONOCYTES 17 (H) 5 - 13 %    EOSINOPHILS 4 0 - 7 %     BASOPHILS 1 0 - 1 %    IMMATURE GRANULOCYTES 0 0.0 - 0.5 %    ABS. NEUTROPHILS 2.2 1.8 - 8.0 K/UL    ABS. LYMPHOCYTES 0.4 (L) 0.8 - 3.5 K/UL    ABS. MONOCYTES 0.6 0.0 - 1.0 K/UL    ABS. EOSINOPHILS 0.1 0.0 - 0.4 K/UL    ABS. BASOPHILS 0.0 0.0 - 0.1 K/UL    ABS. IMM. GRANS. 0.0 0.00 - 0.04 K/UL    DF SMEAR SCANNED  RBC COMMENTS NORMOCYTIC, NORMOCHROMIC

## 2017-09-02 NOTE — Telephone Encounter (Signed)
Per Anderson Malta approved.    Faxed zytiga script to [facility] [phone]

## 2017-09-08 ENCOUNTER — Ambulatory Visit
Admit: 2017-09-08 | Discharge: 2017-09-08 | Payer: PRIVATE HEALTH INSURANCE | Attending: Registered Nurse | Primary: Internal Medicine

## 2017-09-08 ENCOUNTER — Ambulatory Visit
Admit: 2017-09-08 | Discharge: 2017-09-08 | Payer: PRIVATE HEALTH INSURANCE | Attending: Family | Primary: Internal Medicine

## 2017-09-08 DIAGNOSIS — C3431 Malignant neoplasm of lower lobe, right bronchus or lung: Secondary | ICD-10-CM

## 2017-09-08 MED ORDER — MORPHINE 10 MG/5 ML ORAL SOLN
10 mg/5 mL | ORAL | 0 refills | Status: DC | PRN
Start: 2017-09-08 — End: 2018-03-24

## 2017-09-08 NOTE — Progress Notes (Signed)
Follow up for lung cancer (right).    1. Have you been to the ER, urgent care clinic since your last visit?  Hospitalized since your last visit?No    2. Have you seen or consulted any other health care providers outside of the [place] since your last visit?  Include any pap smears or colon screening. No

## 2017-09-08 NOTE — Progress Notes (Signed)
Bruce Clark is a 67 y.o. male here today for follow up, prostate cancer.     Patient reports that he has had difficulty obtaining Zytiga, has been out of medication for one month.     Patient reports continued cough and congestion, discomfort to lung on right side.     1. Have you been to the ER, urgent care clinic since your last visit?  Hospitalized since your last visit? No     2. Have you seen or consulted any other health care providers outside of the [place] since your last visit?  Include any pap smears or colon screening. No

## 2017-09-08 NOTE — Progress Notes (Signed)
Subjective:      Bruce Clark is a 67 y.o. male presents for follow up care .     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 Eating a regular diet without difficulty.   Bowel movements are regular.  The patient is voiding without difficulty.  Pain is not well controlled.  Medication(s) being used: narcotic analgesics including Oxycodone that no longer is controlling his pain. Dr Melony Overly just this morning gave him a Rx for Morphine 10 Mg/66ml for better pain management.    Objective:     Visit Vitals  BP (!) 143/94 (BP 1 Location: Right arm, BP Patient Position: Sitting)   Pulse (!) 105   Resp 18   Ht 5\' 7"  (1.702 m)   Wt 144 lb (65.3 kg)   SpO2 98%   BMI 22.55 kg/m??       Physical Exam:  GENERAL: alert, cooperative, no distress, appears stated age, LUNG: clear to auscultation bilaterally, HEART: regular rate and rhythm, EXTREMITIES:  extremities normal, atraumatic, no cyanosis or edema        Assessment:     Patient Active Problem List   Diagnosis Code   ??? DVT (deep venous thrombosis) (HCC) I82.409   ??? 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 Metastasis to bone of unknown primary (HCC) C79.51, C80.1   ??? Prostate cancer ([place]) C61   ??? Deep venous thrombosis (HCC) I82.409   ??? Financial difficulties Z59.8   ??? Deep vein thrombosis (DVT) of lower extremity (HCC) I82.409   ??? Bone metastasis (HCC) C79.51   ??? 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 Mucositis due to antineoplastic therapy K12.31   ??? 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 Lung cancer [facility]) C34.90       Mr Bruce Clark is doing well, still undergoing XRT with Dr Leron Croak which is to be completed 09/17/17. He continues to take [place] and predinsone daily and Lupon injections every 6 months for Stage IV prostate cancer beging managed by Dr Melony Overly.          Plan/Recommendations/Medical Decision Making:     Per the NCCN guidelines: Bruce Clark will be followed routinely with  a history and physical and imaging (CT Chest) every 6 months for the first 2 years then yearly. I will see him back in 3 months, Defer imaging to Dr Melony Overly and Dr Leron Croak.    We discussed the importance of proper diet and 30 minutes/day of proper exercise.    All questions were personally answered.    Thank you for allowing [place] to participate in the care of your patient.    Tempie Hoist, [employer]  Thoracic Surgery

## 2017-09-08 NOTE — Progress Notes (Signed)
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 male who is on Eliquis for a h/o DVT who presented to the Emergency Department on 04/22/16 with nausea, vomiting, and abdominal pain x 2 months, decreased appetite and weight loss of 10-14 lbs.  CTA revealed bilateral lung nodules and mediastinal/hilar adenopathy. PET revealed supraclavicular adenopathy, retroperitoneal adenopathy, bilateral iliac adenopathy, mediastinal/hilar adenopathy, bony metastatic disease, right lower lobe pulmonary mass, and multiple other smaller masses in bilateral lungs. USG guided bx of supraclavicular LN showed adenocarcinoma of the prostate. He was also diagnosed and treated for CAP. PSA on 05/26/16-2400. Was discussed in tumor board, Napsin A negative and a small cell component ruled out. Initiated Degarelix  05/26/16. MRI spine with expansile C7 lesion, T12 lesion encroaching epidural space, S1-S2 lesion encroaching neural foramina. Started palliative C6-T5 and T11-L5 XRT under Dr. Leron Croak on 06/09/16. Started Zytiga 08/28/16 with evidence of response. Noted to have a  Left lower lobe lung nodule on CT done 12/22/16. Biopsy consistent with Lung adenocarcinoma. He had a mediastinoscopy with LN being negative for metastatic disease. Robotic RLL lobectomy performed on 05/22/17.     He underwent a robotic right lower lobectomy on 05/22/17 with Dr. Glo Herring. Pathology showed adenocarcinoma T3N2 stage IIIB. Case discussed at tumor board and there is no plan for adjuvant chemotherapy at this time. Currently undergoing post op radiation with Dr. Leron Croak.     Has not taken Zytiga for one month due to awaiting for financial assistance. He continues to take Prednisone. Next Lupron shot is scheduled for 09/29/17. Taking Eliquis without abnormal bleeding. He reports  tenderness at his previous surgical sites, especially at night when he is trying to sleep. He denies nausea/vomiting and he is eating and drinking well. He denies mouth sores or difficultly swallowing. He reports a productive cough with clear phlegm. He denies diarrhea/constipation. Denies fevers/chills. Continues to stay active and takes long walks everyday. His last radiation treatment is scheduled for 09/17/17.     Otherwise, complete ROS is per the symptom report form which has been scanned into the media section of the electronic medical record.    Past Medical History:   Diagnosis Date   ??? Lung cancer ([place])    ??? Prostate cancer ([place])    ??? Stroke ([place])     PT STATES, "I HAD A STROKE A LONG TIME AGO"       Past Surgical History:   Procedure Laterality Date   ??? CHEST SURGERY PROCEDURE UNLISTED  04/27/2017    BRONCHOSCOPY/MEDIASTINOSCOPY   ??? HX GI      HERNIA REPAIR   ??? HX ORTHOPAEDIC  1994    ROD IN RIGHT LEG       No Known Allergies    Current Outpatient Medications   Medication Sig Dispense Refill   ??? abiraterone (ZYTIGA) 500 mg tab Take 1,000 mg by mouth daily. 60 Tab 3   ??? polyethylene glycol (MIRALAX) 17 gram packet Take 1 Packet by mouth daily. 30 Packet 2   ??? magic mouthwash solution Magic mouth wash   Maalox  Lidocaine 2% viscous   Diphenhydramine oral solution   Nystatin 100,000 units    Pharmacy to mix equal portions of ingredients to a total volume as indicated in the dispense amount.  Swish and spit 26m (1 tsp) four  times daily as needed for mouth sores 480 mL 3   ??? predniSONE (DELTASONE) 5 mg tablet Take 1 Tab by mouth daily. Restart on Jun 01, 2017 30 Tab 3   ??? oxyCODONE ER (OXYCONTIN) 10 mg ER tablet Take 1 Tab by mouth every twelve (12) hours. Max Daily Amount: 20 mg. 30 Tab 0   ??? senna (SENOKOT) 8.6 mg tablet Take 1 Tab by mouth nightly. Indications: constipation 30 Tab 0   ??? apixaban (ELIQUIS) 5 mg tablet Take 5 mg by mouth two (2) times a day.         Social History     Socioeconomic History    ??? Marital status: SINGLE     Spouse name: Not on file   ??? Number of children: Not on file   ??? Years of education: Not on file   ??? Highest education level: Not on file   Tobacco Use   ??? Smoking status: Former Smoker     Packs/day: 0.50     Years: 47.00     Pack years: 23.50     Last attempt to quit: 06/2016     Years since quitting: 1.2   ??? Smokeless tobacco: Never Used   Substance and Sexual Activity   ??? Alcohol use: No   ??? Drug use: No       Family History   Problem Relation Age of Onset   ??? Cancer Mother         COLON   ??? Cancer Father         BRAIN TUMOR   ??? No Known Problems Sister    ??? Arthritis-osteo Brother    ??? No Known Problems Sister    ??? No Known Problems Sister    ??? No Known Problems Sister    ??? No Known Problems Brother    ??? Anesth Problems Neg Hx      ROS  A review of systems was obtained and is negative except as listed in HPI.  ECOG PS is 1    Physical Examination:   Visit Vitals  BP 121/88 (BP 1 Location: Right arm, BP Patient Position: Sitting)   Pulse 97   Temp 98.4 ??F (36.9 ??C) (Oral)   Resp 20   Ht 5' 7"  (1.702 m)   Wt 144 lb 3.2 oz (65.4 kg)   SpO2 99%   BMI 22.58 kg/m??     General appearance - alert, and in no distress  Mental status - oriented to person, place, and time  Mouth - mucous membranes moist  Neck - supple, no significant adenopathy  Neurological - normal speech, no focal findings or movement disorder noted  Musculoskeletal - no joint tenderness, deformity or swelling  Extremities - peripheral pulses normal, no pedal edema, no clubbing or cyanosis  Skin - robotic incision site and previous chest tube site on right side of chest appear to be healing appropriately    LABS  Lab Results   Component Value Date/Time    WBC 3.3 (L) 09/01/2017 08:39 AM    HGB 11.0 (L) 09/01/2017 08:39 AM    HCT 33.2 (L) 09/01/2017 08:39 AM    PLATELET 188 09/01/2017 08:39 AM    MCV 87.6 09/01/2017 08:39 AM    ABS. NEUTROPHILS 2.2 09/01/2017 08:39 AM     Lab Results   Component Value Date/Time     Sodium 138 09/01/2017 08:33 AM    Potassium 4.0 09/01/2017 08:33 AM    Chloride 105 09/01/2017  08:33 AM    CO2 28 09/01/2017 08:33 AM    Glucose 93 09/01/2017 08:33 AM    BUN 11 09/01/2017 08:33 AM    Creatinine 0.59 (L) 09/01/2017 08:33 AM    GFR est AA >60 09/01/2017 08:33 AM    GFR est non-AA >60 09/01/2017 08:33 AM    Calcium 8.9 09/01/2017 08:33 AM     Lab Results   Component Value Date/Time    AST (SGOT) 13 (L) 09/01/2017 08:33 AM    Alk. phosphatase 76 09/01/2017 08:33 AM    Protein, total 7.6 09/01/2017 08:33 AM    Albumin 3.4 (L) 09/01/2017 08:33 AM    Globulin 4.2 (H) 09/01/2017 08:33 AM    A-G Ratio 0.8 (L) 09/01/2017 08:33 AM     Recent Labs     09/01/17  0839 08/25/17  0959 07/13/17  0753 05/06/17  0923 03/23/17  0729 01/09/17  1032   PSA 0.1 0.1 0.1  --   --  0.1   PSALT  --   --   --  <0.1 0.3  --        IMAGING  PET CT  03/23/17  IMPRESSION: Mass lesion right lower lobe is hypermetabolic and compatible with  the biopsy confirmed the result of adenocarcinoma. There are soft tissue  densities in the anterior abdominal wall bilaterally which are stable compared  to the prior chest abdomen pelvis CT but new compared to the prior PET/CT.  Please correlate with any recent abdominal wall surgery as these may represent  port sites. Otherwise normal tracer distribution. Stable sclerotic osseous  metastatic disease without abnormal activity.    PATHOLOGY  Supraclavicular node   CYTOLOGIC INTERPRETATION:   Adenocarcinoma, consistent with prostate primary   See comment   General Categorization   Positive for malignancy.   Specimen Adequacy   Satisfactory for evaluation.   Comment   Touch preps and a core biopsy are examined and show islands of adenocarcinoma surrounded by fibrous stroma. A panel of immunohistochemical stains was performed to evaluate site of origin. The tumor cells are focally positive for PSA and PSAP and negative for CK7, CK20, TTF-1, CDX-2 and Napsin A. Morphology and immunoprofile are most  consistent with a metastatic prostatic adenocarcinoma.    Pathology from CT guided biopsy of R lung mass  Lung adenocarcinoma    Pathology from RLL lobectomy on 05/22/17:  1. Soft tissue, chest wall margin, biopsy:   Fragments of soft tissue involved by adenocarcinoma   2. Lymph node, station 7, excision:   One lymph node, positive for metastatic adenocarcinoma (1/1)   See comment   3. Lung, right lower lobe, lobectomy:   Adenocarcinoma, 6.7 cm, with involvement of parietal pleura   Four hilar lymph nodes, negative for metastatic carcinoma (0/4)     Bone scan 07/13/17:  IMPRESSION: Metastatic disease thoracic and lumbar spine.    CT neck/chest/abd/pelvis 07/13/17:  IMPRESSION:  1. CT of the neck demonstrates no adenopathy. The supraglottic airway is close  related to technique.. There is mild asymmetry in the true vocal cords right is  more prominent than the left and correlation would be helpful.  2. CT of the chest demonstrates right lower lobe lobectomy changes. There is  pleural thickening in the right mid and lower thorax posteriorly most likely  postoperative. There is also slight soft tissue prominence at the postoperative  site in the right hilum again most likely postsurgical but close follow-up would  be helpful.  3. CT  of the abdomen and pelvis demonstrates no adenopathy or mass. Slight  nodularity in the prostate gland is stable.  4. Bony metastatic disease to the spine is stable in appearance.    ASSESSMENT  Bruce Clark is a 67 y.o. male with widely metastatic prostate adenocarcinoma and newly diagnosed lung adenocarcinoma status post right lower lobe lobectomy on 05/22/17 and presents today for follow-up.     PLAN    Prostate cancer  Castrate sensitive, treatment na??ve widely metastatic prostate cancer to lymph nodes above and below the diaphragm and bones.  Prostate adenocarcinoma diagnosed on biopsy of the supraclavicular lymph node. Discussed in tumor board and a small cell component has been ruled  out.  PSA at the time of diagnosis at 2400.    Currently on Lupron 45 mg every 6 months. On Zytiga 1,0108m daily + prednisone 5 mg daily per [place]. Has been off Zytiga for one month due to awaiting for financial assistance.  He has continued to have a radiologic and PSA response  CBC, CMP, PSA monthly in [facility]  Lupron q6 months    R lung adenocarcinoma, T3N2MX stage IIIB- Parietal pleural involvement- PD-L1 99%  S/p RLL lobectomy on 10/5  Very high risk of recurrence  CT neck/chest/abd/pelvis reviewed in tumor board.   Case was discussed in tumor board and the consensus was to offer PORT with N2 disease  Undergoing PORT with Dr. PLeron Croak Last scheduled XRT on 09/17/17.    We will continue Zytiga and Prednisone during radiation.  Magic mouthwash PRN for mouth sores.  Will arrange for weekly hydration and labs to support patient during radiation and follow-up every 2 weeks.    As regards adjuvant chemotherapy- we elected to not pursue this with another life limiting illness. We decided to consider immunotherapy with any evidence of recurrence.    Bone metastases  Diffuse  Concerning lesions at C7, T12, S1, S2 which threaten the integrity of the spinal column  Completed palliative radiation that was initiated on 06/09/16  Prednisone 553mdaily  Hold off biphosphonates due to poor dental hygiene  Stable appearance on most recent scans    DVT  Diagnosed 04/22/16  Cancer associated  On lifelong Eliquis 42m442mID   Currently taking this without issues    Right sided abdominal pain  Over laparoscopic incision sites  Intermittent and only when patient lays on right side  Tylenol not improving pain   Will try liquid morphine 42mg29mery 4 hours PRN    Will follow-up in 2 weeks with CBC, CMP. PSA monthly.  Weekly hydration with labs in [facility] on radiation (last day anticipated 09/17/17)  Will reach out to financial coordinator regarding Zytiga.     RachJuliene Pina

## 2017-09-08 NOTE — Telephone Encounter (Signed)
Message   Received: Today   Message Contents   Demetrio Lapping, RN      ??      Spoke with [employer]. ??Per our conversation, should have medicaton in 24-48 hrs    Previous Messages

## 2017-09-09 NOTE — Telephone Encounter (Signed)
Patient's caregiver called about Zytiga.  [facility] advises they still have no documentation.  Assured her when it was sent, and confirmations as well.  Told her I would send again for her.  Pt would like to speak with someone about status of patient.  Please call at your convenience

## 2017-09-09 NOTE — Telephone Encounter (Signed)
Call returned to patients caregiver [name] as requested, message left for return call.

## 2017-09-14 NOTE — Telephone Encounter (Signed)
Call to [name], states she had originally contacted office inquiring about Fabio Asa, questions were answered in full by Anderson Malta. no additional questions at this time. Thanked for return call.

## 2017-09-15 ENCOUNTER — Inpatient Hospital Stay: Admit: 2017-09-15 | Payer: Charity | Primary: Internal Medicine

## 2017-09-15 LAB — CBC WITH AUTOMATED DIFF
ABS. BASOPHILS: 0 10*3/uL (ref 0.0–0.1)
ABS. EOSINOPHILS: 0.1 10*3/uL (ref 0.0–0.4)
ABS. IMM. GRANS.: 0 10*3/uL (ref 0.00–0.04)
ABS. LYMPHOCYTES: 0.3 10*3/uL — ABNORMAL LOW (ref 0.8–3.5)
ABS. MONOCYTES: 0.6 10*3/uL (ref 0.0–1.0)
ABS. NEUTROPHILS: 2.5 10*3/uL (ref 1.8–8.0)
ABSOLUTE NRBC: 0 10*3/uL (ref 0.00–0.01)
BASOPHILS: 0 % (ref 0–1)
EOSINOPHILS: 3 % (ref 0–7)
HCT: 37.3 % (ref 36.6–50.3)
HGB: 12.3 g/dL (ref 12.1–17.0)
IMMATURE GRANULOCYTES: 0 % (ref 0.0–0.5)
LYMPHOCYTES: 9 % — ABNORMAL LOW (ref 12–49)
MCH: 29.2 PG (ref 26.0–34.0)
MCHC: 33 g/dL (ref 30.0–36.5)
MCV: 88.6 FL (ref 80.0–99.0)
MONOCYTES: 18 % — ABNORMAL HIGH (ref 5–13)
MPV: 8.9 FL (ref 8.9–12.9)
NEUTROPHILS: 70 % (ref 32–75)
NRBC: 0 PER 100 WBC
PLATELET: 207 10*3/uL (ref 150–400)
RBC: 4.21 M/uL (ref 4.10–5.70)
RDW: 13.7 % (ref 11.5–14.5)
WBC: 3.5 10*3/uL — ABNORMAL LOW (ref 4.1–11.1)

## 2017-09-15 LAB — METABOLIC PANEL, COMPREHENSIVE
A-G Ratio: 0.8 — ABNORMAL LOW (ref 1.1–2.2)
ALT (SGPT): 8 U/L — ABNORMAL LOW (ref 12–78)
AST (SGOT): 13 U/L — ABNORMAL LOW (ref 15–37)
Albumin: 3.5 g/dL (ref 3.5–5.0)
Alk. phosphatase: 76 U/L (ref 45–117)
Anion gap: 6 mmol/L (ref 5–15)
BUN/Creatinine ratio: 17 (ref 12–20)
BUN: 14 MG/DL (ref 6–20)
Bilirubin, total: 0.2 MG/DL (ref 0.2–1.0)
CO2: 25 mmol/L (ref 21–32)
Calcium: 9.6 MG/DL (ref 8.5–10.1)
Chloride: 104 mmol/L (ref 97–108)
Creatinine: 0.83 MG/DL (ref 0.70–1.30)
GFR est AA: >60 mL/min/{1.73_m2} (ref >60)
GFR est non-AA: >60 mL/min/{1.73_m2} (ref >60)
Globulin: 4.4 g/dL — ABNORMAL HIGH (ref 2.0–4.0)
Glucose: 144 mg/dL — ABNORMAL HIGH (ref 65–100)
Potassium: 3.8 mmol/L (ref 3.5–5.1)
Protein, total: 7.9 g/dL (ref 6.4–8.2)
Sodium: 135 mmol/L — ABNORMAL LOW (ref 136–145)

## 2017-09-15 LAB — PSA, DIAGNOSTIC (PROSTATE SPECIFIC AG): Prostate Specific Ag: 0.1 ng/mL (ref 0.01–4.00)

## 2017-09-15 MED ORDER — SODIUM CHLORIDE 0.9 % IJ SYRG
INTRAMUSCULAR | Status: AC | PRN
Start: 2017-09-15 — End: 2017-09-16
  Administered 2017-09-15: 14:00:00 via INTRAVENOUS

## 2017-09-15 MED ORDER — SODIUM CHLORIDE 0.9% BOLUS IV
0.9 % | Freq: Once | INTRAVENOUS | Status: AC
Start: 2017-09-15 — End: 2017-09-15
  Administered 2017-09-15: 14:00:00 via INTRAVENOUS

## 2017-09-15 MED FILL — SODIUM CHLORIDE 0.9 % IV: INTRAVENOUS | Qty: 1000

## 2017-09-15 NOTE — Progress Notes (Signed)
Outpatient Infusion Center Progress Note    0900 Pt admit to [facility] for hydration ambulatory in stable condition. Assessment completed. No new concerns voiced. Peripheral IV accessed in left AC with positive blood return. Labs drawn and sent for processing. PIV flushed and NS bolus started as ordered.     Visit Vitals  BP 99/64 (BP 1 Location: Right arm, BP Patient Position: Sitting)   Pulse 88   Temp 98 ??F (36.7 ??C)   Resp 18   SpO2 98%       Medications:  1 L NS over 1 hour  NS flushes    1015 Pt tolerated treatment well. PIV maintained positive blood return throughout treatment. PIV flushed and deaccessed per protocol. D/c home ambulatory in no distress. Pt aware of next appointment scheduled for 09/29/17. Some labs still pending at time of note. Please see [facility] for full results.     Recent Results (from the past 12 hour(s))   METABOLIC PANEL, COMPREHENSIVE    Collection Time: 09/15/17  9:09 AM   Result Value Ref Range    Sodium 135 (L) 136 - 145 mmol/L    Potassium 3.8 3.5 - 5.1 mmol/L    Chloride 104 97 - 108 mmol/L    CO2 25 21 - 32 mmol/L    Anion gap 6 5 - 15 mmol/L    Glucose 144 (H) 65 - 100 mg/dL    BUN 14 6 - 20 MG/DL    Creatinine 0.83 0.70 - 1.30 MG/DL    BUN/Creatinine ratio 17 12 - 20      GFR est AA >60 >60 ml/min/1.49m    GFR est non-AA >60 >60 ml/min/1.773m   Calcium 9.6 8.5 - 10.1 MG/DL    Bilirubin, total 0.2 0.2 - 1.0 MG/DL    ALT (SGPT) 8 (L) 12 - 78 U/L    AST (SGOT) 13 (L) 15 - 37 U/L    Alk. phosphatase 76 45 - 117 U/L    Protein, total 7.9 6.4 - 8.2 g/dL    Albumin 3.5 3.5 - 5.0 g/dL    Globulin 4.4 (H) 2.0 - 4.0 g/dL    A-G Ratio 0.8 (L) 1.1 - 2.2     CBC WITH AUTOMATED DIFF    Collection Time: 09/15/17  9:09 AM   Result Value Ref Range    WBC 3.5 (L) 4.1 - 11.1 K/uL    RBC 4.21 4.10 - 5.70 M/uL    HGB 12.3 12.1 - 17.0 g/dL    HCT 37.3 36.6 - 50.3 %    MCV 88.6 80.0 - 99.0 FL    MCH 29.2 26.0 - 34.0 PG    MCHC 33.0 30.0 - 36.5 g/dL    RDW 13.7 11.5 - 14.5 %     PLATELET 207 150 - 400 K/uL    MPV 8.9 8.9 - 12.9 FL    NRBC 0.0 0 PER 100 WBC    ABSOLUTE NRBC 0.00 0.00 - 0.01 K/uL    NEUTROPHILS PENDING %    LYMPHOCYTES PENDING %    MONOCYTES PENDING %    EOSINOPHILS PENDING %    BASOPHILS PENDING %    IMMATURE GRANULOCYTES PENDING %    ABS. NEUTROPHILS PENDING K/UL    ABS. LYMPHOCYTES PENDING K/UL    ABS. MONOCYTES PENDING K/UL    ABS. EOSINOPHILS PENDING K/UL    ABS. BASOPHILS PENDING K/UL    ABS. IMM. GRANS. PENDING K/UL    DF PENDING

## 2017-09-22 ENCOUNTER — Encounter: Attending: Registered Nurse | Primary: Internal Medicine

## 2017-09-22 NOTE — Progress Notes (Signed)
Patient no show to appointment today. Please call and check on him and ask if he was able to get Zytiga. He can come next week 2/12 with infusion appointment.

## 2017-09-29 ENCOUNTER — Inpatient Hospital Stay: Payer: MEDICAID | Primary: Internal Medicine

## 2017-09-29 ENCOUNTER — Encounter: Attending: Registered Nurse | Primary: Internal Medicine

## 2017-09-29 DIAGNOSIS — C61 Malignant neoplasm of prostate: Secondary | ICD-10-CM

## 2017-09-30 ENCOUNTER — Encounter

## 2017-09-30 MED ORDER — APIXABAN 5 MG TABLET
5 mg | ORAL_TABLET | Freq: Two times a day (BID) | ORAL | 3 refills | Status: DC
Start: 2017-09-30 — End: 2017-10-02

## 2017-09-30 NOTE — Telephone Encounter (Signed)
Refills available on zytiga.  Eliquis to pharmacy on file
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
## 2017-10-02 ENCOUNTER — Encounter

## 2017-10-02 MED ORDER — APIXABAN 5 MG TABLET
5 mg | ORAL_TABLET | Freq: Two times a day (BID) | ORAL | 0 refills | Status: DC
Start: 2017-10-02 — End: 2017-10-06

## 2017-10-02 MED ORDER — APIXABAN 5 MG TABLET
5 mg | ORAL_TABLET | Freq: Two times a day (BID) | ORAL | 3 refills | Status: DC
Start: 2017-10-02 — End: 2017-10-02

## 2017-10-02 MED ORDER — ABIRATERONE 500 MG TABLET
500 mg | ORAL_TABLET | Freq: Every day | ORAL | 3 refills | Status: DC
Start: 2017-10-02 — End: 2018-09-17
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
## 2017-10-02 NOTE — Telephone Encounter (Signed)
[name] called on patients behalf to speak to sue.    CB# [phone]

## 2017-10-02 NOTE — Telephone Encounter (Signed)
[name] would like a callback from sue regarding the patient.    [phone]

## 2017-10-06 ENCOUNTER — Ambulatory Visit
Admit: 2017-10-06 | Discharge: 2017-10-06 | Payer: PRIVATE HEALTH INSURANCE | Attending: Registered Nurse | Primary: Internal Medicine

## 2017-10-06 ENCOUNTER — Inpatient Hospital Stay: Admit: 2017-10-06 | Discharge: 2017-10-06 | Payer: MEDICAID | Primary: Internal Medicine

## 2017-10-06 DIAGNOSIS — C3431 Malignant neoplasm of lower lobe, right bronchus or lung: Secondary | ICD-10-CM

## 2017-10-06 LAB — CBC WITH AUTOMATED DIFF
ABS. BASOPHILS: 0 10*3/uL (ref 0.0–0.1)
ABS. EOSINOPHILS: 0.1 10*3/uL (ref 0.0–0.4)
ABS. IMM. GRANS.: 0 10*3/uL (ref 0.00–0.04)
ABS. LYMPHOCYTES: 0.2 10*3/uL — ABNORMAL LOW (ref 0.8–3.5)
ABS. MONOCYTES: 0.5 10*3/uL (ref 0.0–1.0)
ABS. NEUTROPHILS: 3.1 10*3/uL (ref 1.8–8.0)
ABSOLUTE NRBC: 0 10*3/uL (ref 0.00–0.01)
BASOPHILS: 0 % (ref 0–1)
EOSINOPHILS: 3 % (ref 0–7)
HCT: 24.6 % — ABNORMAL LOW (ref 36.6–50.3)
HGB: 8.4 g/dL — ABNORMAL LOW (ref 12.1–17.0)
IMMATURE GRANULOCYTES: 0 % (ref 0.0–0.5)
LYMPHOCYTES: 6 % — ABNORMAL LOW (ref 12–49)
MCH: 29.1 PG (ref 26.0–34.0)
MCHC: 34.1 g/dL (ref 30.0–36.5)
MCV: 85.1 FL (ref 80.0–99.0)
MONOCYTES: 14 % — ABNORMAL HIGH (ref 5–13)
MPV: 10.9 FL (ref 8.9–12.9)
NEUTROPHILS: 77 % — ABNORMAL HIGH (ref 32–75)
NRBC: 0 PER 100 WBC
PLATELET: 235 10*3/uL (ref 150–400)
RBC: 2.89 M/uL — ABNORMAL LOW (ref 4.10–5.70)
RDW: 12.8 % (ref 11.5–14.5)
WBC: 3.9 10*3/uL — ABNORMAL LOW (ref 4.1–11.1)

## 2017-10-06 LAB — METABOLIC PANEL, COMPREHENSIVE
A-G Ratio: 0.4 — ABNORMAL LOW (ref 1.1–2.2)
Albumin: 2 g/dL — ABNORMAL LOW (ref 3.5–5.0)
Alk. phosphatase: 58 U/L (ref 45–117)
Anion gap: 7 mmol/L (ref 5–15)
BUN/Creatinine ratio: 15 (ref 12–20)
BUN: 7 MG/DL (ref 6–20)
Bilirubin, total: 0.5 MG/DL (ref 0.2–1.0)
CO2: 22 mmol/L (ref 21–32)
Calcium: 7.5 MG/DL — ABNORMAL LOW (ref 8.5–10.1)
Chloride: 108 mmol/L (ref 97–108)
Creatinine: 0.48 MG/DL — ABNORMAL LOW (ref 0.70–1.30)
GFR est AA: >60 mL/min/{1.73_m2} (ref >60)
GFR est non-AA: >60 mL/min/{1.73_m2} (ref >60)
Globulin: 4.8 g/dL — ABNORMAL HIGH (ref 2.0–4.0)
Glucose: 96 mg/dL (ref 65–100)
Protein, total: 6.8 g/dL (ref 6.4–8.2)
Sodium: 137 mmol/L (ref 136–145)

## 2017-10-06 LAB — PSA, DIAGNOSTIC (PROSTATE SPECIFIC AG): Prostate Specific Ag: 0.1 ng/mL (ref 0.01–4.00)

## 2017-10-06 MED ORDER — APIXABAN 5 MG TABLET
5 mg | ORAL_TABLET | Freq: Two times a day (BID) | ORAL | 0 refills | Status: DC
Start: 2017-10-06 — End: 2018-06-23

## 2017-10-06 MED ORDER — SODIUM CHLORIDE 0.9% BOLUS IV
0.9 % | Freq: Once | INTRAVENOUS | Status: AC
Start: 2017-10-06 — End: 2017-10-06
  Administered 2017-10-06: 16:00:00 via INTRAVENOUS

## 2017-10-06 MED ORDER — AMOXICILLIN CLAVULANATE 875 MG-125 MG TAB
875-125 mg | ORAL_TABLET | Freq: Two times a day (BID) | ORAL | 0 refills | Status: DC
Start: 2017-10-06 — End: 2017-10-21

## 2017-10-06 MED FILL — SODIUM CHLORIDE 0.9 % IV: INTRAVENOUS | Qty: 1000

## 2017-10-06 NOTE — Telephone Encounter (Signed)
Call to [facility]  HIPAA verified.    Bruce Clark believes his zytiga and eliquis will be delivered today.  That is not the case per [name].  He is still without medications.  She also states that his pain in not controlled and it is bothering him.  Weak.    She also states he has given up and has gotten "mean" on occasion.   Advised we assess patient and advise recommendation.    Labs and hydration in peds [facility].    Requesting 7 day ELIQUIS supply through [employer].  [employer]/SW to handle.

## 2017-10-06 NOTE — Progress Notes (Signed)
PEDI OPIC BREMO VISIT NOTE  ????  1015??Patient arrives for Hydration/labs??without acute problems. Please see connect care for complete assessment and education provided.   ??  24 gauge PIV placed to right ac with difficulty; patient severely dehydrated and multiple sticks attempted; + blood return noted.  ????  Vitals Signs:  Patient Vitals for the past 12 hrs:   Temp Pulse Resp BP   10/06/17 1022 97.5 ??F (36.4 ??C) 85 20 103/72       Labs remain in processing at time of this note; results can be found in connect care.  ??  Medications:  Verified by Clayton RN via Micromedex  1. NS 1liter over 1 hour  ??  Patient's PIV was flushed; removed and bandage placed over site.    1245 Vital signs stable throughout and prior to discharge, Patient tolerated treatment well and discharged without incident. Patient/parent is aware of next [facility] appointment on 10/27/2017 @ 9:30am
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
## 2017-10-06 NOTE — Progress Notes (Signed)
Problem: Knowledge Deficit  Goal: *Verbalizes understanding of procedures and medications  Outcome: Progressing Towards Goal  Patient here for Hydration

## 2017-10-06 NOTE — Progress Notes (Signed)
Bruce Clark is a 67 y.o. male here today for follow up, lung cancer.       Patient reports discomfort to right side 8/10, has not been able to eat in the last 5 days. Cough and congestion.  Very weak. No bowel movement in 4 days.    1. Have you been to the ER, urgent care clinic since your last visit?  Hospitalized since your last visit? No     2. Have you seen or consulted any other health care providers outside of the [place] since your last visit?  Include any pap smears or colon screening. No
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
## 2017-10-09 ENCOUNTER — Inpatient Hospital Stay: Admit: 2017-10-09 | Payer: MEDICAID | Primary: Internal Medicine

## 2017-10-09 LAB — IRON PROFILE
Iron % saturation: 18 % — ABNORMAL LOW (ref 20–50)
Iron: 24 ug/dL — ABNORMAL LOW (ref 35–150)
TIBC: 131 ug/dL — ABNORMAL LOW (ref 250–450)

## 2017-10-09 LAB — FERRITIN: Ferritin: 1162 NG/ML — ABNORMAL HIGH (ref 26–388)

## 2017-10-09 LAB — VITAMIN B12: Vitamin B12: 371 pg/mL (ref 193–986)

## 2017-10-09 MED ORDER — SODIUM CHLORIDE 0.9% BOLUS IV
0.9 % | Freq: Once | INTRAVENOUS | Status: AC
Start: 2017-10-09 — End: 2017-10-09
  Administered 2017-10-09: 14:00:00 via INTRAVENOUS

## 2017-10-09 MED FILL — SODIUM CHLORIDE 0.9 % IV: INTRAVENOUS | Qty: 1000

## 2017-10-09 NOTE — Progress Notes (Signed)
Outpatient Infusion Center Progress Note    0815 Pt admit to [facility] for hydration ambulatory in stable condition. Assessment completed. No new concerns voiced. Peripheral IV accessed in right arm with positive blood return. Labs drawn and sent for processing. PIV flushed and NS bolus started as ordered.     Visit Vitals  BP 102/70   Pulse 81   Temp 99 ??F (37.2 ??C)   Resp 18       Medications:  1 L NS over 1 hour       0930 Pt tolerated treatment well. PIV maintained positive blood return throughout treatment. PIV flushed and deaccessed per protocol. D/c home ambulatory in no distress. Pt aware of next appointment scheduled for 10/16/17. Some labs still pending at time of note. Please see Connect Care for full results.

## 2017-10-09 NOTE — Telephone Encounter (Signed)
[facility]   [address] [place] [address]   W: [phone]  F: [facility]      Reason for nutrition visit: Supportive visit with patient to introduce self and discussed role of oncology dietitian in providing supportive nutrition care. Explained that RD is available to be a resource for managing symptoms, minimizing weight changes and maintaining optimal nutrition status during and after cancer treatment.   He reports his appetite is slowly improving, last night he had a ham sandwich, grilled cheese and ice cream.  He has been drinking Boost shakes too.  Denies nausea/vomiting.      Results:   Diagnosis: Lung cancer, completed radiation on 09/18/17.    Wt Readings from Last 5 Encounters:   10/06/17 139 lb 6.4 oz (63.2 kg)   09/08/17 144 lb (65.3 kg)   09/08/17 144 lb 3.2 oz (65.4 kg)   08/25/17 144 lb 12.8 oz (65.7 kg)   07/29/17 147 lb (66.7 kg)     Estimated Nutrition Needs:   Calorie Range: 1890-2205kcal/day  Protein Range: 63-73g/day     Fluid Needs: 2082ml      Assessment:   Involuntary weight loss related to appetite loss as evidence by weight loss of 5# x 1 month.      Plan:   ??  Discussed consuming 5-6 small meals instead of 3 large meals.    ?? Increase intake of nutrient-dense foods   ?? Drink nutrition supplements - samples and coupons provided.  ?? Take advantage of times when feeling good.   ?? Eat meals and snacks in a pleasant atmosphere.   ?? Keep snacks readily available and in eye sight to promote/stimulate appetite.    ?? Eat an extra bedtime snack.  This will give you extra calories but won't affect your appetite for the next meals.        I appreciate the opportunity to participate in Mr. GEROGE GILLIAM care.    Signed By: Drinda Butts, [place], Anon Raices, [place]     Contact: [phone]

## 2017-10-10 ENCOUNTER — Inpatient Hospital Stay: Admit: 2017-10-10 | Payer: MEDICAID | Attending: Registered Nurse | Primary: Internal Medicine

## 2017-10-10 DIAGNOSIS — C3431 Malignant neoplasm of lower lobe, right bronchus or lung: Secondary | ICD-10-CM

## 2017-10-10 MED ORDER — IOHEXOL 240 MG/ML IV SOLN
240 mg iodine/mL | Freq: Once | INTRAVENOUS | Status: AC
Start: 2017-10-10 — End: 2017-10-10
  Administered 2017-10-10: 17:00:00 via ORAL

## 2017-10-10 MED ORDER — SODIUM CHLORIDE 0.9 % IJ SYRG
Freq: Once | INTRAMUSCULAR | Status: AC
Start: 2017-10-10 — End: 2017-10-10
  Administered 2017-10-10: 17:00:00 via INTRAVENOUS

## 2017-10-10 MED ORDER — SODIUM CHLORIDE 0.9% BOLUS IV
0.9 % | Freq: Once | INTRAVENOUS | Status: AC
Start: 2017-10-10 — End: 2017-10-10
  Administered 2017-10-10: 17:00:00 via INTRAVENOUS

## 2017-10-10 MED ORDER — IOPAMIDOL 76 % IV SOLN
370 mg iodine /mL (76 %) | Freq: Once | INTRAVENOUS | Status: AC
Start: 2017-10-10 — End: 2017-10-10
  Administered 2017-10-10: 17:00:00 via INTRAVENOUS

## 2017-10-10 MED FILL — OMNIPAQUE 240 MG IODINE/ML INTRAVENOUS SOLUTION: 240 mg iodine/mL | INTRAVENOUS | Qty: 50

## 2017-10-16 ENCOUNTER — Inpatient Hospital Stay: Admit: 2017-10-16 | Payer: MEDICAID | Primary: Internal Medicine

## 2017-10-16 DIAGNOSIS — C3431 Malignant neoplasm of lower lobe, right bronchus or lung: Secondary | ICD-10-CM

## 2017-10-16 DIAGNOSIS — C61 Malignant neoplasm of prostate: Secondary | ICD-10-CM

## 2017-10-16 LAB — METABOLIC PANEL, COMPREHENSIVE
A-G Ratio: 0.5 — ABNORMAL LOW (ref 1.1–2.2)
ALT (SGPT): 9 U/L — ABNORMAL LOW (ref 12–78)
AST (SGOT): 12 U/L — ABNORMAL LOW (ref 15–37)
Albumin: 2.9 g/dL — ABNORMAL LOW (ref 3.5–5.0)
Alk. phosphatase: 74 U/L (ref 45–117)
Anion gap: 8 mmol/L (ref 5–15)
BUN/Creatinine ratio: 15 (ref 12–20)
BUN: 10 MG/DL (ref 6–20)
Bilirubin, total: 0.3 MG/DL (ref 0.2–1.0)
CO2: 25 mmol/L (ref 21–32)
Calcium: 9.8 MG/DL (ref 8.5–10.1)
Chloride: 102 mmol/L (ref 97–108)
Creatinine: 0.68 MG/DL — ABNORMAL LOW (ref 0.70–1.30)
GFR est AA: >60 mL/min/{1.73_m2} (ref >60)
GFR est non-AA: >60 mL/min/{1.73_m2} (ref >60)
Globulin: 5.4 g/dL — ABNORMAL HIGH (ref 2.0–4.0)
Glucose: 88 mg/dL (ref 65–100)
Potassium: 4.1 mmol/L (ref 3.5–5.1)
Protein, total: 8.3 g/dL — ABNORMAL HIGH (ref 6.4–8.2)
Sodium: 135 mmol/L — ABNORMAL LOW (ref 136–145)

## 2017-10-16 LAB — CBC WITH AUTOMATED DIFF
ABS. BASOPHILS: 0 10*3/uL (ref 0.0–0.1)
ABS. EOSINOPHILS: 0.1 10*3/uL (ref 0.0–0.4)
ABS. IMM. GRANS.: 0 10*3/uL (ref 0.00–0.04)
ABS. LYMPHOCYTES: 0.5 10*3/uL — ABNORMAL LOW (ref 0.8–3.5)
ABS. MONOCYTES: 0.7 10*3/uL (ref 0.0–1.0)
ABS. NEUTROPHILS: 3 10*3/uL (ref 1.8–8.0)
ABSOLUTE NRBC: 0 10*3/uL (ref 0.00–0.01)
BASOPHILS: 0 % (ref 0–1)
EOSINOPHILS: 3 % (ref 0–7)
HCT: 31 % — ABNORMAL LOW (ref 36.6–50.3)
HGB: 9.9 g/dL — ABNORMAL LOW (ref 12.1–17.0)
IMMATURE GRANULOCYTES: 0 % (ref 0.0–0.5)
LYMPHOCYTES: 12 % (ref 12–49)
MCH: 28 PG (ref 26.0–34.0)
MCHC: 31.9 g/dL (ref 30.0–36.5)
MCV: 87.6 FL (ref 80.0–99.0)
MONOCYTES: 16 % — ABNORMAL HIGH (ref 5–13)
MPV: 8.5 FL — ABNORMAL LOW (ref 8.9–12.9)
NEUTROPHILS: 69 % (ref 32–75)
NRBC: 0 PER 100 WBC
PLATELET: 297 10*3/uL (ref 150–400)
RBC: 3.54 M/uL — ABNORMAL LOW (ref 4.10–5.70)
RDW: 13 % (ref 11.5–14.5)
WBC: 4.3 10*3/uL (ref 4.1–11.1)

## 2017-10-16 LAB — PSA, DIAGNOSTIC (PROSTATE SPECIFIC AG): Prostate Specific Ag: 0.1 ng/mL (ref 0.01–4.00)

## 2017-10-16 MED ORDER — SODIUM CHLORIDE 0.9% BOLUS IV
0.9 % | Freq: Once | INTRAVENOUS | Status: AC
Start: 2017-10-16 — End: 2017-10-16
  Administered 2017-10-16: 13:00:00 via INTRAVENOUS

## 2017-10-16 MED FILL — SODIUM CHLORIDE 0.9 % IV: INTRAVENOUS | Qty: 1000

## 2017-10-16 NOTE — Progress Notes (Signed)
0800 Pt admit to [facility] for Hydration ambulatory in stable condition. Assessment completed. No new concerns voiced. Peripheral IV with positive blood return. Labs drawn per order and sent for processing.     Visit Vitals  BP 112/70   Pulse 88   Temp 97.8 ??F (36.6 ??C)   Resp 18       Medications:  Normal Saline 1068m    1230 Pt tolerated treatment well. Peripheral IV maintained positive blood return throughout treatment, flushed with positive blood return and removed at conclusion. D/c home ambulatory in no distress. Pt aware of next [facility] appointment scheduled for 10/23/17.    Recent Results (from the past 12 hour(s))   CBC WITH AUTOMATED DIFF    Collection Time: 10/16/17  7:59 AM   Result Value Ref Range    WBC 4.3 4.1 - 11.1 K/uL    RBC 3.54 (L) 4.10 - 5.70 M/uL    HGB 9.9 (L) 12.1 - 17.0 g/dL    HCT 31.0 (L) 36.6 - 50.3 %    MCV 87.6 80.0 - 99.0 FL    MCH 28.0 26.0 - 34.0 PG    MCHC 31.9 30.0 - 36.5 g/dL    RDW 13.0 11.5 - 14.5 %    PLATELET 297 150 - 400 K/uL    MPV 8.5 (L) 8.9 - 12.9 FL    NRBC 0.0 0 PER 100 WBC    ABSOLUTE NRBC 0.00 0.00 - 0.01 K/uL    NEUTROPHILS 69 32 - 75 %    LYMPHOCYTES 12 12 - 49 %    MONOCYTES 16 (H) 5 - 13 %    EOSINOPHILS 3 0 - 7 %    BASOPHILS 0 0 - 1 %    IMMATURE GRANULOCYTES 0 0.0 - 0.5 %    ABS. NEUTROPHILS 3.0 1.8 - 8.0 K/UL    ABS. LYMPHOCYTES 0.5 (L) 0.8 - 3.5 K/UL    ABS. MONOCYTES 0.7 0.0 - 1.0 K/UL    ABS. EOSINOPHILS 0.1 0.0 - 0.4 K/UL    ABS. BASOPHILS 0.0 0.0 - 0.1 K/UL    ABS. IMM. GRANS. 0.0 0.00 - 0.04 K/UL    DF SMEAR SCANNED      RBC COMMENTS NORMOCYTIC, NORMOCHROMIC     METABOLIC PANEL, COMPREHENSIVE    Collection Time: 10/16/17  7:59 AM   Result Value Ref Range    Sodium 135 (L) 136 - 145 mmol/L    Potassium 4.1 3.5 - 5.1 mmol/L    Chloride 102 97 - 108 mmol/L    CO2 25 21 - 32 mmol/L    Anion gap 8 5 - 15 mmol/L    Glucose 88 65 - 100 mg/dL    BUN 10 6 - 20 MG/DL    Creatinine 0.68 (L) 0.70 - 1.30 MG/DL    BUN/Creatinine ratio 15 12 - 20       GFR est AA >60 >60 ml/min/1.771m   GFR est non-AA >60 >60 ml/min/1.7345m  Calcium 9.8 8.5 - 10.1 MG/DL    Bilirubin, total 0.3 0.2 - 1.0 MG/DL    ALT (SGPT) 9 (L) 12 - 78 U/L    AST (SGOT) 12 (L) 15 - 37 U/L    Alk. phosphatase 74 45 - 117 U/L    Protein, total 8.3 (H) 6.4 - 8.2 g/dL    Albumin 2.9 (L) 3.5 - 5.0 g/dL    Globulin 5.4 (H) 2.0 - 4.0 g/dL    A-G Ratio 0.5 (L) 1.1 -  2.2     PSA, DIAGNOSTIC (PROSTATE SPECIFIC AG)    Collection Time: 10/16/17  7:59 AM   Result Value Ref Range    Prostate Specific Ag 0.1 0.01 - 4.00 ng/mL

## 2017-10-21 ENCOUNTER — Ambulatory Visit
Admit: 2017-10-21 | Discharge: 2017-10-21 | Payer: PRIVATE HEALTH INSURANCE | Attending: Internal Medicine | Primary: Internal Medicine

## 2017-10-21 DIAGNOSIS — C3431 Malignant neoplasm of lower lobe, right bronchus or lung: Secondary | ICD-10-CM

## 2017-10-21 NOTE — Progress Notes (Signed)
Bruce Clark is a 67 y.o. male here today for follow up, prostate cancer.     1. Have you been to the ER, urgent care clinic since your last visit?  Hospitalized since your last visit?no     2. Have you seen or consulted any other health care providers outside of the [facility] since your last visit?  Include any pap smears or colon screening. No     Was supposed to receive Zytiga on Monday, has not yet received in the mail.

## 2017-10-21 NOTE — Progress Notes (Signed)
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 is Clark 67 y.o. male who is on Eliquis for Clark h/o DVT who presented to the Emergency Department on 04/22/16 with nausea, vomiting, and abdominal pain x 2 months, decreased appetite and weight loss of 10-14 lbs.  CTA revealed bilateral lung nodules and mediastinal/hilar adenopathy. PET revealed supraclavicular adenopathy, retroperitoneal adenopathy, bilateral iliac adenopathy, mediastinal/hilar adenopathy, bony metastatic disease, right lower lobe pulmonary mass, and multiple other smaller masses in bilateral lungs. USG guided bx of supraclavicular LN showed adenocarcinoma of the prostate. He was also diagnosed and treated for CAP. PSA on 05/26/16-2400. Was discussed in tumor board, Bruce Clark negative and Clark small cell component ruled out. Initiated Degarelix  05/26/16. MRI spine with expansile C7 lesion, T12 lesion encroaching epidural space, S1-S2 lesion encroaching neural foramina. Started palliative C6-T5 and T11-L5 XRT under Dr. Leron Clark on 06/09/16. Started Zytiga 08/28/16 with evidence of response. Noted to have Clark  Left lower lobe lung nodule on CT done 12/22/16. Biopsy consistent with Lung adenocarcinoma. He had Clark mediastinoscopy with LN being negative for metastatic disease. Robotic RLL lobectomy performed on 05/22/17.     He underwent Clark robotic right lower lobectomy on 05/22/17 with Dr. Glo Clark. Pathology showed adenocarcinoma T3N2 stage IIIB. Case discussed at tumor board and there is no plan for adjuvant chemotherapy at this time. He completed post op radiation with Dr. Leron Clark on 09/18/17.     Comes in today for follow-up and scan review. He feels much better then he did last visit. He is eating and drinking more and is back to walking. He still has not received his Bruce Clark but is scheduled for delivery tomorrow.  He continues to take Prednisone and Eliquis. Next Lupron shot is scheduled for 12/28/17. He denies nausea/vomiting or constipation/diarrhea. His right sided abdominal pain continues and he is taking liquid morphine 54m twice daily. States it helps the pain but wears off quickly. He is resistant to taking this more than twice daily as he has fear of getting addicted. States it is "well controlled" twice daily. He denies SOB, CP, or fevers/chills. He has an intermittent dry cough. He has an intermittent headache. He denies dizziness, vision changes, or recent falls.     Otherwise, complete ROS is per the symptom report form which has been scanned into the media section of the electronic medical record.    Past Medical History:   Diagnosis Date   ??? Lung cancer ([facility]    ??? Prostate cancer ([facility]    ??? Stroke ([facility]     PT STATES, "I HAD Clark STROKE Clark LONG TIME AGO"       Past Surgical History:   Procedure Laterality Date   ??? CHEST SURGERY PROCEDURE UNLISTED  04/27/2017    BRONCHOSCOPY/MEDIASTINOSCOPY   ??? HX GI      HERNIA REPAIR   ??? HX ORTHOPAEDIC  1994    ROD IN RIGHT LEG       No Known Allergies    Current Outpatient Medications   Medication Sig Dispense Refill   ??? apixaban (ELIQUIS) 5 mg tablet Take 1 Tab by mouth two (2) times Clark day. 14 Tab 0   ??? morphine 10 mg/5 mL oral solution Take 2.5 mL by mouth every four (4) hours as needed for Pain. Max Daily Amount: 30 mg.  500 mL 0   ??? polyethylene glycol (MIRALAX) 17 gram packet Take 1 Packet by mouth daily. 30 Packet 2   ??? magic mouthwash solution Magic mouth wash   Maalox  Lidocaine 2% viscous   Diphenhydramine oral solution   Nystatin 100,000 units    Pharmacy to mix equal portions of ingredients to Clark total volume as indicated in the dispense amount.  Swish and spit 41m (1 tsp) four times daily as needed for mouth sores 480 mL 3   ??? predniSONE (DELTASONE) 5 mg tablet Take 1 Tab by mouth daily. Restart on Jun 01, 2017 30 Tab 3    ??? senna (SENOKOT) 8.6 mg tablet Take 1 Tab by mouth nightly. Indications: constipation 30 Tab 0   ??? abiraterone (ZYTIGA) 500 mg tab Take 1,000 mg by mouth daily. 60 Tab 3       Social History     Socioeconomic History   ??? Marital status: SINGLE     Spouse name: Not on file   ??? Number of children: Not on file   ??? Years of education: Not on file   ??? Highest education level: Not on file   Tobacco Use   ??? Smoking status: Former Smoker     Packs/day: 0.50     Years: 47.00     Pack years: 23.50     Last attempt to quit: 06/2016     Years since quitting: 1.3   ??? Smokeless tobacco: Never Used   Substance and Sexual Activity   ??? Alcohol use: No   ??? Drug use: No       Family History   Problem Relation Age of Onset   ??? Cancer Mother         COLON   ??? Cancer Father         BRAIN TUMOR   ??? No Known Problems Sister    ??? Arthritis-osteo Brother    ??? No Known Problems Sister    ??? No Known Problems Sister    ??? No Known Problems Sister    ??? No Known Problems Brother    ??? Anesth Problems Neg Hx      ROS  Clark review of systems was obtained and is negative except as listed in HPI.  ECOG PS is 1    Physical Examination:   Visit Vitals  BP 103/69 (BP 1 Location: Right arm, BP Patient Position: Sitting)   Pulse 94   Temp 98.8 ??F (37.1 ??C) (Oral)   Resp 16   Ht 5' 7"  (1.702 m)   Wt 135 lb (61.2 kg)   SpO2 97%   BMI 21.14 kg/m??     General appearance - alert, and in no distress  Mental status - oriented to person, place, and time  Mouth - mucous membranes moist, no lesions noted  Neck - supple, no significant adenopathy  Neurological - normal speech, no focal findings or movement disorder noted  Musculoskeletal - no joint tenderness, deformity or swelling  Extremities - peripheral pulses normal, no pedal edema, no clubbing or cyanosis  Skin - robotic incision site and previous chest tube site on right side of chest appear to be healing appropriately, TTP    LABS  Lab Results   Component Value Date/Time    WBC 4.3 10/16/2017 07:59 AM     HGB 9.9 (L) 10/16/2017 07:59 AM    HCT 31.0 (L) 10/16/2017 07:59 AM    PLATELET 297 10/16/2017 07:59 AM    MCV  87.6 10/16/2017 07:59 AM    ABS. NEUTROPHILS 3.0 10/16/2017 07:59 AM     Lab Results   Component Value Date/Time    Sodium 135 (L) 10/16/2017 07:59 AM    Potassium 4.1 10/16/2017 07:59 AM    Chloride 102 10/16/2017 07:59 AM    CO2 25 10/16/2017 07:59 AM    Glucose 88 10/16/2017 07:59 AM    BUN 10 10/16/2017 07:59 AM    Creatinine 0.68 (L) 10/16/2017 07:59 AM    GFR est AA >60 10/16/2017 07:59 AM    GFR est non-AA >60 10/16/2017 07:59 AM    Calcium 9.8 10/16/2017 07:59 AM     Lab Results   Component Value Date/Time    AST (SGOT) 12 (L) 10/16/2017 07:59 AM    Alk. phosphatase 74 10/16/2017 07:59 AM    Protein, total 8.3 (H) 10/16/2017 07:59 AM    Albumin 2.9 (L) 10/16/2017 07:59 AM    Globulin 5.4 (H) 10/16/2017 07:59 AM    Clark-G Ratio 0.5 (L) 10/16/2017 07:59 AM     Recent Labs     10/16/17  0759 10/06/17  1037 09/15/17  0909 09/01/17  0839 08/25/17  0959 07/13/17  0753 05/06/17  0923 03/23/17  0729 01/09/17  1032   PSA 0.1 0.1 0.1 0.1 0.1 0.1  --   --  0.1   PSALT  --   --   --   --   --   --  <0.1 0.3  --        IMAGING  PET CT  03/23/17  IMPRESSION: Mass lesion right lower lobe is hypermetabolic and compatible with  the biopsy confirmed the result of adenocarcinoma. There are soft tissue  densities in the anterior abdominal wall bilaterally which are stable compared  to the prior chest abdomen pelvis CT but new compared to the prior PET/CT.  Please correlate with any recent abdominal wall surgery as these may represent  port sites. Otherwise normal tracer distribution. Stable sclerotic osseous  metastatic disease without abnormal activity.    PATHOLOGY  Supraclavicular node   CYTOLOGIC INTERPRETATION:   Adenocarcinoma, consistent with prostate primary   See comment   General Categorization   Positive for malignancy.   Specimen Adequacy   Satisfactory for evaluation.   Comment    Touch preps and Clark core biopsy are examined and show islands of adenocarcinoma surrounded by fibrous stroma. Clark panel of immunohistochemical stains was performed to evaluate site of origin. The tumor cells are focally positive for PSA and PSAP and negative for CK7, CK20, TTF-1, CDX-2 and Bruce Clark. Morphology and immunoprofile are most consistent with Clark metastatic prostatic adenocarcinoma.    Pathology from CT guided biopsy of R lung mass  Lung adenocarcinoma    Pathology from RLL lobectomy on 05/22/17:  1. Soft tissue, chest wall margin, biopsy:   Fragments of soft tissue involved by adenocarcinoma   2. Lymph node, station 7, excision:   One lymph node, positive for metastatic adenocarcinoma (1/1)   See comment   3. Lung, right lower lobe, lobectomy:   Adenocarcinoma, 6.7 cm, with involvement of parietal pleura   Four hilar lymph nodes, negative for metastatic carcinoma (0/4)     Bone scan 07/13/17:  IMPRESSION: Metastatic disease thoracic and lumbar spine.    CT neck/chest/abd/pelvis 07/13/17:  IMPRESSION:  1. CT of the neck demonstrates no adenopathy. The supraglottic airway is close  related to technique.. There is mild asymmetry in the true vocal cords right is  more prominent than the left and correlation would be helpful.  2. CT of the chest demonstrates right lower lobe lobectomy changes. There is  pleural thickening in the right mid and lower thorax posteriorly most likely  postoperative. There is also slight soft tissue prominence at the postoperative  site in the right hilum again most likely postsurgical but close follow-up would  be helpful.  3. CT of the abdomen and pelvis demonstrates no adenopathy or mass. Slight  nodularity in the prostate gland is stable.  4. Bony metastatic disease to the spine is stable in appearance.    CT chest/abd/pelvis 10/10/17:  IMPRESSION:  1. 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 Clinical correlation is recommended.   2. Status post right lobectomy with postsurgical changes adjacent to the right  hilum and in the right pleura.  3. Stable osseous metastatic disease in the thoracic spine.    ASSESSMENT  Mr. Keil is Clark 67 y.o. male with widely metastatic prostate adenocarcinoma and newly diagnosed lung adenocarcinoma status post right lower lobe lobectomy on 05/22/17 and presents today for follow-up.     PLAN    Prostate cancer  Castrate sensitive, treatment na??ve widely metastatic prostate cancer to lymph nodes above and below the diaphragm and bones.  Prostate adenocarcinoma diagnosed on biopsy of the supraclavicular lymph node. Discussed in tumor board and Clark small cell component has been ruled out.  PSA at the time of diagnosis at 2400.    Currently on Lupron 45 mg every 6 months. On Zytiga 1,012m daily + prednisone 5 mg daily per LATTITUDE. Has been off Zytiga since 09/30/17 due to medication being stollen. Medication will be delivered tomorrow. Instructed him to call u[place]oreaif he has any issues regarding delivery.  He has continued to have Clark radiologic and PSA response  CBC, CMP, PSA monthly in [facility]  Lupron q6 months    R lung adenocarcinoma, T3N2MX stage IIIB- Parietal pleural involvement- PD-L1 99%  S/p RLL lobectomy on 10/5  Very high risk of recurrence  Case was discussed in tumor board and the consensus was to offer PORT with N2 disease  PORT with Dr. PLeron CroakCompleted  09/18/17.     He has grade 2 fatigue from RT. Will arrange for continued weekly hydration for support through the rest of this month then re-evaluate need.    As regards adjuvant chemotherapy- we elected to not pursue this with another life limiting illness. We decided to consider immunotherapy with any evidence of recurrence.    Repeat imaging shows no recurrence of lung cancer. 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 Well controlled BID.   Will obtain bone scan as pain is over ribs    Anemia  Was noted to have Clark new anemia of Hgb  8.4g/dL on 10/06/17  Up to 9.9g/dL on 3/1  Unclear etiology. Will continue to monitor. No signs of obvious bleeding.    Follow-up in 1 month    Juliene Pina, MD

## 2017-10-21 NOTE — Telephone Encounter (Signed)
Pt states he has not received his zytiga    HIPAA verified.  Spoke with Lucianne Lei at [facility] shipment 1/30.  Reviewed we sent a new script dated 10/02/17 as medications were stolen.    Researched.  Overnight deliver today for receipt tomorrow by [employer].  Verified address.  Patient aware.

## 2017-10-23 ENCOUNTER — Inpatient Hospital Stay: Admit: 2017-10-23 | Payer: MEDICAID | Primary: Internal Medicine

## 2017-10-23 DIAGNOSIS — C3431 Malignant neoplasm of lower lobe, right bronchus or lung: Secondary | ICD-10-CM

## 2017-10-23 MED ORDER — SODIUM CHLORIDE 0.9% BOLUS IV
0.9 % | Freq: Once | INTRAVENOUS | Status: AC
Start: 2017-10-23 — End: 2017-10-23
  Administered 2017-10-23: 13:00:00 via INTRAVENOUS

## 2017-10-23 MED FILL — SODIUM CHLORIDE 0.9 % IV: INTRAVENOUS | Qty: 1000

## 2017-10-23 NOTE — Progress Notes (Signed)
Problem: Patient Education:  Go to Education Activity  Goal: Patient/Family Education  Outcome: Resolved/Met Date Met: 10/23/17  Hydration

## 2017-10-23 NOTE — Progress Notes (Signed)
[facility] Note:  0800Pt arrived at [facility] ambulatory and in no distress for weekly hydration . No lab today..    Assessment stable, no new complaints voiced.      Medications received:  NS 1057ml    0915 Tolerated treatment well, no adverse reaction noted.  D/Cd from [facility] ambulatory and in no distress accompanied by self.  Next appt 3/15  Visit Vitals  BP 110/72   Pulse 84   Temp 98.2 ??F (36.8 ??C)   Resp 20     No results found for this or any previous visit (from the past 12 hour(s)).     Last labs done 10/16/17

## 2017-10-27 ENCOUNTER — Encounter: Payer: Charity | Primary: Internal Medicine

## 2017-10-30 ENCOUNTER — Inpatient Hospital Stay: Admit: 2017-10-30 | Payer: MEDICAID | Primary: Internal Medicine

## 2017-10-30 ENCOUNTER — Ambulatory Visit
Admit: 2017-10-30 | Discharge: 2017-10-30 | Payer: PRIVATE HEALTH INSURANCE | Attending: Registered Nurse | Primary: Internal Medicine

## 2017-10-30 DIAGNOSIS — C3431 Malignant neoplasm of lower lobe, right bronchus or lung: Secondary | ICD-10-CM

## 2017-10-30 LAB — CBC WITH AUTOMATED DIFF
ABS. BASOPHILS: 0 10*3/uL (ref 0.0–0.1)
ABS. EOSINOPHILS: 0.1 10*3/uL (ref 0.0–0.4)
ABS. IMM. GRANS.: 0 10*3/uL (ref 0.00–0.04)
ABS. LYMPHOCYTES: 0.6 10*3/uL — ABNORMAL LOW (ref 0.8–3.5)
ABS. MONOCYTES: 0.5 10*3/uL (ref 0.0–1.0)
ABS. NEUTROPHILS: 5.7 10*3/uL (ref 1.8–8.0)
ABSOLUTE NRBC: 0 10*3/uL (ref 0.00–0.01)
BASOPHILS: 0 % (ref 0–1)
EOSINOPHILS: 1 % (ref 0–7)
HCT: 28.2 % — ABNORMAL LOW (ref 36.6–50.3)
HGB: 9 g/dL — ABNORMAL LOW (ref 12.1–17.0)
IMMATURE GRANULOCYTES: 0 % (ref 0.0–0.5)
LYMPHOCYTES: 8 % — ABNORMAL LOW (ref 12–49)
MCH: 27.5 PG (ref 26.0–34.0)
MCHC: 31.9 g/dL (ref 30.0–36.5)
MCV: 86.2 FL (ref 80.0–99.0)
MONOCYTES: 7 % (ref 5–13)
MPV: 8.9 FL (ref 8.9–12.9)
NEUTROPHILS: 84 % — ABNORMAL HIGH (ref 32–75)
NRBC: 0 PER 100 WBC
PLATELET: 247 10*3/uL (ref 150–400)
RBC: 3.27 M/uL — ABNORMAL LOW (ref 4.10–5.70)
RDW: 13.1 % (ref 11.5–14.5)
WBC: 6.9 10*3/uL (ref 4.1–11.1)

## 2017-10-30 LAB — METABOLIC PANEL, COMPREHENSIVE
A-G Ratio: 0.6 — ABNORMAL LOW (ref 1.1–2.2)
ALT (SGPT): 8 U/L — ABNORMAL LOW (ref 12–78)
AST (SGOT): 15 U/L (ref 15–37)
Albumin: 2.6 g/dL — ABNORMAL LOW (ref 3.5–5.0)
Alk. phosphatase: 83 U/L (ref 45–117)
Anion gap: 9 mmol/L (ref 5–15)
BUN/Creatinine ratio: 18 (ref 12–20)
BUN: 10 MG/DL (ref 6–20)
Bilirubin, total: 0.4 MG/DL (ref 0.2–1.0)
CO2: 23 mmol/L (ref 21–32)
Calcium: 8.3 MG/DL — ABNORMAL LOW (ref 8.5–10.1)
Chloride: 103 mmol/L (ref 97–108)
Creatinine: 0.55 MG/DL — ABNORMAL LOW (ref 0.70–1.30)
GFR est AA: >60 mL/min/{1.73_m2} (ref >60)
GFR est non-AA: >60 mL/min/{1.73_m2} (ref >60)
Globulin: 4.6 g/dL — ABNORMAL HIGH (ref 2.0–4.0)
Glucose: 115 mg/dL — ABNORMAL HIGH (ref 65–100)
Potassium: 3.2 mmol/L — ABNORMAL LOW (ref 3.5–5.1)
Protein, total: 7.2 g/dL (ref 6.4–8.2)
Sodium: 135 mmol/L — ABNORMAL LOW (ref 136–145)

## 2017-10-30 MED ORDER — AMOXICILLIN CLAVULANATE 875 MG-125 MG TAB
875-125 mg | ORAL_TABLET | Freq: Two times a day (BID) | ORAL | 0 refills | Status: DC
Start: 2017-10-30 — End: 2018-01-21

## 2017-10-30 MED ORDER — SODIUM CHLORIDE 0.9% BOLUS IV
0.9 % | Freq: Once | INTRAVENOUS | Status: AC
Start: 2017-10-30 — End: 2017-10-30
  Administered 2017-10-30: 13:00:00 via INTRAVENOUS

## 2017-10-30 MED ORDER — BENZONATATE 200 MG CAP
200 mg | ORAL_CAPSULE | Freq: Three times a day (TID) | ORAL | 0 refills | Status: AC | PRN
Start: 2017-10-30 — End: 2017-11-06

## 2017-10-30 MED FILL — SODIUM CHLORIDE 0.9 % IV: INTRAVENOUS | Qty: 1000

## 2017-10-30 NOTE — Progress Notes (Signed)
Pt has been scheduled to see PCP Gearldine Shown at [facility] practice on 12/07/2017 at 10:30am

## 2017-10-30 NOTE — Progress Notes (Signed)
Reviewed. Patient starting antibiotics today.

## 2017-10-30 NOTE — Progress Notes (Signed)
[facility]   [address], [place] [address]   W: [phone]  F: [phone]  Medical Nutrition Therapy      Nutrition Encounter:   Met with patient and significant other. He reports no appetite, has hardly had anything to eat in the past 3 days. Yesterday he had 1/2 pancake.  SO reports plenty of food in the house, meals prepared and frozen by daughter.  Drinking 2 Boost per day.    Discussed importance of nutrition and preventing further weight loss.  Explained that he should think of food as medicine, eat based on time/schedule verses hunger cues.      Results:   Diagnosis: Lung cancer, completed radiation on 09/18/17.    Wt Readings from Last 5 Encounters:   10/30/17 134 lb 6.4 oz (61 kg)   10/21/17 135 lb (61.2 kg)   10/06/17 139 lb 6.4 oz (63.2 kg)   09/08/17 144 lb (65.3 kg)   09/08/17 144 lb 3.2 oz (65.4 kg)     Estimated Nutrition Needs:   Calorie Range: 1890-2205kcal/day  Protein Range: 63-73g/day     Fluid Needs: 2027m      Assessment:   Involuntary weight loss related to appetite loss as evidence by weight loss of 5# x 1 month.      Plan:   ??  Discussed consuming 5-6 small meals instead of 3 large meals.    ?? Drink nutrition supplements - samples and coupons provided.  ?? Keep snacks readily available and in eye sight to promote/stimulate appetite.    ?? Eat an extra bedtime snack.  This will give you extra calories but won't affect your appetite for the next meals.    ?? Think of food as medicine.      I appreciate the opportunity to participate in Mr. RCAIN FITZHENRYcare.    Signed By: LDrinda Butts [place]    Contact: [phone]

## 2017-10-30 NOTE — Progress Notes (Signed)
This note will not be viewable in [place].    Oncology Navigator  Psychosocial AssessmentReason for Assessment:    [] Depression  [] Anxiety  [] Caregiver Burden  [] Maladaptive Coping with Serious Illness   [x] Other: Initial assessment     Sources of Information:    [x] Patient  [] Family  [] Staff  [] Medical Record    Advance Care Planning:  Advance Care Planning 10/06/2017   Patient's Healthcare Decision Maker is: Verbal statement (Legal Next of Kin remains as Media planner)   Primary Decision Maker Name -   Primary Decision Maker Phone Number -   Primary Decision Maker Relationship to Patient -   Secondary Decision Maker Name -   Secondary Decision [place] Phone Number -   Secondary Decision Maker Relationship to Patient -   Confirm Advance Directive None   Patient Would Like to Complete Advance Directive No   Does the patient have other document types -   Patient has an AMD on file.  Patient's friends, Holley Bouche [phone]) and Jaclyn Shaggy [phone]) are listed as his [place].    Mental Status:    [x] Alert  [] Lethargic  [] Unresponsive  Oriented to:  [x] Person  [x] Place  [x] Time  [x] Situation      Barriers to Learning:    [] Language  [] Developmental  [] Cognitive  [] Altered Mental Status  [] Visual/Hearing Impairment  [] Unable to Read/Write  [] Motivational   [x] No Barriers Identified  [] Other:    Relationship Status:  [x] Single  [] Married  [] Significant Other/Life Partner  [] Divorced  [] Separated  [] Widowed      Living Circumstances:  [] Lives Alone  [] Family/Significant Other in Household  [x] Roommates  [] Children in the Home  [] Paid Caregivers  [] Assisted Living Facility/Group Home  [] [place]  [] Homeless  [] Incarcerated  [] Environmental/Care Concerns  [] Other:    Support System:    [x] Strong  [] Fair  [] Limited    Financial/Legal Concerns:    [] Uninsured  [x] Limited Income/Resources  [] Non-Citizen  [] No Concerns Identified  [] Financial POA:    [] Other:     Religious/Spiritual/Existential:  [] Strong Sense of Spirituality  [] Involved in [employer]  [] Request Chaplain Visit  [] Expressing Spiritual/Existential Angst  [x] No Concerns Identified    Coping with Illness:         Patient: Family/Caregiver:   Understanding and Acceptance of Illness/Prognosis  [x]  []    Strong Sense of Resilience [x]  []    Self Reflection [x]  []    Engaged Support System [x]  []    Does not Readily Discuss Illness []  []    Denial of Terminal Status []  []    Anger []  []    Depression []  []    Anxiety/Fear []  []    Bargaining []  []    Recent Diagnosis/Prognosis []  []    Difficulties with Body Image []  []    Loss of Identity []  []    Excessive Substance Use []  []    Mental Health History []  []    Enmeshed Relationships []  []    History of Loss []  []    Anticipatory Grief []  []    Concern for Complicated Grief []  []    Suicidal Ideation or Plan []  []    Unable to assess []  []               Narrative:   Met with the patient during his office visit along with his friend Holley Bouche.  Note that the patient is being followed for prostate cancer, and lung cancer, with metastasis to the bone.      The patient lives with his friend, [name].  He uses a cane for long  distance walking.  The patient receives retirement income.  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 He is from [place], and has 2 bothers and 2 sisters who still live in [place], [place]. He has no children.       He does not yet have a PCP, so this Education officer, museum provided a list of [facility] physicians from which to choose.  Colleague Dellia Nims, scheduled a new patient appointment for 12/07/17 at 10:30AM with Gearldine Shown, NP.  The patient has the Care Card, which expires 11/15/17.  Provided an application and instructed him to reapply.  A referral was also placed to colleague Reatha Harps, Care Management Assistant/Financial Counselor to follow  up with the patient.  The patient is already connected with assistance programs for Eliquis and Zytega.      Provided this social worker's contact information as well as the information regarding the complementary services offered by the [facility].      Referrals:     I.  Transportation    Medicaid Web designer) []    ACS Road to Recovery []                                     Regional organization  []                                       Financial Assistance/Medication Access    Patient assistance program (Care Card) []    Co-pay assistance  []                                     Leukemia & Lymphoma Society []    American Cancer Society  []    Patient Luck []    CancerCare  []      Emotional support    Peer support group []    Local counseling []                                     Online support group []    Coordination of psychiatry consult []      Goals/Plan:   1.  Introduced self and role of this Education officer, museum in the [employer].    2.  Informed the patient of the [facility] and available resources there.    3.  Continue to meet with the patient when he returns to the clinic for ongoing assessment of the patient???s adjustment to his diagnosis and treatment.    4.  Ongoing psychosocial support as desired by patient.    Ellis Savage, MSW

## 2017-10-30 NOTE — Progress Notes (Signed)
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 is a 67 y.o. male who is on Eliquis for a h/o DVT who presented to the Emergency Department on 04/22/16 with nausea, vomiting, and abdominal pain x 2 months, decreased appetite and weight loss of 10-14 lbs.  CTA revealed bilateral lung nodules and mediastinal/hilar adenopathy. PET revealed supraclavicular adenopathy, retroperitoneal adenopathy, bilateral iliac adenopathy, mediastinal/hilar adenopathy, bony metastatic disease, right lower lobe pulmonary mass, and multiple other smaller masses in bilateral lungs. USG guided bx of supraclavicular LN showed adenocarcinoma of the prostate. He was also diagnosed and treated for CAP. PSA on 05/26/16-2400. Was discussed in tumor board, Napsin A negative and a small cell component ruled out. Initiated Degarelix  05/26/16. MRI spine with expansile C7 lesion, T12 lesion encroaching epidural space, S1-S2 lesion encroaching neural foramina. Started palliative C6-T5 and T11-L5 XRT under Dr. Leron Croak on 06/09/16. Started Zytiga 08/28/16 with evidence of response. Noted to have a  Left lower lobe lung nodule on CT done 12/22/16. Biopsy consistent with Lung adenocarcinoma. He had a mediastinoscopy with LN being negative for metastatic disease. Robotic RLL lobectomy performed on 05/22/17.     He underwent a robotic right lower lobectomy on 05/22/17 with Dr. Glo Herring. Pathology showed adenocarcinoma T3N2 stage IIIB. Case discussed at tumor board and there is no plan for adjuvant chemotherapy at this time. He completed post op radiation with Dr. Leron Croak on 09/18/17.     He comes in today for interm visit. Not feeling well. He has fatigue that is not relieved by rest. He is coughing and has a runny nose. Phlegm is thick and clear. His pain over his right rib has not improved or worsened.  He is taking morphine twice daily. Does not wish to take this anymore then twice daily in fear of addiction. He re-started his Zytiga on 10/22/17. Continues to take Prednisone daily. He is not eating as he has no appetite. Denies nausea/vomiting or diarrhea/constipation. He denies fevers/chills. Denies SOB or CP. He denies dizziness, vision changes, or recent falls.     Otherwise, complete ROS is per the symptom report form which has been scanned into the media section of the electronic medical record.    Past Medical History:   Diagnosis Date   ??? Lung cancer ([place])    ??? Prostate cancer ([place])    ??? Stroke ([place])     PT STATES, "I HAD A STROKE A LONG TIME AGO"       Past Surgical History:   Procedure Laterality Date   ??? CHEST SURGERY PROCEDURE UNLISTED  04/27/2017    BRONCHOSCOPY/MEDIASTINOSCOPY   ??? HX GI      HERNIA REPAIR   ??? HX ORTHOPAEDIC  1994    ROD IN RIGHT LEG       No Known Allergies    Current Outpatient Medications   Medication Sig Dispense Refill   ??? apixaban (ELIQUIS) 5 mg tablet Take 1 Tab by mouth two (2) times a day. 14 Tab 0   ??? abiraterone (ZYTIGA) 500 mg tab Take 1,000 mg by mouth daily. 60 Tab 3   ??? morphine 10 mg/5 mL oral solution Take 2.5 mL by mouth every four (4) hours as needed for Pain. Max Daily Amount: 30 mg. 500 mL 0   ??? polyethylene glycol (MIRALAX) 17 gram packet Take 1  Packet by mouth daily. 30 Packet 2   ??? magic mouthwash solution Magic mouth wash   Maalox  Lidocaine 2% viscous   Diphenhydramine oral solution   Nystatin 100,000 units    Pharmacy to mix equal portions of ingredients to a total volume as indicated in the dispense amount.  Swish and spit 76m (1 tsp) four times daily as needed for mouth sores 480 mL 3   ??? predniSONE (DELTASONE) 5 mg tablet Take 1 Tab by mouth daily. Restart on Jun 01, 2017 30 Tab 3   ??? senna (SENOKOT) 8.6 mg tablet Take 1 Tab by mouth nightly. Indications: constipation 30 Tab 0       Social History     Socioeconomic History   ??? Marital status: SINGLE      Spouse name: Not on file   ??? Number of children: Not on file   ??? Years of education: Not on file   ??? Highest education level: Not on file   Tobacco Use   ??? Smoking status: Former Smoker     Packs/day: 0.50     Years: 47.00     Pack years: 23.50     Last attempt to quit: 06/2016     Years since quitting: 1.3   ??? Smokeless tobacco: Never Used   Substance and Sexual Activity   ??? Alcohol use: No   ??? Drug use: No       Family History   Problem Relation Age of Onset   ??? Cancer Mother         COLON   ??? Cancer Father         BRAIN TUMOR   ??? No Known Problems Sister    ??? Arthritis-osteo Brother    ??? No Known Problems Sister    ??? No Known Problems Sister    ??? No Known Problems Sister    ??? No Known Problems Brother    ??? Anesth Problems Neg Hx      ROS  A review of systems was obtained and is negative except as listed in HPI.  ECOG PS is 1    Physical Examination:   Visit Vitals  BP 114/77 (BP 1 Location: Right arm, BP Patient Position: Sitting)   Pulse 97   Temp 98.9 ??F (37.2 ??C) (Oral)   Resp 16   Ht 5' 7"  (1.702 m)   Wt 134 lb 6.4 oz (61 kg)   SpO2 97%   BMI 21.05 kg/m??     General appearance - alert, and in no distress  Mental status - oriented to person, place, and time  Mouth - mucous membranes moist, no lesions noted  Neck - supple, no significant adenopathy  Resp-CTA, diminished, normal respiratory effort  CV-s1s2, no LE edema  Neurological - normal speech, no focal findings or movement disorder noted  Musculoskeletal - no joint tenderness, deformity or swelling  Extremities - peripheral pulses normal, no pedal edema, no clubbing or cyanosis  Skin - robotic incision site and previous chest tube site on right side of chest appear to be healing appropriately, TTP    LABS  Lab Results   Component Value Date/Time    WBC 6.9 10/30/2017 09:28 AM    HGB 9.0 (L) 10/30/2017 09:28 AM    HCT 28.2 (L) 10/30/2017 09:28 AM    PLATELET 247 10/30/2017 09:28 AM    MCV 86.2 10/30/2017 09:28 AM     ABS. NEUTROPHILS PENDING 10/30/2017 09:28 AM     Lab  Results   Component Value Date/Time    Sodium 135 (L) 10/16/2017 07:59 AM    Potassium 4.1 10/16/2017 07:59 AM    Chloride 102 10/16/2017 07:59 AM    CO2 25 10/16/2017 07:59 AM    Glucose 88 10/16/2017 07:59 AM    BUN 10 10/16/2017 07:59 AM    Creatinine 0.68 (L) 10/16/2017 07:59 AM    GFR est AA >60 10/16/2017 07:59 AM    GFR est non-AA >60 10/16/2017 07:59 AM    Calcium 9.8 10/16/2017 07:59 AM     Lab Results   Component Value Date/Time    AST (SGOT) 12 (L) 10/16/2017 07:59 AM    Alk. phosphatase 74 10/16/2017 07:59 AM    Protein, total 8.3 (H) 10/16/2017 07:59 AM    Albumin 2.9 (L) 10/16/2017 07:59 AM    Globulin 5.4 (H) 10/16/2017 07:59 AM    A-G Ratio 0.5 (L) 10/16/2017 07:59 AM     Recent Labs     10/16/17  0759 10/06/17  1037 09/15/17  0909 09/01/17  0839 08/25/17  0959 07/13/17  0753 05/06/17  0923 03/23/17  0729 01/09/17  1032   PSA 0.1 0.1 0.1 0.1 0.1 0.1  --   --  0.1   PSALT  --   --   --   --   --   --  <0.1 0.3  --        IMAGING  PET CT  03/23/17  IMPRESSION: Mass lesion right lower lobe is hypermetabolic and compatible with  the biopsy confirmed the result of adenocarcinoma. There are soft tissue  densities in the anterior abdominal wall bilaterally which are stable compared  to the prior chest abdomen pelvis CT but new compared to the prior PET/CT.  Please correlate with any recent abdominal wall surgery as these may represent  port sites. Otherwise normal tracer distribution. Stable sclerotic osseous  metastatic disease without abnormal activity.    PATHOLOGY  Supraclavicular node   CYTOLOGIC INTERPRETATION:   Adenocarcinoma, consistent with prostate primary   See comment   General Categorization   Positive for malignancy.   Specimen Adequacy   Satisfactory for evaluation.   Comment   Touch preps and a core biopsy are examined and show islands of adenocarcinoma surrounded by fibrous stroma. A panel of  immunohistochemical stains was performed to evaluate site of origin. The tumor cells are focally positive for PSA and PSAP and negative for CK7, CK20, TTF-1, CDX-2 and Napsin A. Morphology and immunoprofile are most consistent with a metastatic prostatic adenocarcinoma.    Pathology from CT guided biopsy of R lung mass  Lung adenocarcinoma    Pathology from RLL lobectomy on 05/22/17:  1. Soft tissue, chest wall margin, biopsy:   Fragments of soft tissue involved by adenocarcinoma   2. Lymph node, station 7, excision:   One lymph node, positive for metastatic adenocarcinoma (1/1)   See comment   3. Lung, right lower lobe, lobectomy:   Adenocarcinoma, 6.7 cm, with involvement of parietal pleura   Four hilar lymph nodes, negative for metastatic carcinoma (0/4)     Bone scan 07/13/17:  IMPRESSION: Metastatic disease thoracic and lumbar spine.    CT neck/chest/abd/pelvis 07/13/17:  IMPRESSION:  1. CT of the neck demonstrates no adenopathy. The supraglottic airway is close  related to technique.. There is mild asymmetry in the true vocal cords right is  more prominent than the left and correlation would be helpful.  2. CT of the chest demonstrates right  lower lobe lobectomy changes. There is  pleural thickening in the right mid and lower thorax posteriorly most likely  postoperative. There is also slight soft tissue prominence at the postoperative  site in the right hilum again most likely postsurgical but close follow-up would  be helpful.  3. CT of the abdomen and pelvis demonstrates no adenopathy or mass. Slight  nodularity in the prostate gland is stable.  4. Bony metastatic disease to the spine is stable in appearance.    CT chest/abd/pelvis 10/10/17:  IMPRESSION:  1. 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 Clinical correlation is recommended.  2. Status post right lobectomy with postsurgical changes adjacent to the right  hilum and in the right pleura.   3. Stable osseous metastatic disease in the thoracic spine.    ASSESSMENT  Mr. Kemppainen is a 67 y.o. male with widely metastatic prostate adenocarcinoma and newly diagnosed lung adenocarcinoma status post right lower lobe lobectomy on 05/22/17 and presents today for follow-up.     PLAN    Prostate cancer  Castrate sensitive, treatment na??ve widely metastatic prostate cancer to lymph nodes above and below the diaphragm and bones.  Prostate adenocarcinoma diagnosed on biopsy of the supraclavicular lymph node. Discussed in tumor board and a small cell component has been ruled out.  PSA at the time of diagnosis at 2400.    Currently on Lupron 45 mg every 6 months. On Zytiga 1,052m daily + prednisone 5 mg daily per LATTITUDE. Off Zytiga 09/30/17-10/21/17 due to medication being stollen.   He has continued to have a radiologic and PSA response  CBC, CMP, PSA monthly in [facility]  Lupron q6 months    R lung adenocarcinoma, T3N2MX stage IIIB- Parietal pleural involvement- PD-L1 99%  S/p RLL lobectomy on 10/5  Very high risk of recurrence  Case was discussed in tumor board and the consensus was to offer PORT with N2 disease  PORT with Dr. PLeron CroakCompleted  09/18/17.     He has grade 2 fatigue from RT and anorexia. Will arrange for continued weekly hydration for support through the rest of this month then re-evaluate need. Will add additional hydration Monday 11/02/17 for additional support.     As regards adjuvant chemotherapy- we elected to not pursue this with another life limiting illness. We decided to consider immunotherapy with any evidence of recurrence.    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 Advised he can take this every 4 hours.   Will obtain bone scan as pain is over ribs, scheduled 11/04/17    Anemia  Was noted to have a new anemia of Hgb 8.4g/dL on 10/06/17  Up to 9.9g/dL on 3/1  Unclear etiology. Will continue to monitor. No signs of obvious bleeding.    Cough/URI  Will obtain CXR  as symptoms have persisted ~2 weeks  Augmentin BID x 7 days  Benzonatate 250m TID PRN for cough    Follow-up next week for bone scan review and assessment of symptoms     CXR reviewed and notable for R hilar fullness with infiltrate, if his symptoms do not improve within a week on antibiotics will obtain a CT chest   RJuliene Pina MD

## 2017-10-30 NOTE — Progress Notes (Signed)
[facility] - Chemotherapy Progress Note    3419 Pt admit to [facility] for hydration ambulatory in stable condition. Assessment completed. No new concerns voiced. PIV access established in L arm with positive blood return. Line flushed, hydration infusing.     Visit Vitals  BP 112/77   Pulse 99   Temp 98.2 ??F (36.8 ??C)   Resp 18       Medications:  NS 1 L    0945 Pt tolerated treatment well. PIV removed at discharge.  D/c home ambulatory in no distress. Pt aware of next [facility] appointment scheduled for 11/02/2017.

## 2017-10-30 NOTE — Progress Notes (Signed)
Bruce Clark is a 67 y.o. male here today for follow up, prostate cancer.     Patient states that he hasnt eaten in two days, decreased appetite. Some nausea with vomiting. Coughing up secretions/coughing up blood in sputum yesterday. Cough has continued to increase since last visit. Pain to abdomen increased 8/10 today.     1. Have you been to the ER, urgent care clinic since your last visit?  Hospitalized since your last visit? No     2. Have you seen or consulted any other health care providers outside of the [place] [facility] since your last visit?  Include any pap smears or colon screening. No

## 2017-11-02 ENCOUNTER — Inpatient Hospital Stay: Admit: 2017-11-02 | Payer: MEDICAID | Primary: Internal Medicine

## 2017-11-02 DIAGNOSIS — C3431 Malignant neoplasm of lower lobe, right bronchus or lung: Secondary | ICD-10-CM

## 2017-11-02 MED ORDER — SODIUM CHLORIDE 0.9 % IJ SYRG
INTRAMUSCULAR | Status: DC | PRN
Start: 2017-11-02 — End: 2017-11-03
  Administered 2017-11-02: 13:00:00 via INTRAVENOUS

## 2017-11-02 MED ORDER — SODIUM CHLORIDE 0.9% BOLUS IV
0.9 % | Freq: Once | INTRAVENOUS | Status: AC
Start: 2017-11-02 — End: 2017-11-02
  Administered 2017-11-02: 13:00:00 via INTRAVENOUS

## 2017-11-02 MED FILL — SODIUM CHLORIDE 0.9 % IV: INTRAVENOUS | Qty: 1000

## 2017-11-02 NOTE — Progress Notes (Signed)
Pt arrived to [facility] ambulatory in no acute distress at 0800 for Hydration.?? Assessment unremarkable except generalized fatigue..  IV established in Left forearm site WNL.positive blood return noted.??    Visit Vitals  BP 126/80 (BP 1 Location: Left arm, BP Patient Position: At rest)   Pulse 77   Temp 97.2 ??F (36.2 ??C)   Resp 18   SpO2 98%       The following medications administered:  NS 1 Liter IV    Visit Vitals  BP 152/75 (BP 1 Location: Right arm, BP Patient Position: At rest)   Pulse 69   Temp 97.2 ??F (36.2 ??C)   Resp 16   SpO2 98%     Pt tolerated treatment well.?? IV flushed per policy and removed, 2x2 and coban placed.?? Pt discharged ambulatory in no acute distress at 0945, accompanied by Self.  Next appointment 11/06/17 @ 8 am..

## 2017-11-04 ENCOUNTER — Inpatient Hospital Stay: Admit: 2017-11-04 | Payer: MEDICAID | Attending: Registered Nurse | Primary: Internal Medicine

## 2017-11-04 ENCOUNTER — Ambulatory Visit
Admit: 2017-11-04 | Discharge: 2017-11-04 | Payer: PRIVATE HEALTH INSURANCE | Attending: Registered Nurse | Primary: Internal Medicine

## 2017-11-04 DIAGNOSIS — C3431 Malignant neoplasm of lower lobe, right bronchus or lung: Secondary | ICD-10-CM

## 2017-11-04 MED ORDER — TECHNETIUM TC 99M MEDRONATE IV KIT
Freq: Once | Status: AC
Start: 2017-11-04 — End: 2017-11-04
  Administered 2017-11-04: 14:00:00 via INTRAVENOUS

## 2017-11-04 NOTE — Progress Notes (Signed)
Bruce Clark is a 67 y.o. male here today for follow up, lung cancer.     1. Have you been to the ER, urgent care clinic since your last visit?  Hospitalized since your last visit? No     2. Have you seen or consulted any other health care providers outside of the [facility] since your last visit?  Include any pap smears or colon screening. No

## 2017-11-04 NOTE — Progress Notes (Signed)
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 male who is on Eliquis for a h/o DVT who presented to the Emergency Department on 04/22/16 with nausea, vomiting, and abdominal pain x 2 months, decreased appetite and weight loss of 10-14 lbs.  CTA revealed bilateral lung nodules and mediastinal/hilar adenopathy. PET revealed supraclavicular adenopathy, retroperitoneal adenopathy, bilateral iliac adenopathy, mediastinal/hilar adenopathy, bony metastatic disease, right lower lobe pulmonary mass, and multiple other smaller masses in bilateral lungs. USG guided bx of supraclavicular LN showed adenocarcinoma of the prostate. He was also diagnosed and treated for CAP. PSA on 05/26/16-2400. Was discussed in tumor board, Napsin A negative and a small cell component ruled out. Initiated Degarelix  05/26/16. MRI spine with expansile C7 lesion, T12 lesion encroaching epidural space, S1-S2 lesion encroaching neural foramina. Started palliative C6-T5 and T11-L5 XRT under Dr. Leron Croak on 06/09/16. Started Zytiga 08/28/16 with evidence of response. Noted to have a  Left lower lobe lung nodule on CT done 12/22/16. Biopsy consistent with Lung adenocarcinoma. He had a mediastinoscopy with LN being negative for metastatic disease. Robotic RLL lobectomy performed on 05/22/17.     He underwent a robotic right lower lobectomy on 05/22/17 with Dr. Glo Herring. Pathology showed adenocarcinoma T3N2 stage IIIB. Case discussed at tumor board and there is no plan for adjuvant chemotherapy at this time. He completed post op radiation with Dr. Leron Croak on 09/18/17.     Comes in today for follow-up. He is feeling much better. Currently taking course of Augmentin and has 2 days left. States cough has almost subsided. He denies SOB. Denies fevers/chills. Having normal BMs. He has had an  increased appetite and denies nausea/vomiting. Continues to take Prednisone and Zytiga daily. Walked 1 mile yesterday and denies headaches, dizziness, vision changes, or recent falls.     Otherwise, complete ROS is per the symptom report form which has been scanned into the media section of the electronic medical record.    Past Medical History:   Diagnosis Date   ??? Lung cancer ([place])    ??? Prostate cancer ([place])    ??? Stroke ([place])     PT STATES, "I HAD A STROKE A LONG TIME AGO"       Past Surgical History:   Procedure Laterality Date   ??? CHEST SURGERY PROCEDURE UNLISTED  04/27/2017    BRONCHOSCOPY/MEDIASTINOSCOPY   ??? HX GI      HERNIA REPAIR   ??? HX ORTHOPAEDIC  1994    ROD IN RIGHT LEG       No Known Allergies    Current Outpatient Medications   Medication Sig Dispense Refill   ??? amoxicillin-clavulanate (AUGMENTIN) 875-125 mg per tablet Take 1 Tab by mouth every twelve (12) hours. 14 Tab 0   ??? benzonatate (TESSALON) 200 mg capsule Take 1 Cap by mouth three (3) times daily as needed for Cough for up to 7 days. 21 Cap 0   ??? apixaban (ELIQUIS) 5 mg tablet Take 1 Tab by mouth two (2) times a day. 14 Tab 0   ??? abiraterone (ZYTIGA) 500 mg tab Take 1,000 mg by mouth daily. 60 Tab 3   ??? morphine 10 mg/5 mL oral solution Take 2.5 mL by mouth every four (4) hours as needed for Pain. Max Daily Amount: 30 mg. 500 mL 0   ???  polyethylene glycol (MIRALAX) 17 gram packet Take 1 Packet by mouth daily. 30 Packet 2   ??? magic mouthwash solution Magic mouth wash   Maalox  Lidocaine 2% viscous   Diphenhydramine oral solution   Nystatin 100,000 units    Pharmacy to mix equal portions of ingredients to a total volume as indicated in the dispense amount.  Swish and spit 83m (1 tsp) four times daily as needed for mouth sores 480 mL 3   ??? predniSONE (DELTASONE) 5 mg tablet Take 1 Tab by mouth daily. Restart on Jun 01, 2017 30 Tab 3   ??? senna (SENOKOT) 8.6 mg tablet Take 1 Tab by mouth nightly. Indications: constipation 30 Tab 0        Social History     Socioeconomic History   ??? Marital status: SINGLE     Spouse name: Not on file   ??? Number of children: Not on file   ??? Years of education: Not on file   ??? Highest education level: Not on file   Tobacco Use   ??? Smoking status: Former Smoker     Packs/day: 0.50     Years: 47.00     Pack years: 23.50     Last attempt to quit: 06/2016     Years since quitting: 1.3   ??? Smokeless tobacco: Never Used   Substance and Sexual Activity   ??? Alcohol use: No   ??? Drug use: No       Family History   Problem Relation Age of Onset   ??? Cancer Mother         COLON   ??? Cancer Father         BRAIN TUMOR   ??? No Known Problems Sister    ??? Arthritis-osteo Brother    ??? No Known Problems Sister    ??? No Known Problems Sister    ??? No Known Problems Sister    ??? No Known Problems Brother    ??? Anesth Problems Neg Hx      ROS  A review of systems was obtained and is negative except as listed in HPI.  ECOG PS is 1    Physical Examination:   Visit Vitals  BP 137/90 (BP 1 Location: Left arm, BP Patient Position: Sitting)   Pulse 93   Temp 97.8 ??F (36.6 ??C) (Oral)   Resp 16   Ht 5' 7"  (1.702 m)   Wt 141 lb (64 kg)   SpO2 100%   BMI 22.08 kg/m??     General appearance - alert, and in no distress  Mental status - oriented to person, place, and time  Mouth - mucous membranes moist, no lesions noted  Neck - supple, no significant adenopathy  Resp-CTA, diminished, normal respiratory effort  CV-s1s2, no LE edema  Neurological - normal speech, no focal findings or movement disorder noted  Musculoskeletal - no joint tenderness, deformity or swelling  Extremities - peripheral pulses normal, no pedal edema, no clubbing or cyanosis  Skin - robotic incision site and previous chest tube site on right side of chest appear to be healing appropriately, TTP    LABS  Lab Results   Component Value Date/Time    WBC 6.9 10/30/2017 09:28 AM    HGB 9.0 (L) 10/30/2017 09:28 AM    HCT 28.2 (L) 10/30/2017 09:28 AM    PLATELET 247 10/30/2017 09:28 AM     MCV 86.2 10/30/2017 09:28 AM    ABS. NEUTROPHILS 5.7 10/30/2017 09:28  AM     Lab Results   Component Value Date/Time    Sodium 135 (L) 10/30/2017 09:28 AM    Potassium 3.2 (L) 10/30/2017 09:28 AM    Chloride 103 10/30/2017 09:28 AM    CO2 23 10/30/2017 09:28 AM    Glucose 115 (H) 10/30/2017 09:28 AM    BUN 10 10/30/2017 09:28 AM    Creatinine 0.55 (L) 10/30/2017 09:28 AM    GFR est AA >60 10/30/2017 09:28 AM    GFR est non-AA >60 10/30/2017 09:28 AM    Calcium 8.3 (L) 10/30/2017 09:28 AM     Lab Results   Component Value Date/Time    AST (SGOT) 15 10/30/2017 09:28 AM    Alk. phosphatase 83 10/30/2017 09:28 AM    Protein, total 7.2 10/30/2017 09:28 AM    Albumin 2.6 (L) 10/30/2017 09:28 AM    Globulin 4.6 (H) 10/30/2017 09:28 AM    A-G Ratio 0.6 (L) 10/30/2017 09:28 AM     Recent Labs     10/16/17  0759 10/06/17  1037 09/15/17  0909 09/01/17  0839 08/25/17  0959 07/13/17  0753 05/06/17  0923 03/23/17  0729 01/09/17  1032   PSA 0.1 0.1 0.1 0.1 0.1 0.1  --   --  0.1   PSALT  --   --   --   --   --   --  <0.1 0.3  --        IMAGING  PET CT  03/23/17  IMPRESSION: Mass lesion right lower lobe is hypermetabolic and compatible with  the biopsy confirmed the result of adenocarcinoma. There are soft tissue  densities in the anterior abdominal wall bilaterally which are stable compared  to the prior chest abdomen pelvis CT but new compared to the prior PET/CT.  Please correlate with any recent abdominal wall surgery as these may represent  port sites. Otherwise normal tracer distribution. Stable sclerotic osseous  metastatic disease without abnormal activity.    PATHOLOGY  Supraclavicular node   CYTOLOGIC INTERPRETATION:   Adenocarcinoma, consistent with prostate primary   See comment   General Categorization   Positive for malignancy.   Specimen Adequacy   Satisfactory for evaluation.   Comment   Touch preps and a core biopsy are examined and show islands of adenocarcinoma surrounded by fibrous stroma. A panel of  immunohistochemical stains was performed to evaluate site of origin. The tumor cells are focally positive for PSA and PSAP and negative for CK7, CK20, TTF-1, CDX-2 and Napsin A. Morphology and immunoprofile are most consistent with a metastatic prostatic adenocarcinoma.    Pathology from CT guided biopsy of R lung mass  Lung adenocarcinoma    Pathology from RLL lobectomy on 05/22/17:  1. Soft tissue, chest wall margin, biopsy:   Fragments of soft tissue involved by adenocarcinoma   2. Lymph node, station 7, excision:   One lymph node, positive for metastatic adenocarcinoma (1/1)   See comment   3. Lung, right lower lobe, lobectomy:   Adenocarcinoma, 6.7 cm, with involvement of parietal pleura   Four hilar lymph nodes, negative for metastatic carcinoma (0/4)     Bone scan 07/13/17:  IMPRESSION: Metastatic disease thoracic and lumbar spine.    CT neck/chest/abd/pelvis 07/13/17:  IMPRESSION:  1. CT of the neck demonstrates no adenopathy. The supraglottic airway is close  related to technique.. There is mild asymmetry in the true vocal cords right is  more prominent than the left and correlation would be helpful.  2. CT of the chest demonstrates right lower lobe lobectomy changes. There is  pleural thickening in the right mid and lower thorax posteriorly most likely  postoperative. There is also slight soft tissue prominence at the postoperative  site in the right hilum again most likely postsurgical but close follow-up would  be helpful.  3. CT of the abdomen and pelvis demonstrates no adenopathy or mass. Slight  nodularity in the prostate gland is stable.  4. Bony metastatic disease to the spine is stable in appearance.    CT chest/abd/pelvis 10/10/17:  IMPRESSION:  1. 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 Clinical correlation is recommended.  2. Status post right lobectomy with postsurgical changes adjacent to the right  hilum and in the right pleura.   3. Stable osseous metastatic disease in the thoracic spine.    ASSESSMENT  Mr. Goodbar is a 67 y.o. male with widely metastatic prostate adenocarcinoma and newly diagnosed lung adenocarcinoma status post right lower lobe lobectomy on 05/22/17 and presents today for follow-up.     PLAN    Prostate cancer  Castrate sensitive, treatment na??ve widely metastatic prostate cancer to lymph nodes above and below the diaphragm and bones.  Prostate adenocarcinoma diagnosed on biopsy of the supraclavicular lymph node. Discussed in tumor board and a small cell component has been ruled out.  PSA at the time of diagnosis at 2400.    Currently on Lupron 45 mg every 6 months. On Zytiga 1,060m daily + prednisone 5 mg daily per LATTITUDE. Off Zytiga 09/30/17-10/21/17 due to medication being stollen.   He has continued to have a radiologic and PSA response  CBC, CMP, PSA monthly in [facility]  Lupron q6 months    R lung adenocarcinoma, T3N2MX stage IIIB- Parietal pleural involvement- PD-L1 99%  S/p RLL lobectomy on 10/5  Very high risk of recurrence  Case was discussed in tumor board and the consensus was to offer PORT with N2 disease  PORT with Dr. PLeron CroakCompleted  09/18/17.     He has grade 2 fatigue from RT and anorexia. Will continue weekly hydration for now and re-assess at 11/25/17 appointment.     As regards adjuvant chemotherapy- we elected to not pursue this with another life limiting illness. We decided to consider immunotherapy with any evidence of recurrence.    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 Advised he can take this every 4 hours.   Bone scan done today, results pending    Anemia  Was noted to have a new anemia of Hgb 8.4g/dL on 10/06/17  Up to 9.9g/dL on 3/1  Unclear etiology. Will continue to monitor. No signs of obvious bleeding.    Cough/URI  CXR shows R hilar fullness with infiltrate  Augmentin BID x 7 days. Still taking for 2 more days.   Benzonatate 220m TID PRN for cough  Symptoms improved today. Advised he should continue full course of antibiotics despite feeling better.     Follow-up 11/25/17 for bone scan review or sooner if indicated    RJuliene Pina MD

## 2017-11-06 ENCOUNTER — Inpatient Hospital Stay: Admit: 2017-11-06 | Payer: MEDICAID | Primary: Internal Medicine

## 2017-11-06 DIAGNOSIS — C3431 Malignant neoplasm of lower lobe, right bronchus or lung: Secondary | ICD-10-CM

## 2017-11-06 LAB — METABOLIC PANEL, COMPREHENSIVE
A-G Ratio: 0.6 — ABNORMAL LOW (ref 1.1–2.2)
ALT (SGPT): 9 U/L — ABNORMAL LOW (ref 12–78)
AST (SGOT): 21 U/L (ref 15–37)
Albumin: 2.8 g/dL — ABNORMAL LOW (ref 3.5–5.0)
Alk. phosphatase: 81 U/L (ref 45–117)
Anion gap: 7 mmol/L (ref 5–15)
BUN/Creatinine ratio: 21 — ABNORMAL HIGH (ref 12–20)
BUN: 10 MG/DL (ref 6–20)
Bilirubin, total: 0.4 MG/DL (ref 0.2–1.0)
CO2: 27 mmol/L (ref 21–32)
Calcium: 9.3 MG/DL (ref 8.5–10.1)
Chloride: 102 mmol/L (ref 97–108)
Creatinine: 0.47 MG/DL — ABNORMAL LOW (ref 0.70–1.30)
GFR est AA: >60 mL/min/{1.73_m2} (ref >60)
GFR est non-AA: >60 mL/min/{1.73_m2} (ref >60)
Globulin: 5 g/dL — ABNORMAL HIGH (ref 2.0–4.0)
Glucose: 81 mg/dL (ref 65–100)
Potassium: 4.6 mmol/L (ref 3.5–5.1)
Protein, total: 7.8 g/dL (ref 6.4–8.2)
Sodium: 136 mmol/L (ref 136–145)

## 2017-11-06 LAB — CBC WITH AUTOMATED DIFF
ABS. BASOPHILS: 0 10*3/uL (ref 0.0–0.1)
ABS. EOSINOPHILS: 0.3 10*3/uL (ref 0.0–0.4)
ABS. IMM. GRANS.: 0.1 10*3/uL — ABNORMAL HIGH (ref 0.00–0.04)
ABS. LYMPHOCYTES: 0.7 10*3/uL — ABNORMAL LOW (ref 0.8–3.5)
ABS. MONOCYTES: 0.7 10*3/uL (ref 0.0–1.0)
ABS. NEUTROPHILS: 3.8 10*3/uL (ref 1.8–8.0)
ABSOLUTE NRBC: 0 10*3/uL (ref 0.00–0.01)
BASOPHILS: 0 % (ref 0–1)
EOSINOPHILS: 6 % (ref 0–7)
HCT: 30.9 % — ABNORMAL LOW (ref 36.6–50.3)
HGB: 9.8 g/dL — ABNORMAL LOW (ref 12.1–17.0)
IMMATURE GRANULOCYTES: 1 % — ABNORMAL HIGH (ref 0.0–0.5)
LYMPHOCYTES: 12 % (ref 12–49)
MCH: 28.1 PG (ref 26.0–34.0)
MCHC: 31.7 g/dL (ref 30.0–36.5)
MCV: 88.5 FL (ref 80.0–99.0)
MONOCYTES: 13 % (ref 5–13)
MPV: 8.7 FL — ABNORMAL LOW (ref 8.9–12.9)
NEUTROPHILS: 68 % (ref 32–75)
NRBC: 0 PER 100 WBC
PLATELET: 341 10*3/uL (ref 150–400)
RBC: 3.49 M/uL — ABNORMAL LOW (ref 4.10–5.70)
RDW: 13.8 % (ref 11.5–14.5)
WBC: 5.6 10*3/uL (ref 4.1–11.1)

## 2017-11-06 MED ORDER — SODIUM CHLORIDE 0.9% BOLUS IV
0.9 % | Freq: Once | INTRAVENOUS | Status: AC
Start: 2017-11-06 — End: 2017-11-06
  Administered 2017-11-06: 12:00:00 via INTRAVENOUS

## 2017-11-06 MED FILL — SODIUM CHLORIDE 0.9 % IV: INTRAVENOUS | Qty: 1000

## 2017-11-06 NOTE — Progress Notes (Signed)
0800 Pt admit to [facility] for Hydration/Labs ambulatory in stable condition. Assessment completed. No new concerns voiced. Peripheral IV established with positive blood return. Labs drawn per order and sent for     Visit Vitals  BP 123/78   Pulse 84   Temp 98.4 ??F (36.9 ??C)   Resp 16       Medications:  Normal Saline 1090m    0910 Pt tolerated treatment well. Peripheral IV maintained positive blood return throughout treatment, flushed with positive blood return and removed at conclusion. D/c home ambulatory in no distress. Pt aware of next [facility] appointment scheduled for 11/13/17.    Recent Results (from the past 12 hour(s))   CBC WITH AUTOMATED DIFF    Collection Time: 11/06/17  7:58 AM   Result Value Ref Range    WBC 5.6 4.1 - 11.1 K/uL    RBC 3.49 (L) 4.10 - 5.70 M/uL    HGB 9.8 (L) 12.1 - 17.0 g/dL    HCT 30.9 (L) 36.6 - 50.3 %    MCV 88.5 80.0 - 99.0 FL    MCH 28.1 26.0 - 34.0 PG    MCHC 31.7 30.0 - 36.5 g/dL    RDW 13.8 11.5 - 14.5 %    PLATELET 341 150 - 400 K/uL    MPV 8.7 (L) 8.9 - 12.9 FL    NRBC 0.0 0 PER 100 WBC    ABSOLUTE NRBC 0.00 0.00 - 0.01 K/uL    NEUTROPHILS 68 32 - 75 %    LYMPHOCYTES 12 12 - 49 %    MONOCYTES 13 5 - 13 %    EOSINOPHILS 6 0 - 7 %    BASOPHILS 0 0 - 1 %    IMMATURE GRANULOCYTES 1 (H) 0.0 - 0.5 %    ABS. NEUTROPHILS 3.8 1.8 - 8.0 K/UL    ABS. LYMPHOCYTES 0.7 (L) 0.8 - 3.5 K/UL    ABS. MONOCYTES 0.7 0.0 - 1.0 K/UL    ABS. EOSINOPHILS 0.3 0.0 - 0.4 K/UL    ABS. BASOPHILS 0.0 0.0 - 0.1 K/UL    ABS. IMM. GRANS. 0.1 (H) 0.00 - 0.04 K/UL    DF SMEAR SCANNED      RBC COMMENTS NORMOCYTIC, NORMOCHROMIC     METABOLIC PANEL, COMPREHENSIVE    Collection Time: 11/06/17  7:58 AM   Result Value Ref Range    Sodium 136 136 - 145 mmol/L    Potassium 4.6 3.5 - 5.1 mmol/L    Chloride 102 97 - 108 mmol/L    CO2 27 21 - 32 mmol/L    Anion gap 7 5 - 15 mmol/L    Glucose 81 65 - 100 mg/dL    BUN 10 6 - 20 MG/DL    Creatinine 0.47 (L) 0.70 - 1.30 MG/DL    BUN/Creatinine ratio 21 (H) 12 - 20       GFR est AA >60 >60 ml/min/1.766m   GFR est non-AA >60 >60 ml/min/1.7362m  Calcium 9.3 8.5 - 10.1 MG/DL    Bilirubin, total 0.4 0.2 - 1.0 MG/DL    ALT (SGPT) 9 (L) 12 - 78 U/L    AST (SGOT) 21 15 - 37 U/L    Alk. phosphatase 81 45 - 117 U/L    Protein, total 7.8 6.4 - 8.2 g/dL    Albumin 2.8 (L) 3.5 - 5.0 g/dL    Globulin 5.0 (H) 2.0 - 4.0 g/dL    A-G Ratio 0.6 (L) 1.1 -  2.2

## 2017-11-13 ENCOUNTER — Inpatient Hospital Stay: Admit: 2017-11-13 | Payer: MEDICAID | Primary: Internal Medicine

## 2017-11-13 LAB — CBC WITH AUTOMATED DIFF
ABS. BASOPHILS: 0 10*3/uL (ref 0.0–0.1)
ABS. EOSINOPHILS: 0.1 10*3/uL (ref 0.0–0.4)
ABS. IMM. GRANS.: 0 10*3/uL (ref 0.00–0.04)
ABS. LYMPHOCYTES: 0.6 10*3/uL — ABNORMAL LOW (ref 0.8–3.5)
ABS. MONOCYTES: 0.6 10*3/uL (ref 0.0–1.0)
ABS. NEUTROPHILS: 3.2 10*3/uL (ref 1.8–8.0)
ABSOLUTE NRBC: 0 10*3/uL (ref 0.00–0.01)
BASOPHILS: 1 % (ref 0–1)
EOSINOPHILS: 2 % (ref 0–7)
HCT: 29.6 % — ABNORMAL LOW (ref 36.6–50.3)
HGB: 9.3 g/dL — ABNORMAL LOW (ref 12.1–17.0)
IMMATURE GRANULOCYTES: 0 % (ref 0.0–0.5)
LYMPHOCYTES: 14 % (ref 12–49)
MCH: 28.4 PG (ref 26.0–34.0)
MCHC: 31.4 g/dL (ref 30.0–36.5)
MCV: 90.2 FL (ref 80.0–99.0)
MONOCYTES: 13 % (ref 5–13)
MPV: 8.9 FL (ref 8.9–12.9)
NEUTROPHILS: 70 % (ref 32–75)
NRBC: 0 PER 100 WBC
PLATELET: 265 10*3/uL (ref 150–400)
RBC: 3.28 M/uL — ABNORMAL LOW (ref 4.10–5.70)
RDW: 14.4 % (ref 11.5–14.5)
WBC: 4.5 10*3/uL (ref 4.1–11.1)

## 2017-11-13 LAB — METABOLIC PANEL, COMPREHENSIVE
A-G Ratio: 0.5 — ABNORMAL LOW (ref 1.1–2.2)
ALT (SGPT): 13 U/L (ref 12–78)
AST (SGOT): 57 U/L — ABNORMAL HIGH (ref 15–37)
Albumin: 2.7 g/dL — ABNORMAL LOW (ref 3.5–5.0)
Alk. phosphatase: 83 U/L (ref 45–117)
Anion gap: 3 mmol/L — ABNORMAL LOW (ref 5–15)
BUN/Creatinine ratio: 20 (ref 12–20)
BUN: 13 MG/DL (ref 6–20)
Bilirubin, total: 0.6 MG/DL (ref 0.2–1.0)
CO2: 26 mmol/L (ref 21–32)
Calcium: 8.8 MG/DL (ref 8.5–10.1)
Chloride: 103 mmol/L (ref 97–108)
Creatinine: 0.64 MG/DL — ABNORMAL LOW (ref 0.70–1.30)
GFR est AA: >60 mL/min/{1.73_m2} (ref >60)
GFR est non-AA: >60 mL/min/{1.73_m2} (ref >60)
Globulin: 5.2 g/dL — ABNORMAL HIGH (ref 2.0–4.0)
Glucose: 98 mg/dL (ref 65–100)
Potassium: 6.3 mmol/L — ABNORMAL HIGH (ref 3.5–5.1)
Protein, total: 7.9 g/dL (ref 6.4–8.2)
Sodium: 132 mmol/L — ABNORMAL LOW (ref 136–145)

## 2017-11-13 MED ORDER — SODIUM CHLORIDE 0.9% BOLUS IV
0.9 % | Freq: Once | INTRAVENOUS | Status: AC
Start: 2017-11-13 — End: 2017-11-13
  Administered 2017-11-13: 12:00:00 via INTRAVENOUS

## 2017-11-13 MED FILL — SODIUM CHLORIDE 0.9 % IV: INTRAVENOUS | Qty: 1000

## 2017-11-13 NOTE — Progress Notes (Signed)
0800 Pt admit to [facility] for Hydration/Labs ambulatory in stable condition. Assessment completed. No new concerns voiced. Peripheral IV established with positive blood return. Labs drawn per order and sent for processing.    Visit Vitals  BP 111/71   Pulse 100   Temp 98 ??F (36.7 ??C)   Resp 16       Medications:  Normal Saline 1026m    0910 Pt tolerated treatment well. Peripheral IV maintained positive blood return throughout treatment, flushed with positive blood return and removed at conclusion. D/c home ambulatory in no distress. Pt aware of next [facility] appointment scheduled for 11/20/17.    Recent Results (from the past 12 hour(s))   CBC WITH AUTOMATED DIFF    Collection Time: 11/13/17  8:09 AM   Result Value Ref Range    WBC 4.5 4.1 - 11.1 K/uL    RBC 3.28 (L) 4.10 - 5.70 M/uL    HGB 9.3 (L) 12.1 - 17.0 g/dL    HCT 29.6 (L) 36.6 - 50.3 %    MCV 90.2 80.0 - 99.0 FL    MCH 28.4 26.0 - 34.0 PG    MCHC 31.4 30.0 - 36.5 g/dL    RDW 14.4 11.5 - 14.5 %    PLATELET 265 150 - 400 K/uL    MPV 8.9 8.9 - 12.9 FL    NRBC 0.0 0 PER 100 WBC    ABSOLUTE NRBC 0.00 0.00 - 0.01 K/uL    NEUTROPHILS 70 32 - 75 %    LYMPHOCYTES 14 12 - 49 %    MONOCYTES 13 5 - 13 %    EOSINOPHILS 2 0 - 7 %    BASOPHILS 1 0 - 1 %    IMMATURE GRANULOCYTES 0 0.0 - 0.5 %    ABS. NEUTROPHILS 3.2 1.8 - 8.0 K/UL    ABS. LYMPHOCYTES 0.6 (L) 0.8 - 3.5 K/UL    ABS. MONOCYTES 0.6 0.0 - 1.0 K/UL    ABS. EOSINOPHILS 0.1 0.0 - 0.4 K/UL    ABS. BASOPHILS 0.0 0.0 - 0.1 K/UL    ABS. IMM. GRANS. 0.0 0.00 - 0.04 K/UL    DF SMEAR SCANNED      PLATELET COMMENTS Large Platelets      RBC COMMENTS NORMOCYTIC, NORMOCHROMIC     METABOLIC PANEL, COMPREHENSIVE    Collection Time: 11/13/17  8:09 AM   Result Value Ref Range    Sodium 132 (L) 136 - 145 mmol/L    Potassium 6.3 (H) 3.5 - 5.1 mmol/L    Chloride 103 97 - 108 mmol/L    CO2 26 21 - 32 mmol/L    Anion gap 3 (L) 5 - 15 mmol/L    Glucose 98 65 - 100 mg/dL    BUN 13 6 - 20 MG/DL    Creatinine 0.64 (L) 0.70 - 1.30 MG/DL     BUN/Creatinine ratio 20 12 - 20      GFR est AA >60 >60 ml/min/1.760m   GFR est non-AA >60 >60 ml/min/1.7384m  Calcium 8.8 8.5 - 10.1 MG/DL    Bilirubin, total 0.6 0.2 - 1.0 MG/DL    ALT (SGPT) 13 12 - 78 U/L    AST (SGOT) 57 (H) 15 - 37 U/L    Alk. phosphatase 83 45 - 117 U/L    Protein, total 7.9 6.4 - 8.2 g/dL    Albumin 2.7 (L) 3.5 - 5.0 g/dL    Globulin 5.2 (H) 2.0 - 4.0 g/dL  A-G Ratio 0.5 (L) 1.1 - 2.2

## 2017-11-13 NOTE — Progress Notes (Signed)
Orders to [facility] to have BMP drawn today.

## 2017-11-20 ENCOUNTER — Inpatient Hospital Stay: Payer: MEDICAID | Primary: Internal Medicine

## 2017-11-25 ENCOUNTER — Inpatient Hospital Stay: Admit: 2017-11-25 | Payer: MEDICAID | Primary: Internal Medicine

## 2017-11-25 ENCOUNTER — Ambulatory Visit
Admit: 2017-11-25 | Discharge: 2017-11-25 | Payer: PRIVATE HEALTH INSURANCE | Attending: Registered Nurse | Primary: Internal Medicine

## 2017-11-25 DIAGNOSIS — C3431 Malignant neoplasm of lower lobe, right bronchus or lung: Secondary | ICD-10-CM

## 2017-11-25 DIAGNOSIS — C61 Malignant neoplasm of prostate: Secondary | ICD-10-CM

## 2017-11-25 LAB — METABOLIC PANEL, BASIC
Anion gap: 10 mmol/L (ref 5–15)
BUN/Creatinine ratio: 21 — ABNORMAL HIGH (ref 12–20)
BUN: 12 MG/DL (ref 6–20)
CO2: 23 mmol/L (ref 21–32)
Calcium: 9.4 MG/DL (ref 8.5–10.1)
Chloride: 105 mmol/L (ref 97–108)
Creatinine: 0.56 MG/DL — ABNORMAL LOW (ref 0.70–1.30)
GFR est AA: >60 mL/min/{1.73_m2} (ref >60)
GFR est non-AA: >60 mL/min/{1.73_m2} (ref >60)
Glucose: 82 mg/dL (ref 65–100)
Potassium: 3.8 mmol/L (ref 3.5–5.1)
Sodium: 138 mmol/L (ref 136–145)

## 2017-11-25 NOTE — Progress Notes (Signed)
Bruce Clark is a 67 y.o. male    Chief Complaint   Patient presents with   ??? Lung Cancer     4 week follow up     1. Have you been to the ER, urgent care clinic since your last visit?  Hospitalized since your last visit?No    2. Have you seen or consulted any other health care providers outside of the [place] since your last visit?  Include any pap smears or colon screening. No   Health Maintenance Due   Topic Date Due   ??? Hepatitis C Screening  Apr 10, 1951   ??? Pneumococcal 0-64 years (1 of 3 - PCV13) 06/23/1959   ??? DTaP/Tdap/Td series (1 - Tdap) 06/22/1974   ??? Shingrix Vaccine Age 47> (1 of 2) 06/23/2003   ??? FOBT Q 1 YEAR AGE 79-75  06/23/2003

## 2017-11-25 NOTE — Progress Notes (Signed)
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 male who is on Eliquis for a h/o DVT who presented to the Emergency Department on 04/22/16 with nausea, vomiting, and abdominal pain x 2 months, decreased appetite and weight loss of 10-14 lbs.  CTA revealed bilateral lung nodules and mediastinal/hilar adenopathy. PET revealed supraclavicular adenopathy, retroperitoneal adenopathy, bilateral iliac adenopathy, mediastinal/hilar adenopathy, bony metastatic disease, right lower lobe pulmonary mass, and multiple other smaller masses in bilateral lungs. USG guided bx of supraclavicular LN showed adenocarcinoma of the prostate. He was also diagnosed and treated for CAP. PSA on 05/26/16-2400. Was discussed in tumor board, Napsin A negative and a small cell component ruled out. Initiated Degarelix  05/26/16. MRI spine with expansile C7 lesion, T12 lesion encroaching epidural space, S1-S2 lesion encroaching neural foramina. Started palliative C6-T5 and T11-L5 XRT under Dr. Leron Croak on 06/09/16. Started Zytiga 08/28/16 with evidence of response. Noted to have a  Left lower lobe lung nodule on CT done 12/22/16. Biopsy consistent with Lung adenocarcinoma. He had a mediastinoscopy with LN being negative for metastatic disease. Robotic RLL lobectomy performed on 05/22/17.     He underwent a robotic right lower lobectomy on 05/22/17 with Dr. Glo Herring. Pathology showed adenocarcinoma T3N2 stage IIIB. Case discussed at tumor board and there is no plan for adjuvant chemotherapy at this time. He completed post op radiation with Dr. Leron Croak on 09/18/17.     Comes in today for follow-up. Had a bone scan. Completed course of antibiotics and feels much better. Cough still continues. He continues to stay active. He reports DOE which is stable. He denies fevers/chills.  Right sided chest pain has improved and he no longer is requiring pain medication. He is eating and drinking well without nausea/vomiting. Continues to take Prednisone and Zytiga daily. Continues to stay active and walk daily. Denies headaches, dizziness, vision changes, or recent falls. No bleeding.     Otherwise, complete ROS is per the symptom report form which has been scanned into the media section of the electronic medical record.    Past Medical History:   Diagnosis Date   ??? Lung cancer ([place])    ??? Prostate cancer ([place])    ??? Stroke ([place])     PT STATES, "I HAD A STROKE A LONG TIME AGO"       Past Surgical History:   Procedure Laterality Date   ??? CHEST SURGERY PROCEDURE UNLISTED  04/27/2017    BRONCHOSCOPY/MEDIASTINOSCOPY   ??? HX GI      HERNIA REPAIR   ??? HX ORTHOPAEDIC  1994    ROD IN RIGHT LEG       No Known Allergies    Current Outpatient Medications   Medication Sig Dispense Refill   ??? amoxicillin-clavulanate (AUGMENTIN) 875-125 mg per tablet Take 1 Tab by mouth every twelve (12) hours. 14 Tab 0   ??? apixaban (ELIQUIS) 5 mg tablet Take 1 Tab by mouth two (2) times a day. 14 Tab 0   ??? abiraterone (ZYTIGA) 500 mg tab Take 1,000 mg by mouth daily. 60 Tab 3   ??? morphine 10 mg/5 mL oral solution Take 2.5 mL by mouth every four (4) hours as needed for Pain. Max Daily Amount: 30 mg. 500 mL 0   ??? polyethylene glycol (MIRALAX) 17 gram packet Take 1 Packet  by mouth daily. 30 Packet 2   ??? magic mouthwash solution Magic mouth wash   Maalox  Lidocaine 2% viscous   Diphenhydramine oral solution   Nystatin 100,000 units    Pharmacy to mix equal portions of ingredients to a total volume as indicated in the dispense amount.  Swish and spit 61m (1 tsp) four times daily as needed for mouth sores 480 mL 3   ??? predniSONE (DELTASONE) 5 mg tablet Take 1 Tab by mouth daily. Restart on Jun 01, 2017 30 Tab 3   ??? senna (SENOKOT) 8.6 mg tablet Take 1 Tab by mouth nightly. Indications: constipation 30 Tab 0       Social History      Socioeconomic History   ??? Marital status: SINGLE     Spouse name: Not on file   ??? Number of children: Not on file   ??? Years of education: Not on file   ??? Highest education level: Not on file   Tobacco Use   ??? Smoking status: Former Smoker     Packs/day: 0.50     Years: 47.00     Pack years: 23.50     Last attempt to quit: 06/2016     Years since quitting: 1.4   ??? Smokeless tobacco: Never Used   Substance and Sexual Activity   ??? Alcohol use: No   ??? Drug use: No       Family History   Problem Relation Age of Onset   ??? Cancer Mother         COLON   ??? Cancer Father         BRAIN TUMOR   ??? No Known Problems Sister    ??? Arthritis-osteo Brother    ??? No Known Problems Sister    ??? No Known Problems Sister    ??? No Known Problems Sister    ??? No Known Problems Brother    ??? Anesth Problems Neg Hx      ROS  A review of systems was obtained and is negative except as listed in HPI.  ECOG PS is 1    Physical Examination:   Visit Vitals  BP 121/80 (BP 1 Location: Left arm, BP Patient Position: Sitting)   Pulse 82   Temp 98.5 ??F (36.9 ??C) (Oral)   Resp 17   Ht 5' 7" (1.702 m)   Wt 132 lb (59.9 kg)   SpO2 93%   BMI 20.67 kg/m??     General appearance - alert, and in no distress  Mental status - oriented to person, place, and time  Mouth - mucous membranes moist, no lesions noted  Neck - supple, no significant adenopathy  Resp-CTA, diminished, normal respiratory effort  CV-s1s2, no LE edema  Neurological - normal speech, no focal findings or movement disorder noted  Musculoskeletal - no joint tenderness, deformity or swelling  Extremities - peripheral pulses normal, no pedal edema, no clubbing or cyanosis  Skin - robotic incision site and previous chest tube site on right side of chest appear to be healing appropriately, TTP    LABS  Lab Results   Component Value Date/Time    WBC 4.5 11/13/2017 08:09 AM    HGB 9.3 (L) 11/13/2017 08:09 AM    HCT 29.6 (L) 11/13/2017 08:09 AM    PLATELET 265 11/13/2017 08:09 AM     MCV 90.2 11/13/2017 08:09 AM    ABS. NEUTROPHILS 3.2 11/13/2017 08:09 AM     Lab Results  Component Value Date/Time    Sodium 132 (L) 11/13/2017 08:09 AM    Potassium 6.3 (H) 11/13/2017 08:09 AM    Chloride 103 11/13/2017 08:09 AM    CO2 26 11/13/2017 08:09 AM    Glucose 98 11/13/2017 08:09 AM    BUN 13 11/13/2017 08:09 AM    Creatinine 0.64 (L) 11/13/2017 08:09 AM    GFR est AA >60 11/13/2017 08:09 AM    GFR est non-AA >60 11/13/2017 08:09 AM    Calcium 8.8 11/13/2017 08:09 AM     Lab Results   Component Value Date/Time    AST (SGOT) 57 (H) 11/13/2017 08:09 AM    Alk. phosphatase 83 11/13/2017 08:09 AM    Protein, total 7.9 11/13/2017 08:09 AM    Albumin 2.7 (L) 11/13/2017 08:09 AM    Globulin 5.2 (H) 11/13/2017 08:09 AM    A-G Ratio 0.5 (L) 11/13/2017 08:09 AM     Recent Labs     10/16/17  0759 10/06/17  1037 09/15/17  0909 09/01/17  0839 08/25/17  0959 07/13/17  0753 05/06/17  0923 03/23/17  0729   PSA 0.1 0.1 0.1 0.1 0.1 0.1  --   --    PSALT  --   --   --   --   --   --  <0.1 0.3       IMAGING  PET CT  03/23/17  IMPRESSION: Mass lesion right lower lobe is hypermetabolic and compatible with  the biopsy confirmed the result of adenocarcinoma. There are soft tissue  densities in the anterior abdominal wall bilaterally which are stable compared  to the prior chest abdomen pelvis CT but new compared to the prior PET/CT.  Please correlate with any recent abdominal wall surgery as these may represent  port sites. Otherwise normal tracer distribution. Stable sclerotic osseous  metastatic disease without abnormal activity.    PATHOLOGY  Supraclavicular node   CYTOLOGIC INTERPRETATION:   Adenocarcinoma, consistent with prostate primary   See comment   General Categorization   Positive for malignancy.   Specimen Adequacy   Satisfactory for evaluation.   Comment   Touch preps and a core biopsy are examined and show islands of adenocarcinoma surrounded by fibrous stroma. A panel of  immunohistochemical stains was performed to evaluate site of origin. The tumor cells are focally positive for PSA and PSAP and negative for CK7, CK20, TTF-1, CDX-2 and Napsin A. Morphology and immunoprofile are most consistent with a metastatic prostatic adenocarcinoma.    Pathology from CT guided biopsy of R lung mass  Lung adenocarcinoma    Pathology from RLL lobectomy on 05/22/17:  1. Soft tissue, chest wall margin, biopsy:   Fragments of soft tissue involved by adenocarcinoma   2. Lymph node, station 7, excision:   One lymph node, positive for metastatic adenocarcinoma (1/1)   See comment   3. Lung, right lower lobe, lobectomy:   Adenocarcinoma, 6.7 cm, with involvement of parietal pleura   Four hilar lymph nodes, negative for metastatic carcinoma (0/4)     Bone scan 07/13/17:  IMPRESSION: Metastatic disease thoracic and lumbar spine.    CT neck/chest/abd/pelvis 07/13/17:  IMPRESSION:  1. CT of the neck demonstrates no adenopathy. The supraglottic airway is close  related to technique.. There is mild asymmetry in the true vocal cords right is  more prominent than the left and correlation would be helpful.  2. CT of the chest demonstrates right lower lobe lobectomy changes. There is  pleural thickening in  the right mid and lower thorax posteriorly most likely  postoperative. There is also slight soft tissue prominence at the postoperative  site in the right hilum again most likely postsurgical but close follow-up would  be helpful.  3. CT of the abdomen and pelvis demonstrates no adenopathy or mass. Slight  nodularity in the prostate gland is stable.  4. Bony metastatic disease to the spine is stable in appearance.    CT chest/abd/pelvis 10/10/17:  IMPRESSION:  1. 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 Clinical correlation is recommended.  2. Status post right lobectomy with postsurgical changes adjacent to the right  hilum and in the right pleura.   3. Stable osseous metastatic disease in the thoracic spine.    Bone scan 11/04/17:  IMPRESSION: Stable pattern of osseous metastatic disease.    ASSESSMENT  Bruce Clark is a 67 y.o. male with widely metastatic prostate adenocarcinoma and newly diagnosed lung adenocarcinoma status post right lower lobe lobectomy on 05/22/17 and presents today for follow-up.     PLAN    Prostate cancer  Castrate sensitive, treatment na??ve widely metastatic prostate cancer to lymph nodes above and below the diaphragm and bones.  Prostate adenocarcinoma diagnosed on biopsy of the supraclavicular lymph node. Discussed in tumor board and a small cell component has been ruled out.  PSA at the time of diagnosis at 2400.    Currently on Lupron 45 mg every 6 months. On Zytiga 1,036m daily + prednisone 5 mg daily per LATTITUDE. Off Zytiga 09/30/17-10/21/17 due to medication being stollen but has since resumed it   He has continued to have a radiologic and PSA response  CBC, CMP, PSA monthly in [facility]  Lupron q6 months    R lung adenocarcinoma, T3N2MX stage IIIB- Parietal pleural involvement- PD-L1 99%  S/p RLL lobectomy on 10/5  Very high risk of recurrence  Case was discussed in tumor board and the consensus was to offer PORT with N2 disease  PORT with Dr. PLeron CroakCompleted  09/18/17.     He has grade 2 fatigue from RT and anorexia. Will continue weekly hydration for now as he says this helps his symptoms.     As regards adjuvant chemotherapy- we elected to not pursue this with another life limiting illness. We decided to consider immunotherapy with any evidence of recurrence.    Repeat imaging shows no recurrence of lung cancer as of 2/19.   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 Will continue to monitor. No signs of obvious bleeding.    Cough/URI  CXR shows R hilar fullness with infiltrate   Augmentin BID x 7 days completed  Benzonatate 248m TID PRN for cough  Symptoms improved today    Hyperkalemia  6.1 on last CMP- repeat normal    Follow-up in 1 month with CT c/a/p    RJuliene Pina MD

## 2017-11-25 NOTE — Progress Notes (Signed)
[facility] Lab Visit:      6269 Pt. Admit to [facility] ambulatory and in stable contditon. No new concerns voiced. Lab drawn peripherally from Right AC arm and sent for processing.    Visit Vitals  BP 121/81 (BP 1 Location: Right arm, BP Patient Position: Sitting)   Pulse 76   Temp 98.1 ??F (36.7 ??C)   Resp 18   SpO2 100%     1030 Pt. Tolerated well. D/c home from [facility] ambulatory in no distress. Pt aware of next [facility] appointment scheduled.      Labs available in CC once resulted.

## 2017-11-27 ENCOUNTER — Inpatient Hospital Stay: Admit: 2017-11-27 | Payer: MEDICAID | Primary: Internal Medicine

## 2017-11-27 DIAGNOSIS — C3431 Malignant neoplasm of lower lobe, right bronchus or lung: Secondary | ICD-10-CM

## 2017-11-27 LAB — CBC WITH AUTOMATED DIFF
ABS. BASOPHILS: 0 10*3/uL (ref 0.0–0.1)
ABS. EOSINOPHILS: 0.2 10*3/uL (ref 0.0–0.4)
ABS. IMM. GRANS.: 0 10*3/uL (ref 0.00–0.04)
ABS. LYMPHOCYTES: 0.6 10*3/uL — ABNORMAL LOW (ref 0.8–3.5)
ABS. MONOCYTES: 0.6 10*3/uL (ref 0.0–1.0)
ABS. NEUTROPHILS: 2.8 10*3/uL (ref 1.8–8.0)
ABSOLUTE NRBC: 0 10*3/uL (ref 0.00–0.01)
BASOPHILS: 1 % (ref 0–1)
EOSINOPHILS: 4 % (ref 0–7)
HCT: 29.6 % — ABNORMAL LOW (ref 36.6–50.3)
HGB: 9.4 g/dL — ABNORMAL LOW (ref 12.1–17.0)
IMMATURE GRANULOCYTES: 1 % — ABNORMAL HIGH (ref 0.0–0.5)
LYMPHOCYTES: 14 % (ref 12–49)
MCH: 28.3 PG (ref 26.0–34.0)
MCHC: 31.8 g/dL (ref 30.0–36.5)
MCV: 89.2 FL (ref 80.0–99.0)
MONOCYTES: 14 % — ABNORMAL HIGH (ref 5–13)
MPV: 8.6 FL — ABNORMAL LOW (ref 8.9–12.9)
NEUTROPHILS: 66 % (ref 32–75)
NRBC: 0 PER 100 WBC
PLATELET: 259 10*3/uL (ref 150–400)
RBC: 3.32 M/uL — ABNORMAL LOW (ref 4.10–5.70)
RDW: 14.4 % (ref 11.5–14.5)
WBC: 4.2 10*3/uL (ref 4.1–11.1)

## 2017-11-27 LAB — METABOLIC PANEL, COMPREHENSIVE
A-G Ratio: 0.6 — ABNORMAL LOW (ref 1.1–2.2)
ALT (SGPT): 10 U/L — ABNORMAL LOW (ref 12–78)
AST (SGOT): 15 U/L (ref 15–37)
Albumin: 2.9 g/dL — ABNORMAL LOW (ref 3.5–5.0)
Alk. phosphatase: 81 U/L (ref 45–117)
Anion gap: 4 mmol/L — ABNORMAL LOW (ref 5–15)
BUN/Creatinine ratio: 25 — ABNORMAL HIGH (ref 12–20)
BUN: 14 MG/DL (ref 6–20)
Bilirubin, total: 0.3 MG/DL (ref 0.2–1.0)
CO2: 29 mmol/L (ref 21–32)
Calcium: 9.5 MG/DL (ref 8.5–10.1)
Chloride: 105 mmol/L (ref 97–108)
Creatinine: 0.57 MG/DL — ABNORMAL LOW (ref 0.70–1.30)
GFR est AA: >60 mL/min/{1.73_m2} (ref >60)
GFR est non-AA: >60 mL/min/{1.73_m2} (ref >60)
Globulin: 4.5 g/dL — ABNORMAL HIGH (ref 2.0–4.0)
Glucose: 79 mg/dL (ref 65–100)
Potassium: 3.6 mmol/L (ref 3.5–5.1)
Protein, total: 7.4 g/dL (ref 6.4–8.2)
Sodium: 138 mmol/L (ref 136–145)

## 2017-11-27 LAB — PSA, DIAGNOSTIC (PROSTATE SPECIFIC AG): Prostate Specific Ag: 0.1 ng/mL (ref 0.01–4.0)

## 2017-11-27 MED ORDER — SODIUM CHLORIDE 0.9% BOLUS IV
0.9 % | Freq: Once | INTRAVENOUS | Status: AC
Start: 2017-11-27 — End: 2017-11-27
  Administered 2017-11-27: 12:00:00 via INTRAVENOUS

## 2017-11-27 MED FILL — SODIUM CHLORIDE 0.9 % IV: INTRAVENOUS | Qty: 1000

## 2017-11-27 NOTE — Progress Notes (Signed)
Outpatient Infusion Center - Chemotherapy Progress Note    0800 Pt admit to [facility] for Hydration ambulatory in stable condition. Assessment completed. No new concerns voiced. PIV access established in L arm with positive blood return. Labs drawn per order and sent.  Line flushed, hydration infusing.     Visit Vitals  BP (P) 110/71   Pulse (P) 91   Temp 99.1 ??F (37.3 ??C)   Resp 18       Medications:  NS 1L/hour    0910 Pt tolerated treatment well. PIV infiltrated w/ 10 mins remaining; pt refused remaining hydration.  PIV removed at discharge.  D/c home ambulatory in no distress. Pt aware of next [facility] appointment scheduled for 12/04/2017.    Recent Results (from the past 12 hour(s))   CBC WITH AUTOMATED DIFF    Collection Time: 11/27/17  8:06 AM   Result Value Ref Range    WBC 4.2 4.1 - 11.1 K/uL    RBC 3.32 (L) 4.10 - 5.70 M/uL    HGB 9.4 (L) 12.1 - 17.0 g/dL    HCT 29.6 (L) 36.6 - 50.3 %    MCV 89.2 80.0 - 99.0 FL    MCH 28.3 26.0 - 34.0 PG    MCHC 31.8 30.0 - 36.5 g/dL    RDW 14.4 11.5 - 14.5 %    PLATELET 259 150 - 400 K/uL    MPV 8.6 (L) 8.9 - 12.9 FL    NRBC 0.0 0 PER 100 WBC    ABSOLUTE NRBC 0.00 0.00 - 0.01 K/uL    NEUTROPHILS 66 32 - 75 %    LYMPHOCYTES 14 12 - 49 %    MONOCYTES 14 (H) 5 - 13 %    EOSINOPHILS 4 0 - 7 %    BASOPHILS 1 0 - 1 %    IMMATURE GRANULOCYTES 1 (H) 0.0 - 0.5 %    ABS. NEUTROPHILS 2.8 1.8 - 8.0 K/UL    ABS. LYMPHOCYTES 0.6 (L) 0.8 - 3.5 K/UL    ABS. MONOCYTES 0.6 0.0 - 1.0 K/UL    ABS. EOSINOPHILS 0.2 0.0 - 0.4 K/UL    ABS. BASOPHILS 0.0 0.0 - 0.1 K/UL    ABS. IMM. GRANS. 0.0 0.00 - 0.04 K/UL    DF SMEAR SCANNED      RBC COMMENTS NORMOCYTIC, NORMOCHROMIC     METABOLIC PANEL, COMPREHENSIVE    Collection Time: 11/27/17  8:06 AM   Result Value Ref Range    Sodium 138 136 - 145 mmol/L    Potassium 3.6 3.5 - 5.1 mmol/L    Chloride 105 97 - 108 mmol/L    CO2 29 21 - 32 mmol/L    Anion gap 4 (L) 5 - 15 mmol/L    Glucose 79 65 - 100 mg/dL    BUN 14 6 - 20 MG/DL     Creatinine 0.57 (L) 0.70 - 1.30 MG/DL    BUN/Creatinine ratio 25 (H) 12 - 20      GFR est AA >60 >60 ml/min/1.48m    GFR est non-AA >60 >60 ml/min/1.745m   Calcium 9.5 8.5 - 10.1 MG/DL    Bilirubin, total 0.3 0.2 - 1.0 MG/DL    ALT (SGPT) 10 (L) 12 - 78 U/L    AST (SGOT) 15 15 - 37 U/L    Alk. phosphatase 81 45 - 117 U/L    Protein, total 7.4 6.4 - 8.2 g/dL    Albumin 2.9 (L) 3.5 - 5.0 g/dL  Globulin 4.5 (H) 2.0 - 4.0 g/dL    A-G Ratio 0.6 (L) 1.1 - 2.2     PSA, DIAGNOSTIC (PROSTATE SPECIFIC AG)    Collection Time: 11/27/17  8:06 AM   Result Value Ref Range    Prostate Specific Ag 0.1 0.01 - 4.0 ng/mL

## 2017-12-01 ENCOUNTER — Encounter: Attending: Family | Primary: Internal Medicine

## 2017-12-04 NOTE — Progress Notes (Signed)
Pt did not show for today's [facility] appointment.

## 2017-12-05 ENCOUNTER — Inpatient Hospital Stay: Payer: MEDICAID | Primary: Internal Medicine

## 2017-12-07 ENCOUNTER — Encounter: Attending: Medical-Surgical | Primary: Internal Medicine

## 2017-12-11 ENCOUNTER — Inpatient Hospital Stay: Admit: 2017-12-11 | Payer: MEDICAID | Primary: Internal Medicine

## 2017-12-11 LAB — CBC WITH AUTOMATED DIFF
ABS. BASOPHILS: 0 10*3/uL (ref 0.0–0.1)
ABS. EOSINOPHILS: 0.1 10*3/uL (ref 0.0–0.4)
ABS. IMM. GRANS.: 0 10*3/uL (ref 0.00–0.04)
ABS. LYMPHOCYTES: 0.7 10*3/uL — ABNORMAL LOW (ref 0.8–3.5)
ABS. MONOCYTES: 0.5 10*3/uL (ref 0.0–1.0)
ABS. NEUTROPHILS: 2.6 10*3/uL (ref 1.8–8.0)
ABSOLUTE NRBC: 0 10*3/uL (ref 0.00–0.01)
BASOPHILS: 0 % (ref 0–1)
EOSINOPHILS: 3 % (ref 0–7)
HCT: 29.4 % — ABNORMAL LOW (ref 36.6–50.3)
HGB: 9 g/dL — ABNORMAL LOW (ref 12.1–17.0)
IMMATURE GRANULOCYTES: 0 % (ref 0.0–0.5)
LYMPHOCYTES: 18 % (ref 12–49)
MCH: 27.2 PG (ref 26.0–34.0)
MCHC: 30.6 g/dL (ref 30.0–36.5)
MCV: 88.8 FL (ref 80.0–99.0)
MONOCYTES: 14 % — ABNORMAL HIGH (ref 5–13)
MPV: 9.3 FL (ref 8.9–12.9)
NEUTROPHILS: 65 % (ref 32–75)
NRBC: 0 PER 100 WBC
PLATELET: 240 10*3/uL (ref 150–400)
RBC: 3.31 M/uL — ABNORMAL LOW (ref 4.10–5.70)
RDW: 14.5 % (ref 11.5–14.5)
WBC: 3.9 10*3/uL — ABNORMAL LOW (ref 4.1–11.1)

## 2017-12-11 LAB — METABOLIC PANEL, COMPREHENSIVE
A-G Ratio: 0.6 — ABNORMAL LOW (ref 1.1–2.2)
ALT (SGPT): 10 U/L — ABNORMAL LOW (ref 12–78)
AST (SGOT): 16 U/L (ref 15–37)
Albumin: 2.9 g/dL — ABNORMAL LOW (ref 3.5–5.0)
Alk. phosphatase: 78 U/L (ref 45–117)
Anion gap: 7 mmol/L (ref 5–15)
BUN/Creatinine ratio: 15 (ref 12–20)
BUN: 9 MG/DL (ref 6–20)
Bilirubin, total: 0.3 MG/DL (ref 0.2–1.0)
CO2: 24 mmol/L (ref 21–32)
Calcium: 9.2 MG/DL (ref 8.5–10.1)
Chloride: 105 mmol/L (ref 97–108)
Creatinine: 0.6 MG/DL — ABNORMAL LOW (ref 0.70–1.30)
GFR est AA: >60 mL/min/{1.73_m2} (ref >60)
GFR est non-AA: >60 mL/min/{1.73_m2} (ref >60)
Globulin: 5.1 g/dL — ABNORMAL HIGH (ref 2.0–4.0)
Glucose: 72 mg/dL (ref 65–100)
Potassium: 4 mmol/L (ref 3.5–5.1)
Protein, total: 8 g/dL (ref 6.4–8.2)
Sodium: 136 mmol/L (ref 136–145)

## 2017-12-11 MED ORDER — SODIUM CHLORIDE 0.9% BOLUS IV
0.9 % | Freq: Once | INTRAVENOUS | Status: AC
Start: 2017-12-11 — End: 2017-12-11
  Administered 2017-12-11: 14:00:00 via INTRAVENOUS

## 2017-12-11 MED ORDER — SODIUM CHLORIDE 0.9 % IJ SYRG
INTRAMUSCULAR | Status: DC | PRN
Start: 2017-12-11 — End: 2017-12-12
  Administered 2017-12-11: 14:00:00 via INTRAVENOUS

## 2017-12-11 MED FILL — SODIUM CHLORIDE 0.9 % IV: INTRAVENOUS | Qty: 1000

## 2017-12-11 NOTE — Progress Notes (Signed)
Outpatient Infusion Center Progress Note    1010 Pt admit to [facility] for hydration/labs. Pt ambulatory in stable condition. Assessment completed. No new concerns voiced. Peripheral IV established with positive blood return. Labs drawn and sent for processing. Line connected to NS for hydration.   Recent Results (from the past 12 hour(s))   CBC WITH AUTOMATED DIFF    Collection Time: 12/11/17 10:20 AM   Result Value Ref Range    WBC 3.9 (L) 4.1 - 11.1 K/uL    RBC 3.31 (L) 4.10 - 5.70 M/uL    HGB 9.0 (L) 12.1 - 17.0 g/dL    HCT 29.4 (L) 36.6 - 50.3 %    MCV 88.8 80.0 - 99.0 FL    MCH 27.2 26.0 - 34.0 PG    MCHC 30.6 30.0 - 36.5 g/dL    RDW 14.5 11.5 - 14.5 %    PLATELET 240 150 - 400 K/uL    MPV 9.3 8.9 - 12.9 FL    NRBC 0.0 0 PER 100 WBC    ABSOLUTE NRBC 0.00 0.00 - 0.01 K/uL    NEUTROPHILS 65 32 - 75 %    LYMPHOCYTES 18 12 - 49 %    MONOCYTES 14 (H) 5 - 13 %    EOSINOPHILS 3 0 - 7 %    BASOPHILS 0 0 - 1 %    IMMATURE GRANULOCYTES 0 0.0 - 0.5 %    ABS. NEUTROPHILS 2.6 1.8 - 8.0 K/UL    ABS. LYMPHOCYTES 0.7 (L) 0.8 - 3.5 K/UL    ABS. MONOCYTES 0.5 0.0 - 1.0 K/UL    ABS. EOSINOPHILS 0.1 0.0 - 0.4 K/UL    ABS. BASOPHILS 0.0 0.0 - 0.1 K/UL    ABS. IMM. GRANS. 0.0 0.00 - 0.04 K/UL    DF SMEAR SCANNED      RBC COMMENTS ANISOCYTOSIS  1+       METABOLIC PANEL, COMPREHENSIVE    Collection Time: 12/11/17 10:20 AM   Result Value Ref Range    Sodium 136 136 - 145 mmol/L    Potassium 4.0 3.5 - 5.1 mmol/L    Chloride 105 97 - 108 mmol/L    CO2 24 21 - 32 mmol/L    Anion gap 7 5 - 15 mmol/L    Glucose 72 65 - 100 mg/dL    BUN 9 6 - 20 MG/DL    Creatinine 0.60 (L) 0.70 - 1.30 MG/DL    BUN/Creatinine ratio 15 12 - 20      GFR est AA >60 >60 ml/min/1.40m    GFR est non-AA >60 >60 ml/min/1.733m   Calcium 9.2 8.5 - 10.1 MG/DL    Bilirubin, total 0.3 0.2 - 1.0 MG/DL    ALT (SGPT) 10 (L) 12 - 78 U/L    AST (SGOT) 16 15 - 37 U/L    Alk. phosphatase 78 45 - 117 U/L    Protein, total 8.0 6.4 - 8.2 g/dL    Albumin 2.9 (L) 3.5 - 5.0 g/dL     Globulin 5.1 (H) 2.0 - 4.0 g/dL    A-G Ratio 0.6 (L) 1.1 - 2.2       Visit Vitals  BP 132/84 (BP 1 Location: Right arm, BP Patient Position: At rest)   Pulse 91   Temp 97.8 ??F (36.6 ??C)   Resp 18     Medications:  NS 1L over 1 HR    1125 Pt tolerated treatment well. PIV maintained positive blod return throughout treatment.  Line flushed and removed. D/c home ambulatory in no distress. Pt aware of next appointment scheduled for 12/28/17 at 0800.

## 2017-12-28 ENCOUNTER — Encounter: Payer: MEDICAID | Primary: Internal Medicine

## 2017-12-28 NOTE — Progress Notes (Signed)
CT scans not scheduled. Has f/u apt with Dr. Melony Overly on Friday for scan review. Aleta/Shelia, could scans be done before Friday?

## 2017-12-28 NOTE — Progress Notes (Addendum)
Spoke to Pt's friend [name], Hippa verified, phone number given to call and schedule scans, she reports she will call now to schedule.

## 2017-12-31 ENCOUNTER — Ambulatory Visit: Payer: MEDICAID | Primary: Internal Medicine

## 2018-01-01 ENCOUNTER — Encounter: Attending: Internal Medicine | Primary: Internal Medicine

## 2018-01-01 ENCOUNTER — Inpatient Hospital Stay: Admit: 2018-01-01 | Payer: MEDICAID | Primary: Internal Medicine

## 2018-01-01 ENCOUNTER — Encounter: Payer: MEDICAID | Primary: Internal Medicine

## 2018-01-01 DIAGNOSIS — C61 Malignant neoplasm of prostate: Secondary | ICD-10-CM

## 2018-01-01 LAB — CBC WITH AUTOMATED DIFF
ABS. BASOPHILS: 0 10*3/uL (ref 0.0–0.1)
ABS. EOSINOPHILS: 0 10*3/uL (ref 0.0–0.4)
ABS. IMM. GRANS.: 0 10*3/uL
ABS. LYMPHOCYTES: 0.5 10*3/uL — ABNORMAL LOW (ref 0.8–3.5)
ABS. MONOCYTES: 0.6 10*3/uL (ref 0.0–1.0)
ABS. NEUTROPHILS: 3.5 10*3/uL (ref 1.8–8.0)
ABSOLUTE NRBC: 0 10*3/uL (ref 0.00–0.01)
BAND NEUTROPHILS: 1 % (ref 0–6)
BASOPHILS: 1 % (ref 0–1)
EOSINOPHILS: 1 % (ref 0–7)
HCT: 34.6 % — ABNORMAL LOW (ref 36.6–50.3)
HGB: 10.7 g/dL — ABNORMAL LOW (ref 12.1–17.0)
IMMATURE GRANULOCYTES: 0 %
LYMPHOCYTES: 10 % — ABNORMAL LOW (ref 12–49)
MCH: 27.1 PG (ref 26.0–34.0)
MCHC: 30.9 g/dL (ref 30.0–36.5)
MCV: 87.6 FL (ref 80.0–99.0)
METAMYELOCYTES: 1 % — ABNORMAL HIGH
MONOCYTES: 12 % (ref 5–13)
MPV: 9 FL (ref 8.9–12.9)
NEUTROPHILS: 74 % (ref 32–75)
NRBC: 0 PER 100 WBC
PLATELET: 189 10*3/uL (ref 150–400)
RBC: 3.95 M/uL — ABNORMAL LOW (ref 4.10–5.70)
RDW: 14.2 % (ref 11.5–14.5)
WBC: 4.7 10*3/uL (ref 4.1–11.1)

## 2018-01-01 LAB — METABOLIC PANEL, COMPREHENSIVE
A-G Ratio: 0.7 — ABNORMAL LOW (ref 1.1–2.2)
ALT (SGPT): 9 U/L — ABNORMAL LOW (ref 12–78)
AST (SGOT): 11 U/L — ABNORMAL LOW (ref 15–37)
Albumin: 3 g/dL — ABNORMAL LOW (ref 3.5–5.0)
Alk. phosphatase: 84 U/L (ref 45–117)
Anion gap: 8 mmol/L (ref 5–15)
BUN/Creatinine ratio: 14 (ref 12–20)
BUN: 8 MG/DL (ref 6–20)
Bilirubin, total: 0.3 MG/DL (ref 0.2–1.0)
CO2: 28 mmol/L (ref 21–32)
Calcium: 9 MG/DL (ref 8.5–10.1)
Chloride: 102 mmol/L (ref 97–108)
Creatinine: 0.57 MG/DL — ABNORMAL LOW (ref 0.70–1.30)
GFR est AA: >60 mL/min/{1.73_m2} (ref >60)
GFR est non-AA: >60 mL/min/{1.73_m2} (ref >60)
Globulin: 4.2 g/dL — ABNORMAL HIGH (ref 2.0–4.0)
Glucose: 84 mg/dL (ref 65–100)
Potassium: 3.6 mmol/L (ref 3.5–5.1)
Protein, total: 7.2 g/dL (ref 6.4–8.2)
Sodium: 138 mmol/L (ref 136–145)

## 2018-01-01 LAB — PSA, DIAGNOSTIC (PROSTATE SPECIFIC AG): Prostate Specific Ag: 0.1 ng/mL (ref 0.01–4.0)

## 2018-01-01 MED ORDER — LEUPROLIDE (6 MONTH) 45 MG IM SYRINGE KIT
45 mg | Freq: Once | INTRAMUSCULAR | Status: AC
Start: 2018-01-01 — End: 2018-01-01
  Administered 2018-01-01: 13:00:00 via INTRAMUSCULAR

## 2018-01-01 MED FILL — LUPRON DEPOT 45 MG (6 MONTH) INTRAMUSCULAR SYRINGE KIT: 45 mg | INTRAMUSCULAR | Qty: 1

## 2018-01-01 NOTE — Progress Notes (Signed)
Pt tolerated his Lupron IM injection well.

## 2018-01-01 NOTE — Progress Notes (Signed)
[facility] [place] VISIT NOTE     [phone] Patient arrives for Lupron q 6 months injection & lab draws without acute problems. Please see connect care for complete assessment and education provided.      Vital signs stable throughout and prior to discharge, Pt. Tolerated treatment well and discharged without incident. Patient is aware of next [facility] appointment on 06/14/2018 @ 8:30 AM. Appointment card given to patient.     Medications Verified by via Micromedex:  1. Lupron 45 mg IM via Right Gluteus Maximus     VITAL SIGNS   Patient Vitals for the past 12 hrs:   Temp Pulse Resp BP   01/01/18 0808 98.6 ??F (37 ??C) 97 18 114/72          LAB WORK  Labs Pending, please see connect care for results.   Recent Results (from the past 12 hour(s))   CBC WITH AUTOMATED DIFF    Collection Time: 01/01/18  8:11 AM   Result Value Ref Range    WBC 4.7 4.1 - 11.1 K/uL    RBC 3.95 (L) 4.10 - 5.70 M/uL    HGB 10.7 (L) 12.1 - 17.0 g/dL    HCT 34.6 (L) 36.6 - 50.3 %    MCV 87.6 80.0 - 99.0 FL    MCH 27.1 26.0 - 34.0 PG    MCHC 30.9 30.0 - 36.5 g/dL    RDW 14.2 11.5 - 14.5 %    PLATELET 189 150 - 400 K/uL    MPV 9.0 8.9 - 12.9 FL    NRBC 0.0 0 PER 100 WBC    ABSOLUTE NRBC 0.00 0.00 - 0.01 K/uL    NEUTROPHILS PENDING %    LYMPHOCYTES PENDING %    MONOCYTES PENDING %    EOSINOPHILS PENDING %    BASOPHILS PENDING %    IMMATURE GRANULOCYTES PENDING %    ABS. NEUTROPHILS PENDING K/UL    ABS. LYMPHOCYTES PENDING K/UL    ABS. MONOCYTES PENDING K/UL    ABS. EOSINOPHILS PENDING K/UL    ABS. BASOPHILS PENDING K/UL    ABS. IMM. GRANS. PENDING K/UL    DF PENDING

## 2018-01-04 ENCOUNTER — Inpatient Hospital Stay: Admit: 2018-01-04 | Payer: MEDICAID | Attending: Registered Nurse | Primary: Internal Medicine

## 2018-01-04 DIAGNOSIS — C3431 Malignant neoplasm of lower lobe, right bronchus or lung: Secondary | ICD-10-CM

## 2018-01-04 MED ORDER — IOHEXOL 240 MG/ML IV SOLN
240 mg iodine/mL | Freq: Once | INTRAVENOUS | Status: AC
Start: 2018-01-04 — End: 2018-01-04
  Administered 2018-01-04: 18:00:00 via ORAL

## 2018-01-04 MED ORDER — IOPAMIDOL 76 % IV SOLN
370 mg iodine /mL (76 %) | Freq: Once | INTRAVENOUS | Status: AC
Start: 2018-01-04 — End: 2018-01-04
  Administered 2018-01-04: 18:00:00 via INTRAVENOUS

## 2018-01-04 MED ORDER — SODIUM CHLORIDE 0.9% BOLUS IV
0.9 % | Freq: Once | INTRAVENOUS | Status: AC
Start: 2018-01-04 — End: 2018-01-04
  Administered 2018-01-04: 18:00:00 via INTRAVENOUS

## 2018-01-04 MED ORDER — SODIUM CHLORIDE 0.9 % IJ SYRG
Freq: Once | INTRAMUSCULAR | Status: AC
Start: 2018-01-04 — End: 2018-01-04
  Administered 2018-01-04: 18:00:00 via INTRAVENOUS

## 2018-01-04 MED FILL — NORMAL SALINE FLUSH 0.9 % INJECTION SYRINGE: INTRAMUSCULAR | Qty: 10

## 2018-01-04 MED FILL — SODIUM CHLORIDE 0.9 % IV: INTRAVENOUS | Qty: 100

## 2018-01-04 MED FILL — ISOVUE-370  76 % INTRAVENOUS SOLUTION: 370 mg iodine /mL (76 %) | INTRAVENOUS | Qty: 100

## 2018-01-04 MED FILL — OMNIPAQUE 240 MG IODINE/ML INTRAVENOUS SOLUTION: 240 mg iodine/mL | INTRAVENOUS | Qty: 50

## 2018-01-08 ENCOUNTER — Encounter: Primary: Internal Medicine

## 2018-01-21 ENCOUNTER — Ambulatory Visit
Admit: 2018-01-21 | Discharge: 2018-01-21 | Payer: PRIVATE HEALTH INSURANCE | Attending: Registered Nurse | Primary: Internal Medicine

## 2018-01-21 ENCOUNTER — Encounter: Attending: Internal Medicine | Primary: Internal Medicine

## 2018-01-21 ENCOUNTER — Ambulatory Visit: Attending: Registered Nurse | Primary: Internal Medicine

## 2018-01-21 DIAGNOSIS — C61 Malignant neoplasm of prostate: Secondary | ICD-10-CM

## 2018-01-21 MED ORDER — PREDNISONE 5 MG TAB
5 mg | ORAL_TABLET | Freq: Every day | ORAL | 6 refills | Status: AC
Start: 2018-01-21 — End: ?

## 2018-01-21 NOTE — Progress Notes (Signed)
Bruce Clark is a 67 y.o. male here for follow up of prostate cancer.    1. Have you been to the ER, urgent care clinic since your last visit?: No.  Hospitalized since your last visit?:  No.    2. Have you seen or consulted any other health care providers outside of the [place] since your last visit?  Include any pap smears or colon screening.: No.

## 2018-01-21 NOTE — Progress Notes (Signed)
Bruce Clark is a 67 y.o. male here for follow up of prostate cancer.    1. Have you been to the ER, urgent care clinic since your last visit?: No.  Hospitalized since your last visit?:  No.    2. Have you seen or consulted any other health care providers outside of the [place] since your last visit?  Include any pap smears or colon screening.: No.

## 2018-01-21 NOTE — Progress Notes (Signed)
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 Mizrahi is a 67 y.o. male who is on Eliquis for a h/o DVT who presented to the Emergency Department on 04/22/16 with nausea, vomiting, and abdominal pain x 2 months, decreased appetite and weight loss of 10-14 lbs.  CTA revealed bilateral lung nodules and mediastinal/hilar adenopathy. PET revealed supraclavicular adenopathy, retroperitoneal adenopathy, bilateral iliac adenopathy, mediastinal/hilar adenopathy, bony metastatic disease, right lower lobe pulmonary mass, and multiple other smaller masses in bilateral lungs. USG guided bx of supraclavicular LN showed adenocarcinoma of the prostate. He was also diagnosed and treated for CAP. PSA on 05/26/16-2400. Was discussed in tumor board, Napsin A negative and a small cell component ruled out. Initiated Degarelix  05/26/16. MRI spine with expansile C7 lesion, T12 lesion encroaching epidural space, S1-S2 lesion encroaching neural foramina. Started palliative C6-T5 and T11-L5 XRT under Dr. Leron Croak on 06/09/16. Started Zytiga 08/28/16 with evidence of response. Noted to have a  Left lower lobe lung nodule on CT done 12/22/16. Biopsy consistent with Lung adenocarcinoma. He had a mediastinoscopy with LN being negative for metastatic disease. Robotic RLL lobectomy performed on 05/22/17.     He underwent a robotic right lower lobectomy on 05/22/17 with Dr. Glo Herring. Pathology showed adenocarcinoma T3N2 stage IIIB. Case discussed at tumor board and there is no plan for adjuvant chemotherapy at this time. He completed post op radiation with Dr. Leron Croak on 09/18/17.     Comes in today for follow-up. Had scans. Cough has improved and is dry. Shortness of breath is stable and only with exertion. Continues to stay active and walk daily. No fevers/chills. Right sided chest pain has  improved and he no longer is requiring pain medication. Eating and drinking well without nausea/vomiting. Denies diarrhea/constipation. No mouth sores. Continues to take Zytiga + Prednisone daily. Denies bleeding on Eliquis.     Otherwise, complete ROS is per the symptom report form which has been scanned into the media section of the electronic medical record.    Past Medical History:   Diagnosis Date   ??? Lung cancer ([place])    ??? Prostate cancer ([place])    ??? Stroke ([place])     PT STATES, "I HAD A STROKE A LONG TIME AGO"       Past Surgical History:   Procedure Laterality Date   ??? CHEST SURGERY PROCEDURE UNLISTED  04/27/2017    BRONCHOSCOPY/MEDIASTINOSCOPY   ??? HX GI      HERNIA REPAIR   ??? HX ORTHOPAEDIC  1994    ROD IN RIGHT LEG       No Known Allergies    Current Outpatient Medications   Medication Sig Dispense Refill   ??? apixaban (ELIQUIS) 5 mg tablet Take 1 Tab by mouth two (2) times a day. 14 Tab 0   ??? abiraterone (ZYTIGA) 500 mg tab Take 1,000 mg by mouth daily. 60 Tab 3   ??? morphine 10 mg/5 mL oral solution Take 2.5 mL by mouth every four (4) hours as needed for Pain. Max Daily Amount: 30 mg. 500 mL 0   ??? polyethylene glycol (MIRALAX) 17 gram packet Take 1 Packet by mouth daily. 30 Packet 2   ??? magic mouthwash solution Magic mouth wash   Maalox  Lidocaine 2% viscous   Diphenhydramine oral solution   Nystatin 100,000 units  Pharmacy to mix equal portions of ingredients to a total volume as indicated in the dispense amount.  Swish and spit 42m (1 tsp) four times daily as needed for mouth sores 480 mL 3   ??? predniSONE (DELTASONE) 5 mg tablet Take 1 Tab by mouth daily. Restart on Jun 01, 2017 30 Tab 3   ??? senna (SENOKOT) 8.6 mg tablet Take 1 Tab by mouth nightly. Indications: constipation 30 Tab 0       Social History     Socioeconomic History   ??? Marital status: SINGLE     Spouse name: Not on file   ??? Number of children: Not on file   ??? Years of education: Not on file   ??? Highest education level: Not on file    Tobacco Use   ??? Smoking status: Former Smoker     Packs/day: 0.50     Years: 47.00     Pack years: 23.50     Last attempt to quit: 06/2016     Years since quitting: 1.5   ??? Smokeless tobacco: Never Used   Substance and Sexual Activity   ??? Alcohol use: No   ??? Drug use: No       Family History   Problem Relation Age of Onset   ??? Cancer Mother         COLON   ??? Cancer Father         BRAIN TUMOR   ??? No Known Problems Sister    ??? Arthritis-osteo Brother    ??? No Known Problems Sister    ??? No Known Problems Sister    ??? No Known Problems Sister    ??? No Known Problems Brother    ??? Anesth Problems Neg Hx      ROS  A review of systems was obtained and is negative except as listed in HPI.  ECOG PS is 1    Physical Examination:   Visit Vitals  BP 136/81 (BP 1 Location: Right arm, BP Patient Position: Sitting)   Pulse 63   Temp 98.6 ??F (37 ??C) (Oral)   Resp 17   Ht 5' 7"  (1.702 m)   Wt 138 lb (62.6 kg)   SpO2 95%   BMI 21.61 kg/m??     General appearance - alert, and in no distress  Mental status - oriented to person, place, and time  Mouth - mucous membranes moist, no lesions noted  Neck - supple, no significant adenopathy  Resp-CTA, diminished, normal respiratory effort  CV-s1s2, no LE edema  Neurological - normal speech, no focal findings or movement disorder noted  Musculoskeletal - no joint tenderness, deformity or swelling  Extremities - peripheral pulses normal, no pedal edema, no clubbing or cyanosis  Skin - robotic incision site and previous chest tube site on right side of chest appear to be healing appropriately    LABS  Lab Results   Component Value Date/Time    WBC 4.7 01/01/2018 08:11 AM    HGB 10.7 (L) 01/01/2018 08:11 AM    HCT 34.6 (L) 01/01/2018 08:11 AM    PLATELET 189 01/01/2018 08:11 AM    MCV 87.6 01/01/2018 08:11 AM    ABS. NEUTROPHILS 3.5 01/01/2018 08:11 AM     Lab Results   Component Value Date/Time    Sodium 138 01/01/2018 08:11 AM    Potassium 3.6 01/01/2018 08:11 AM     Chloride 102 01/01/2018 08:11 AM    CO2 28 01/01/2018 08:11 AM  Glucose 84 01/01/2018 08:11 AM    BUN 8 01/01/2018 08:11 AM    Creatinine 0.57 (L) 01/01/2018 08:11 AM    GFR est AA >60 01/01/2018 08:11 AM    GFR est non-AA >60 01/01/2018 08:11 AM    Calcium 9.0 01/01/2018 08:11 AM     Lab Results   Component Value Date/Time    AST (SGOT) 11 (L) 01/01/2018 08:11 AM    Alk. phosphatase 84 01/01/2018 08:11 AM    Protein, total 7.2 01/01/2018 08:11 AM    Albumin 3.0 (L) 01/01/2018 08:11 AM    Globulin 4.2 (H) 01/01/2018 08:11 AM    A-G Ratio 0.7 (L) 01/01/2018 08:11 AM     Recent Labs     01/01/18  1610 11/27/17  0806 10/16/17  0759 10/06/17  1037 09/15/17  0909 09/01/17  0839 08/25/17  0959 07/13/17  0753 05/06/17  0923   PSA 0.1 0.1 0.1 0.1 0.1 0.1 0.1 0.1  --    PSALT  --   --   --   --   --   --   --   --  <0.1       IMAGING  PET CT  03/23/17  IMPRESSION: Mass lesion right lower lobe is hypermetabolic and compatible with  the biopsy confirmed the result of adenocarcinoma. There are soft tissue  densities in the anterior abdominal wall bilaterally which are stable compared  to the prior chest abdomen pelvis CT but new compared to the prior PET/CT.  Please correlate with any recent abdominal wall surgery as these may represent  port sites. Otherwise normal tracer distribution. Stable sclerotic osseous  metastatic disease without abnormal activity.    PATHOLOGY  Supraclavicular node   CYTOLOGIC INTERPRETATION:   Adenocarcinoma, consistent with prostate primary   See comment   General Categorization   Positive for malignancy.   Specimen Adequacy   Satisfactory for evaluation.   Comment   Touch preps and a core biopsy are examined and show islands of adenocarcinoma surrounded by fibrous stroma. A panel of immunohistochemical stains was performed to evaluate site of origin. The tumor cells are focally positive for PSA and PSAP and negative for CK7, CK20, TTF-1, CDX-2 and Napsin A. Morphology and immunoprofile are most  consistent with a metastatic prostatic adenocarcinoma.    Pathology from CT guided biopsy of R lung mass  Lung adenocarcinoma    Pathology from RLL lobectomy on 05/22/17:  1. Soft tissue, chest wall margin, biopsy:   Fragments of soft tissue involved by adenocarcinoma   2. Lymph node, station 7, excision:   One lymph node, positive for metastatic adenocarcinoma (1/1)   See comment   3. Lung, right lower lobe, lobectomy:   Adenocarcinoma, 6.7 cm, with involvement of parietal pleura   Four hilar lymph nodes, negative for metastatic carcinoma (0/4)     Bone scan 07/13/17:  IMPRESSION: Metastatic disease thoracic and lumbar spine.    CT neck/chest/abd/pelvis 07/13/17:  IMPRESSION:  1. CT of the neck demonstrates no adenopathy. The supraglottic airway is close  related to technique.. There is mild asymmetry in the true vocal cords right is  more prominent than the left and correlation would be helpful.  2. CT of the chest demonstrates right lower lobe lobectomy changes. There is  pleural thickening in the right mid and lower thorax posteriorly most likely  postoperative. There is also slight soft tissue prominence at the postoperative  site in the right hilum again most likely postsurgical but close  follow-up would  be helpful.  3. CT of the abdomen and pelvis demonstrates no adenopathy or mass. Slight  nodularity in the prostate gland is stable.  4. Bony metastatic disease to the spine is stable in appearance.    CT chest/abd/pelvis 10/10/17:  IMPRESSION:  1. 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 Clinical correlation is recommended.  2. Status post right lobectomy with postsurgical changes adjacent to the right  hilum and in the right pleura.  3. Stable osseous metastatic disease in the thoracic spine.    Bone scan 11/04/17:  IMPRESSION: Stable pattern of osseous metastatic disease.    CT CAP 01/04/18:  IMPRESSION:   Status post right lobectomy. Groundglass opacification in the right perihilar  lung has now retracted into a more confluent infiltrative appearance. This may  simply represent postradiation and postsurgical change within the lung but can  serve as a baseline going forward.  Stable osseous metastatic disease.  Stable appearance of prostrate and the surrounding tissue.  Known emphysema.    ASSESSMENT  Mr. Kreider is a 67 y.o. male with widely metastatic prostate adenocarcinoma and newly diagnosed lung adenocarcinoma status post right lower lobe lobectomy on 05/22/17 and presents today for follow-up.     PLAN    Prostate cancer  Castrate sensitive, treatment na??ve widely metastatic prostate cancer to lymph nodes above and below the diaphragm and bones.  Prostate adenocarcinoma diagnosed on biopsy of the supraclavicular lymph node. Discussed in tumor board and a small cell component has been ruled out.  PSA at the time of diagnosis at 2400.    Currently on Lupron 45 mg every 6 months. On Zytiga 1,0731m daily + prednisone 5 mg daily per [facility].   He has continued to have a radiologic and PSA response  CBC, CMP, PSA monthly in [facility]  Lupron q6 months     R lung adenocarcinoma, T3N2MX stage IIIB- Parietal pleural involvement- PD-L1 99%  S/p RLL lobectomy on 10/5  Very high risk of recurrence  Case was discussed in tumor board and the consensus was to offer PORT with N2 disease  PORT with Dr. PLeron CroakCompleted  09/18/17.     As regards adjuvant chemotherapy- we elected to not pursue this with another life limiting illness. We decided to consider immunotherapy with any evidence of recurrence.    Repeat imaging shows no recurrence of lung cancer as of 01/04/18.     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 Will continue to monitor. No signs of obvious bleeding.    Cough/URI  Resolved    Follow-up monthly with labs to include CBC with diff, CMP, PSA    RacJuliene PinaD

## 2018-01-21 NOTE — Progress Notes (Signed)
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 male who is  on Eliquis for a h/o DVT who presented to the Emergency Department on 04/22/16 with nausea, vomiting, and abdominal pain x 2 months, decreased appetite and weight loss of 10-14 lbs.   CTA revealed bilateral lung nodules and mediastinal/hilar adenopathy. PET revealed supraclavicular adenopathy, retroperitoneal adenopathy,  bilateral iliac adenopathy, mediastinal/hilar adenopathy, bony metastatic disease, right lower lobe pulmonary mass, and multiple other smaller masses in bilateral lungs. USG guided bx of supraclavicular  LN showed adenocarcinoma of the prostate. He was also diagnosed and treated for CAP. PSA on 05/26/16-2400. Was discussed in tumor board, Napsin A negative and a small cell component ruled out. Initiated Degarelix  05/26/16. MRI spine with expansile C7 lesion,  T12 lesion encroaching epidural space, S1-S2 lesion encroaching neural foramina. Started palliative C6-T5 and T11-L5 XRT under Dr. Leron Croak on 06/09/16. Started Zytiga 08/28/16 with evidence of response. Noted to have a  Left lower lobe lung nodule on  CT done 12/22/16. Biopsy consistent with Lung adenocarcinoma. He had a mediastinoscopy with LN being negative for metastatic disease. Robotic RLL lobectomy performed on 05/22/17.       He underwent a robotic right lower lobectomy on 05/22/17 with Dr. Glo Herring. Pathology showed adenocarcinoma T3N2 stage IIIB. Case discussed at tumor board and there is no plan for adjuvant chemotherapy at this  time. He completed post op radiation with  Dr. Leron Croak on 09/18/17.       Comes in today for follow-up. Had scans. Cough has improved and is dry. Shortness of breath is stable and only with exertion. Continues to stay active and walk daily. No fevers/chills. Right sided chest pain has improved and he no longer is requiring  pain medication. Eating and drinking well without nausea/vomiting. Denies diarrhea/constipation. No mouth sores. Continues to take Zytiga + Prednisone daily. Denies bleeding on Eliquis.       Otherwise, complete ROS is per the symptom report form which has been scanned into the media section of the electronic medical record.        Past Medical History:        Diagnosis  Date         ?  Lung cancer ([place])       ?  Prostate cancer ([place])       ?  Stroke [facility])            PT STATES, "I HAD A STROKE A LONG TIME AGO"             Past Surgical History:         Procedure  Laterality  Date          ?  CHEST SURGERY PROCEDURE UNLISTED  04/27/2017          BRONCHOSCOPY/MEDIASTINOSCOPY          ?  HX GI              HERNIA REPAIR          ?  HX ORTHOPAEDIC    1994          ROD IN RIGHT LEG           No Known Allergies        Current Outpatient Medications          Medication  Sig  Dispense  Refill           ?  apixaban (ELIQUIS) 5 mg tablet  Take 1 Tab by mouth two (2) times a day.  14 Tab  0     ?  abiraterone (ZYTIGA) 500 mg tab  Take 1,000 mg by mouth daily.  60 Tab  3     ?  morphine 10 mg/5 mL oral solution  Take 2.5 mL by mouth every four (4) hours as needed for Pain. Max Daily Amount: 30 mg.  500 mL  0     ?  polyethylene glycol (MIRALAX) 17 gram packet  Take 1 Packet by mouth daily.  30 Packet  2     ?  magic mouthwash solution  Magic mouth wash    Maalox   Lidocaine 2% viscous    Diphenhydramine oral solution    Nystatin 100,000 units      Pharmacy to mix equal portions of ingredients to a total volume as indicated in the dispense amount.   Swish and spit 66m (1 tsp) four times daily as needed for  mouth sores  480 mL  3     ?  predniSONE (DELTASONE) 5 mg tablet  Take 1 Tab by mouth daily. Restart on Jun 01, 2017  30 Tab  3           ?  senna (SENOKOT) 8.6 mg tablet  Take 1 Tab by mouth nightly. Indications: constipation  30 Tab  0             Social History          Socioeconomic History         ?  Marital status:  SINGLE              Spouse name:  Not on file         ?  Number of children:  Not on file     ?  Years of education:  Not on file     ?  Highest education level:  Not on file       Tobacco Use         ?  Smoking status:  Former Smoker              Packs/day:  0.50         Years:  47.00         Pack years:  23.50         Last attempt to quit:  06/2016         Years since quitting:  1.5         ?  Smokeless tobacco:  Never Used       Substance and Sexual Activity         ?  Alcohol use:  No         ?  Drug use:  No             Family History         Problem  Relation  Age of Onset          ?  Cancer  Mother                COLON          ?  Cancer  Father                BRAIN TUMOR          ?  No Known Problems  Sister       ?  Arthritis-osteo  Brother       ?  No Known Problems  Sister       ?  No Known Problems  Sister       ?  No Known Problems  Sister       ?  No Known Problems  Brother            ?  Anesth Problems  Neg Hx          ROS   A review of systems was obtained and is negative except as listed in HPI.   ECOG PS is 1      Physical Examination:    Visit Vitals      BP  136/81 (BP 1 Location: Right arm, BP Patient Position: Sitting)     Pulse  63     Temp  98.6 ??F (37 ??C) (Oral)     Resp  17     Ht  '5\' 7"'  (1.702 m)     Wt  138 lb (62.6 kg)     SpO2  95%        BMI  21.61 kg/m??        General appearance - alert, and in no distress   Mental status - oriented to person, place, and time   Mouth - mucous membranes moist, no lesions noted   Neck - supple, no significant adenopathy   Resp-CTA, diminished, normal respiratory effort   CV-s1s2, no LE edema   Neurological - normal speech, no focal  findings or movement disorder noted   Musculoskeletal - no joint tenderness, deformity or swelling   Extremities - peripheral pulses normal, no pedal edema, no clubbing or cyanosis   Skin - robotic incision site and previous chest tube site on right side of chest appear to be healing appropriately      LABS     Lab Results         Component  Value  Date/Time            WBC  4.7  01/01/2018 08:11 AM       HGB  10.7 (L)  01/01/2018 08:11 AM       HCT  34.6 (L)  01/01/2018 08:11 AM       PLATELET  189  01/01/2018 08:11 AM       MCV  87.6  01/01/2018 08:11 AM            ABS. NEUTROPHILS  3.5  01/01/2018 08:11 AM          Lab Results         Component  Value  Date/Time            Sodium  138  01/01/2018 08:11 AM       Potassium  3.6  01/01/2018 08:11 AM       Chloride  102  01/01/2018 08:11 AM       CO2  28  01/01/2018 08:11 AM       Glucose  84  01/01/2018 08:11 AM       BUN  8  01/01/2018 08:11 AM       Creatinine  0.57 (L)  01/01/2018 08:11 AM       GFR est AA  >60  01/01/2018 08:11 AM       GFR est non-AA  >60  01/01/2018 08:11 AM            Calcium  9.0  01/01/2018 08:11 AM          Lab Results         Component  Value  Date/Time            AST (SGOT)  11 (L)  01/01/2018 08:11 AM       Alk. phosphatase  84  01/01/2018 08:11 AM       Protein, total  7.2  01/01/2018 08:11 AM       Albumin  3.0 (L)  01/01/2018 08:11 AM       Globulin  4.2 (H)  01/01/2018 08:11 AM            A-G Ratio  0.7 (L)  01/01/2018 08:11 AM          Recent Labs                   01/01/18   3818  11/27/17   0806  10/16/17   0759  10/06/17   1037  09/15/17   0909  09/01/17   0839  08/25/17   0959  07/13/17   0753  05/06/17   0923     PSA  0.1  0.1  0.1  0.1  0.1  0.1  0.1  0.1   --                 PSALT   --    --    --    --    --    --    --    --   <0.1           IMAGING   PET CT  03/23/17   IMPRESSION: Mass lesion right lower lobe is hypermetabolic and compatible with   the biopsy confirmed the result of adenocarcinoma. There are soft tissue    densities in the anterior abdominal wall bilaterally which are stable compared   to the prior chest abdomen pelvis CT but new compared to the prior PET/CT.   Please correlate with any recent abdominal wall surgery as these may represent   port sites. Otherwise normal tracer distribution. Stable sclerotic osseous   metastatic disease without abnormal activity.      PATHOLOGY   Supraclavicular node    CYTOLOGIC INTERPRETATION:    Adenocarcinoma, consistent with prostate primary    See comment    General Categorization    Positive for malignancy.    Specimen Adequacy    Satisfactory for evaluation.    Comment    Touch preps and a core biopsy are examined and show islands of adenocarcinoma surrounded by fibrous stroma. A panel of immunohistochemical stains was performed to evaluate site of origin. The tumor cells are focally positive for PSA and PSAP and negative  for CK7, CK20, TTF-1, CDX-2 and Napsin A. Morphology and immunoprofile  are most consistent with a metastatic prostatic adenocarcinoma.      Pathology from CT guided biopsy of R lung mass   Lung adenocarcinoma      Pathology from RLL lobectomy on 05/22/17:   1. Soft tissue, chest wall margin, biopsy:    Fragments of soft tissue involved by adenocarcinoma    2. Lymph node, station 7, excision:    One lymph node, positive for metastatic adenocarcinoma (1/1)    See comment    3. Lung, right lower lobe, lobectomy:    Adenocarcinoma, 6.7 cm, with involvement of parietal pleura    Four hilar lymph nodes, negative for metastatic carcinoma (0/4)       Bone scan 07/13/17:   IMPRESSION: Metastatic disease thoracic and lumbar spine.      CT neck/chest/abd/pelvis 07/13/17:   IMPRESSION:   1. CT of the neck demonstrates no adenopathy. The supraglottic airway is close   related to technique.. There is mild asymmetry in the true vocal cords right is   more prominent than the left and correlation would be helpful.   2. CT of the chest demonstrates right lower lobe lobectomy  changes. There is   pleural thickening in the right mid and lower thorax posteriorly most likely   postoperative. There is also slight soft tissue prominence at the postoperative   site in the right hilum again most likely postsurgical but close follow-up would   be helpful.   3. CT of the abdomen and pelvis demonstrates no adenopathy or mass. Slight   nodularity in the prostate gland is stable.   4. Bony metastatic disease to the spine is stable in appearance.      CT chest/abd/pelvis 10/10/17:   IMPRESSION:   1. 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 Clinical correlation is recommended.   2. Status post right lobectomy with postsurgical changes adjacent to the right   hilum and in the right pleura.   3. Stable osseous metastatic disease in the thoracic spine.      Bone scan 11/04/17:   IMPRESSION: Stable pattern of osseous metastatic disease.      CT CAP 01/04/18:   IMPRESSION:   Status post right lobectomy. Groundglass opacification in the right perihilar   lung has now retracted into a more confluent infiltrative appearance. This may   simply represent postradiation and postsurgical change within the lung but can   serve as a baseline going forward.   Stable osseous metastatic disease.   Stable appearance of prostrate and the surrounding tissue.   Known emphysema.      ASSESSMENT   Bruce Clark is a  67 y.o. male with widely metastatic prostate adenocarcinoma and newly diagnosed lung adenocarcinoma  status post right lower lobe lobectomy on 05/22/17 and presents today for follow-up.       PLAN      Prostate cancer   Castrate sensitive, treatment na??ve widely metastatic prostate cancer to lymph nodes above and below the diaphragm and bones.   Prostate adenocarcinoma diagnosed on biopsy of the supraclavicular lymph node. Discussed in tumor board and a small cell component has been ruled out.   PSA at the time of diagnosis at 2400.      Currently on Lupron 45 mg  every 6 months. On Zytiga 1,022m daily + prednisone 5 mg daily per [place]ATT[place]UDE.    He has continued to have a radiologic and PSA response   [facility]  L[facility] q6 months       R lung adenocarcinoma, T3N2MX stage IIIB- Parietal pleural involvement-  PD-L1 99%   S/p RLL lobectomy on 10/5   Very high risk of recurrence   Case was discussed in tumor board and the consensus was to offer PORT with N2 disease   PORT with Dr. Leron Croak Completed  09/18/17.       As regards adjuvant chemotherapy- we elected to not pursue this with another life limiting illness. We decided to consider immunotherapy with any evidence of recurrence.      Repeat imaging shows no recurrence of lung cancer as of 01/04/18.       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 Will continue to monitor. No signs of obvious bleeding.      Cough/URI   Resolved      Follow-up monthly with labs to include CBC with diff, CMP, PSA      RaJuliene PinaMD

## 2018-01-22 NOTE — Progress Notes (Signed)
Lab orders prior to clinic visit monthly faxed complete to [facility] Scheduling.  Next [facility] appt due 02/17/18.  To primary team.

## 2018-01-22 NOTE — Progress Notes (Signed)
Lab orders prior to clinic visit monthly faxed complete to [facility] Scheduling.  Next [facility] appt due 02/17/18.  To primary team.

## 2018-02-17 ENCOUNTER — Inpatient Hospital Stay: Admit: 2018-02-17 | Payer: MEDICAID | Primary: Internal Medicine

## 2018-02-17 ENCOUNTER — Ambulatory Visit
Admit: 2018-02-17 | Discharge: 2018-02-17 | Payer: PRIVATE HEALTH INSURANCE | Attending: Registered Nurse | Primary: Internal Medicine

## 2018-02-17 ENCOUNTER — Ambulatory Visit: Attending: Registered Nurse | Primary: Internal Medicine

## 2018-02-17 DIAGNOSIS — C61 Malignant neoplasm of prostate: Secondary | ICD-10-CM

## 2018-02-17 LAB — CBC WITH AUTO DIFFERENTIAL
Basophils %: 0 % (ref 0–1)
Basophils Absolute: 0 10*3/uL (ref 0.0–0.1)
Eosinophils %: 2 % (ref 0–7)
Eosinophils Absolute: 0.1 10*3/uL (ref 0.0–0.4)
Granulocyte Absolute Count: 0 10*3/uL (ref 0.00–0.04)
Hematocrit: 32.1 % — ABNORMAL LOW (ref 36.6–50.3)
Hemoglobin: 10.1 g/dL — ABNORMAL LOW (ref 12.1–17.0)
Immature Granulocytes: 0 % (ref 0.0–0.5)
Lymphocytes %: 10 % — ABNORMAL LOW (ref 12–49)
Lymphocytes Absolute: 0.7 10*3/uL — ABNORMAL LOW (ref 0.8–3.5)
MCH: 27.5 PG (ref 26.0–34.0)
MCHC: 31.5 g/dL (ref 30.0–36.5)
MCV: 87.5 FL (ref 80.0–99.0)
MPV: 8.5 FL — ABNORMAL LOW (ref 8.9–12.9)
Monocytes %: 12 % (ref 5–13)
Monocytes Absolute: 0.8 10*3/uL (ref 0.0–1.0)
NRBC Absolute: 0 10*3/uL (ref 0.00–0.01)
Neutrophils %: 76 % — ABNORMAL HIGH (ref 32–75)
Neutrophils Absolute: 4.9 10*3/uL (ref 1.8–8.0)
Nucleated RBCs: 0 PER 100 WBC
Platelets: 202 10*3/uL (ref 150–400)
RBC: 3.67 M/uL — ABNORMAL LOW (ref 4.10–5.70)
RDW: 14.6 % — ABNORMAL HIGH (ref 11.5–14.5)
WBC: 6.5 10*3/uL (ref 4.1–11.1)

## 2018-02-17 LAB — COMPREHENSIVE METABOLIC PANEL
ALT: 8 U/L — ABNORMAL LOW (ref 12–78)
AST: 12 U/L — ABNORMAL LOW (ref 15–37)
Albumin/Globulin Ratio: 0.8 — ABNORMAL LOW (ref 1.1–2.2)
Albumin: 3.2 g/dL — ABNORMAL LOW (ref 3.5–5.0)
Alkaline Phosphatase: 80 U/L (ref 45–117)
Anion Gap: 5 mmol/L (ref 5–15)
BUN: 9 MG/DL (ref 6–20)
Bun/Cre Ratio: 14 (ref 12–20)
CO2: 27 mmol/L (ref 21–32)
Calcium: 8.9 MG/DL (ref 8.5–10.1)
Chloride: 106 mmol/L (ref 97–108)
Creatinine: 0.64 MG/DL — ABNORMAL LOW (ref 0.70–1.30)
EGFR IF NonAfrican American: >60 mL/min/{1.73_m2} (ref >60)
GFR African American: >60 mL/min/{1.73_m2} (ref >60)
Globulin: 3.8 g/dL (ref 2.0–4.0)
Glucose: 131 mg/dL — ABNORMAL HIGH (ref 65–100)
Potassium: 4 mmol/L (ref 3.5–5.1)
Sodium: 138 mmol/L (ref 136–145)
Total Bilirubin: 0.2 MG/DL (ref 0.2–1.0)
Total Protein: 7 g/dL (ref 6.4–8.2)

## 2018-02-17 LAB — PSA PROSTATIC SPECIFIC ANTIGEN: Pros. Spec. Antigen: 0.1 ng/mL (ref 0.01–4.0)

## 2018-02-17 LAB — METABOLIC PANEL, COMPREHENSIVE
A-G Ratio: 0.8 — ABNORMAL LOW (ref 1.1–2.2)
ALT (SGPT): 8 U/L — ABNORMAL LOW (ref 12–78)
AST (SGOT): 12 U/L — ABNORMAL LOW (ref 15–37)
Albumin: 3.2 g/dL — ABNORMAL LOW (ref 3.5–5.0)
Alk. phosphatase: 80 U/L (ref 45–117)
Anion gap: 5 mmol/L (ref 5–15)
BUN/Creatinine ratio: 14 (ref 12–20)
BUN: 9 MG/DL (ref 6–20)
Bilirubin, total: 0.2 MG/DL (ref 0.2–1.0)
CO2: 27 mmol/L (ref 21–32)
Calcium: 8.9 MG/DL (ref 8.5–10.1)
Chloride: 106 mmol/L (ref 97–108)
Creatinine: 0.64 MG/DL — ABNORMAL LOW (ref 0.70–1.30)
GFR est AA: >60 mL/min/{1.73_m2} (ref >60)
GFR est non-AA: >60 mL/min/{1.73_m2} (ref >60)
Globulin: 3.8 g/dL (ref 2.0–4.0)
Glucose: 131 mg/dL — ABNORMAL HIGH (ref 65–100)
Potassium: 4 mmol/L (ref 3.5–5.1)
Protein, total: 7 g/dL (ref 6.4–8.2)
Sodium: 138 mmol/L (ref 136–145)

## 2018-02-17 LAB — CBC WITH AUTOMATED DIFF
ABS. BASOPHILS: 0 10*3/uL (ref 0.0–0.1)
ABS. EOSINOPHILS: 0.1 10*3/uL (ref 0.0–0.4)
ABS. IMM. GRANS.: 0 10*3/uL (ref 0.00–0.04)
ABS. LYMPHOCYTES: 0.7 10*3/uL — ABNORMAL LOW (ref 0.8–3.5)
ABS. MONOCYTES: 0.8 10*3/uL (ref 0.0–1.0)
ABS. NEUTROPHILS: 4.9 10*3/uL (ref 1.8–8.0)
ABSOLUTE NRBC: 0 10*3/uL (ref 0.00–0.01)
BASOPHILS: 0 % (ref 0–1)
EOSINOPHILS: 2 % (ref 0–7)
HCT: 32.1 % — ABNORMAL LOW (ref 36.6–50.3)
HGB: 10.1 g/dL — ABNORMAL LOW (ref 12.1–17.0)
IMMATURE GRANULOCYTES: 0 % (ref 0.0–0.5)
LYMPHOCYTES: 10 % — ABNORMAL LOW (ref 12–49)
MCH: 27.5 PG (ref 26.0–34.0)
MCHC: 31.5 g/dL (ref 30.0–36.5)
MCV: 87.5 FL (ref 80.0–99.0)
MONOCYTES: 12 % (ref 5–13)
MPV: 8.5 FL — ABNORMAL LOW (ref 8.9–12.9)
NEUTROPHILS: 76 % — ABNORMAL HIGH (ref 32–75)
NRBC: 0 PER 100 WBC
PLATELET: 202 10*3/uL (ref 150–400)
RBC: 3.67 M/uL — ABNORMAL LOW (ref 4.10–5.70)
RDW: 14.6 % — ABNORMAL HIGH (ref 11.5–14.5)
WBC: 6.5 10*3/uL (ref 4.1–11.1)

## 2018-02-17 LAB — PSA, DIAGNOSTIC (PROSTATE SPECIFIC AG): Prostate Specific Ag: 0.1 ng/mL (ref 0.01–4.0)

## 2018-02-17 NOTE — Progress Notes (Signed)
 [facility] Lab Visit:      0830 Pt admit to [facility] ambulatory and in stable condition. No new concerns voiced. Labs drawn peripherally from right AC arm.          Visit Vitals  BP 123/71 (BP 1 Location: Right arm, BP Patient Position: Sitting)   Pulse 78   Temp 97.8 F (36.6 C)   Resp 18   SpO2 96%       0845 tolerated well. D/c home in no distress. Pt aware of next [facility] scheduled appointment .    Labs available in [facility] once resulted.

## 2018-02-17 NOTE — Progress Notes (Signed)
 Bruce Clark is a 67 y.o. male    Chief Complaint   Patient presents with   . Prostate Cancer       1. Have you been to the ER, urgent care clinic since your last visit?  Hospitalized since your last visit?No    2. Have you seen or consulted any other health care providers outside of the [facility] since your last visit?  Include any pap smears or colon screening. No

## 2018-02-17 NOTE — Progress Notes (Signed)
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 male who is on Eliquis for a h/o DVT who presented to the Emergency Department on 04/22/16 with nausea, vomiting, and abdominal pain x 2 months, decreased appetite and weight loss of 10-14 lbs.  CTA revealed bilateral lung nodules and mediastinal/hilar adenopathy. PET revealed supraclavicular adenopathy, retroperitoneal adenopathy, bilateral iliac adenopathy, mediastinal/hilar adenopathy, bony metastatic disease, right lower lobe pulmonary mass, and multiple other smaller masses in bilateral lungs. USG guided bx of supraclavicular LN showed adenocarcinoma of the prostate. He was also diagnosed and treated for CAP. PSA on 05/26/16-2400. Was discussed in tumor board, Napsin A negative and a small cell component ruled out. Initiated Degarelix  05/26/16. MRI spine with expansile C7 lesion, T12 lesion encroaching epidural space, S1-S2 lesion encroaching neural foramina. Started palliative C6-T5 and T11-L5 XRT under Dr. Leron Croak on 06/09/16. Started Zytiga 08/28/16 with evidence of response. Noted to have a  Left lower lobe lung nodule on CT done 12/22/16. Biopsy consistent with Lung adenocarcinoma. He had a mediastinoscopy with LN being negative for metastatic disease. Robotic RLL lobectomy performed on 05/22/17.     He underwent a robotic right lower lobectomy on 05/22/17 with Dr. Glo Herring. Pathology showed adenocarcinoma T3N2 stage IIIB. Case discussed at tumor board and there is no plan for adjuvant chemotherapy at this time. He completed post op radiation with Dr. Leron Croak on 09/18/17.     Comes in today for follow-up. Cough still present but is dry. Shortness of breath is stable and only with exertion. He uses his inhaler PRN. Continues to stay active and walk daily. No fevers/chills. He reports his  right sided chest pain comes and goes, but he no longer is requiring pain medication. Eating and drinking well without nausea/vomiting. Denies diarrhea/constipation. No mouth sores. Continues to take Zytiga + Prednisone daily. Denies bleeding on Eliquis.     Otherwise, complete ROS is per the symptom report form which has been scanned into the media section of the electronic medical record.    Past Medical History:   Diagnosis Date   ??? Lung cancer ([facility])    ??? Prostate cancer ([place])    ??? Stroke ([place])     PT STATES, "I HAD A STROKE A LONG TIME AGO"       Past Surgical History:   Procedure Laterality Date   ??? CHEST SURGERY PROCEDURE UNLISTED  04/27/2017    BRONCHOSCOPY/MEDIASTINOSCOPY   ??? HX GI      HERNIA REPAIR   ??? HX ORTHOPAEDIC  1994    ROD IN RIGHT LEG       No Known Allergies    Current Outpatient Medications   Medication Sig Dispense Refill   ??? predniSONE (DELTASONE) 5 mg tablet Take 1 Tab by mouth daily. Restart on Jun 01, 2017 30 Tab 6   ??? apixaban (ELIQUIS) 5 mg tablet Take 1 Tab by mouth two (2) times a day. 14 Tab 0   ??? abiraterone (ZYTIGA) 500 mg tab Take 1,000 mg by mouth daily. 60 Tab 3   ??? morphine 10 mg/5 mL oral solution Take 2.5 mL by mouth every four (4) hours as needed for Pain. Max Daily Amount: 30 mg. 500 mL 0   ??? polyethylene glycol (MIRALAX) 17 gram packet Take 1 Packet by mouth daily. [place]  2   ??? magic mouthwash solution Magic mouth wash   Maalox  Lidocaine 2% viscous   Diphenhydramine oral solution   Nystatin 100,000 units    Pharmacy to mix equal portions of ingredients to a total volume as indicated in the dispense amount.  Swish and spit 69m (1 tsp) four times daily as needed for mouth sores 480 mL 3   ??? senna (SENOKOT) 8.6 mg tablet Take 1 Tab by mouth nightly. Indications: constipation 30 Tab 0       Social History     Socioeconomic History   ??? Marital status: SINGLE     Spouse name: Not on file   ??? Number of children: Not on file   ??? Years of education: Not on file    ??? Highest education level: Not on file   Tobacco Use   ??? Smoking status: Former Smoker     Packs/day: 0.50     Years: 47.00     Pack years: 23.50     Last attempt to quit: 06/2016     Years since quitting: 1.6   ??? Smokeless tobacco: Never Used   Substance and Sexual Activity   ??? Alcohol use: No   ??? Drug use: No       Family History   Problem Relation Age of Onset   ??? Cancer Mother         COLON   ??? Cancer Father         BRAIN TUMOR   ??? No Known Problems Sister    ??? Arthritis-osteo Brother    ??? No Known Problems Sister    ??? No Known Problems Sister    ??? No Known Problems Sister    ??? No Known Problems Brother    ??? Anesth Problems Neg Hx      ROS  A review of systems was obtained and is negative except as listed in HPI.  ECOG PS is 1    Physical Examination:   Visit Vitals  BP 123/79 (BP 1 Location: Left arm, BP Patient Position: Sitting)   Pulse 83   Temp 98.1 ??F (36.7 ??C) (Oral)   Resp 14   Ht 5' 7"  (1.702 m)   Wt 141 lb 12.8 oz (64.3 kg)   SpO2 98%   BMI 22.21 kg/m??     General appearance - alert, and in no distress, appears fatigued  Mental status - oriented to person, place, and time  Mouth - mucous membranes moist, no lesions noted  Neck - supple, no significant adenopathy  Resp-CTA, RLL diminished, diminished, normal respiratory effort  CV-s1s2, no LE edema  ABD- soft, non tender, active bowel sounds  Neurological - normal speech, no focal findings or movement disorder noted  Musculoskeletal - no joint tenderness, deformity or swelling  Extremities - peripheral pulses normal, no pedal edema, no clubbing or cyanosis  Skin - robotic incision site and previous chest tube site on right side of chest appear to be healing appropriately    LABS  Lab Results   Component Value Date/Time    WBC 6.5 02/17/2018 08:51 AM    HGB 10.1 (L) 02/17/2018 08:51 AM    HCT 32.1 (L) 02/17/2018 08:51 AM    PLATELET 202 02/17/2018 08:51 AM    MCV 87.5 02/17/2018 08:51 AM    ABS. NEUTROPHILS PENDING 02/17/2018 08:51 AM     Lab Results    Component Value Date/Time    Sodium 138 01/01/2018 08:11 AM  Potassium 3.6 01/01/2018 08:11 AM    Chloride 102 01/01/2018 08:11 AM    CO2 28 01/01/2018 08:11 AM    Glucose 84 01/01/2018 08:11 AM    BUN 8 01/01/2018 08:11 AM    Creatinine 0.57 (L) 01/01/2018 08:11 AM    GFR est AA >60 01/01/2018 08:11 AM    GFR est non-AA >60 01/01/2018 08:11 AM    Calcium 9.0 01/01/2018 08:11 AM     Lab Results   Component Value Date/Time    AST (SGOT) 11 (L) 01/01/2018 08:11 AM    Alk. phosphatase 84 01/01/2018 08:11 AM    Protein, total 7.2 01/01/2018 08:11 AM    Albumin 3.0 (L) 01/01/2018 08:11 AM    Globulin 4.2 (H) 01/01/2018 08:11 AM    A-G Ratio 0.7 (L) 01/01/2018 08:11 AM     Recent Labs     01/01/18  3220 11/27/17  0806 10/16/17  0759 10/06/17  1037 09/15/17  0909 09/01/17  0839 08/25/17  0959 07/13/17  0753 05/06/17  0923   PSA 0.1 0.1 0.1 0.1 0.1 0.1 0.1 0.1  --    PSALT  --   --   --   --   --   --   --   --  <0.1       IMAGING  PET CT  03/23/17  IMPRESSION: Mass lesion right lower lobe is hypermetabolic and compatible with  the biopsy confirmed the result of adenocarcinoma. There are soft tissue  densities in the anterior abdominal wall bilaterally which are stable compared  to the prior chest abdomen pelvis CT but new compared to the prior PET/CT.  Please correlate with any recent abdominal wall surgery as these may represent  port sites. Otherwise normal tracer distribution. Stable sclerotic osseous  metastatic disease without abnormal activity.    PATHOLOGY  Supraclavicular node   CYTOLOGIC INTERPRETATION:   Adenocarcinoma, consistent with prostate primary   See comment   General Categorization   Positive for malignancy.   Specimen Adequacy   Satisfactory for evaluation.   Comment   Touch preps and a core biopsy are examined and show islands of adenocarcinoma surrounded by fibrous stroma. A panel of immunohistochemical stains was performed to evaluate site of origin. The  tumor cells are focally positive for PSA and PSAP and negative for CK7, CK20, TTF-1, CDX-2 and Napsin A. Morphology and immunoprofile are most consistent with a metastatic prostatic adenocarcinoma.    Pathology from CT guided biopsy of R lung mass  Lung adenocarcinoma    Pathology from RLL lobectomy on 05/22/17:  1. Soft tissue, chest wall margin, biopsy:   Fragments of soft tissue involved by adenocarcinoma   2. Lymph node, station 7, excision:   One lymph node, positive for metastatic adenocarcinoma (1/1)   See comment   3. Lung, right lower lobe, lobectomy:   Adenocarcinoma, 6.7 cm, with involvement of parietal pleura   Four hilar lymph nodes, negative for metastatic carcinoma (0/4)     Bone scan 07/13/17:  IMPRESSION: Metastatic disease thoracic and lumbar spine.    CT neck/chest/abd/pelvis 07/13/17:  IMPRESSION:  1. CT of the neck demonstrates no adenopathy. The supraglottic airway is close  related to technique.. There is mild asymmetry in the true vocal cords right is  more prominent than the left and correlation would be helpful.  2. CT of the chest demonstrates right lower lobe lobectomy changes. There is  pleural thickening in the right mid and lower thorax posteriorly most likely  postoperative. There is also slight soft tissue prominence at the postoperative  site in the right hilum again most likely postsurgical but close follow-up would  be helpful.  3. CT of the abdomen and pelvis demonstrates no adenopathy or mass. Slight  nodularity in the prostate gland is stable.  4. Bony metastatic disease to the spine is stable in appearance.    CT chest/abd/pelvis 10/10/17:  IMPRESSION:  1. 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 Clinical correlation is recommended.  2. Status post right lobectomy with postsurgical changes adjacent to the right  hilum and in the right pleura.  3. Stable osseous metastatic disease in the thoracic spine.    Bone scan 11/04/17:   IMPRESSION: Stable pattern of osseous metastatic disease.    CT CAP 01/04/18:  IMPRESSION:  Status post right lobectomy. Groundglass opacification in the right perihilar  lung has now retracted into a more confluent infiltrative appearance. This may  simply represent postradiation and postsurgical change within the lung but can  serve as a baseline going forward.  Stable osseous metastatic disease.  Stable appearance of prostrate and the surrounding tissue.  Known emphysema.    ASSESSMENT  Bruce Clark is a 67 y.o. male with widely metastatic prostate adenocarcinoma and newly diagnosed lung adenocarcinoma status post right lower lobe lobectomy on 05/22/17 and presents today for follow-up.     PLAN    Prostate cancer  Castrate sensitive, treatment na??ve widely metastatic prostate cancer to lymph nodes above and below the diaphragm and bones.  Prostate adenocarcinoma diagnosed on biopsy of the supraclavicular lymph node. Discussed in tumor board and a small cell component has been ruled out.  PSA at the time of diagnosis at 2400.    Currently on Lupron 45 mg every 6 months. On Zytiga 1,070m daily + prednisone 5 mg daily per [place].   He has continued to have a radiologic and PSA response  CBC, CMP, PSA monthly in [facility]  Lupron q6 months - next dose 06/18/18     R lung adenocarcinoma, T3N2MX stage IIIB- Parietal pleural involvement- PD-L1 99%  S/p RLL lobectomy on 10/5  Very high risk of recurrence  Case was discussed in tumor board and the consensus was to offer PORT with N2 disease  PORT with Dr. PLeron CroakCompleted  09/18/17.     As regards adjuvant chemotherapy- we elected to not pursue this with another life limiting illness. We decided to consider immunotherapy with any evidence of recurrence.    Repeat imaging shows no recurrence of lung cancer as of 01/04/18.     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 Will continue to monitor. No signs of obvious bleeding.    Cough  Dry cough  SOB with exertion, unchanged  Uses inhaler PRN    CT CAP and bone scan prior to next office visit in 4 weeks   Follow-up monthly with labs to include CBC with diff, CMP, PSA      Juliene Pina MD, [facility]

## 2018-02-17 NOTE — Progress Notes (Signed)
[facility] Lab Visit:      2956 Pt admit to [facility] ambulatory and in stable condition. No new concerns voiced. Labs drawn peripherally from right AC arm.          Visit Vitals  BP 123/71 (BP 1 Location: Right arm, BP Patient Position: Sitting)   Pulse 78   Temp 97.8 ??F (36.6 ??C)   Resp 18   SpO2 96%       0845 tolerated well. D/c home in no distress. Pt aware of next [facility] scheduled appointment .    Labs available in [facility] once resulted.

## 2018-02-17 NOTE — Progress Notes (Signed)
CALYX HAWKER is a 67 y.o. male    Chief Complaint   Patient presents with   ??? Prostate Cancer       1. Have you been to the ER, urgent care clinic since your last visit?  Hospitalized since your last visit?No    2. Have you seen or consulted any other health care providers outside of the [facility] since your last visit?  Include any pap smears or colon screening. No

## 2018-02-17 NOTE — Progress Notes (Signed)
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 male who is  on Eliquis for a h/o DVT who presented to the Emergency Department on 04/22/16 with nausea, vomiting, and abdominal pain x 2 months, decreased appetite and weight loss of 10-14 lbs.   CTA revealed bilateral lung nodules and mediastinal/hilar adenopathy. PET revealed supraclavicular adenopathy, retroperitoneal adenopathy,  bilateral iliac adenopathy, mediastinal/hilar adenopathy, bony metastatic disease, right lower lobe pulmonary mass, and multiple other smaller masses in bilateral lungs. USG guided bx of supraclavicular  LN showed adenocarcinoma of the prostate. He was also diagnosed and treated for CAP. PSA on 05/26/16-2400. Was discussed in tumor board, Napsin A negative and a small cell component ruled out. Initiated Degarelix  05/26/16. MRI spine with expansile C7 lesion,  T12 lesion encroaching epidural space, S1-S2 lesion encroaching neural foramina. Started palliative C6-T5 and T11-L5 XRT under Dr. Leron Croak on 06/09/16. Started Zytiga 08/28/16 with evidence of response. Noted to have a  Left lower lobe lung nodule on  CT done 12/22/16. Biopsy consistent with Lung adenocarcinoma. He had a mediastinoscopy with LN being negative for metastatic disease. Robotic RLL lobectomy performed on 05/22/17.       He underwent a robotic right lower lobectomy on 05/22/17 with Dr. Glo Herring. Pathology showed adenocarcinoma T3N2 stage IIIB. Case discussed at tumor board and there is no plan for adjuvant chemotherapy at this  time. He completed post op radiation with  Dr. Leron Croak on 09/18/17.       Comes in today for follow-up. Cough still present but is dry. Shortness of breath is stable and only with exertion. He uses his inhaler PRN. Continues to stay active and walk daily. No fevers/chills. He reports his right sided chest pain comes and goes,  but he no longer is requiring pain medication. Eating and drinking well without nausea/vomiting. Denies diarrhea/constipation. No mouth sores. Continues to take Zytiga + Prednisone daily. Denies bleeding on Eliquis.       Otherwise, complete ROS is per the symptom report form which has been scanned into the media section of the electronic medical record.        Past Medical History:        Diagnosis  Date         ?  Lung cancer ([place])       ?  Prostate cancer ([place])       ?  Stroke [facility])            PT STATES, "I HAD A STROKE A LONG TIME AGO"             Past Surgical History:         Procedure  Laterality  Date          ?  CHEST SURGERY PROCEDURE UNLISTED    04/27/2017          BRONCHOSCOPY/MEDIASTINOSCOPY          ?  HX GI              HERNIA REPAIR          ?  HX ORTHOPAEDIC    1994          ROD IN RIGHT LEG           No Known Allergies        Current Outpatient Medications          Medication  Sig  Dispense  Refill           ?  predniSONE (DELTASONE) 5 mg tablet  Take 1 Tab by mouth daily. Restart on Jun 01, 2017  30 Tab  6     ?  apixaban (ELIQUIS) 5 mg tablet  Take 1 Tab by mouth two (2) times a day.  14 Tab  0     ?  abiraterone (ZYTIGA) 500 mg tab  Take 1,000 mg by mouth daily.  60 Tab  3     ?  morphine 10 mg/5 mL oral solution  Take 2.5 mL by mouth every four (4) hours as needed for Pain. Max Daily Amount: 30 mg.  500 mL  0     ?  polyethylene glycol (MIRALAX) 17 gram packet  Take 1 Packet by mouth daily.  30 Packet  2     ?  magic mouthwash solution  Magic mouth wash    Maalox   Lidocaine 2% viscous    Diphenhydramine oral solution    Nystatin 100,000 units      Pharmacy to mix equal  portions of ingredients to a total volume as indicated in the dispense amount.   Swish and spit 71m (1 tsp) four times daily as needed for mouth sores  480 mL  3           ?  senna (SENOKOT) 8.6 mg tablet  Take 1 Tab by mouth nightly. Indications: constipation  30 Tab  0             Social History          Socioeconomic History         ?  Marital status:  SINGLE              Spouse name:  Not on file         ?  Number of children:  Not on file     ?  Years of education:  Not on file     ?  Highest education level:  Not on file       Tobacco Use         ?  Smoking status:  Former Smoker              Packs/day:  0.50         Years:  47.00         Pack years:  23.50         Last attempt to quit:  06/2016         Years since quitting:  1.6         ?  Smokeless tobacco:  Never Used       Substance and Sexual Activity         ?  Alcohol use:  No         ?  Drug use:  No             Family History         Problem  Relation  Age of Onset          ?  Cancer  Mother                COLON          ?  Cancer  Father                BRAIN TUMOR          ?  No Known Problems  Sister       ?  Arthritis-osteo  Brother       ?  No Known Problems  Sister       ?  No Known Problems  Sister       ?  No Known Problems  Sister       ?  No Known Problems  Brother            ?  Anesth Problems  Neg Hx          ROS   A review of systems was obtained and is negative except as listed in HPI.   ECOG PS is 1      Physical Examination:    Visit Vitals      BP  123/79 (BP 1 Location: Left arm, BP Patient Position: Sitting)     Pulse  83     Temp  98.1 ??F (36.7 ??C) (Oral)     Resp  14     Ht  '5\' 7"'  (1.702 m)     Wt  141 lb 12.8 oz (64.3 kg)     SpO2  98%        BMI  22.21 kg/m??        General appearance - alert, and in no distress, appears fatigued   Mental status - oriented to person, place, and time   Mouth - mucous membranes moist, no lesions noted   Neck - supple, no significant adenopathy   Resp-CTA, RLL diminished, diminished, normal  respiratory effort   CV-s1s2, no LE edema   ABD- soft, non tender, active bowel sounds   Neurological - normal speech, no focal findings or movement disorder noted   Musculoskeletal - no joint tenderness, deformity or swelling   Extremities - peripheral pulses normal, no pedal edema, no clubbing or cyanosis   Skin - robotic incision site and previous chest tube site on right side of chest appear to be healing appropriately      LABS     Lab Results         Component  Value  Date/Time            WBC  6.5  02/17/2018 08:51 AM       HGB  10.1 (L)  02/17/2018 08:51 AM       HCT  32.1 (L)  02/17/2018 08:51 AM       PLATELET  202  02/17/2018 08:51 AM       MCV  87.5  02/17/2018 08:51 AM            ABS. NEUTROPHILS  PENDING  02/17/2018 08:51 AM          Lab Results         Component  Value  Date/Time            Sodium  138  01/01/2018 08:11 AM       Potassium  3.6  01/01/2018 08:11 AM       Chloride  102  01/01/2018 08:11 AM       CO2  28  01/01/2018 08:11 AM       Glucose  84  01/01/2018 08:11 AM       BUN  8  01/01/2018 08:11 AM       Creatinine  0.57 (L)  01/01/2018 08:11 AM       GFR est AA  >60  01/01/2018 08:11 AM       GFR est non-AA  >60  01/01/2018 08:11 AM            Calcium  9.0  01/01/2018 08:11 AM          Lab Results         Component  Value  Date/Time            AST (SGOT)  11 (L)  01/01/2018 08:11 AM       Alk. phosphatase  84  01/01/2018 08:11 AM       Protein, total  7.2  01/01/2018 08:11 AM       Albumin  3.0 (L)  01/01/2018 08:11 AM       Globulin  4.2 (H)  01/01/2018 08:11 AM            A-G Ratio  0.7 (L)  01/01/2018 08:11 AM          Recent Labs                   01/01/18   0737  11/27/17   0806  10/16/17   0759  10/06/17   1037  09/15/17   0909  09/01/17   0839  08/25/17   0959  07/13/17   0753  05/06/17   0923     PSA  0.1  0.1  0.1  0.1  0.1  0.1  0.1  0.1   --                 PSALT   --    --    --    --    --    --    --    --   <0.1           IMAGING   PET CT  03/23/17   IMPRESSION: Mass lesion  right lower lobe is hypermetabolic and compatible with   the biopsy confirmed the result of adenocarcinoma. There are soft tissue   densities in the anterior abdominal wall bilaterally which are stable compared   to the prior chest abdomen pelvis CT but new compared to the prior PET/CT.   Please correlate with any recent abdominal wall surgery as these may represent   port sites. Otherwise normal tracer distribution. Stable sclerotic osseous   metastatic disease without abnormal activity.      PATHOLOGY   Supraclavicular node    CYTOLOGIC INTERPRETATION:    Adenocarcinoma, consistent with prostate primary    See comment    General Categorization    Positive for malignancy.    Specimen Adequacy    Satisfactory for evaluation.    Comment    Touch preps and a core biopsy are examined and show islands of adenocarcinoma surrounded by fibrous stroma. A panel of immunohistochemical stains was performed to evaluate site of origin. The tumor cells are focally positive for PSA and  PSAP and negative  for CK7, CK20, TTF-1, CDX-2 and Napsin A. Morphology and immunoprofile are most consistent with a metastatic prostatic adenocarcinoma.      Pathology from CT guided biopsy of R lung mass   Lung adenocarcinoma      Pathology from RLL lobectomy on 05/22/17:   1. Soft tissue, chest wall margin, biopsy:    Fragments of soft tissue involved by adenocarcinoma    2. Lymph node, station 7, excision:    One lymph node, positive for metastatic adenocarcinoma (1/1)    See comment    3. Lung, right lower lobe, lobectomy:    Adenocarcinoma, 6.7 cm, with involvement of parietal pleura    Four hilar lymph nodes, negative for metastatic carcinoma (0/4)       Bone scan 07/13/17:   IMPRESSION: Metastatic disease thoracic and lumbar spine.      CT neck/chest/abd/pelvis 07/13/17:   IMPRESSION:   1. CT of the neck demonstrates no adenopathy. The supraglottic airway is close   related to technique.. There is mild asymmetry in the true vocal cords right  is   more prominent than the left and correlation would be helpful.   2. CT of the chest demonstrates right lower lobe lobectomy changes. There is   pleural thickening in the right mid and lower thorax posteriorly most likely   postoperative. There is also slight soft tissue prominence at the postoperative   site in the right hilum again most likely postsurgical but close follow-up would   be helpful.   3. CT of the abdomen and pelvis demonstrates no adenopathy or mass. Slight   nodularity in the prostate gland is stable.   4. Bony metastatic disease to the spine is stable in appearance.      CT chest/abd/pelvis 10/10/17:   IMPRESSION:   1. 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 Clinical correlation is recommended.   2. Status post right lobectomy with postsurgical changes adjacent to the right   hilum and in the right pleura.   3. Stable osseous metastatic disease in the thoracic spine.      Bone scan 11/04/17:   IMPRESSION: Stable pattern of osseous metastatic disease.      CT CAP 01/04/18:   IMPRESSION:   Status post right lobectomy. Groundglass opacification in the right perihilar   lung has now retracted into a more confluent infiltrative appearance. This may   simply represent postradiation and postsurgical change within the lung but can   serve as a baseline going forward.   Stable osseous metastatic disease.   Stable appearance of prostrate and the surrounding tissue.   Known emphysema.      ASSESSMENT   Bruce Clark is a  67 y.o. male with widely metastatic prostate adenocarcinoma and newly diagnosed lung adenocarcinoma  status post right lower lobe lobectomy on 05/22/17 and presents today for follow-up.       PLAN      Prostate cancer   Castrate sensitive, treatment na??ve widely metastatic prostate cancer to lymph nodes above and below the diaphragm and bones.   Prostate adenocarcinoma diagnosed on biopsy of the supraclavicular lymph node. Discussed in tumor  board and a small cell component has been ruled out.   PSA at the time of diagnosis at 2400.      Currently on Lupron 45 mg every 6 months. On Zytiga 1,055m daily + prednisone 5 mg daily per [place].    He has continued to  have a radiologic and PSA response   CBC, CMP, PSA monthly in [facility]   Lupron q6 months - next dose 06/18/18       R lung adenocarcinoma, T3N2MX stage IIIB- Parietal pleural involvement-  PD-L1 99%   S/p RLL lobectomy on 10/5   Very high risk of recurrence   Case was discussed in tumor board and the consensus was to offer PORT with N2 disease   PORT with Dr. Leron Croak Completed  09/18/17.       As regards adjuvant chemotherapy- we elected to not pursue this with another life limiting illness. We decided to consider immunotherapy with any evidence of recurrence.      Repeat imaging shows no recurrence of lung cancer as of 01/04/18.       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 Will continue to monitor. No signs of obvious bleeding.      Cough   Dry cough   SOB with exertion, unchanged   Uses inhaler PRN      CT CAP and bone scan prior to next office visit in 4 weeks    Follow-up monthly with labs to include CBC with diff, CMP, PSA         RaJuliene PinaD, MSOld [facility]

## 2018-02-23 IMAGING — DX DG CHEST 2V
2 series · 2 of 2 positions shown · non-contrast
Comparison: Single-view of the chest 11/04/2014. PA and lateral
chest 10/29/2014.

CLINICAL DATA: Preoperative examination. Patient for hernia
surgery. Initial encounter.

EXAM:
CHEST  2 VIEW

[chest pa]
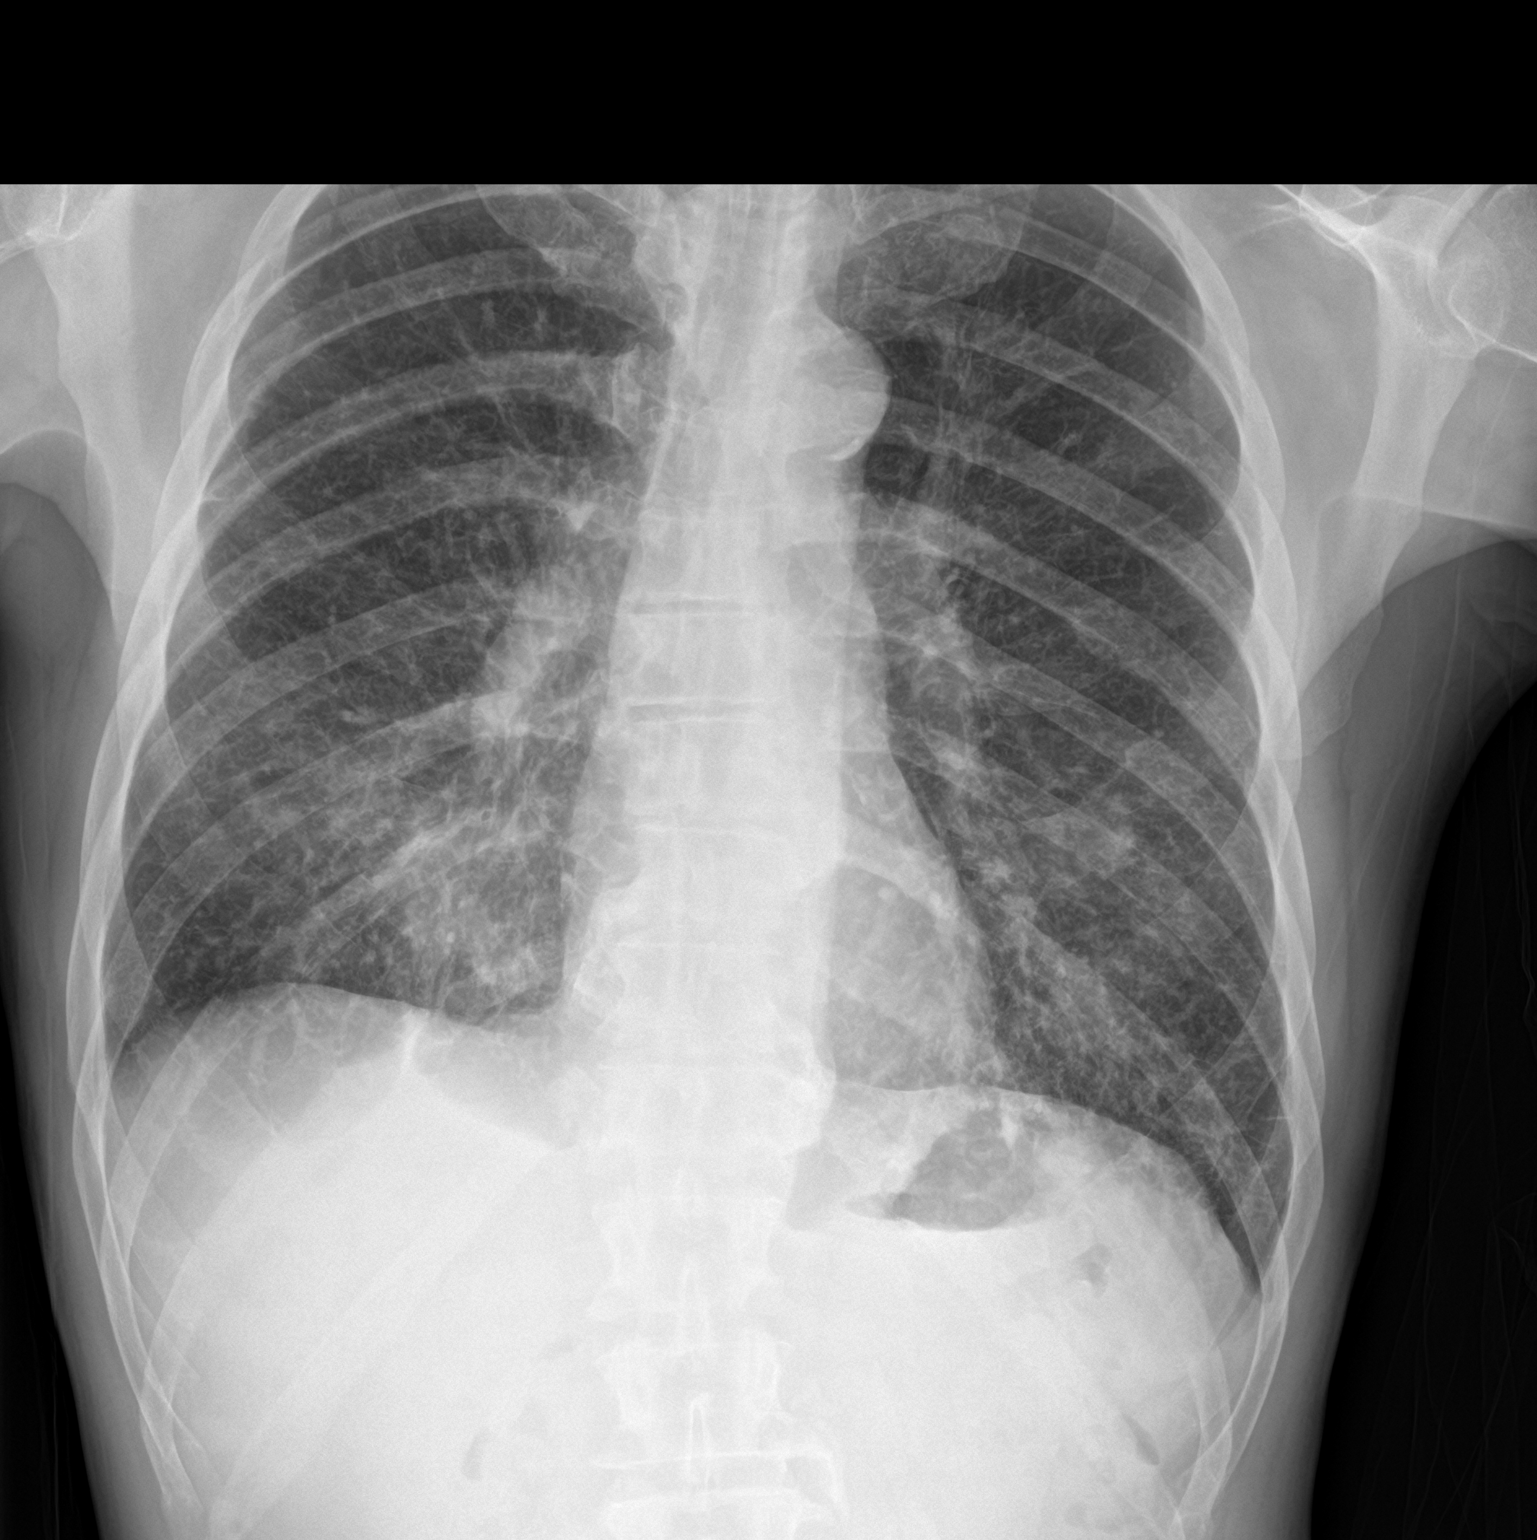

[chest lat]
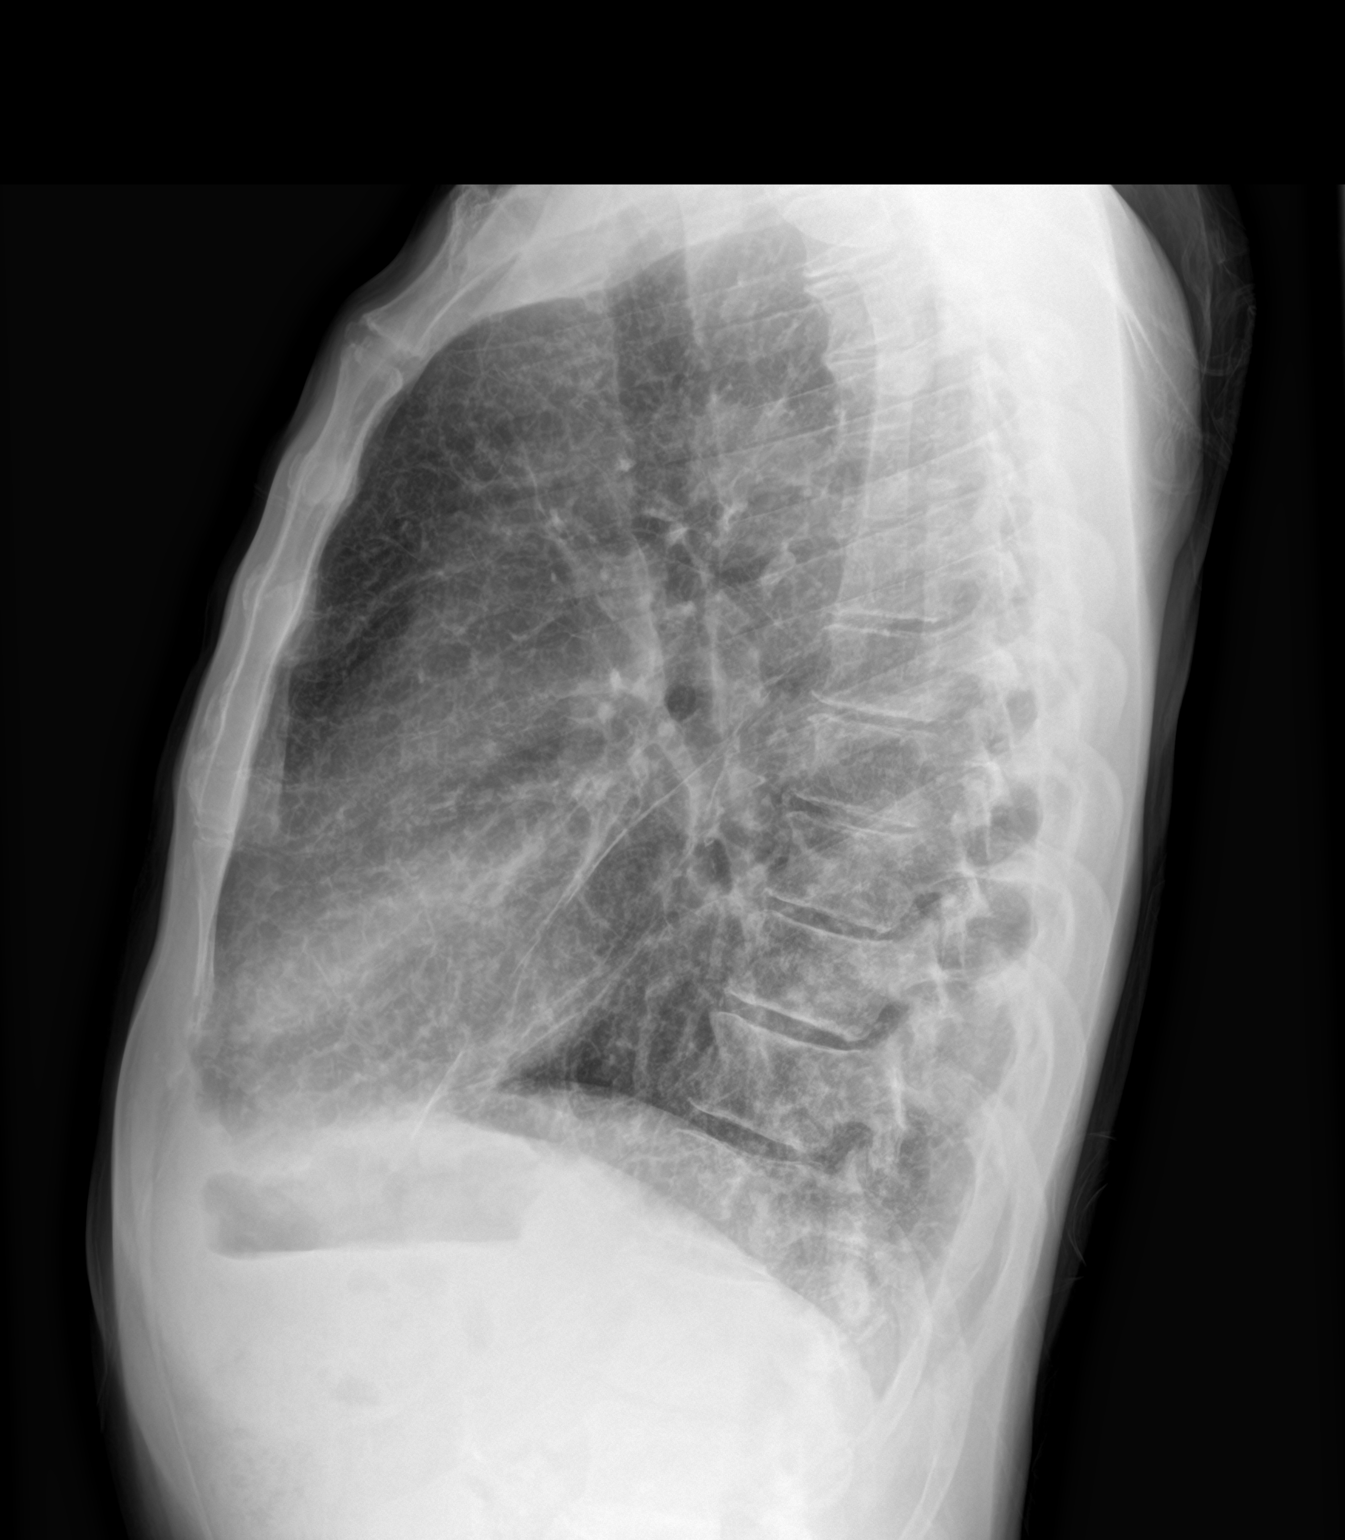

[2 of 2 positions shown; findings below may reference images not displayed]

FINDINGS: Reticulonodular pattern persists and appears slightly worse than on
the most recent examination. There may be more focal opacity in the
right lung base. Heart size is normal. No pneumothorax or pleural
effusion.
IMPRESSION: Reticulonodular pattern in the lung bases could be due to atypical
infection. There may be more focal airspace opacity the right lung
base. Chest CT with contrast is recommended for further evaluation.

These results will be called to the ordering clinician or
representative by the Radiologist Assistant, and communication
documented in the PACS or zVision Dashboard.

## 2018-03-19 ENCOUNTER — Encounter: Attending: Registered Nurse | Primary: Internal Medicine

## 2018-03-19 ENCOUNTER — Ambulatory Visit: Payer: MEDICAID | Primary: Internal Medicine

## 2018-03-19 ENCOUNTER — Inpatient Hospital Stay: Payer: MEDICAID | Primary: Internal Medicine

## 2018-03-19 ENCOUNTER — Inpatient Hospital Stay: Payer: MEDICAID | Attending: Registered Nurse | Primary: Internal Medicine

## 2018-03-22 ENCOUNTER — Inpatient Hospital Stay: Admit: 2018-03-22 | Payer: MEDICAID | Attending: Registered Nurse | Primary: Internal Medicine

## 2018-03-22 DIAGNOSIS — C61 Malignant neoplasm of prostate: Secondary | ICD-10-CM

## 2018-03-22 DIAGNOSIS — Z8546 Personal history of malignant neoplasm of prostate: Secondary | ICD-10-CM

## 2018-03-22 MED ORDER — IOHEXOL 240 MG/ML IV SOLN
240 mg iodine/mL | Freq: Once | INTRAVENOUS | Status: AC
Start: 2018-03-22 — End: 2018-03-22
  Administered 2018-03-22: 19:00:00 via ORAL

## 2018-03-22 MED ORDER — IOPAMIDOL 76 % IV SOLN
370 mg iodine /mL (76 %) | Freq: Once | INTRAVENOUS | Status: AC
Start: 2018-03-22 — End: 2018-03-22
  Administered 2018-03-22: 19:00:00 via INTRAVENOUS

## 2018-03-22 MED ORDER — SODIUM CHLORIDE 0.9 % IJ SYRG
Freq: Once | INTRAMUSCULAR | Status: AC
Start: 2018-03-22 — End: 2018-03-22
  Administered 2018-03-22: 19:00:00 via INTRAVENOUS

## 2018-03-22 MED ORDER — TECHNETIUM TC 99M MEDRONATE IV KIT
Freq: Once | Status: AC
Start: 2018-03-22 — End: 2018-03-22
  Administered 2018-03-22: 16:00:00 via INTRAVENOUS

## 2018-03-22 MED ORDER — SODIUM CHLORIDE 0.9% BOLUS IV
0.9 % | Freq: Once | INTRAVENOUS | Status: AC
Start: 2018-03-22 — End: 2018-03-22
  Administered 2018-03-22: 19:00:00 via INTRAVENOUS

## 2018-03-22 MED FILL — SODIUM CHLORIDE 0.9 % IV: INTRAVENOUS | Qty: 100

## 2018-03-22 MED FILL — NORMAL SALINE FLUSH 0.9 % INJECTION SYRINGE: INTRAMUSCULAR | Qty: 10

## 2018-03-22 MED FILL — OMNIPAQUE 240 MG IODINE/ML INTRAVENOUS SOLUTION: 240 mg iodine/mL | INTRAVENOUS | Qty: 50

## 2018-03-22 MED FILL — ISOVUE-370  76 % INTRAVENOUS SOLUTION: 370 mg iodine /mL (76 %) | INTRAVENOUS | Qty: 100

## 2018-03-24 ENCOUNTER — Inpatient Hospital Stay: Admit: 2018-03-24 | Payer: MEDICAID | Primary: Internal Medicine

## 2018-03-24 ENCOUNTER — Ambulatory Visit: Admit: 2018-03-24 | Payer: PRIVATE HEALTH INSURANCE | Attending: Internal Medicine | Primary: Internal Medicine

## 2018-03-24 ENCOUNTER — Ambulatory Visit: Attending: Internal Medicine | Primary: Internal Medicine

## 2018-03-24 DIAGNOSIS — C61 Malignant neoplasm of prostate: Secondary | ICD-10-CM

## 2018-03-24 LAB — CBC WITH AUTO DIFFERENTIAL
Basophils %: 0 % (ref 0–1)
Basophils Absolute: 0 10*3/uL (ref 0.0–0.1)
Eosinophils %: 1 % (ref 0–7)
Eosinophils Absolute: 0.1 10*3/uL (ref 0.0–0.4)
Granulocyte Absolute Count: 0 10*3/uL (ref 0.00–0.04)
Hematocrit: 33.1 % — ABNORMAL LOW (ref 36.6–50.3)
Hemoglobin: 10.6 g/dL — ABNORMAL LOW (ref 12.1–17.0)
Immature Granulocytes: 0 % (ref 0.0–0.5)
Lymphocytes %: 10 % — ABNORMAL LOW (ref 12–49)
Lymphocytes Absolute: 0.8 10*3/uL (ref 0.8–3.5)
MCH: 28 PG (ref 26.0–34.0)
MCHC: 32 g/dL (ref 30.0–36.5)
MCV: 87.3 FL (ref 80.0–99.0)
MPV: 9 FL (ref 8.9–12.9)
Monocytes %: 11 % (ref 5–13)
Monocytes Absolute: 0.8 10*3/uL (ref 0.0–1.0)
NRBC Absolute: 0 10*3/uL (ref 0.00–0.01)
Neutrophils %: 78 % — ABNORMAL HIGH (ref 32–75)
Neutrophils Absolute: 5.9 10*3/uL (ref 1.8–8.0)
Nucleated RBCs: 0 PER 100 WBC
Platelets: 207 10*3/uL (ref 150–400)
RBC: 3.79 M/uL — ABNORMAL LOW (ref 4.10–5.70)
RDW: 14.7 % — ABNORMAL HIGH (ref 11.5–14.5)
WBC: 7.6 10*3/uL (ref 4.1–11.1)

## 2018-03-24 LAB — COMPREHENSIVE METABOLIC PANEL
ALT: 11 U/L — ABNORMAL LOW (ref 12–78)
AST: 18 U/L (ref 15–37)
Albumin/Globulin Ratio: 0.7 — ABNORMAL LOW (ref 1.1–2.2)
Albumin: 3.4 g/dL — ABNORMAL LOW (ref 3.5–5.0)
Alkaline Phosphatase: 86 U/L (ref 45–117)
Anion Gap: 7 mmol/L (ref 5–15)
BUN: 11 MG/DL (ref 6–20)
Bun/Cre Ratio: 17 (ref 12–20)
CO2: 24 mmol/L (ref 21–32)
Calcium: 9.5 MG/DL (ref 8.5–10.1)
Chloride: 105 mmol/L (ref 97–108)
Creatinine: 0.65 MG/DL — ABNORMAL LOW (ref 0.70–1.30)
EGFR IF NonAfrican American: >60 mL/min/{1.73_m2} (ref >60)
GFR African American: >60 mL/min/{1.73_m2} (ref >60)
Globulin: 4.8 g/dL — ABNORMAL HIGH (ref 2.0–4.0)
Glucose: 83 mg/dL (ref 65–100)
Potassium: 3.7 mmol/L (ref 3.5–5.1)
Sodium: 136 mmol/L (ref 136–145)
Total Bilirubin: 0.4 MG/DL (ref 0.2–1.0)
Total Protein: 8.2 g/dL (ref 6.4–8.2)

## 2018-03-24 LAB — PSA PROSTATIC SPECIFIC ANTIGEN: Pros. Spec. Antigen: 0.1 ng/mL (ref 0.01–4.0)

## 2018-03-24 LAB — METABOLIC PANEL, COMPREHENSIVE
A-G Ratio: 0.7 — ABNORMAL LOW (ref 1.1–2.2)
ALT (SGPT): 11 U/L — ABNORMAL LOW (ref 12–78)
AST (SGOT): 18 U/L (ref 15–37)
Albumin: 3.4 g/dL — ABNORMAL LOW (ref 3.5–5.0)
Alk. phosphatase: 86 U/L (ref 45–117)
Anion gap: 7 mmol/L (ref 5–15)
BUN/Creatinine ratio: 17 (ref 12–20)
BUN: 11 MG/DL (ref 6–20)
Bilirubin, total: 0.4 MG/DL (ref 0.2–1.0)
CO2: 24 mmol/L (ref 21–32)
Calcium: 9.5 MG/DL (ref 8.5–10.1)
Chloride: 105 mmol/L (ref 97–108)
Creatinine: 0.65 MG/DL — ABNORMAL LOW (ref 0.70–1.30)
GFR est AA: >60 mL/min/{1.73_m2} (ref >60)
GFR est non-AA: >60 mL/min/{1.73_m2} (ref >60)
Globulin: 4.8 g/dL — ABNORMAL HIGH (ref 2.0–4.0)
Glucose: 83 mg/dL (ref 65–100)
Potassium: 3.7 mmol/L (ref 3.5–5.1)
Protein, total: 8.2 g/dL (ref 6.4–8.2)
Sodium: 136 mmol/L (ref 136–145)

## 2018-03-24 LAB — CBC WITH AUTOMATED DIFF
ABS. BASOPHILS: 0 10*3/uL (ref 0.0–0.1)
ABS. EOSINOPHILS: 0.1 10*3/uL (ref 0.0–0.4)
ABS. IMM. GRANS.: 0 10*3/uL (ref 0.00–0.04)
ABS. LYMPHOCYTES: 0.8 10*3/uL (ref 0.8–3.5)
ABS. MONOCYTES: 0.8 10*3/uL (ref 0.0–1.0)
ABS. NEUTROPHILS: 5.9 10*3/uL (ref 1.8–8.0)
ABSOLUTE NRBC: 0 10*3/uL (ref 0.00–0.01)
BASOPHILS: 0 % (ref 0–1)
EOSINOPHILS: 1 % (ref 0–7)
HCT: 33.1 % — ABNORMAL LOW (ref 36.6–50.3)
HGB: 10.6 g/dL — ABNORMAL LOW (ref 12.1–17.0)
IMMATURE GRANULOCYTES: 0 % (ref 0.0–0.5)
LYMPHOCYTES: 10 % — ABNORMAL LOW (ref 12–49)
MCH: 28 PG (ref 26.0–34.0)
MCHC: 32 g/dL (ref 30.0–36.5)
MCV: 87.3 FL (ref 80.0–99.0)
MONOCYTES: 11 % (ref 5–13)
MPV: 9 FL (ref 8.9–12.9)
NEUTROPHILS: 78 % — ABNORMAL HIGH (ref 32–75)
NRBC: 0 PER 100 WBC
PLATELET: 207 10*3/uL (ref 150–400)
RBC: 3.79 M/uL — ABNORMAL LOW (ref 4.10–5.70)
RDW: 14.7 % — ABNORMAL HIGH (ref 11.5–14.5)
WBC: 7.6 10*3/uL (ref 4.1–11.1)

## 2018-03-24 LAB — PSA, DIAGNOSTIC (PROSTATE SPECIFIC AG): Prostate Specific Ag: 0.1 ng/mL (ref 0.01–4.0)

## 2018-03-24 MED ORDER — TRAMADOL 50 MG TAB
50 mg | ORAL_TABLET | Freq: Four times a day (QID) | ORAL | 0 refills | Status: AC | PRN
Start: 2018-03-24 — End: 2018-03-27

## 2018-03-24 NOTE — Progress Notes (Signed)
[facility] Lab Visit:          4315  Pt arrived ambulatory to [facility] in stable condition. No new concerns voiced. Labs drawn from right hand and sent for processing. Tolerated well. D/c [facility] ambulatory and in no distress. Pt aware of next [facility] appointment scheduled.            Visit Vitals  BP 122/78 (BP 1 Location: Left arm, BP Patient Position: Sitting)   Pulse 88   Temp 98.6 ??F (37 ??C)   Resp 20   SpO2 97%       Labs available in [facility] once resulted.

## 2018-03-24 NOTE — Progress Notes (Signed)
Bruce Clark is a 67 y.o. male  Chief Complaint   Patient presents with   ??? Follow-up   ??? Prostate Cancer     1. Have you been to the ER, urgent care clinic since your last visit?  Hospitalized since your last visit?No    2. Have you seen or consulted any other health care providers outside of the [place] since your last visit?  Include any pap smears or colon screening. No     Patient needs prescription for pain medicine

## 2018-03-24 NOTE — Progress Notes (Signed)
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 male who is on Eliquis for a h/o DVT who presented to the Emergency Department on 04/22/16 with nausea, vomiting, and abdominal pain x 2 months, decreased appetite and weight loss of 10-14 lbs.  CTA revealed bilateral lung nodules and mediastinal/hilar adenopathy. PET revealed supraclavicular adenopathy, retroperitoneal adenopathy, bilateral iliac adenopathy, mediastinal/hilar adenopathy, bony metastatic disease, right lower lobe pulmonary mass, and multiple other smaller masses in bilateral lungs. USG guided bx of supraclavicular LN showed adenocarcinoma of the prostate. He was also diagnosed and treated for CAP. PSA on 05/26/16-2400. Was discussed in tumor board, Napsin A negative and a small cell component ruled out. Initiated Degarelix  05/26/16. MRI spine with expansile C7 lesion, T12 lesion encroaching epidural space, S1-S2 lesion encroaching neural foramina. Started palliative C6-T5 and T11-L5 XRT under Dr. Leron Croak on 06/09/16. Started Zytiga 08/28/16 with evidence of response. Noted to have a  Left lower lobe lung nodule on CT done 12/22/16. Biopsy consistent with Lung adenocarcinoma. He had a mediastinoscopy with LN being negative for metastatic disease. Robotic RLL lobectomy performed on 05/22/17.     He underwent a robotic right lower lobectomy on 05/22/17 with Dr. Glo Herring. Pathology showed adenocarcinoma T3N2 stage IIIB. Case discussed at tumor board and there is no plan for adjuvant chemotherapy at this time. He completed post op radiation with Dr. Leron Croak on 09/18/17.     Comes in today for follow-up. Had scans.   Cough still present but improved.  Continues to stay active and walk daily. No fevers/chills. He reports his right sided chest pain comes and goes, and tylenol does not help.   No nausea/vomiting. Denies diarrhea/constipation. No mouth sores. Continues to take Zytiga + Prednisone daily. Denies bleeding on Eliquis.     Otherwise, complete ROS is per the symptom report form which has been scanned into the media section of the electronic medical record.    Past Medical History:   Diagnosis Date   ??? Lung cancer ([place])    ??? Prostate cancer ([place])    ??? Stroke ([place])     PT STATES, "I HAD A STROKE A LONG TIME AGO"       Past Surgical History:   Procedure Laterality Date   ??? CHEST SURGERY PROCEDURE UNLISTED  04/27/2017    BRONCHOSCOPY/MEDIASTINOSCOPY   ??? HX GI      HERNIA REPAIR   ??? HX ORTHOPAEDIC  1994    ROD IN RIGHT LEG       No Known Allergies    Current Outpatient Medications   Medication Sig Dispense Refill   ??? magnesium hydroxide (PHILLIPS MILK OF MAGNESIA) 400 mg/5 mL suspension Take 30 mL by mouth daily as needed for Constipation.     ??? predniSONE (DELTASONE) 5 mg tablet Take 1 Tab by mouth daily. Restart on Jun 01, 2017 30 Tab 6   ??? apixaban (ELIQUIS) 5 mg tablet Take 1 Tab by mouth two (2) times a day. 14 Tab 0   ??? abiraterone (ZYTIGA) 500 mg tab Take 1,000 mg by mouth daily. 60 Tab 3   ??? morphine 10 mg/5 mL oral solution Take 2.5 mL by mouth every four (4) hours as needed for Pain. Max Daily Amount: 30 mg. 500 mL 0   ??? magic mouthwash solution Magic mouth  wash   Maalox  Lidocaine 2% viscous   Diphenhydramine oral solution   Nystatin 100,000 units    Pharmacy to mix equal portions of ingredients to a total volume as indicated in the dispense amount.  Swish and spit 18m (1 tsp) four times daily as needed for mouth sores 480 mL 3   ??? senna (SENOKOT) 8.6 mg tablet Take 1 Tab by mouth nightly. Indications: constipation 30 Tab 0   ??? polyethylene glycol (MIRALAX) 17 gram packet Take 1 Packet by mouth daily. 30 Packet 2       Social History     Socioeconomic History   ??? Marital status: SINGLE     Spouse name: Not on file   ??? Number of children: Not on file   ??? Years of education: Not on file    ??? Highest education level: Not on file   Tobacco Use   ??? Smoking status: Former Smoker     Packs/day: 0.50     Years: 47.00     Pack years: 23.50     Last attempt to quit: 06/2016     Years since quitting: 1.7   ??? Smokeless tobacco: Never Used   Substance and Sexual Activity   ??? Alcohol use: No   ??? Drug use: No       Family History   Problem Relation Age of Onset   ??? Cancer Mother         COLON   ??? Cancer Father         BRAIN TUMOR   ??? No Known Problems Sister    ??? Arthritis-osteo Brother    ??? No Known Problems Sister    ??? No Known Problems Sister    ??? No Known Problems Sister    ??? No Known Problems Brother    ??? Anesth Problems Neg Hx      ROS  A review of systems was obtained and is negative except as listed in HPI.  ECOG PS is 1    Physical Examination:   Visit Vitals  BP 125/78   Pulse 83   Temp 98 ??F (36.7 ??C) (Oral)   Resp 18   Ht 5' 7" (1.702 m)   Wt 140 lb 12.8 oz (63.9 kg)   SpO2 97%   BMI 22.05 kg/m??     General appearance - alert, and in no distress, appears fatigued  Mental status - oriented to person, place, and time  Mouth - mucous membranes moist, no lesions noted  Neck - supple, no significant adenopathy  Resp-CTA, RLL diminished, diminished, normal respiratory effort  ABD- soft, non tender, active bowel sounds  Neurological - normal speech, no focal findings or movement disorder noted  Musculoskeletal - no joint tenderness, deformity or swelling  Extremities - peripheral pulses normal, no pedal edema, no clubbing or cyanosis  Skin - robotic incision site and previous chest tube site on right side of chest appear to be healing appropriately    LABS  Lab Results   Component Value Date/Time    WBC 6.5 02/17/2018 08:51 AM    HGB 10.1 (L) 02/17/2018 08:51 AM    HCT 32.1 (L) 02/17/2018 08:51 AM    PLATELET 202 02/17/2018 08:51 AM    MCV 87.5 02/17/2018 08:51 AM    ABS. NEUTROPHILS 4.9 02/17/2018 08:51 AM     Lab Results   Component Value Date/Time    Sodium 138 02/17/2018 08:51 AM     Potassium 4.0 02/17/2018 08:51 AM  Chloride 106 02/17/2018 08:51 AM    CO2 27 02/17/2018 08:51 AM    Glucose 131 (H) 02/17/2018 08:51 AM    BUN 9 02/17/2018 08:51 AM    Creatinine 0.64 (L) 02/17/2018 08:51 AM    GFR est AA >60 02/17/2018 08:51 AM    GFR est non-AA >60 02/17/2018 08:51 AM    Calcium 8.9 02/17/2018 08:51 AM     Lab Results   Component Value Date/Time    AST (SGOT) 12 (L) 02/17/2018 08:51 AM    Alk. phosphatase 80 02/17/2018 08:51 AM    Protein, total 7.0 02/17/2018 08:51 AM    Albumin 3.2 (L) 02/17/2018 08:51 AM    Globulin 3.8 02/17/2018 08:51 AM    A-G Ratio 0.8 (L) 02/17/2018 08:51 AM     Recent Labs     02/17/18  0851 01/01/18  0811 11/27/17  0806 10/16/17  0759 10/06/17  1037 09/15/17  0909 09/01/17  0839 08/25/17  0959 07/13/17  0753   PSA 0.1 0.1 0.1 0.1 0.1 0.1 0.1 0.1 0.1       IMAGING  PET CT  03/23/17  IMPRESSION: Mass lesion right lower lobe is hypermetabolic and compatible with  the biopsy confirmed the result of adenocarcinoma. There are soft tissue  densities in the anterior abdominal wall bilaterally which are stable compared  to the prior chest abdomen pelvis CT but new compared to the prior PET/CT.  Please correlate with any recent abdominal wall surgery as these may represent  port sites. Otherwise normal tracer distribution. Stable sclerotic osseous  metastatic disease without abnormal activity.    PATHOLOGY  Supraclavicular node   CYTOLOGIC INTERPRETATION:   Adenocarcinoma, consistent with prostate primary   See comment   General Categorization   Positive for malignancy.   Specimen Adequacy   Satisfactory for evaluation.   Comment   Touch preps and a core biopsy are examined and show islands of adenocarcinoma surrounded by fibrous stroma. A panel of immunohistochemical stains was performed to evaluate site of origin. The tumor cells are focally positive for PSA and PSAP and negative for CK7, CK20, TTF-1    CT CAP 03/22/18  IMPRESSION  IMPRESSION:    1. Status post right lung surgery with right perihilar consolidation and  scarring consistent with post surgery and radiation therapy change. These  findings are stable.  2. Enlarged prostate.  3. Sclerotic bone metastases unchanged.  4. Atherosclerotic aorta without aneurysm      ASSESSMENT  Bruce Clark is a 67 y.o. male with widely metastatic prostate adenocarcinoma and newly diagnosed lung adenocarcinoma status post right lower lobe lobectomy on 05/22/17 and presents today for follow-up.     PLAN    Prostate cancer  Castrate sensitive, treatment na??ve widely metastatic prostate cancer to lymph nodes above and below the diaphragm and bones.  Prostate adenocarcinoma diagnosed on biopsy of the supraclavicular lymph node. Discussed in tumor board and a small cell component has been ruled out.  PSA at the time of diagnosis at 2400.    Currently on Lupron 45 mg every 6 months. On Zytiga 1,062m daily + prednisone 5 mg daily per LATTITUDE.   He has continued to have a radiologic and PSA response with CT 8/5 Bone scan remain stable.  CBC, CMP, PSA monthly in [facility]- orders given and see me every other month  Lupron q6 months - next dose 06/18/18     R lung adenocarcinoma, T3N2MX stage IIIB- Parietal pleural involvement- PD-L1 99%  S/p  RLL lobectomy on 10/5  Very high risk of recurrence  Case was discussed in tumor board and the consensus was to offer PORT with N2 disease  PORT with Dr. Leron Croak Completed  09/18/17.     As regards adjuvant chemotherapy- we elected to not pursue this with another life limiting illness. We decided to consider immunotherapy with any evidence of recurrence.    Repeat imaging shows no recurrence AS OF 03/22/18.     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 Gradually improving and will continue to follow    He will have labs monthly [facility] , see me every other month, repeat scans in Nov 2019      RJuliene PinaMD, [facility]

## 2018-03-24 NOTE — Progress Notes (Signed)
 Bruce Clark is a 67 y.o. male  Chief Complaint   Patient presents with   . Follow-up   . Prostate Cancer     1. Have you been to the ER, urgent care clinic since your last visit?  Hospitalized since your last visit?No    2. Have you seen or consulted any other health care providers outside of the [facility] since your last visit?  Include any pap smears or colon screening. No     Patient needs prescription for pain medicine

## 2018-03-24 NOTE — Progress Notes (Signed)
 [facility] Lab Visit:          0930  Pt arrived ambulatory to [facility] in stable condition. No new concerns voiced. Labs drawn from right hand and sent for processing. Tolerated well. D/c [facility] ambulatory and in no distress. Pt aware of next [facility] appointment scheduled.            Visit Vitals  BP 122/78 (BP 1 Location: Left arm, BP Patient Position: Sitting)   Pulse 88   Temp 98.6 F (37 C)   Resp 20   SpO2 97%       Labs available in CC once resulted.

## 2018-03-24 NOTE — Progress Notes (Signed)
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 male who is  on Eliquis for a h/o DVT who presented to the Emergency Department on 04/22/16 with nausea, vomiting, and abdominal pain x 2 months, decreased appetite and weight loss of 10-14 lbs.   CTA revealed bilateral lung nodules and mediastinal/hilar adenopathy. PET revealed supraclavicular adenopathy, retroperitoneal adenopathy,  bilateral iliac adenopathy, mediastinal/hilar adenopathy, bony metastatic disease, right lower lobe pulmonary mass, and multiple other smaller masses in bilateral lungs. USG guided bx of supraclavicular  LN showed adenocarcinoma of the prostate. He was also diagnosed and treated for CAP. PSA on 05/26/16-2400. Was discussed in tumor board, Napsin A negative and a small cell component ruled out. Initiated Degarelix  05/26/16. MRI spine with expansile C7 lesion,  T12 lesion encroaching epidural space, S1-S2 lesion encroaching neural foramina. Started palliative C6-T5 and T11-L5 XRT under Dr. Leron Croak on 06/09/16. Started Zytiga 08/28/16 with evidence of response. Noted to have a  Left lower lobe lung nodule on  CT done 12/22/16. Biopsy consistent with Lung adenocarcinoma. He had a mediastinoscopy with LN being negative for metastatic disease. Robotic RLL lobectomy performed on 05/22/17.       He underwent a robotic right lower lobectomy on 05/22/17 with Dr. Glo Herring. Pathology showed adenocarcinoma T3N2 stage IIIB. Case discussed at tumor board and there is no plan for adjuvant chemotherapy at this  time. He completed post op radiation with  Dr. Leron Croak on 09/18/17.       Comes in today for follow-up. Had scans.    Cough still present but improved.  Continues to stay active and walk daily. No fevers/chills. He reports his right sided chest pain comes and goes, and tylenol does not help.   No nausea/vomiting. Denies diarrhea/constipation. No mouth sores. Continues to take Zytiga + Prednisone daily. Denies bleeding on Eliquis.       Otherwise, complete ROS is per the symptom report form which has been scanned into the media section of the electronic medical record.        Past Medical History:        Diagnosis  Date         ?  Lung cancer ([place])       ?  Prostate cancer ([place])       ?  Stroke [facility])            PT STATES, "I HAD A STROKE A LONG TIME AGO"             Past Surgical History:         Procedure  Laterality  Date          ?  CHEST SURGERY PROCEDURE UNLISTED    04/27/2017  BRONCHOSCOPY/MEDIASTINOSCOPY          ?  HX GI              HERNIA REPAIR          ?  HX ORTHOPAEDIC    1994          ROD IN RIGHT LEG           No Known Allergies        Current Outpatient Medications          Medication  Sig  Dispense  Refill           ?  magnesium hydroxide (PHILLIPS MILK OF MAGNESIA) 400 mg/5 mL suspension  Take 30 mL by mouth daily as needed for Constipation.         ?  predniSONE (DELTASONE) 5 mg tablet  Take 1 Tab by mouth daily. Restart on Jun 01, 2017  30 Tab  6     ?  apixaban (ELIQUIS) 5 mg tablet  Take 1 Tab by mouth two (2) times a day.  14 Tab  0     ?  abiraterone (ZYTIGA) 500 mg tab  Take 1,000 mg by mouth daily.  60 Tab  3     ?  morphine 10 mg/5 mL oral solution  Take 2.5 mL by mouth every four (4) hours as needed for Pain. Max Daily Amount: 30 mg.  500 mL  0     ?  magic mouthwash solution  Magic mouth wash    Maalox   Lidocaine 2% viscous    Diphenhydramine oral solution    Nystatin 100,000 units      Pharmacy to mix equal portions of ingredients to a total volume as indicated in the dispense  amount.   Swish and spit 39m (1 tsp) four times daily as needed for mouth sores  480 mL  3     ?  senna (SENOKOT) 8.6 mg tablet  Take 1 Tab by mouth nightly. Indications: constipation  30 Tab  0           ?  polyethylene glycol (MIRALAX) 17 gram packet  Take 1 Packet by mouth daily.  30 Packet  2             Social History          Socioeconomic History         ?  Marital status:  SINGLE              Spouse name:  Not on file         ?  Number of children:  Not on file     ?  Years of education:  Not on file     ?  Highest education level:  Not on file       Tobacco Use         ?  Smoking status:  Former Smoker              Packs/day:  0.50         Years:  47.00         Pack years:  23.50         Last attempt to quit:  06/2016         Years since quitting:  1.7         ?  Smokeless tobacco:  Never Used       Substance and Sexual Activity         ?  Alcohol use:  No         ?  Drug use:  No             Family History         Problem  Relation  Age of Onset          ?  Cancer  Mother                COLON          ?  Cancer  Father                BRAIN TUMOR          ?  No Known Problems  Sister       ?  Arthritis-osteo  Brother       ?  No Known Problems  Sister       ?  No Known Problems  Sister       ?  No Known Problems  Sister       ?  No Known Problems  Brother            ?  Anesth Problems  Neg Hx          ROS   A review of systems was obtained and is negative except as listed in HPI.   ECOG PS is 1      Physical Examination:    Visit Vitals      BP  125/78     Pulse  83     Temp  98 ??F (36.7 ??C) (Oral)     Resp  18     Ht  5' 7" (1.702 m)     Wt  140 lb 12.8 oz (63.9 kg)     SpO2  97%        BMI  22.05 kg/m??        General appearance - alert, and in no distress, appears fatigued   Mental status - oriented to person, place, and time   Mouth - mucous membranes moist, no lesions noted   Neck - supple, no significant adenopathy   Resp-CTA, RLL diminished, diminished, normal respiratory effort   ABD- soft, non  tender, active bowel sounds   Neurological - normal speech, no focal findings or movement disorder noted   Musculoskeletal - no joint tenderness, deformity or swelling   Extremities - peripheral pulses normal, no pedal edema, no clubbing or cyanosis   Skin - robotic incision site and previous chest tube site on right side of chest appear to be healing appropriately      LABS     Lab Results         Component  Value  Date/Time            WBC  6.5  02/17/2018 08:51 AM       HGB  10.1 (L)  02/17/2018 08:51 AM       HCT  32.1 (L)  02/17/2018 08:51 AM       PLATELET  202  02/17/2018 08:51 AM       MCV  87.5  02/17/2018 08:51 AM            ABS. NEUTROPHILS  4.9  02/17/2018 08:51 AM          Lab Results         Component  Value  Date/Time            Sodium  138  02/17/2018 08:51 AM       Potassium  4.0  02/17/2018 08:51 AM       Chloride  106  02/17/2018 08:51 AM       CO2  27  02/17/2018 08:51 AM       Glucose  131 (H)  02/17/2018 08:51 AM       BUN  9  02/17/2018 08:51 AM       Creatinine  0.64 (L)  02/17/2018 08:51 AM       GFR est AA  >60  02/17/2018 08:51 AM       GFR est non-AA  >60  02/17/2018 08:51 AM            Calcium  8.9  02/17/2018 08:51 AM          Lab Results         Component  Value  Date/Time            AST (SGOT)  12 (L)  02/17/2018 08:51 AM       Alk. phosphatase  80  02/17/2018 08:51 AM       Protein, total  7.0  02/17/2018 08:51 AM       Albumin  3.2 (L)  02/17/2018 08:51 AM       Globulin  3.8  02/17/2018 08:51 AM            A-G Ratio  0.8 (L)  02/17/2018 08:51 AM          Recent Labs                   02/17/18   0851  01/01/18   0811  11/27/17   0806  10/16/17   0759  10/06/17   1037  09/15/17   0909  09/01/17   0839  08/25/17   0959  07/13/17   0753                PSA  0.1  0.1  0.1  0.1  0.1  0.1  0.1  0.1  0.1           IMAGING   PET CT  03/23/17   IMPRESSION: Mass lesion right lower lobe is hypermetabolic and compatible with   the biopsy confirmed the result of adenocarcinoma. There are soft  tissue   densities in the anterior abdominal wall bilaterally which are stable compared   to the prior chest abdomen pelvis CT but new compared to the prior PET/CT.   Please correlate with any recent abdominal wall surgery as these may represent   port sites. Otherwise normal tracer distribution. Stable sclerotic osseous   metastatic disease without abnormal activity.      PATHOLOGY   Supraclavicular node    CYTOLOGIC INTERPRETATION:    Adenocarcinoma, consistent with prostate primary    See comment    General Categorization    Positive for malignancy.    Specimen Adequacy    Satisfactory for evaluation.    Comment    Touch preps and a core biopsy are examined and show islands of adenocarcinoma surrounded by fibrous stroma. A panel of immunohistochemical stains was performed  to evaluate site of origin. The tumor cells are focally positive for PSA and PSAP and negative for CK7, CK20, TTF-1      CT CAP 03/22/18   IMPRESSION   IMPRESSION:    1. Status post right lung surgery with right perihilar consolidation and   scarring consistent with post surgery  and radiation therapy change. These   findings are stable.   2. Enlarged prostate.   3. Sclerotic bone metastases unchanged.   4. Atherosclerotic aorta without aneurysm         ASSESSMENT   Mr. Spraggins is a  67 y.o. male with widely metastatic prostate adenocarcinoma and newly diagnosed lung adenocarcinoma  status post right lower lobe lobectomy on 05/22/17 and presents today for follow-up.       PLAN      Prostate cancer   Castrate sensitive, treatment na??ve widely metastatic prostate cancer to lymph nodes above and below the diaphragm and bones.   Prostate adenocarcinoma diagnosed on biopsy of the supraclavicular lymph node. Discussed in tumor board and a small cell component has been ruled out.   PSA at the time of diagnosis at 2400.      Currently on Lupron 45 mg every 6 months. On Zytiga 1,055m daily + prednisone 5 mg daily per LATTITUDE.    He has continued to have  a radiologic and PSA response with CT 8/5 Bone scan remain stable.   CBC, CMP, PSA monthly in [facility]- orders given and see me every other month   Lupron q6 months - next dose 06/18/18       R lung adenocarcinoma, T3N2MX stage IIIB- Parietal pleural involvement-  PD-L1 99%   S/p RLL lobectomy on 10/5   Very high risk of recurrence   Case was discussed in tumor board and the consensus was to offer PORT with N2 disease   PORT with Dr. PLeron CroakCompleted  09/18/17.       As regards adjuvant chemotherapy- we elected to not pursue this with another life limiting illness. We decided to consider immunotherapy with any evidence of recurrence.      Repeat imaging shows no recurrence AS OF 03/22/18.       Bone metastases   Diffuse   Completed palliative radiation that was initiated on 06/09/16   Hold off biphosphonates due to poor dental hygiene   Stable appearance on most recent scans on 03/22/18      DVT   Diagnosed 04/22/16   Cancer associated   On lifelong Eliquis 58mBID       Right sided chest wall pain   Over laparoscopic incision sites   Bone scan done shows stable pattern of osseous metastatic disease    Will continue Tramadol prn      Anemia   Was noted to have a new anemia of Hgb 8.4g/dL on 10/06/17. Gradually improving and will continue to follow      He will have labs monthly in [facility] , see me every other month, repeat scans in Nov 2019         RaJuliene PinaD, MSLestervillencology associates

## 2018-05-11 ENCOUNTER — Encounter: Attending: Internal Medicine | Primary: Internal Medicine

## 2018-05-11 ENCOUNTER — Ambulatory Visit
Admit: 2018-05-11 | Discharge: 2018-05-11 | Payer: PRIVATE HEALTH INSURANCE | Attending: Registered Nurse | Primary: Internal Medicine

## 2018-05-11 ENCOUNTER — Ambulatory Visit: Attending: Registered Nurse | Primary: Internal Medicine

## 2018-05-11 DIAGNOSIS — C61 Malignant neoplasm of prostate: Secondary | ICD-10-CM

## 2018-05-11 NOTE — Progress Notes (Signed)
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 male who is on Eliquis for a h/o DVT who presented to the Emergency Department on 04/22/16 with nausea, vomiting, and abdominal pain x 2 months, decreased appetite and weight loss of 10-14 lbs.  CTA revealed bilateral lung nodules and mediastinal/hilar adenopathy. PET revealed supraclavicular adenopathy, retroperitoneal adenopathy, bilateral iliac adenopathy, mediastinal/hilar adenopathy, bony metastatic disease, right lower lobe pulmonary mass, and multiple other smaller masses in bilateral lungs. USG guided bx of supraclavicular LN showed adenocarcinoma of the prostate. He was also diagnosed and treated for CAP. PSA on 05/26/16-2400. Was discussed in tumor board, Napsin A negative and a small cell component ruled out. Initiated Degarelix  05/26/16. MRI spine with expansile C7 lesion, T12 lesion encroaching epidural space, S1-S2 lesion encroaching neural foramina. Started palliative C6-T5 and T11-L5 XRT under Dr. Leron Croak on 06/09/16. Started Zytiga 08/28/16 with evidence of response. Noted to have a  Left lower lobe lung nodule on CT done 12/22/16. Biopsy consistent with Lung adenocarcinoma. He had a mediastinoscopy with LN being negative for metastatic disease. Robotic RLL lobectomy performed on 05/22/17.     He underwent a robotic right lower lobectomy on 05/22/17 with Dr. Glo Herring. Pathology showed adenocarcinoma T3N2 stage IIIB. Case discussed at tumor board and there is no plan for adjuvant chemotherapy at this time. He completed post op radiation with Dr. Leron Croak on 09/18/17.     Comes in today for follow-up. He feels well. Cough is at baseline. Reports SOB with moderate exertion which is stable. Continues to stay active and walk daily. No fevers/chills. He reports his right sided chest pain comes  and goes. Denies nausea/vomiting. Denies diarrhea/constipation. No mouth sores. Continues to take Zytiga + Prednisone daily. Denies bleeding on Eliquis.     Otherwise, complete ROS is per the symptom report form which has been scanned into the media section of the electronic medical record.    Past Medical History:   Diagnosis Date   ??? Lung cancer ([place])    ??? Prostate cancer ([place])    ??? Stroke ([place])     PT STATES, "I HAD A STROKE A LONG TIME AGO"       Past Surgical History:   Procedure Laterality Date   ??? CHEST SURGERY PROCEDURE UNLISTED  04/27/2017    BRONCHOSCOPY/MEDIASTINOSCOPY   ??? HX GI      HERNIA REPAIR   ??? HX ORTHOPAEDIC  1994    ROD IN RIGHT LEG       No Known Allergies    Current Outpatient Medications   Medication Sig Dispense Refill   ??? magnesium hydroxide (PHILLIPS MILK OF MAGNESIA) 400 mg/5 mL suspension Take 30 mL by mouth daily as needed for Constipation.     ??? predniSONE (DELTASONE) 5 mg tablet Take 1 Tab by mouth daily. Restart on Jun 01, 2017 30 Tab 6   ??? apixaban (ELIQUIS) 5 mg tablet Take 1 Tab by mouth two (2) times a day. 14 Tab 0   ??? abiraterone (ZYTIGA) 500 mg tab Take 1,000 mg by mouth daily. 60 Tab 3   ??? polyethylene glycol (MIRALAX) 17 gram packet Take 1 Packet by mouth daily. 30 Packet 2   ??? magic mouthwash solution Magic mouth wash   Maalox  Lidocaine 2% viscous   Diphenhydramine oral solution  Nystatin 100,000 units    Pharmacy to mix equal portions of ingredients to a total volume as indicated in the dispense amount.  Swish and spit 71m (1 tsp) four times daily as needed for mouth sores 480 mL 3   ??? senna (SENOKOT) 8.6 mg tablet Take 1 Tab by mouth nightly. Indications: constipation 30 Tab 0       Social History     Socioeconomic History   ??? Marital status: SINGLE     Spouse name: Not on file   ??? Number of children: Not on file   ??? Years of education: Not on file   ??? Highest education level: Not on file   Tobacco Use   ??? Smoking status: Former Smoker     Packs/day: 0.50      Years: 47.00     Pack years: 23.50     Last attempt to quit: 06/2016     Years since quitting: 1.8   ??? Smokeless tobacco: Never Used   Substance and Sexual Activity   ??? Alcohol use: No   ??? Drug use: No       Family History   Problem Relation Age of Onset   ??? Cancer Mother         COLON   ??? Cancer Father         BRAIN TUMOR   ??? No Known Problems Sister    ??? Arthritis-osteo Brother    ??? No Known Problems Sister    ??? No Known Problems Sister    ??? No Known Problems Sister    ??? No Known Problems Brother    ??? Anesth Problems Neg Hx      ROS  A review of systems was obtained and is negative except as listed in HPI.  ECOG PS is 1    Physical Examination:   Visit Vitals  BP 116/59   Pulse 92   Temp 98.4 ??F (36.9 ??C)   Ht 5' 7"  (1.702 m)   Wt 137 lb (62.1 kg)   SpO2 96%   BMI 21.46 kg/m??     General appearance - alert, and in no distress, appears fatigued  Mental status - oriented to person, place, and time  Mouth - mucous membranes moist, no lesions noted  Neck - supple, no significant adenopathy  Resp-CTA, RLL diminished, diminished, normal respiratory effort  ABD- soft, non tender, active bowel sounds  Neurological - normal speech, no focal findings or movement disorder noted  Musculoskeletal - no joint tenderness, deformity or swelling  Extremities - peripheral pulses normal, no pedal edema, no clubbing or cyanosis  Skin - robotic incision site and previous chest tube site on right side of chest appear to be healing appropriately    LABS  Lab Results   Component Value Date/Time    WBC 7.6 03/24/2018 09:49 AM    HGB 10.6 (L) 03/24/2018 09:49 AM    HCT 33.1 (L) 03/24/2018 09:49 AM    PLATELET 207 03/24/2018 09:49 AM    MCV 87.3 03/24/2018 09:49 AM    ABS. NEUTROPHILS 5.9 03/24/2018 09:49 AM     Lab Results   Component Value Date/Time    Sodium 136 03/24/2018 09:49 AM    Potassium 3.7 03/24/2018 09:49 AM    Chloride 105 03/24/2018 09:49 AM    CO2 24 03/24/2018 09:49 AM    Glucose 83 03/24/2018 09:49 AM     BUN 11 03/24/2018 09:49 AM    Creatinine 0.65 (L) 03/24/2018 09:49  AM    GFR est AA >60 03/24/2018 09:49 AM    GFR est non-AA >60 03/24/2018 09:49 AM    Calcium 9.5 03/24/2018 09:49 AM     Lab Results   Component Value Date/Time    AST (SGOT) 18 03/24/2018 09:49 AM    Alk. phosphatase 86 03/24/2018 09:49 AM    Protein, total 8.2 03/24/2018 09:49 AM    Albumin 3.4 (L) 03/24/2018 09:49 AM    Globulin 4.8 (H) 03/24/2018 09:49 AM    A-G Ratio 0.7 (L) 03/24/2018 09:49 AM     Recent Labs     03/24/18  0949 02/17/18  0851 01/01/18  0811 11/27/17  0806 10/16/17  0759 10/06/17  1037 09/15/17  0909 09/01/17  0839 08/25/17  0959   PSA 0.1 0.1 0.1 0.1 0.1 0.1 0.1 0.1 0.1       IMAGING  PET CT  03/23/17  IMPRESSION: Mass lesion right lower lobe is hypermetabolic and compatible with  the biopsy confirmed the result of adenocarcinoma. There are soft tissue  densities in the anterior abdominal wall bilaterally which are stable compared  to the prior chest abdomen pelvis CT but new compared to the prior PET/CT.  Please correlate with any recent abdominal wall surgery as these may represent  port sites. Otherwise normal tracer distribution. Stable sclerotic osseous  metastatic disease without abnormal activity.    PATHOLOGY  Supraclavicular node   CYTOLOGIC INTERPRETATION:   Adenocarcinoma, consistent with prostate primary   See comment   General Categorization   Positive for malignancy.   Specimen Adequacy   Satisfactory for evaluation.   Comment   Touch preps and a core biopsy are examined and show islands of adenocarcinoma surrounded by fibrous stroma. A panel of immunohistochemical stains was performed to evaluate site of origin. The tumor cells are focally positive for PSA and PSAP and negative for CK7, CK20, TTF-1    CT CAP 03/22/18  IMPRESSION  IMPRESSION:   1. Status post right lung surgery with right perihilar consolidation and  scarring consistent with post surgery and radiation therapy change. These  findings are stable.   2. Enlarged prostate.  3. Sclerotic bone metastases unchanged.  4. Atherosclerotic aorta without aneurysm      ASSESSMENT  Mr. Jeffus is a 67 y.o. male with widely metastatic prostate adenocarcinoma and newly diagnosed lung adenocarcinoma status post right lower lobe lobectomy on 05/22/17 and presents today for follow-up.     PLAN    Prostate cancer  Castrate sensitive, treatment na??ve widely metastatic prostate cancer to lymph nodes above and below the diaphragm and bones.  Prostate adenocarcinoma diagnosed on biopsy of the supraclavicular lymph node. Discussed in tumor board and a small cell component has been ruled out.  PSA at the time of diagnosis at 2400.    Currently on Lupron 45 mg every 6 months. On Zytiga 1,040m daily + prednisone 5 mg daily per LATTITUDE.   He has continued to have a radiologic and PSA response with CT 8/5 Bone scan remain stable.  CBC, CMP, PSA monthly [facility]- orders given and see me every other month  Lupron q6 months - next dose 06/18/18     R lung adenocarcinoma, T3N2MX stage IIIB- Parietal pleural involvement- PD-L1 99%  S/p RLL lobectomy on 10/5  Very high risk of recurrence  Case was discussed in tumor board and the consensus was to offer PORT with N2 disease  PORT with Dr. PLeron CroakCompleted  09/18/17.  As regards adjuvant chemotherapy- we elected to not pursue this with another life limiting illness. We decided to consider immunotherapy with any evidence of recurrence.    Repeat imaging shows no recurrence AS OF 03/22/18. Repeat imaging due November 2019.     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 Gradually improving and will continue to follow    He will have [facility] , see me every other month, repeat scans in Nov 2019 which have been ordered      RJuliene PinaMD, [facility]

## 2018-05-11 NOTE — Progress Notes (Signed)
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 male who is  on Eliquis for a h/o DVT who presented to the Emergency Department on 04/22/16 with nausea, vomiting, and abdominal pain x 2 months, decreased appetite and weight loss of 10-14 lbs.   CTA revealed bilateral lung nodules and mediastinal/hilar adenopathy. PET revealed supraclavicular adenopathy, retroperitoneal adenopathy,  bilateral iliac adenopathy, mediastinal/hilar adenopathy, bony metastatic disease, right lower lobe pulmonary mass, and multiple other smaller masses in bilateral lungs. USG guided bx of supraclavicular  LN showed adenocarcinoma of the prostate. He was also diagnosed and treated for CAP. PSA on 05/26/16-2400. Was discussed in tumor board, Napsin A negative and a small cell component ruled out. Initiated Degarelix  05/26/16. MRI spine with expansile C7 lesion,  T12 lesion encroaching epidural space, S1-S2 lesion encroaching neural foramina. Started palliative C6-T5 and T11-L5 XRT under Dr. Leron Croak on 06/09/16. Started Zytiga 08/28/16 with evidence of response. Noted to have a  Left lower lobe lung nodule on  CT done 12/22/16. Biopsy consistent with Lung adenocarcinoma. He had a mediastinoscopy with LN being negative for metastatic disease. Robotic RLL lobectomy performed on 05/22/17.       He underwent a robotic right lower lobectomy on 05/22/17 with Dr. Glo Herring. Pathology showed adenocarcinoma T3N2 stage IIIB. Case discussed at tumor board and there is no plan for adjuvant chemotherapy at this  time. He completed post op radiation with  Dr. Leron Croak on 09/18/17.       Comes in today for follow-up. He feels well. Cough is at baseline. Reports SOB with moderate exertion which is stable. Continues to stay active and walk daily. No fevers/chills. He reports his right sided chest pain comes and goes. Denies nausea/vomiting.  Denies diarrhea/constipation. No mouth sores. Continues to take Zytiga + Prednisone daily. Denies bleeding on Eliquis.       Otherwise, complete ROS is per the symptom report form which has been scanned into the media section of the electronic medical record.        Past Medical History:        Diagnosis  Date         ?  Lung cancer ([name])       ?  Prostate cancer ([name])       ?  Stroke [name])            PT STATES, "I HAD A STROKE A LONG TIME AGO"             Past Surgical History:         Procedure  Laterality  Date          ?  CHEST SURGERY PROCEDURE UNLISTED    04/27/2017  BRONCHOSCOPY/MEDIASTINOSCOPY          ?  HX GI              HERNIA REPAIR          ?  HX ORTHOPAEDIC    1994          ROD IN RIGHT LEG           No Known Allergies        Current Outpatient Medications          Medication  Sig  Dispense  Refill           ?  magnesium hydroxide (PHILLIPS MILK OF MAGNESIA) 400 mg/5 mL suspension  Take 30 mL by mouth daily as needed for Constipation.         ?  predniSONE (DELTASONE) 5 mg tablet  Take 1 Tab by mouth daily. Restart on Jun 01, 2017  30 Tab  6     ?  apixaban (ELIQUIS) 5 mg tablet  Take 1 Tab by mouth two (2) times a day.  14 Tab  0     ?  abiraterone (ZYTIGA) 500 mg tab  Take 1,000 mg by mouth daily.  60 Tab  3     ?  polyethylene glycol (MIRALAX) 17 gram packet  Take 1 Packet by mouth daily.  30 Packet  2     ?  magic mouthwash solution  Magic mouth wash    Maalox   Lidocaine 2% viscous    Diphenhydramine oral solution    Nystatin 100,000 units      Pharmacy to mix equal portions of ingredients to a total volume as indicated in the dispense amount.   Swish and spit  70m (1 tsp) four times daily as needed for mouth sores  480 mL  3           ?  senna (SENOKOT) 8.6 mg tablet  Take 1 Tab by mouth nightly. Indications: constipation  30 Tab  0             Social History          Socioeconomic History         ?  Marital status:  SINGLE              Spouse name:  Not on file         ?  Number of children:  Not on file     ?  Years of education:  Not on file     ?  Highest education level:  Not on file       Tobacco Use         ?  Smoking status:  Former Smoker              Packs/day:  0.50         Years:  47.00         Pack years:  23.50         Last attempt to quit:  06/2016         Years since quitting:  1.8         ?  Smokeless tobacco:  Never Used       Substance and Sexual Activity         ?  Alcohol use:  No         ?  Drug use:  No  Family History         Problem  Relation  Age of Onset          ?  Cancer  Mother                COLON          ?  Cancer  Father                BRAIN TUMOR          ?  No Known Problems  Sister       ?  Arthritis-osteo  Brother       ?  No Known Problems  Sister       ?  No Known Problems  Sister       ?  No Known Problems  Sister       ?  No Known Problems  Brother            ?  Anesth Problems  Neg Hx          ROS   A review of systems was obtained and is negative except as listed in HPI.   ECOG PS is 1      Physical Examination:    Visit Vitals      BP  116/59     Pulse  92     Temp  98.4 ??F (36.9 ??C)     Ht  '5\' 7"'  (1.702 m)     Wt  137 lb (62.1 kg)     SpO2  96%        BMI  21.46 kg/m??        General appearance - alert, and in no distress, appears fatigued   Mental status - oriented to person, place, and time   Mouth - mucous membranes moist, no lesions noted   Neck - supple, no significant adenopathy   Resp-CTA, RLL diminished, diminished, normal respiratory effort   ABD- soft, non tender, active bowel sounds   Neurological - normal speech, no focal findings or movement disorder noted   Musculoskeletal - no joint tenderness,  deformity or swelling   Extremities - peripheral pulses normal, no pedal edema, no clubbing or cyanosis   Skin - robotic incision site and previous chest tube site on right side of chest appear to be healing appropriately      LABS     Lab Results         Component  Value  Date/Time            WBC  7.6  03/24/2018 09:49 AM       HGB  10.6 (L)  03/24/2018 09:49 AM       HCT  33.1 (L)  03/24/2018 09:49 AM       PLATELET  207  03/24/2018 09:49 AM       MCV  87.3  03/24/2018 09:49 AM            ABS. NEUTROPHILS  5.9  03/24/2018 09:49 AM          Lab Results         Component  Value  Date/Time            Sodium  136  03/24/2018 09:49 AM       Potassium  3.7  03/24/2018 09:49 AM       Chloride  105  03/24/2018 09:49 AM       CO2  24  03/24/2018 09:49 AM       Glucose  83  03/24/2018 09:49 AM       BUN  11  03/24/2018 09:49 AM       Creatinine  0.65 (L)  03/24/2018 09:49 AM       GFR est AA  >60  03/24/2018 09:49 AM       GFR est non-AA  >60  03/24/2018 09:49 AM            Calcium  9.5  03/24/2018 09:49 AM          Lab Results         Component  Value  Date/Time            AST (SGOT)  18  03/24/2018 09:49 AM       Alk. phosphatase  86  03/24/2018 09:49 AM       Protein, total  8.2  03/24/2018 09:49 AM       Albumin  3.4 (L)  03/24/2018 09:49 AM       Globulin  4.8 (H)  03/24/2018 09:49 AM            A-G Ratio  0.7 (L)  03/24/2018 09:49 AM          Recent Labs                   03/24/18   0949  02/17/18   0851  01/01/18   0811  11/27/17   0806  10/16/17   0759  10/06/17   1037  09/15/17   0909  09/01/17   0839  08/25/17   0959                PSA  0.1  0.1  0.1  0.1  0.1  0.1  0.1  0.1  0.1           IMAGING   PET CT  03/23/17   IMPRESSION: Mass lesion right lower lobe is hypermetabolic and compatible with   the biopsy confirmed the result of adenocarcinoma. There are soft tissue   densities in the anterior abdominal wall bilaterally which are stable compared   to the prior chest abdomen pelvis CT but new compared to the prior  PET/CT.   Please correlate with any recent abdominal wall surgery as these may represent   port sites. Otherwise normal tracer distribution. Stable sclerotic osseous   metastatic disease without abnormal activity.      PATHOLOGY   Supraclavicular node    CYTOLOGIC INTERPRETATION:    Adenocarcinoma, consistent with prostate primary    See comment    General Categorization    Positive for malignancy.    Specimen Adequacy    Satisfactory for evaluation.    Comment    Touch preps and a core biopsy are examined and show islands of adenocarcinoma surrounded by fibrous stroma. A panel of immunohistochemical stains was performed  to evaluate site of origin. The tumor cells are focally positive for PSA and PSAP and negative for CK7, CK20, TTF-1      CT CAP 03/22/18   IMPRESSION   IMPRESSION:    1. Status post right lung surgery with right perihilar consolidation and   scarring consistent with post surgery and radiation therapy change. These   findings are stable.   2. Enlarged prostate.   3. Sclerotic bone metastases unchanged.   4. Atherosclerotic aorta without aneurysm         ASSESSMENT  Bruce Clark is a  67 y.o. male with widely metastatic prostate adenocarcinoma and newly diagnosed lung adenocarcinoma  status post right lower lobe lobectomy on 05/22/17 and presents today for follow-up.       PLAN      Prostate cancer   Castrate sensitive, treatment na??ve widely metastatic prostate cancer to lymph nodes above and below the diaphragm and bones.   Prostate adenocarcinoma diagnosed on biopsy of the supraclavicular lymph node. Discussed in tumor board and a small cell component has been ruled out.   PSA at the time of diagnosis at 2400.      Currently on Lupron 45 mg every 6 months. On Zytiga 1,087m daily + prednisone 5 mg daily per LATTITUDE.    He has continued to have a radiologic and PSA response with CT 8/5 Bone scan remain stable.   CBC, CMP, PSA monthly in [facility]- orders given and see me every other month   Lupron  q6 months - next dose 06/18/18       R lung adenocarcinoma, T3N2MX stage IIIB- Parietal pleural involvement-  PD-L1 99%   S/p RLL lobectomy on 10/5   Very high risk of recurrence   Case was discussed in tumor board and the consensus was to offer PORT with N2 disease   PORT with Dr. PLeron CroakCompleted  09/18/17.       As regards adjuvant chemotherapy- we elected to not pursue this with another life limiting illness. We decided to consider immunotherapy with any evidence of recurrence.      Repeat imaging shows no recurrence AS OF 03/22/18. Repeat imaging due November 2019.       Bone metastases   Diffuse   Completed palliative radiation that was initiated on 06/09/16   Hold off biphosphonates due to poor dental hygiene   Stable appearance on most recent scans on 03/22/18      DVT   Diagnosed 04/22/16   Cancer associated   On lifelong Eliquis 59mBID       Right sided chest wall pain   Over laparoscopic incision sites   Bone scan done shows stable pattern of osseous metastatic disease    Will continue Tramadol prn      Anemia   Was noted to have a new anemia of Hgb 8.4g/dL on 10/06/17. Gradually improving and will continue to follow      He will have labs monthly [facility] , see me every other month, repeat scans in Nov 2019 which have been ordered         RaJuliene PinaD, MSC[facility]

## 2018-05-12 LAB — CBC WITH AUTO DIFFERENTIAL
Basophils %: 0 %
Basophils Absolute: 0 10*3/uL (ref 0.0–0.2)
Eosinophils %: 4 %
Eosinophils Absolute: 0.2 10*3/uL (ref 0.0–0.4)
Granulocyte Absolute Count: 0 10*3/uL (ref 0.0–0.1)
Hematocrit: 35.9 % — ABNORMAL LOW (ref 37.5–51.0)
Hemoglobin: 12.1 g/dL — ABNORMAL LOW (ref 13.0–17.7)
Immature Granulocytes: 0 %
Lymphocytes %: 19 %
Lymphocytes Absolute: 1 10*3/uL (ref 0.7–3.1)
MCH: 28.5 pg (ref 26.6–33.0)
MCHC: 33.7 g/dL (ref 31.5–35.7)
MCV: 85 fL (ref 79–97)
Monocytes %: 8 %
Monocytes Absolute: 0.4 10*3/uL (ref 0.1–0.9)
Neutrophils %: 69 %
Neutrophils Absolute: 3.7 10*3/uL (ref 1.4–7.0)
Platelets: 253 10*3/uL (ref 150–450)
RBC: 4.25 x10E6/uL (ref 4.14–5.80)
RDW: 13 % (ref 12.3–15.4)
WBC: 5.3 10*3/uL (ref 3.4–10.8)

## 2018-05-12 LAB — COMPREHENSIVE METABOLIC PANEL
ALT: 5 IU/L (ref 0–44)
AST: 14 IU/L (ref 0–40)
Albumin/Globulin Ratio: 1.4 NA (ref 1.2–2.2)
Albumin: 4.4 g/dL (ref 3.6–4.8)
Alkaline Phosphatase: 88 IU/L (ref 39–117)
BUN: 11 mg/dL (ref 8–27)
Bun/Cre Ratio: 14 NA (ref 10–24)
CO2: 22 mmol/L (ref 20–29)
Calcium: 10 mg/dL (ref 8.6–10.2)
Chloride: 100 mmol/L (ref 96–106)
Creatinine: 0.78 mg/dL (ref 0.76–1.27)
EGFR IF NonAfrican American: 95 mL/min/{1.73_m2} (ref >59)
GFR African American: 110 mL/min/{1.73_m2} (ref >59)
Globulin, Total: 3.2 g/dL (ref 1.5–4.5)
Glucose: 92 mg/dL (ref 65–99)
Potassium: 4.1 mmol/L (ref 3.5–5.2)
Sodium: 138 mmol/L (ref 134–144)
Total Bilirubin: 0.4 mg/dL (ref 0.0–1.2)
Total Protein: 7.6 g/dL (ref 6.0–8.5)

## 2018-05-12 LAB — PSA PROSTATIC SPECIFIC ANTIGEN: PSA: <0.1 ng/mL (ref 0.0–4.0)

## 2018-05-12 LAB — CBC WITH AUTOMATED DIFF
ABS. BASOPHILS: 0 10*3/uL (ref 0.0–0.2)
ABS. EOSINOPHILS: 0.2 10*3/uL (ref 0.0–0.4)
ABS. IMM. GRANS.: 0 10*3/uL (ref 0.0–0.1)
ABS. MONOCYTES: 0.4 10*3/uL (ref 0.1–0.9)
ABS. NEUTROPHILS: 3.7 10*3/uL (ref 1.4–7.0)
Abs Lymphocytes: 1 10*3/uL (ref 0.7–3.1)
BASOPHILS: 0 %
EOSINOPHILS: 4 %
HCT: 35.9 % — ABNORMAL LOW (ref 37.5–51.0)
HGB: 12.1 g/dL — ABNORMAL LOW (ref 13.0–17.7)
IMMATURE GRANULOCYTES: 0 %
Lymphocytes: 19 %
MCH: 28.5 pg (ref 26.6–33.0)
MCHC: 33.7 g/dL (ref 31.5–35.7)
MCV: 85 fL (ref 79–97)
MONOCYTES: 8 %
NEUTROPHILS: 69 %
PLATELET: 253 10*3/uL (ref 150–450)
RBC: 4.25 x10E6/uL (ref 4.14–5.80)
RDW: 13 % (ref 12.3–15.4)
WBC: 5.3 10*3/uL (ref 3.4–10.8)

## 2018-05-12 LAB — METABOLIC PANEL, COMPREHENSIVE
A-G Ratio: 1.4 (ref 1.2–2.2)
ALT (SGPT): 5 IU/L (ref 0–44)
AST (SGOT): 14 IU/L (ref 0–40)
Albumin: 4.4 g/dL (ref 3.6–4.8)
Alk. phosphatase: 88 IU/L (ref 39–117)
BUN/Creatinine ratio: 14 (ref 10–24)
BUN: 11 mg/dL (ref 8–27)
Bilirubin, total: 0.4 mg/dL (ref 0.0–1.2)
CO2: 22 mmol/L (ref 20–29)
Calcium: 10 mg/dL (ref 8.6–10.2)
Chloride: 100 mmol/L (ref 96–106)
Creatinine: 0.78 mg/dL (ref 0.76–1.27)
GFR est AA: 110 mL/min/{1.73_m2} (ref >59)
GFR est non-AA: 95 mL/min/{1.73_m2} (ref >59)
GLOBULIN, TOTAL: 3.2 g/dL (ref 1.5–4.5)
Glucose: 92 mg/dL (ref 65–99)
Potassium: 4.1 mmol/L (ref 3.5–5.2)
Protein, total: 7.6 g/dL (ref 6.0–8.5)
Sodium: 138 mmol/L (ref 134–144)

## 2018-05-12 LAB — PSA, DIAGNOSTIC (PROSTATE SPECIFIC AG): Prostate Specific Ag: <0.1 ng/mL (ref 0.0–4.0)

## 2018-06-14 ENCOUNTER — Encounter: Payer: MEDICARE | Primary: Internal Medicine

## 2018-06-18 ENCOUNTER — Encounter: Payer: MEDICARE | Primary: Internal Medicine

## 2018-06-23 ENCOUNTER — Encounter

## 2018-06-23 MED ORDER — APIXABAN 5 MG TABLET
5 mg | ORAL_TABLET | Freq: Two times a day (BID) | ORAL | 12 refills | Status: DC
Start: 2018-06-23 — End: 2018-09-03

## 2018-07-05 ENCOUNTER — Inpatient Hospital Stay: Admit: 2018-07-05 | Payer: MEDICARE | Primary: Internal Medicine

## 2018-07-05 DIAGNOSIS — C61 Malignant neoplasm of prostate: Secondary | ICD-10-CM

## 2018-07-05 LAB — CBC WITH AUTO DIFFERENTIAL
Basophils %: 0 % (ref 0–1)
Basophils Absolute: 0 10*3/uL (ref 0.0–0.1)
Eosinophils %: 5 % (ref 0–7)
Eosinophils Absolute: 0.2 10*3/uL (ref 0.0–0.4)
Granulocyte Absolute Count: 0 10*3/uL (ref 0.00–0.04)
Hematocrit: 32.2 % — ABNORMAL LOW (ref 36.6–50.3)
Hemoglobin: 10.6 g/dL — ABNORMAL LOW (ref 12.1–17.0)
Immature Granulocytes: 0 % (ref 0.0–0.5)
Lymphocytes %: 15 % (ref 12–49)
Lymphocytes Absolute: 0.7 10*3/uL — ABNORMAL LOW (ref 0.8–3.5)
MCH: 28.5 PG (ref 26.0–34.0)
MCHC: 32.9 g/dL (ref 30.0–36.5)
MCV: 86.6 FL (ref 80.0–99.0)
MPV: 9.3 FL (ref 8.9–12.9)
Monocytes %: 14 % — ABNORMAL HIGH (ref 5–13)
Monocytes Absolute: 0.7 10*3/uL (ref 0.0–1.0)
NRBC Absolute: 0 10*3/uL (ref 0.00–0.01)
Neutrophils %: 66 % (ref 32–75)
Neutrophils Absolute: 3.2 10*3/uL (ref 1.8–8.0)
Nucleated RBCs: 0 PER 100 WBC
Platelets: 185 10*3/uL (ref 150–400)
RBC: 3.72 M/uL — ABNORMAL LOW (ref 4.10–5.70)
RDW: 12.7 % (ref 11.5–14.5)
WBC: 4.8 10*3/uL (ref 4.1–11.1)

## 2018-07-05 LAB — COMPREHENSIVE METABOLIC PANEL
ALT: 9 U/L — ABNORMAL LOW (ref 12–78)
AST: 20 U/L (ref 15–37)
Albumin/Globulin Ratio: 0.9 — ABNORMAL LOW (ref 1.1–2.2)
Albumin: 3.3 g/dL — ABNORMAL LOW (ref 3.5–5.0)
Alkaline Phosphatase: 104 U/L (ref 45–117)
Anion Gap: 7 mmol/L (ref 5–15)
BUN: 8 MG/DL (ref 6–20)
Bun/Cre Ratio: 11 — ABNORMAL LOW (ref 12–20)
CO2: 28 mmol/L (ref 21–32)
Calcium: 9 MG/DL (ref 8.5–10.1)
Chloride: 105 mmol/L (ref 97–108)
Creatinine: 0.74 MG/DL (ref 0.70–1.30)
EGFR IF NonAfrican American: >60 mL/min/{1.73_m2} (ref >60)
GFR African American: >60 mL/min/{1.73_m2} (ref >60)
Globulin: 3.8 g/dL (ref 2.0–4.0)
Glucose: 82 mg/dL (ref 65–100)
Potassium: 3.3 mmol/L — ABNORMAL LOW (ref 3.5–5.1)
Sodium: 140 mmol/L (ref 136–145)
Total Bilirubin: 0.4 MG/DL (ref 0.2–1.0)
Total Protein: 7.1 g/dL (ref 6.4–8.2)

## 2018-07-05 LAB — PSA PROSTATIC SPECIFIC ANTIGEN: Pros. Spec. Antigen: 0 ng/mL — ABNORMAL LOW (ref 0.01–4.0)

## 2018-07-05 LAB — METABOLIC PANEL, COMPREHENSIVE
A-G Ratio: 0.9 — ABNORMAL LOW (ref 1.1–2.2)
ALT (SGPT): 9 U/L — ABNORMAL LOW (ref 12–78)
AST (SGOT): 20 U/L (ref 15–37)
Albumin: 3.3 g/dL — ABNORMAL LOW (ref 3.5–5.0)
Alk. phosphatase: 104 U/L (ref 45–117)
Anion gap: 7 mmol/L (ref 5–15)
BUN/Creatinine ratio: 11 — ABNORMAL LOW (ref 12–20)
BUN: 8 MG/DL (ref 6–20)
Bilirubin, total: 0.4 MG/DL (ref 0.2–1.0)
CO2: 28 mmol/L (ref 21–32)
Calcium: 9 MG/DL (ref 8.5–10.1)
Chloride: 105 mmol/L (ref 97–108)
Creatinine: 0.74 MG/DL (ref 0.70–1.30)
GFR est AA: >60 mL/min/{1.73_m2} (ref >60)
GFR est non-AA: >60 mL/min/{1.73_m2} (ref >60)
Globulin: 3.8 g/dL (ref 2.0–4.0)
Glucose: 82 mg/dL (ref 65–100)
Potassium: 3.3 mmol/L — ABNORMAL LOW (ref 3.5–5.1)
Protein, total: 7.1 g/dL (ref 6.4–8.2)
Sodium: 140 mmol/L (ref 136–145)

## 2018-07-05 LAB — CBC WITH AUTOMATED DIFF
ABS. BASOPHILS: 0 10*3/uL (ref 0.0–0.1)
ABS. EOSINOPHILS: 0.2 10*3/uL (ref 0.0–0.4)
ABS. IMM. GRANS.: 0 10*3/uL (ref 0.00–0.04)
ABS. LYMPHOCYTES: 0.7 10*3/uL — ABNORMAL LOW (ref 0.8–3.5)
ABS. MONOCYTES: 0.7 10*3/uL (ref 0.0–1.0)
ABS. NEUTROPHILS: 3.2 10*3/uL (ref 1.8–8.0)
ABSOLUTE NRBC: 0 10*3/uL (ref 0.00–0.01)
BASOPHILS: 0 % (ref 0–1)
EOSINOPHILS: 5 % (ref 0–7)
HCT: 32.2 % — ABNORMAL LOW (ref 36.6–50.3)
HGB: 10.6 g/dL — ABNORMAL LOW (ref 12.1–17.0)
IMMATURE GRANULOCYTES: 0 % (ref 0.0–0.5)
LYMPHOCYTES: 15 % (ref 12–49)
MCH: 28.5 PG (ref 26.0–34.0)
MCHC: 32.9 g/dL (ref 30.0–36.5)
MCV: 86.6 FL (ref 80.0–99.0)
MONOCYTES: 14 % — ABNORMAL HIGH (ref 5–13)
MPV: 9.3 FL (ref 8.9–12.9)
NEUTROPHILS: 66 % (ref 32–75)
NRBC: 0 PER 100 WBC
PLATELET: 185 10*3/uL (ref 150–400)
RBC: 3.72 M/uL — ABNORMAL LOW (ref 4.10–5.70)
RDW: 12.7 % (ref 11.5–14.5)
WBC: 4.8 10*3/uL (ref 4.1–11.1)

## 2018-07-05 LAB — PSA, DIAGNOSTIC (PROSTATE SPECIFIC AG): Prostate Specific Ag: 0 ng/mL — ABNORMAL LOW (ref 0.01–4.0)

## 2018-07-05 MED ORDER — LEUPROLIDE (6 MONTH) 45 MG IM SYRINGE KIT
45 mg | Freq: Once | INTRAMUSCULAR | Status: AC
Start: 2018-07-05 — End: 2018-07-05
  Administered 2018-07-05: 14:00:00 via INTRAMUSCULAR

## 2018-07-05 MED FILL — LUPRON DEPOT 45 MG (6 MONTH) INTRAMUSCULAR SYRINGE KIT: 45 mg | INTRAMUSCULAR | Qty: 1

## 2018-07-05 NOTE — Progress Notes (Signed)
Bruce Clark admit to [facility] for Lupron/Labs ambulatory in stable condition. Assessment completed. No new concerns voiced. Labs drawn peripherally per order and sent for processing.     Visit Vitals  BP 150/88   Pulse 94   Temp 97.1 ??F (36.2 ??C)   Resp 18       Medications:  Medications Administered     leuprolide depot (LUPRON 6 MONTH) injection 45 mg     Admin Date  07/05/2018 Action  Given Dose  45 mg Route  IntraMUSCular Administered By  Iver Nestle, RN              IM Right GM    0900 Clark tolerated treatment well. D/c home ambulatory in no distress. Clark aware of next [facility] appointment scheduled for 01/07/19.    Recent Results (from the past 12 hour(s))   CBC WITH AUTOMATED DIFF    Collection Time: 07/05/18  8:50 AM   Result Value Ref Range    WBC 4.8 4.1 - 11.1 K/uL    RBC 3.72 (L) 4.10 - 5.70 M/uL    HGB 10.6 (L) 12.1 - 17.0 g/dL    HCT 32.2 (L) 36.6 - 50.3 %    MCV 86.6 80.0 - 99.0 FL    MCH 28.5 26.0 - 34.0 PG    MCHC 32.9 30.0 - 36.5 g/dL    RDW 12.7 11.5 - 14.5 %    PLATELET 185 150 - 400 K/uL    MPV 9.3 8.9 - 12.9 FL    NRBC 0.0 0 PER 100 WBC    ABSOLUTE NRBC 0.00 0.00 - 0.01 K/uL    NEUTROPHILS 66 32 - 75 %    LYMPHOCYTES 15 12 - 49 %    MONOCYTES 14 (H) 5 - 13 %    EOSINOPHILS 5 0 - 7 %    BASOPHILS 0 0 - 1 %    IMMATURE GRANULOCYTES 0 0.0 - 0.5 %    ABS. NEUTROPHILS 3.2 1.8 - 8.0 K/UL    ABS. LYMPHOCYTES 0.7 (L) 0.8 - 3.5 K/UL    ABS. MONOCYTES 0.7 0.0 - 1.0 K/UL    ABS. EOSINOPHILS 0.2 0.0 - 0.4 K/UL    ABS. BASOPHILS 0.0 0.0 - 0.1 K/UL    ABS. IMM. GRANS. 0.0 0.00 - 0.04 K/UL    DF SMEAR SCANNED      RBC COMMENTS NORMOCYTIC, NORMOCHROMIC     METABOLIC PANEL, COMPREHENSIVE    Collection Time: 07/05/18  8:50 AM   Result Value Ref Range    Sodium 140 136 - 145 mmol/L    Potassium 3.3 (L) 3.5 - 5.1 mmol/L    Chloride 105 97 - 108 mmol/L    CO2 28 21 - 32 mmol/L    Anion gap 7 5 - 15 mmol/L    Glucose 82 65 - 100 mg/dL    BUN 8 6 - 20 MG/DL    Creatinine 0.74 0.70 - 1.30 MG/DL     BUN/Creatinine ratio 11 (L) 12 - 20      GFR est AA >60 >60 ml/min/1.62m    GFR est non-AA >60 >60 ml/min/1.739m   Calcium 9.0 8.5 - 10.1 MG/DL    Bilirubin, total 0.4 0.2 - 1.0 MG/DL    ALT (SGPT) 9 (L) 12 - 78 U/L    AST (SGOT) 20 15 - 37 U/L    Alk. phosphatase 104 45 - 117 U/L    Protein, total 7.1 6.4 - 8.2 g/dL  Albumin 3.3 (L) 3.5 - 5.0 g/dL    Globulin 3.8 2.0 - 4.0 g/dL    A-G Ratio 0.9 (L) 1.1 - 2.2     PSA, DIAGNOSTIC (PROSTATE SPECIFIC AG)    Collection Time: 07/05/18  8:50 AM   Result Value Ref Range    Prostate Specific Ag 0.0 (L) 0.01 - 4.0 ng/mL

## 2018-07-05 NOTE — Progress Notes (Signed)
 Bruce Clark admit to [facility] for Lupron/Labs ambulatory in stable condition. Assessment completed. No new concerns voiced. Labs drawn peripherally per order and sent for processing.     Visit Vitals  BP 150/88   Pulse 94   Temp 97.1 F (36.2 C)   Resp 18       Medications:  Medications Administered     leuprolide depot (LUPRON 6 MONTH) injection 45 mg     Admin Date  07/05/2018 Action  Given Dose  45 mg Route  IntraMUSCular Administered By  Burnard Jon ORN, RN              IM Right GM    0900 Clark tolerated treatment well. D/c home ambulatory in no distress. Clark aware of next [facility] appointment scheduled for 01/07/19.    Recent Results (from the past 12 hour(s))   CBC WITH AUTOMATED DIFF    Collection Time: 07/05/18  8:50 AM   Result Value Ref Range    WBC 4.8 4.1 - 11.1 K/uL    RBC 3.72 (L) 4.10 - 5.70 M/uL    HGB 10.6 (L) 12.1 - 17.0 g/dL    HCT 67.7 (L) 63.3 - 50.3 %    MCV 86.6 80.0 - 99.0 FL    MCH 28.5 26.0 - 34.0 PG    MCHC 32.9 30.0 - 36.5 g/dL    RDW 87.2 88.4 - 85.4 %    PLATELET 185 150 - 400 K/uL    MPV 9.3 8.9 - 12.9 FL    NRBC 0.0 0 PER 100 WBC    ABSOLUTE NRBC 0.00 0.00 - 0.01 K/uL    NEUTROPHILS 66 32 - 75 %    LYMPHOCYTES 15 12 - 49 %    MONOCYTES 14 (H) 5 - 13 %    EOSINOPHILS 5 0 - 7 %    BASOPHILS 0 0 - 1 %    IMMATURE GRANULOCYTES 0 0.0 - 0.5 %    ABS. NEUTROPHILS 3.2 1.8 - 8.0 K/UL    ABS. LYMPHOCYTES 0.7 (L) 0.8 - 3.5 K/UL    ABS. MONOCYTES 0.7 0.0 - 1.0 K/UL    ABS. EOSINOPHILS 0.2 0.0 - 0.4 K/UL    ABS. BASOPHILS 0.0 0.0 - 0.1 K/UL    ABS. IMM. GRANS. 0.0 0.00 - 0.04 K/UL    DF SMEAR SCANNED      RBC COMMENTS NORMOCYTIC, NORMOCHROMIC     METABOLIC PANEL, COMPREHENSIVE    Collection Time: 07/05/18  8:50 AM   Result Value Ref Range    Sodium 140 136 - 145 mmol/L    Potassium 3.3 (L) 3.5 - 5.1 mmol/L    Chloride 105 97 - 108 mmol/L    CO2 28 21 - 32 mmol/L    Anion gap 7 5 - 15 mmol/L    Glucose 82 65 - 100 mg/dL    BUN 8 6 - 20 MG/DL    Creatinine 9.25 9.29 - 1.30 MG/DL    BUN/Creatinine ratio 11 (L) 12 -  20      GFR est AA >60 >60 ml/min/1.49m2    GFR est non-AA >60 >60 ml/min/1.51m2    Calcium 9.0 8.5 - 10.1 MG/DL    Bilirubin, total 0.4 0.2 - 1.0 MG/DL    ALT (SGPT) 9 (L) 12 - 78 U/L    AST (SGOT) 20 15 - 37 U/L    Alk. phosphatase 104 45 - 117 U/L    Protein, total 7.1 6.4 - 8.2 g/dL  Albumin 3.3 (L) 3.5 - 5.0 g/dL    Globulin 3.8 2.0 - 4.0 g/dL    A-G Ratio 0.9 (L) 1.1 - 2.2     PSA, DIAGNOSTIC (PROSTATE SPECIFIC AG)    Collection Time: 07/05/18  8:50 AM   Result Value Ref Range    Prostate Specific Ag 0.0 (L) 0.01 - 4.0 ng/mL

## 2018-07-09 ENCOUNTER — Ambulatory Visit
Admit: 2018-07-09 | Discharge: 2018-07-09 | Payer: PRIVATE HEALTH INSURANCE | Attending: Internal Medicine | Primary: Internal Medicine

## 2018-07-09 ENCOUNTER — Ambulatory Visit: Attending: Internal Medicine | Primary: Internal Medicine

## 2018-07-09 DIAGNOSIS — C61 Malignant neoplasm of prostate: Secondary | ICD-10-CM

## 2018-07-09 MED ORDER — OXYCODONE-ACETAMINOPHEN 5 MG-325 MG TAB
5-325 mg | ORAL_TABLET | ORAL | 0 refills | Status: AC | PRN
Start: 2018-07-09 — End: 2018-08-08

## 2018-07-09 NOTE — Progress Notes (Signed)
Bruce Clark is a 67 y.o. male  Chief Complaint   Patient presents with   ??? Follow-up   ??? Prostate Cancer     1. Have you been to the ER, urgent care clinic since your last visit?  Hospitalized since your last visit? No  2. Have you seen or consulted any other health care providers outside of the [facility] since your last visit?  Include any pap smears or colon screening. No

## 2018-07-09 NOTE — Progress Notes (Signed)
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 male who is on Eliquis for a h/o DVT who presented to the Emergency Department on 04/22/16 with nausea, vomiting, and abdominal pain x 2 months, decreased appetite and weight loss of 10-14 lbs.  CTA revealed bilateral lung nodules and mediastinal/hilar adenopathy. PET revealed supraclavicular adenopathy, retroperitoneal adenopathy, bilateral iliac adenopathy, mediastinal/hilar adenopathy, bony metastatic disease, right lower lobe pulmonary mass, and multiple other smaller masses in bilateral lungs. USG guided bx of supraclavicular LN showed adenocarcinoma of the prostate. He was also diagnosed and treated for CAP. PSA on 05/26/16-2400. Was discussed in tumor board, Napsin A negative and a small cell component ruled out. Initiated Degarelix  05/26/16. MRI spine with expansile C7 lesion, T12 lesion encroaching epidural space, S1-S2 lesion encroaching neural foramina. Started palliative C6-T5 and T11-L5 XRT under Dr. Leron Croak on 06/09/16. Started Zytiga 08/28/16 with evidence of response. Noted to have a  Left lower lobe lung nodule on CT done 12/22/16. Biopsy consistent with Lung adenocarcinoma. He had a mediastinoscopy with LN being negative for metastatic disease. Robotic RLL lobectomy performed on 05/22/17.     He underwent a robotic right lower lobectomy on 05/22/17 with Dr. Glo Herring. Pathology showed adenocarcinoma T3N2 stage IIIB. Case discussed at tumor board and there is no plan for adjuvant chemotherapy at this time. He completed post op radiation with Dr. Leron Croak on 09/18/17.     Comes in today for follow-up. He has not been feeling well lately. He has increased pain over his right chest area over previous surgical site as well as right hip pain. He received a cortisone injection in his right hip  on Monday but this has not helped. Cough is at baseline. Reports SOB with moderate exertion which is stable. He has been less active lately due to pain. No fevers/chills. He reports his right sided chest pain comes and goes. Denies nausea/vomiting. Denies diarrhea/constipation. No mouth sores. Continues to take Zytiga + Prednisone daily. Denies bleeding on Eliquis.     Otherwise, complete ROS is per the symptom report form which has been scanned into the media section of the electronic medical record.    Past Medical History:   Diagnosis Date   ??? Lung cancer ([place])    ??? Prostate cancer ([place])    ??? Stroke ([place])     PT STATES, "I HAD A STROKE A LONG TIME AGO"       Past Surgical History:   Procedure Laterality Date   ??? CHEST SURGERY PROCEDURE UNLISTED  04/27/2017    BRONCHOSCOPY/MEDIASTINOSCOPY   ??? HX GI      HERNIA REPAIR   ??? HX ORTHOPAEDIC  1994    ROD IN RIGHT LEG       No Known Allergies    Current Outpatient Medications   Medication Sig Dispense Refill   ??? apixaban (ELIQUIS) 5 mg tablet Take 1 Tab by mouth two (2) times a day. 60 Tab 12   ??? predniSONE (DELTASONE) 5 mg tablet Take 1 Tab by mouth daily. Restart on Jun 01, 2017 30 Tab 6   ??? abiraterone (ZYTIGA) 500 mg tab Take 1,000 mg by mouth daily. 60 Tab 3   ??? polyethylene glycol (MIRALAX) 17 gram packet Take 1 Packet by mouth daily. 30 Packet 2   ??? magic mouthwash  solution Magic mouth wash   Maalox  Lidocaine 2% viscous   Diphenhydramine oral solution   Nystatin 100,000 units    Pharmacy to mix equal portions of ingredients to a total volume as indicated in the dispense amount.  Swish and spit 78m (1 tsp) four times daily as needed for mouth sores 480 mL 3   ??? senna (SENOKOT) 8.6 mg tablet Take 1 Tab by mouth nightly. Indications: constipation 30 Tab 0   ??? magnesium hydroxide (PHILLIPS MILK OF MAGNESIA) 400 mg/5 mL suspension Take 30 mL by mouth daily as needed for Constipation.         Social History     Socioeconomic History   ??? Marital status: SINGLE      Spouse name: Not on file   ??? Number of children: Not on file   ??? Years of education: Not on file   ??? Highest education level: Not on file   Tobacco Use   ??? Smoking status: Former Smoker     Packs/day: 0.50     Years: 47.00     Pack years: 23.50     Last attempt to quit: 06/2016     Years since quitting: 2.0   ??? Smokeless tobacco: Never Used   Substance and Sexual Activity   ??? Alcohol use: No   ??? Drug use: No       Family History   Problem Relation Age of Onset   ??? Cancer Mother         COLON   ??? Cancer Father         BRAIN TUMOR   ??? No Known Problems Sister    ??? Arthritis-osteo Brother    ??? No Known Problems Sister    ??? No Known Problems Sister    ??? No Known Problems Sister    ??? No Known Problems Brother    ??? Anesth Problems Neg Hx      ROS  A review of systems was obtained and is negative except as listed in HPI.  ECOG PS is 1    Physical Examination:   Visit Vitals  BP 122/73 (BP 1 Location: Right arm)   Pulse 96   Temp 98.2 ??F (36.8 ??C)   Resp 17   Ht 5' 7"  (1.702 m)   Wt 142 lb 11.2 oz (64.7 kg)   SpO2 96%   BMI 22.35 kg/m??     General appearance - alert, and in no distress, appears fatigued  Mental status - oriented to person, place, and time  Mouth - mucous membranes moist, no lesions noted  Neck - supple, no significant adenopathy  Resp-CTA, RLL diminished, diminished, normal respiratory effort  ABD- soft, non tender, active bowel sounds  Neurological - normal speech, no focal findings or movement disorder noted  Musculoskeletal - no joint tenderness, deformity or swelling  Extremities - peripheral pulses normal, no pedal edema, no clubbing or cyanosis  Skin - robotic incision site and previous chest tube site on right side of chest appear to be healing appropriately    LABS  Lab Results   Component Value Date/Time    WBC 4.8 07/05/2018 08:50 AM    HGB 10.6 (L) 07/05/2018 08:50 AM    HCT 32.2 (L) 07/05/2018 08:50 AM    PLATELET 185 07/05/2018 08:50 AM    MCV 86.6 07/05/2018 08:50 AM     ABS. NEUTROPHILS 3.2 07/05/2018 08:50 AM     Lab Results   Component Value Date/Time  Sodium 140 07/05/2018 08:50 AM    Potassium 3.3 (L) 07/05/2018 08:50 AM    Chloride 105 07/05/2018 08:50 AM    CO2 28 07/05/2018 08:50 AM    Glucose 82 07/05/2018 08:50 AM    BUN 8 07/05/2018 08:50 AM    Creatinine 0.74 07/05/2018 08:50 AM    GFR est AA >60 07/05/2018 08:50 AM    GFR est non-AA >60 07/05/2018 08:50 AM    Calcium 9.0 07/05/2018 08:50 AM     Lab Results   Component Value Date/Time    AST (SGOT) 20 07/05/2018 08:50 AM    Alk. phosphatase 104 07/05/2018 08:50 AM    Protein, total 7.1 07/05/2018 08:50 AM    Albumin 3.3 (L) 07/05/2018 08:50 AM    Globulin 3.8 07/05/2018 08:50 AM    A-G Ratio 0.9 (L) 07/05/2018 08:50 AM     Recent Labs     07/05/18  0850 05/11/18  0959 03/24/18  0949 02/17/18  0851 01/01/18  0811 11/27/17  0806 10/16/17  0759 10/06/17  1037 09/15/17  0909   PSA 0.0*  --  0.1 0.1 0.1 0.1 0.1 0.1 0.1   PSALT  --  <0.1  --   --   --   --   --   --   --        IMAGING  PET CT  03/23/17  IMPRESSION: Mass lesion right lower lobe is hypermetabolic and compatible with  the biopsy confirmed the result of adenocarcinoma. There are soft tissue  densities in the anterior abdominal wall bilaterally which are stable compared  to the prior chest abdomen pelvis CT but new compared to the prior PET/CT.  Please correlate with any recent abdominal wall surgery as these may represent  port sites. Otherwise normal tracer distribution. Stable sclerotic osseous  metastatic disease without abnormal activity.    PATHOLOGY  Supraclavicular node   CYTOLOGIC INTERPRETATION:   Adenocarcinoma, consistent with prostate primary   See comment   General Categorization   Positive for malignancy.   Specimen Adequacy   Satisfactory for evaluation.   Comment   Touch preps and a core biopsy are examined and show islands of adenocarcinoma surrounded by fibrous stroma. A panel of  immunohistochemical stains was performed to evaluate site of origin. The tumor cells are focally positive for PSA and PSAP and negative for CK7, CK20, TTF-1    CT CAP 03/22/18  IMPRESSION  IMPRESSION:   1. Status post right lung surgery with right perihilar consolidation and  scarring consistent with post surgery and radiation therapy change. These  findings are stable.  2. Enlarged prostate.  3. Sclerotic bone metastases unchanged.  4. Atherosclerotic aorta without aneurysm      ASSESSMENT  Bruce Clark is a 67 y.o. male with widely metastatic prostate adenocarcinoma and newly diagnosed lung adenocarcinoma status post right lower lobe lobectomy on 05/22/17 and presents today for follow-up.     PLAN    Prostate cancer  Castrate sensitive, treatment na??ve widely metastatic prostate cancer to lymph nodes above and below the diaphragm and bones.  Prostate adenocarcinoma diagnosed on biopsy of the supraclavicular lymph node. Discussed in tumor board and a small cell component has been ruled out.  PSA at the time of diagnosis at 2400.    Currently on Lupron 45 mg every 6 months. On Zytiga 1,023m daily + prednisone 5 mg daily per [place].   He has continued to have a radiologic and PSA response with CT 8/5 Bone  scan remain stable.  CBC, CMP, PSA monthly in [facility]   Lupron q6 months, received this month      R lung adenocarcinoma, T3N2MX stage IIIB- Parietal pleural involvement- PD-L1 99%  S/p RLL lobectomy on 10/5  Very high risk of recurrence  Case was discussed in tumor board and the consensus was to offer PORT with N2 disease  PORT with Dr. Leron Croak Completed  09/18/17.     As regards adjuvant chemotherapy- we elected to not pursue this with another life limiting illness. We decided to consider immunotherapy with any evidence of recurrence.    Repeat imaging shows no recurrence AS OF 03/22/18. Repeat imaging due November 2019 and will assist in getting this scheduled ASAP as he is not been feeling well.      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 Will follow     Labs monthly in [facility]   CT/bone scan scheduled for pt on 07/13/18, will call with results and if progressive disease will have him return to clinic sooner  If not, RTC in 2 months      RJuliene PinaMD, [facility]

## 2018-07-09 NOTE — Progress Notes (Signed)
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 Denherder  is a 67 y.o. male who is  on Eliquis for a h/o DVT who presented to the Emergency Department on 04/22/16 with nausea, vomiting, and abdominal pain x 2 months, decreased appetite and weight loss of 10-14 lbs.   CTA revealed bilateral lung nodules and mediastinal/hilar adenopathy. PET revealed supraclavicular adenopathy, retroperitoneal adenopathy,  bilateral iliac adenopathy, mediastinal/hilar adenopathy, bony metastatic disease, right lower lobe pulmonary mass, and multiple other smaller masses in bilateral lungs. USG guided bx of supraclavicular  LN showed adenocarcinoma of the prostate. He was also diagnosed and treated for CAP. PSA on 05/26/16-2400. Was discussed in tumor board, Napsin A negative and a small cell component ruled out. Initiated Degarelix  05/26/16. MRI spine with expansile C7 lesion,  T12 lesion encroaching epidural space, S1-S2 lesion encroaching neural foramina. Started palliative C6-T5 and T11-L5 XRT under Dr. Leron Croak on 06/09/16. Started Zytiga 08/28/16 with evidence of response. Noted to have a  Left lower lobe lung nodule on  CT done 12/22/16. Biopsy consistent with Lung adenocarcinoma. He had a mediastinoscopy with LN being negative for metastatic disease. Robotic RLL lobectomy performed on 05/22/17.       He underwent a robotic right lower lobectomy on 05/22/17 with Dr. Glo Herring. Pathology showed adenocarcinoma T3N2 stage IIIB. Case discussed at tumor board and there is no plan for adjuvant chemotherapy at this  time. He completed post op radiation with  Dr. Leron Croak on 09/18/17.       Comes in today for follow-up. He has not been feeling well lately. He has increased pain over his right chest area over previous surgical site as well as right hip pain. He received a cortisone injection in his right hip on Monday but this has not helped.  Cough is at baseline. Reports SOB with moderate exertion which is stable. He has been less active lately due to pain. No fevers/chills. He reports his right sided chest pain comes and goes. Denies nausea/vomiting. Denies diarrhea/constipation. No mouth  sores. Continues to take Zytiga + Prednisone daily. Denies bleeding on Eliquis.       Otherwise, complete ROS is per the symptom report form which has been scanned into the media section of the electronic medical record.        Past Medical History:        Diagnosis  Date         ?  Lung cancer ([place])       ?  Prostate cancer ([place])       ?  Stroke [facility])            PT STATES, "I HAD A STROKE A LONG TIME AGO"             Past  Surgical History:         Procedure  Laterality  Date          ?  CHEST SURGERY PROCEDURE UNLISTED    04/27/2017          BRONCHOSCOPY/MEDIASTINOSCOPY          ?  HX GI              HERNIA REPAIR          ?  HX ORTHOPAEDIC    1994          ROD IN RIGHT LEG           No Known Allergies        Current Outpatient Medications          Medication  Sig  Dispense  Refill           ?  apixaban (ELIQUIS) 5 mg tablet  Take 1 Tab by mouth two (2) times a day.  60 Tab  12     ?  predniSONE (DELTASONE) 5 mg tablet  Take 1 Tab by mouth daily. Restart on Jun 01, 2017  30 Tab  6     ?  abiraterone (ZYTIGA) 500 mg tab  Take 1,000 mg by mouth daily.  60 Tab  3     ?  polyethylene glycol (MIRALAX) 17 gram packet  Take 1 Packet by mouth daily.  30 Packet  2     ?  magic mouthwash solution  Magic mouth wash    Maalox   Lidocaine 2% viscous    Diphenhydramine oral solution    Nystatin 100,000 units      Pharmacy to mix equal portions of  ingredients to a total volume as indicated in the dispense amount.   Swish and spit 26m (1 tsp) four times daily as needed for mouth sores  480 mL  3     ?  senna (SENOKOT) 8.6 mg tablet  Take 1 Tab by mouth nightly. Indications: constipation  30 Tab  0           ?  magnesium hydroxide (PHILLIPS MILK OF MAGNESIA) 400 mg/5 mL suspension  Take 30 mL by mouth daily as needed for Constipation.                 Social History          Socioeconomic History         ?  Marital status:  SINGLE              Spouse name:  Not on file         ?  Number of children:  Not on file     ?  Years of education:  Not on file     ?  Highest education level:  Not on file       Tobacco Use         ?  Smoking status:  Former Smoker              Packs/day:  0.50         Years:  47.00         Pack years:  23.50         Last attempt to quit:  06/2016         Years since quitting:  2.0         ?  Smokeless tobacco:  Never Used  Substance and Sexual Activity         ?  Alcohol use:  No         ?  Drug use:  No             Family History         Problem  Relation  Age of Onset          ?  Cancer  Mother                COLON          ?  Cancer  Father                BRAIN TUMOR          ?  No Known Problems  Sister       ?  Arthritis-osteo  Brother       ?  No Known Problems  Sister       ?  No Known Problems  Sister       ?  No Known Problems  Sister       ?  No Known Problems  Brother            ?  Anesth Problems  Neg Hx          ROS   A review of systems was obtained and is negative except as listed in HPI.   ECOG PS is 1      Physical Examination:    Visit Vitals      BP  122/73 (BP 1 Location: Right arm)     Pulse  96     Temp  98.2 ??F (36.8 ??C)     Resp  17     Ht  5' 7" (1.702 m)     Wt  142 lb 11.2 oz (64.7 kg)     SpO2  96%        BMI  22.35 kg/m??        General appearance - alert, and in no distress, appears fatigued   Mental status - oriented to person, place, and time   Mouth - mucous membranes moist, no lesions noted   Neck -  supple, no significant adenopathy   Resp-CTA, RLL diminished, diminished, normal respiratory effort   ABD- soft, non tender, active bowel sounds   Neurological - normal speech, no focal findings or movement disorder noted   Musculoskeletal - no joint tenderness, deformity or swelling   Extremities - peripheral pulses normal, no pedal edema, no clubbing or cyanosis   Skin - robotic incision site and previous chest tube site on right side of chest appear to be healing appropriately      LABS     Lab Results         Component  Value  Date/Time            WBC  4.8  07/05/2018 08:50 AM       HGB  10.6 (L)  07/05/2018 08:50 AM       HCT  32.2 (L)  07/05/2018 08:50 AM       PLATELET  185  07/05/2018 08:50 AM       MCV  86.6  07/05/2018 08:50 AM            ABS. NEUTROPHILS  3.2  07/05/2018 08:50 AM          Lab Results         Component  Value  Date/Time            Sodium  140  07/05/2018 08:50 AM       Potassium  3.3 (L)  07/05/2018 08:50 AM       Chloride  105  07/05/2018 08:50 AM       CO2  28  07/05/2018 08:50 AM       Glucose  82  07/05/2018 08:50 AM       BUN  8  07/05/2018 08:50 AM       Creatinine  0.74  07/05/2018 08:50 AM       GFR est AA  >60  07/05/2018 08:50 AM       GFR est non-AA  >60  07/05/2018 08:50 AM            Calcium  9.0  07/05/2018 08:50 AM          Lab Results         Component  Value  Date/Time            AST (SGOT)  20  07/05/2018 08:50 AM       Alk. phosphatase  104  07/05/2018 08:50 AM       Protein, total  7.1  07/05/2018 08:50 AM       Albumin  3.3 (L)  07/05/2018 08:50 AM       Globulin  3.8  07/05/2018 08:50 AM            A-G Ratio  0.9 (L)  07/05/2018 08:50 AM          Recent Labs                   07/05/18   0850  05/11/18   0959  03/24/18   0949  02/17/18   0851  01/01/18   0811  11/27/17   0806  10/16/17   0759  10/06/17   1037  09/15/17   0909     PSA  0.0*   --   0.1  0.1  0.1  0.1  0.1  0.1  0.1                PSALT   --   <0.1   --    --    --    --    --    --    --             IMAGING   PET CT  03/23/17   IMPRESSION: Mass lesion right lower lobe is hypermetabolic and compatible with   the biopsy confirmed the result of adenocarcinoma. There are soft tissue   densities in the anterior abdominal wall bilaterally which are stable compared   to the prior chest abdomen pelvis CT but new compared to the prior PET/CT.   Please correlate with any recent abdominal wall surgery as these may represent   port sites. Otherwise normal tracer distribution. Stable sclerotic osseous   metastatic disease without abnormal activity.      PATHOLOGY   Supraclavicular node    CYTOLOGIC INTERPRETATION:    Adenocarcinoma, consistent with prostate primary    See comment    General Categorization    Positive for malignancy.    Specimen Adequacy    Satisfactory for evaluation.    Comment    Touch preps and a core biopsy are examined and show islands of adenocarcinoma surrounded by fibrous stroma. A panel of immunohistochemical stains was performed  to evaluate site of origin. The tumor cells are focally positive for PSA and PSAP and negative for CK7, CK20, TTF-1      CT CAP 03/22/18   IMPRESSION   IMPRESSION:    1. Status post right lung surgery with right perihilar consolidation and   scarring consistent with post surgery and radiation therapy change. These   findings are stable.   2. Enlarged prostate.   3. Sclerotic bone metastases unchanged.   4. Atherosclerotic aorta without aneurysm         ASSESSMENT   Mr. Due is a  67 y.o. male with widely metastatic prostate adenocarcinoma and newly diagnosed lung adenocarcinoma  status post right lower lobe lobectomy on 05/22/17 and presents today for follow-up.       PLAN      Prostate cancer   Castrate sensitive, treatment na??ve widely metastatic prostate cancer to lymph nodes above and below the diaphragm and bones.   Prostate adenocarcinoma diagnosed on biopsy of the supraclavicular lymph node. Discussed in tumor board and a small cell component has been ruled out.    PSA at the time of diagnosis at 2400.      Currently on Lupron 45 mg every 6 months. On Zytiga 1,023m daily + prednisone 5 mg daily per [place].    He has continued to have a radiologic and PSA response with CT 8/5 Bone scan remain stable.   CBC, CMP, PSA monthly in [facility]    Lupron q6 months, received this month        R lung adenocarcinoma, T3N2MX stage IIIB- Parietal pleural involvement-  PD-L1 99%   S/p RLL lobectomy on 10/5   Very high risk of recurrence   Case was discussed in tumor board and the consensus was to offer PORT with N2 disease   PORT with Dr. PLeron CroakCompleted  09/18/17.       As regards adjuvant chemotherapy- we elected to not pursue this with another life limiting illness. We decided to consider immunotherapy with any evidence of recurrence.      Repeat imaging shows no recurrence AS OF 03/22/18. Repeat imaging due November 2019 and will assist in getting this scheduled ASAP as he is not been feeling well.       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 Will follow       Labs monthly in [facility]    CT/bone scan scheduled for pt on 07/13/18, will call with results and if progressive disease will have him return to clinic sooner   If not, RTC in 2 months         RaJuliene PinaD, [facility]

## 2018-07-09 NOTE — Progress Notes (Signed)
 Bruce Clark is a 67 y.o. male  Chief Complaint   Patient presents with   . Follow-up   . Prostate Cancer   1. Have you been to the ER, urgent care clinic since your last visit?  Hospitalized since your last visit? No  2. Have you seen or consulted any other health care providers outside of the [facility] since your last visit?  Include any pap smears or colon screening. No

## 2018-07-13 ENCOUNTER — Inpatient Hospital Stay: Payer: MEDICARE | Attending: Registered Nurse | Primary: Internal Medicine

## 2018-07-13 ENCOUNTER — Inpatient Hospital Stay: Admit: 2018-07-13 | Payer: MEDICARE | Attending: Registered Nurse | Primary: Internal Medicine

## 2018-07-13 DIAGNOSIS — C61 Malignant neoplasm of prostate: Secondary | ICD-10-CM

## 2018-07-13 MED ORDER — TECHNETIUM TC 99M MEDRONATE IV KIT
Freq: Once | Status: AC
Start: 2018-07-13 — End: 2018-07-13
  Administered 2018-07-13: 14:00:00 via INTRAVENOUS

## 2018-07-13 MED ORDER — IOPAMIDOL 76 % IV SOLN
370 mg iodine /mL (76 %) | Freq: Once | INTRAVENOUS | Status: AC
Start: 2018-07-13 — End: 2018-07-13
  Administered 2018-07-13: 17:00:00 via INTRAVENOUS

## 2018-07-13 MED ORDER — SODIUM CHLORIDE 0.9% BOLUS IV
0.9 % | Freq: Once | INTRAVENOUS | Status: AC
Start: 2018-07-13 — End: 2018-07-13
  Administered 2018-07-13: 17:00:00 via INTRAVENOUS

## 2018-07-13 MED ORDER — SODIUM CHLORIDE 0.9 % IJ SYRG
Freq: Once | INTRAMUSCULAR | Status: AC
Start: 2018-07-13 — End: 2018-07-13
  Administered 2018-07-13: 17:00:00 via INTRAVENOUS

## 2018-07-13 MED ORDER — IOHEXOL 240 MG/ML IV SOLN
240 mg iodine/mL | Freq: Once | INTRAVENOUS | Status: AC
Start: 2018-07-13 — End: 2018-07-13
  Administered 2018-07-13: 17:00:00 via ORAL

## 2018-07-13 MED FILL — NORMAL SALINE FLUSH 0.9 % INJECTION SYRINGE: INTRAMUSCULAR | Qty: 10

## 2018-07-13 MED FILL — SODIUM CHLORIDE 0.9 % IV: INTRAVENOUS | Qty: 100

## 2018-07-13 MED FILL — ISOVUE-370  76 % INTRAVENOUS SOLUTION: 370 mg iodine /mL (76 %) | INTRAVENOUS | Qty: 100

## 2018-07-13 MED FILL — OMNIPAQUE 240 MG IODINE/ML INTRAVENOUS SOLUTION: 240 mg iodine/mL | INTRAVENOUS | Qty: 50

## 2018-07-13 NOTE — Telephone Encounter (Signed)
Patient

## 2018-07-21 ENCOUNTER — Encounter

## 2018-07-21 NOTE — Progress Notes (Signed)
Called Mr. Wimberly regarding CT findings and need for liver biopsy and urology referral.  Liver biopsy ordered-please have this scheduled for him and call him with apt date/time.  Urology referral (urgent) placed   Will arrange fu once biopsy is scheduled

## 2018-07-21 NOTE — Progress Notes (Signed)
Spoke with [employer] about scheduling pt for CTscan ( liver biopsy). Lizette is getting approval and will call back with date and time

## 2018-07-21 NOTE — Progress Notes (Signed)
Spoke with [employer] about scheduling pt for CTscan ( liver biopsy). Bruce Clark is getting approval and will call back with date and time

## 2018-07-21 NOTE — Progress Notes (Signed)
Called Bruce Clark regarding CT findings and need for liver biopsy and urology referral.  Liver biopsy ordered-please have this scheduled for him and call him with apt date/time.  Urology referral (urgent) placed   Will arrange fu once biopsy is scheduled

## 2018-07-28 ENCOUNTER — Encounter

## 2018-07-28 NOTE — Progress Notes (Signed)
Scans are reviewed with Dr. Karoline Caldwell    - His prostate is larger and there are some nodules inside his bladder. One of these nodules are blocking urine from from the ureter and we need this to be looked at by Urologist.   - He also needs to have a liver biopsy Bruce Clark which is scheduled and we need to make sure its done   - He has not yet had a bone scan and would need that done so we can understand where his pain is coming from    Bruce Clark or Lytle Michaels could you please call him and update  He should see [place] jan 2 after all this is done

## 2018-07-28 NOTE — Telephone Encounter (Signed)
Called Holley Bouche, who is listed on the patient's HIPAA release form and left a message to call Dr. Rosina Lowenstein office back as soon as possible regarding appointments that have been made for the patient.  Called to inform her that:    - Liver biopsy is scheduled for 07/30/18 at 11:00 am but to arrive at 9:30 am, that hs is unable to eat or drink anything after midnight the night before the procedure, and that he will need a driver to take him home after the procedure.    - Bone scan is scheduled for 08/03/18 at 10:30 am [facility].    And that per Dr. Melony Overly:    - Scans were reviewed with Dr. Karoline Caldwell  ??  - His prostate is larger and there are some nodules inside his bladder. One of these nodules are blocking urine from from the ureter and we need this to be looked at by Urologist.   - He also needs to have a liver biopsy Judson Roch which is scheduled and we need to make sure its done   - He has not yet had a bone scan and would need that done so we can understand where his pain is coming from  - He should see [place] jan 2 after all this is done

## 2018-07-28 NOTE — Progress Notes (Signed)
Scans are reviewed with Dr. Karoline Caldwell    - His prostate is larger and there are some nodules inside his bladder. One of these nodules are blocking urine from from the ureter and we need this to be looked at by Urologist.   - He also needs to have a liver biopsy Bruce Clark which is scheduled and we need to make sure its done   - He has not yet had a bone scan and would need that done so we can understand where his pain is coming from    Bruce Clark or Bruce Clark could you please call him and update  He should see [place] jan 2 after all this is done

## 2018-07-29 NOTE — Telephone Encounter (Signed)
Patient called and stated he would like a call back to discuss his surgery that was scheduled for tomorrow at 9:00AM. CB# [phone]

## 2018-07-29 NOTE — Telephone Encounter (Signed)
Returned patient's call and spoke with both patient and patient friend Holley Bouche)   HIPPA verified x2 identifiers     Reviewed upcoming biopsy appointment/procedure; all questions answered at this time

## 2018-07-30 ENCOUNTER — Inpatient Hospital Stay: Payer: MEDICARE | Attending: Registered Nurse | Primary: Internal Medicine

## 2018-07-30 DIAGNOSIS — C61 Malignant neoplasm of prostate: Secondary | ICD-10-CM

## 2018-08-03 ENCOUNTER — Inpatient Hospital Stay: Payer: MEDICARE | Attending: Registered Nurse | Primary: Internal Medicine

## 2018-08-04 NOTE — Telephone Encounter (Signed)
Left msg for Ms Holley Bouche to call back to office in reference to mr. Obeirne

## 2018-08-06 ENCOUNTER — Ambulatory Visit: Payer: MEDICARE | Primary: Internal Medicine

## 2018-08-06 NOTE — Telephone Encounter (Signed)
Spoke with pt. HIPPA verified . X2 verifiers .Pt had difficulty understanding with scheduling appt. Pt will call Ms Merdis Delay to call back for appt.

## 2018-08-10 ENCOUNTER — Inpatient Hospital Stay: Payer: MEDICARE | Attending: Registered Nurse | Primary: Internal Medicine

## 2018-08-13 NOTE — Telephone Encounter (Signed)
Spoke with Bruce Clark about pts biopsy. Bruce Clark verbalized and understood appt time. HIPPA verified X2 verifiers

## 2018-08-17 ENCOUNTER — Encounter

## 2018-08-17 ENCOUNTER — Inpatient Hospital Stay: Admit: 2018-08-17 | Payer: MEDICARE | Attending: Registered Nurse | Primary: Internal Medicine

## 2018-08-17 ENCOUNTER — Inpatient Hospital Stay: Payer: MEDICARE | Attending: Vascular & Interventional Radiology | Primary: Internal Medicine

## 2018-08-17 DIAGNOSIS — C61 Malignant neoplasm of prostate: Secondary | ICD-10-CM

## 2018-08-17 DIAGNOSIS — C3490 Malignant neoplasm of unspecified part of unspecified bronchus or lung: Secondary | ICD-10-CM

## 2018-08-17 MED ORDER — MIDAZOLAM 1 MG/ML IJ SOLN
1 mg/mL | INTRAMUSCULAR | Status: DC | PRN
Start: 2018-08-17 — End: 2018-08-21
  Administered 2018-08-17 (×3): via INTRAVENOUS

## 2018-08-17 MED ORDER — SODIUM CHLORIDE 0.9 % IV
INTRAVENOUS | Status: DC
Start: 2018-08-17 — End: 2018-08-21
  Administered 2018-08-17: 17:00:00 via INTRAVENOUS

## 2018-08-17 MED ORDER — FENTANYL CITRATE (PF) 50 MCG/ML IJ SOLN
50 mcg/mL | INTRAMUSCULAR | Status: DC | PRN
Start: 2018-08-17 — End: 2018-08-21
  Administered 2018-08-17 (×4): via INTRAVENOUS

## 2018-08-17 MED FILL — MIDAZOLAM 1 MG/ML IJ SOLN: 1 mg/mL | INTRAMUSCULAR | Qty: 5

## 2018-08-17 MED FILL — FENTANYL CITRATE (PF) 50 MCG/ML IJ SOLN: 50 mcg/mL | INTRAMUSCULAR | Qty: 2

## 2018-08-17 NOTE — Progress Notes (Signed)
1115 am- Patient ambulated to Angio for Liver biopsy. Dr. Tenny Craw in to talk with patient and family with good understanding. Reviewed sedation plan to include medicating under conscious sedation using versed and fentanyl IV.  Reviewed potential side effects with patient and possible interactions with patient's current meds Pt educated on moderate sedation and its purpose; medications and side effects described and patient verbalized understanding. Consent signed. [place] tech tech in to perform [place].

## 2018-08-17 NOTE — Progress Notes (Signed)
1429 pm- Discharge instructions reviewed with patient and family with good understanding. Band aid to right upper liver biopsy sited remains dry and intact. IV discontinued. Patient taken to car via wheelchair with nurse at side. Patient discharged stable.

## 2018-08-17 NOTE — H&P (Signed)
Interventional Radiology History and Physical (Outpatient)    08/17/2018    Patient: Bruce Clark 67 y.o. male     Referring Physician:  Mare Loan, NP    Chief Complaint: New liver lesion    History of Present Illness: Prostate CA, Lung CA    History:  Past Medical History:   Diagnosis Date   ??? Lung cancer ([place])    ??? Prostate cancer ([place])    ??? Stroke ([place])     PT STATES, "I HAD A STROKE A LONG TIME AGO"     Family History   Problem Relation Age of Onset   ??? Cancer Mother         COLON   ??? Cancer Father         BRAIN TUMOR   ??? No Known Problems Sister    ??? Arthritis-osteo Brother    ??? No Known Problems Sister    ??? No Known Problems Sister    ??? No Known Problems Sister    ??? No Known Problems Brother    ??? Anesth Problems Neg Hx      Social History     Socioeconomic History   ??? Marital status: SINGLE     Spouse name: Not on file   ??? Number of children: Not on file   ??? Years of education: Not on file   ??? Highest education level: Not on file   Occupational History   ??? Not on file   Social Needs   ??? Financial resource strain: Not on file   ??? Food insecurity:     Worry: Not on file     Inability: Not on file   ??? Transportation needs:     Medical: Not on file     Non-medical: Not on file   Tobacco Use   ??? Smoking status: Former Smoker     Packs/day: 0.50     Years: 47.00     Pack years: 23.50     Last attempt to quit: 06/2016     Years since quitting: 2.1   ??? Smokeless tobacco: Never Used   Substance and Sexual Activity   ??? Alcohol use: No   ??? Drug use: No   ??? Sexual activity: Not on file   Lifestyle   ??? Physical activity:     Days per week: Not on file     Minutes per session: Not on file   ??? Stress: Not on file   Relationships   ??? Social connections:     Talks on phone: Not on file     Gets together: Not on file     Attends religious service: Not on file     Active member of club or organization: Not on file     Attends meetings of clubs or organizations: Not on file     Relationship status: Not on file    ??? Intimate partner violence:     Fear of current or ex partner: Not on file     Emotionally abused: Not on file     Physically abused: Not on file     Forced sexual activity: Not on file   Other Topics Concern   ??? Not on file   Social History Narrative   ??? Not on file       Allergies: No Known Allergies    Prior to Admission Medications:  Prior to Admission medications    Medication Sig Start Date End Date Taking? Authorizing Provider  apixaban (ELIQUIS) 5 mg tablet Take 1 Tab by mouth two (2) times a day. 06/23/18   Mare Loan, NP   magnesium hydroxide (PHILLIPS MILK OF MAGNESIA) 400 mg/5 mL suspension Take 30 mL by mouth daily as needed for Constipation.    Provider, Historical   predniSONE (DELTASONE) 5 mg tablet Take 1 Tab by mouth daily. Restart on Jun 01, 2017 01/21/18   Mare Loan, NP   abiraterone (ZYTIGA) 500 mg tab Take 1,000 mg by mouth daily. 10/02/17   Mare Loan, NP   polyethylene glycol (MIRALAX) 17 gram packet Take 1 Packet by mouth daily. 08/25/17   Mare Loan, NP   magic mouthwash solution Magic mouth wash   Maalox  Lidocaine 2% viscous   Diphenhydramine oral solution   Nystatin 100,000 units    Pharmacy to mix equal portions of ingredients to a total volume as indicated in the dispense amount.  Swish and spit 70mL (1 tsp) four times daily as needed for mouth sores 08/25/17   Mare Loan, NP   senna (SENOKOT) 8.6 mg tablet Take 1 Tab by mouth nightly. Indications: constipation 05/25/17   Dorann Lodge, NP       Physical Exam:    Blood pressure 155/78, pulse 93, resp. rate 21, SpO2 99 %.  General: alert, cooperative, no distress, appears stated age  Heart: normal S1 and S2  Lungs: clear to auscultation bilaterally  Abdomen: soft, nontender, normal bowel sounds  Neuro: grossly intact  Extremities: extremities normal, atraumatic, no cyanosis or edema    Plan of Care/Planned Procedure:  Risks, benefits, and alternatives reviewed with patient and he agrees to proceed with the procedure.      Juanita Laster, MD

## 2018-08-17 NOTE — Progress Notes (Addendum)
1429 pm- Discharge instructions reviewed with patient and family with good understanding. Band aid to right upper liver biopsy sited remains dry and intact. IV discontinued. Patient taken to car via wheelchair with nurse at side. Patient discharged stable.

## 2018-08-17 NOTE — H&P (Signed)
Interventional Radiology History and Physical (Outpatient)    08/17/2018    Patient: Bruce Clark 67 y.o. male     Referring Physician:  Mare Loan, NP    Chief Complaint: New liver lesion    History of Present Illness: Prostate CA, Lung CA    History:  Past Medical History:   Diagnosis Date   ??? Lung cancer ([place])    ??? Prostate cancer ([place])    ??? Stroke ([place])     PT STATES, "I HAD A STROKE A LONG TIME AGO"     Family History   Problem Relation Age of Onset   ??? Cancer Mother         COLON   ??? Cancer Father         BRAIN TUMOR   ??? No Known Problems Sister    ??? Arthritis-osteo Brother    ??? No Known Problems Sister    ??? No Known Problems Sister    ??? No Known Problems Sister    ??? No Known Problems Brother    ??? Anesth Problems Neg Hx      Social History     Socioeconomic History   ??? Marital status: SINGLE     Spouse name: Not on file   ??? Number of children: Not on file   ??? Years of education: Not on file   ??? Highest education level: Not on file   Occupational History   ??? Not on file   Social Needs   ??? Financial resource strain: Not on file   ??? Food insecurity:     Worry: Not on file     Inability: Not on file   ??? Transportation needs:     Medical: Not on file     Non-medical: Not on file   Tobacco Use   ??? Smoking status: Former Smoker     Packs/day: 0.50     Years: 47.00     Pack years: 23.50     Last attempt to quit: 06/2016     Years since quitting: 2.1   ??? Smokeless tobacco: Never Used   Substance and Sexual Activity   ??? Alcohol use: No   ??? Drug use: No   ??? Sexual activity: Not on file   Lifestyle   ??? Physical activity:     Days per week: Not on file     Minutes per session: Not on file   ??? Stress: Not on file   Relationships   ??? Social connections:     Talks on phone: Not on file     Gets together: Not on file     Attends religious service: Not on file     Active member of club or organization: Not on file     Attends meetings of clubs or organizations: Not on file     Relationship status: Not on file   ??? Intimate  partner violence:     Fear of current or ex partner: Not on file     Emotionally abused: Not on file     Physically abused: Not on file     Forced sexual activity: Not on file   Other Topics Concern   ??? Not on file   Social History Narrative   ??? Not on file       Allergies: No Known Allergies    Prior to Admission Medications:  Prior to Admission medications    Medication Sig Start Date End Date Taking? Authorizing Provider  apixaban (ELIQUIS) 5 mg tablet Take 1 Tab by mouth two (2) times a day. 06/23/18   Mare Loan, NP   magnesium hydroxide (PHILLIPS MILK OF MAGNESIA) 400 mg/5 mL suspension Take 30 mL by mouth daily as needed for Constipation.    Provider, Historical   predniSONE (DELTASONE) 5 mg tablet Take 1 Tab by mouth daily. Restart on Jun 01, 2017 01/21/18   Mare Loan, NP   abiraterone (ZYTIGA) 500 mg tab Take 1,000 mg by mouth daily. 10/02/17   Mare Loan, NP   polyethylene glycol (MIRALAX) 17 gram packet Take 1 Packet by mouth daily. 08/25/17   Mare Loan, NP   magic mouthwash solution Magic mouth wash   Maalox  Lidocaine 2% viscous   Diphenhydramine oral solution   Nystatin 100,000 units    Pharmacy to mix equal portions of ingredients to a total volume as indicated in the dispense amount.  Swish and spit 90mL (1 tsp) four times daily as needed for mouth sores 08/25/17   Mare Loan, NP   senna (SENOKOT) 8.6 mg tablet Take 1 Tab by mouth nightly. Indications: constipation 05/25/17   Dorann Lodge, NP       Physical Exam:    Blood pressure 155/78, pulse 93, resp. rate 21, SpO2 99 %.  General: alert, cooperative, no distress, appears stated age  Heart: normal S1 and S2  Lungs: clear to auscultation bilaterally  Abdomen: soft, nontender, normal bowel sounds  Neuro: grossly intact  Extremities: extremities normal, atraumatic, no cyanosis or edema    Plan of Care/Planned Procedure:  Risks, benefits, and alternatives reviewed with patient and he agrees to proceed with the procedure.     Juanita Laster,  MD

## 2018-08-20 NOTE — Progress Notes (Signed)
Pt needs fu to discuss recent results next week  Please call him or friend [name] to schedule next week

## 2018-08-23 NOTE — Progress Notes (Signed)
Please call and schedule fu for pt ASAP.   Dr. Rosina Lowenstein schedule if available, if not place on mine.

## 2018-08-26 ENCOUNTER — Emergency Department: Admit: 2018-08-26 | Payer: MEDICARE | Primary: Internal Medicine

## 2018-08-26 ENCOUNTER — Ambulatory Visit: Admit: 2018-08-26 | Discharge: 2018-08-26 | Payer: MEDICARE | Attending: Registered Nurse | Primary: Internal Medicine

## 2018-08-26 ENCOUNTER — Inpatient Hospital Stay
Admit: 2018-08-26 | Discharge: 2018-09-03 | Disposition: A | Discharge status: Home Health | Payer: MEDICARE | Attending: Internal Medicine | Admitting: Internal Medicine

## 2018-08-26 ENCOUNTER — Inpatient Hospital Stay: Admit: 2018-08-26 | Payer: MEDICARE | Primary: Internal Medicine

## 2018-08-26 ENCOUNTER — Ambulatory Visit: Attending: Registered Nurse | Primary: Internal Medicine

## 2018-08-26 DIAGNOSIS — C3431 Malignant neoplasm of lower lobe, right bronchus or lung: Secondary | ICD-10-CM

## 2018-08-26 DIAGNOSIS — N179 Acute kidney failure, unspecified: Secondary | ICD-10-CM

## 2018-08-26 DIAGNOSIS — C61 Malignant neoplasm of prostate: Secondary | ICD-10-CM

## 2018-08-26 LAB — BASIC METABOLIC PANEL
Anion Gap: 9 mmol/L (ref 5–15)
BUN: 79 MG/DL — ABNORMAL HIGH (ref 6–20)
Bun/Cre Ratio: 9 — ABNORMAL LOW (ref 12–20)
CO2: 28 mmol/L (ref 21–32)
Calcium: 8.7 MG/DL (ref 8.5–10.1)
Chloride: 96 mmol/L — ABNORMAL LOW (ref 97–108)
Creatinine: 8.52 MG/DL — ABNORMAL HIGH (ref 0.70–1.30)
EGFR IF NonAfrican American: 6 mL/min/{1.73_m2} — ABNORMAL LOW (ref >60)
GFR African American: 8 mL/min/{1.73_m2} — ABNORMAL LOW (ref >60)
Glucose: 94 mg/dL (ref 65–100)
Potassium: 3.9 mmol/L (ref 3.5–5.1)
Sodium: 133 mmol/L — ABNORMAL LOW (ref 136–145)

## 2018-08-26 LAB — CBC WITH AUTO DIFFERENTIAL
Basophils %: 1 % (ref 0–1)
Basophils %: 1 % (ref 0–1)
Basophils Absolute: 0.1 10*3/uL (ref 0.0–0.1)
Basophils Absolute: 0.1 10*3/uL (ref 0.0–0.1)
Eosinophils %: 1 % (ref 0–7)
Eosinophils %: 2 % (ref 0–7)
Eosinophils Absolute: 0.1 10*3/uL (ref 0.0–0.4)
Eosinophils Absolute: 0.1 10*3/uL (ref 0.0–0.4)
Granulocyte Absolute Count: 0 10*3/uL (ref 0.00–0.04)
Granulocyte Absolute Count: 0 10*3/uL (ref 0.00–0.04)
Hematocrit: 25.7 % — ABNORMAL LOW (ref 36.6–50.3)
Hematocrit: 22.7 % — ABNORMAL LOW (ref 36.6–50.3)
Hemoglobin: 8.3 g/dL — ABNORMAL LOW (ref 12.1–17.0)
Hemoglobin: 7.3 g/dL — ABNORMAL LOW (ref 12.1–17.0)
Immature Granulocytes: 0 % (ref 0.0–0.5)
Immature Granulocytes: 0 % (ref 0.0–0.5)
Lymphocytes %: 13 % (ref 12–49)
Lymphocytes %: 13 % (ref 12–49)
Lymphocytes Absolute: 0.7 10*3/uL — ABNORMAL LOW (ref 0.8–3.5)
Lymphocytes Absolute: 0.7 10*3/uL — ABNORMAL LOW (ref 0.8–3.5)
MCH: 27.9 PG (ref 26.0–34.0)
MCH: 27.5 PG (ref 26.0–34.0)
MCHC: 32.3 g/dL (ref 30.0–36.5)
MCHC: 32.2 g/dL (ref 30.0–36.5)
MCV: 86.5 FL (ref 80.0–99.0)
MCV: 85.7 FL (ref 80.0–99.0)
MPV: 9.4 FL (ref 8.9–12.9)
MPV: 9.3 FL (ref 8.9–12.9)
Monocytes %: 8 % (ref 5–13)
Monocytes %: 12 % (ref 5–13)
Monocytes Absolute: 0.4 10*3/uL (ref 0.0–1.0)
Monocytes Absolute: 0.7 10*3/uL (ref 0.0–1.0)
NRBC Absolute: 0 10*3/uL (ref 0.00–0.01)
NRBC Absolute: 0 10*3/uL (ref 0.00–0.01)
Neutrophils %: 77 % — ABNORMAL HIGH (ref 32–75)
Neutrophils %: 72 % (ref 32–75)
Neutrophils Absolute: 4.3 10*3/uL (ref 1.8–8.0)
Neutrophils Absolute: 3.9 10*3/uL (ref 1.8–8.0)
Nucleated RBCs: 0 PER 100 WBC
Nucleated RBCs: 0 PER 100 WBC
Platelets: 256 10*3/uL (ref 150–400)
Platelets: 219 10*3/uL (ref 150–400)
RBC: 2.97 M/uL — ABNORMAL LOW (ref 4.10–5.70)
RBC: 2.65 M/uL — ABNORMAL LOW (ref 4.10–5.70)
RDW: 12.8 % (ref 11.5–14.5)
RDW: 12.9 % (ref 11.5–14.5)
WBC: 5.6 10*3/uL (ref 4.1–11.1)
WBC: 5.5 10*3/uL (ref 4.1–11.1)

## 2018-08-26 LAB — PROTIME-INR
INR: 1.2 — ABNORMAL HIGH (ref 0.9–1.1)
Protime: 11.8 s — ABNORMAL HIGH (ref 9.0–11.1)

## 2018-08-26 LAB — EKG 12-LEAD
Atrial Rate: 75 {beats}/min
Diagnosis: NORMAL
P Axis: 51 degrees
P-R Interval: 156 ms
Q-T Interval: 420 ms
QRS Duration: 86 ms
QTc Calculation (Bazett): 469 ms
R Axis: -7 degrees
T Axis: 50 degrees
Ventricular Rate: 75 {beats}/min

## 2018-08-26 LAB — URINALYSIS WITH MICROSCOPIC
BACTERIA, URINE: NEGATIVE /hpf
Bilirubin, Urine: NEGATIVE
Blood, Urine: NEGATIVE
Glucose, Ur: NEGATIVE mg/dL
Ketones, Urine: NEGATIVE mg/dL
Leukocyte Esterase, Urine: NEGATIVE
Nitrite, Urine: NEGATIVE
Protein, UA: NEGATIVE mg/dL
Specific Gravity, UA: 1.012 (ref 1.003–1.030)
Urobilinogen, UA, POCT: 0.2 EU/dL (ref 0.2–1.0)
pH, UA: 5 (ref 5.0–8.0)

## 2018-08-26 LAB — COMPREHENSIVE METABOLIC PANEL
ALT: 9 U/L — ABNORMAL LOW (ref 12–78)
AST: 26 U/L (ref 15–37)
Albumin/Globulin Ratio: 0.5 — ABNORMAL LOW (ref 1.1–2.2)
Albumin: 2.8 g/dL — ABNORMAL LOW (ref 3.5–5.0)
Alkaline Phosphatase: 88 U/L (ref 45–117)
Anion Gap: 9 mmol/L (ref 5–15)
BUN: 81 MG/DL — ABNORMAL HIGH (ref 6–20)
Bun/Cre Ratio: 9 — ABNORMAL LOW (ref 12–20)
CO2: 27 mmol/L (ref 21–32)
Calcium: 9.2 MG/DL (ref 8.5–10.1)
Chloride: 96 mmol/L — ABNORMAL LOW (ref 97–108)
Creatinine: 8.71 MG/DL — ABNORMAL HIGH (ref 0.70–1.30)
EGFR IF NonAfrican American: 6 mL/min/{1.73_m2} — ABNORMAL LOW (ref >60)
GFR African American: 7 mL/min/{1.73_m2} — ABNORMAL LOW (ref >60)
Globulin: 5.4 g/dL — ABNORMAL HIGH (ref 2.0–4.0)
Glucose: 97 mg/dL (ref 65–100)
Potassium: 4.6 mmol/L (ref 3.5–5.1)
Sodium: 132 mmol/L — ABNORMAL LOW (ref 136–145)
Total Bilirubin: 0.3 MG/DL (ref 0.2–1.0)
Total Protein: 8.2 g/dL (ref 6.4–8.2)

## 2018-08-26 LAB — TROPONIN: Troponin I: <0.05 ng/mL (ref <0.05)

## 2018-08-26 LAB — PROBNP, N-TERMINAL: BNP: 2675 PG/ML — ABNORMAL HIGH (ref <125)

## 2018-08-26 LAB — TYPE AND SCREEN
ABO/Rh: A POS
Antibody Screen: NEGATIVE

## 2018-08-26 LAB — APTT: aPTT: 36.3 s — ABNORMAL HIGH (ref 22.1–32.0)

## 2018-08-26 LAB — PSA PROSTATIC SPECIFIC ANTIGEN: Pros. Spec. Antigen: 0.1 ng/mL (ref 0.01–4.0)

## 2018-08-26 LAB — METABOLIC PANEL, BASIC
Anion gap: 9 mmol/L (ref 5–15)
BUN/Creatinine ratio: 9 — ABNORMAL LOW (ref 12–20)
BUN: 79 MG/DL — ABNORMAL HIGH (ref 6–20)
CO2: 28 mmol/L (ref 21–32)
Calcium: 8.7 MG/DL (ref 8.5–10.1)
Chloride: 96 mmol/L — ABNORMAL LOW (ref 97–108)
Creatinine: 8.52 MG/DL — ABNORMAL HIGH (ref 0.70–1.30)
GFR est AA: 8 mL/min/{1.73_m2} — ABNORMAL LOW (ref >60)
GFR est non-AA: 6 mL/min/{1.73_m2} — ABNORMAL LOW (ref >60)
Glucose: 94 mg/dL (ref 65–100)
Potassium: 3.9 mmol/L (ref 3.5–5.1)
Sodium: 133 mmol/L — ABNORMAL LOW (ref 136–145)

## 2018-08-26 LAB — URINALYSIS W/MICROSCOPIC
Bacteria: NEGATIVE /hpf
Bilirubin: NEGATIVE
Blood: NEGATIVE
Glucose: NEGATIVE mg/dL
Ketone: NEGATIVE mg/dL
Leukocyte Esterase: NEGATIVE
Nitrites: NEGATIVE
Protein: NEGATIVE mg/dL
Specific gravity: 1.012 (ref 1.003–1.030)
Urobilinogen: 0.2 EU/dL (ref 0.2–1.0)
pH (UA): 5 (ref 5.0–8.0)

## 2018-08-26 LAB — METABOLIC PANEL, COMPREHENSIVE
A-G Ratio: 0.5 — ABNORMAL LOW (ref 1.1–2.2)
ALT (SGPT): 9 U/L — ABNORMAL LOW (ref 12–78)
AST (SGOT): 26 U/L (ref 15–37)
Albumin: 2.8 g/dL — ABNORMAL LOW (ref 3.5–5.0)
Alk. phosphatase: 88 U/L (ref 45–117)
Anion gap: 9 mmol/L (ref 5–15)
BUN/Creatinine ratio: 9 — ABNORMAL LOW (ref 12–20)
BUN: 81 MG/DL — ABNORMAL HIGH (ref 6–20)
Bilirubin, total: 0.3 MG/DL (ref 0.2–1.0)
CO2: 27 mmol/L (ref 21–32)
Calcium: 9.2 MG/DL (ref 8.5–10.1)
Chloride: 96 mmol/L — ABNORMAL LOW (ref 97–108)
Creatinine: 8.71 MG/DL — ABNORMAL HIGH (ref 0.70–1.30)
GFR est AA: 7 mL/min/{1.73_m2} — ABNORMAL LOW (ref >60)
GFR est non-AA: 6 mL/min/{1.73_m2} — ABNORMAL LOW (ref >60)
Globulin: 5.4 g/dL — ABNORMAL HIGH (ref 2.0–4.0)
Glucose: 97 mg/dL (ref 65–100)
Potassium: 4.6 mmol/L (ref 3.5–5.1)
Protein, total: 8.2 g/dL (ref 6.4–8.2)
Sodium: 132 mmol/L — ABNORMAL LOW (ref 136–145)

## 2018-08-26 LAB — CBC WITH AUTOMATED DIFF
ABS. BASOPHILS: 0.1 10*3/uL (ref 0.0–0.1)
ABS. BASOPHILS: 0.1 10*3/uL (ref 0.0–0.1)
ABS. EOSINOPHILS: 0.1 10*3/uL (ref 0.0–0.4)
ABS. EOSINOPHILS: 0.1 10*3/uL (ref 0.0–0.4)
ABS. IMM. GRANS.: 0 10*3/uL (ref 0.00–0.04)
ABS. IMM. GRANS.: 0 10*3/uL (ref 0.00–0.04)
ABS. LYMPHOCYTES: 0.7 10*3/uL — ABNORMAL LOW (ref 0.8–3.5)
ABS. LYMPHOCYTES: 0.7 10*3/uL — ABNORMAL LOW (ref 0.8–3.5)
ABS. MONOCYTES: 0.4 10*3/uL (ref 0.0–1.0)
ABS. MONOCYTES: 0.7 10*3/uL (ref 0.0–1.0)
ABS. NEUTROPHILS: 4.3 10*3/uL (ref 1.8–8.0)
ABS. NEUTROPHILS: 3.9 10*3/uL (ref 1.8–8.0)
ABSOLUTE NRBC: 0 10*3/uL (ref 0.00–0.01)
ABSOLUTE NRBC: 0 10*3/uL (ref 0.00–0.01)
BASOPHILS: 1 % (ref 0–1)
BASOPHILS: 1 % (ref 0–1)
EOSINOPHILS: 1 % (ref 0–7)
EOSINOPHILS: 2 % (ref 0–7)
HCT: 25.7 % — ABNORMAL LOW (ref 36.6–50.3)
HCT: 22.7 % — ABNORMAL LOW (ref 36.6–50.3)
HGB: 8.3 g/dL — ABNORMAL LOW (ref 12.1–17.0)
HGB: 7.3 g/dL — ABNORMAL LOW (ref 12.1–17.0)
IMMATURE GRANULOCYTES: 0 % (ref 0.0–0.5)
IMMATURE GRANULOCYTES: 0 % (ref 0.0–0.5)
LYMPHOCYTES: 13 % (ref 12–49)
LYMPHOCYTES: 13 % (ref 12–49)
MCH: 27.9 PG (ref 26.0–34.0)
MCH: 27.5 PG (ref 26.0–34.0)
MCHC: 32.3 g/dL (ref 30.0–36.5)
MCHC: 32.2 g/dL (ref 30.0–36.5)
MCV: 86.5 FL (ref 80.0–99.0)
MCV: 85.7 FL (ref 80.0–99.0)
MONOCYTES: 8 % (ref 5–13)
MONOCYTES: 12 % (ref 5–13)
MPV: 9.4 FL (ref 8.9–12.9)
MPV: 9.3 FL (ref 8.9–12.9)
NEUTROPHILS: 77 % — ABNORMAL HIGH (ref 32–75)
NEUTROPHILS: 72 % (ref 32–75)
NRBC: 0 PER 100 WBC
NRBC: 0 PER 100 WBC
PLATELET: 256 10*3/uL (ref 150–400)
PLATELET: 219 10*3/uL (ref 150–400)
RBC: 2.97 M/uL — ABNORMAL LOW (ref 4.10–5.70)
RBC: 2.65 M/uL — ABNORMAL LOW (ref 4.10–5.70)
RDW: 12.8 % (ref 11.5–14.5)
RDW: 12.9 % (ref 11.5–14.5)
WBC: 5.6 10*3/uL (ref 4.1–11.1)
WBC: 5.5 10*3/uL (ref 4.1–11.1)

## 2018-08-26 LAB — SAMPLES BEING HELD: SAMPLES BEING HELD: 1

## 2018-08-26 LAB — TYPE & SCREEN
ABO/Rh(D): A POS
Antibody screen: NEGATIVE

## 2018-08-26 LAB — EKG, 12 LEAD, INITIAL
Atrial Rate: 75 {beats}/min
Calculated P Axis: 51 degrees
Calculated R Axis: -7 degrees
Calculated T Axis: 50 degrees
Diagnosis: NORMAL
P-R Interval: 156 ms
Q-T Interval: 420 ms
QRS Duration: 86 ms
QTC Calculation (Bezet): 469 ms
Ventricular Rate: 75 {beats}/min

## 2018-08-26 LAB — PROTHROMBIN TIME + INR
INR: 1.2 — ABNORMAL HIGH (ref 0.9–1.1)
Prothrombin time: 11.8 s — ABNORMAL HIGH (ref 9.0–11.1)

## 2018-08-26 LAB — TROPONIN I: Troponin-I, Qt.: <0.05 ng/mL (ref <0.05)

## 2018-08-26 LAB — PTT: aPTT: 36.3 s — ABNORMAL HIGH (ref 22.1–32.0)

## 2018-08-26 LAB — NT-PRO BNP: NT pro-BNP: 2675 PG/ML — ABNORMAL HIGH (ref <125)

## 2018-08-26 LAB — PSA, DIAGNOSTIC (PROSTATE SPECIFIC AG): Prostate Specific Ag: 0.1 ng/mL (ref 0.01–4.0)

## 2018-08-26 LAB — URINE CULTURE HOLD SAMPLE

## 2018-08-26 MED ORDER — SODIUM CHLORIDE 0.9 % IJ SYRG
INTRAMUSCULAR | Status: DC | PRN
Start: 2018-08-26 — End: 2018-09-03
  Administered 2018-09-02: 17:00:00 via INTRAVENOUS

## 2018-08-26 MED ORDER — SODIUM CHLORIDE 0.9% BOLUS IV
0.9 % | INTRAVENOUS | Status: AC
Start: 2018-08-26 — End: 2018-08-26
  Administered 2018-08-26: 20:00:00 via INTRAVENOUS

## 2018-08-26 MED ORDER — ONDANSETRON (PF) 4 MG/2 ML INJECTION
4 mg/2 mL | INTRAMUSCULAR | Status: DC | PRN
Start: 2018-08-26 — End: 2018-09-03

## 2018-08-26 MED ORDER — HEPARIN (PORCINE) IN D5W 25,000 UNIT/250 ML IV
25000 unit/250 mL(100 unit/mL) | INTRAVENOUS | Status: DC
Start: 2018-08-26 — End: 2018-08-31
  Administered 2018-08-26 – 2018-08-31 (×15): via INTRAVENOUS

## 2018-08-26 MED ORDER — PREDNISONE 5 MG TAB
5 mg | Freq: Every day | ORAL | Status: DC
Start: 2018-08-26 — End: 2018-09-03
  Administered 2018-08-27 – 2018-09-03 (×8): via ORAL

## 2018-08-26 MED ORDER — SENNOSIDES 8.6 MG TAB
8.6 mg | Freq: Every evening | ORAL | Status: DC
Start: 2018-08-26 — End: 2018-09-03
  Administered 2018-08-27 – 2018-09-03 (×8): via ORAL

## 2018-08-26 MED ORDER — SODIUM CHLORIDE 0.9 % IJ SYRG
Freq: Three times a day (TID) | INTRAMUSCULAR | Status: DC
Start: 2018-08-26 — End: 2018-09-03
  Administered 2018-08-27 – 2018-09-03 (×23): via INTRAVENOUS

## 2018-08-26 MED ORDER — HEPARIN (PORCINE) 5,000 UNIT/ML IJ SOLN
5000 unit/mL | Freq: Once | INTRAMUSCULAR | Status: AC
Start: 2018-08-26 — End: 2018-08-26
  Administered 2018-08-26: 21:00:00 via INTRAVENOUS

## 2018-08-26 MED ORDER — LORAZEPAM 0.5 MG TAB
0.5 mg | Freq: Two times a day (BID) | ORAL | Status: DC | PRN
Start: 2018-08-26 — End: 2018-09-03

## 2018-08-26 MED ORDER — OXYCODONE 5 MG TAB
5 mg | ORAL | Status: DC | PRN
Start: 2018-08-26 — End: 2018-09-03
  Administered 2018-08-27 – 2018-09-02 (×5): via ORAL

## 2018-08-26 MED FILL — MONOJECT PREFILL ADVANCED 0.9 % SODIUM CHLORIDE INJECTION SYRINGE: INTRAMUSCULAR | Qty: 40

## 2018-08-26 MED FILL — HEPARIN (PORCINE) 5,000 UNIT/ML IJ SOLN: 5000 unit/mL | INTRAMUSCULAR | Qty: 2

## 2018-08-26 MED FILL — HEPARIN (PORCINE) IN D5W 25,000 UNIT/250 ML IV: 25000 unit/250 mL(100 unit/mL) | INTRAVENOUS | Qty: 250

## 2018-08-26 MED FILL — SODIUM CHLORIDE 0.9 % IV: INTRAVENOUS | Qty: 1000

## 2018-08-26 NOTE — Progress Notes (Signed)
Sent to the ER for acute renal failure

## 2018-08-26 NOTE — ED Notes (Signed)
1445 Pt transported to Vascular via stretcher.

## 2018-08-26 NOTE — Progress Notes (Signed)
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 Convey  is a 68 y.o. male who is  on Eliquis for a h/o DVT who presented to the Emergency Department on 04/22/16 with nausea, vomiting, and abdominal pain x 2 months, decreased appetite and weight loss of 10-14 lbs.   CTA revealed bilateral lung nodules and mediastinal/hilar adenopathy. PET revealed supraclavicular adenopathy, retroperitoneal adenopathy,  bilateral iliac adenopathy, mediastinal/hilar adenopathy, bony metastatic disease, right lower lobe pulmonary mass, and multiple other smaller masses in bilateral lungs. USG guided bx of supraclavicular  LN showed adenocarcinoma of the prostate. He was also diagnosed and treated for CAP. PSA on 05/26/16-2400. Was discussed in tumor board, Napsin A negative and a small cell component ruled out. Initiated Degarelix  05/26/16. MRI spine with expansile C7 lesion,  T12 lesion encroaching epidural space, S1-S2 lesion encroaching neural foramina. Started palliative C6-T5 and T11-L5 XRT under Dr. Leron Croak on 06/09/16. Started Zytiga 08/28/16 with evidence of response. Noted to have a  Left lower lobe lung nodule on  CT done 12/22/16. Biopsy consistent with Lung adenocarcinoma. He had a mediastinoscopy with LN being negative for metastatic disease. Robotic RLL lobectomy performed on 05/22/17.       He underwent a robotic right lower lobectomy on 05/22/17 with Dr. Glo Herring. Pathology showed adenocarcinoma T3N2 stage IIIB. Case discussed at tumor board and there is no plan for adjuvant chemotherapy  at this time. He completed post op radiation with  Dr. Leron Croak on 09/18/17.       Comes in today for follow-up. He is not well. He has increased pain in his RUQ that radiates to epigastric area. Taking pecocet which is not helping. He has SOB with minimal exertion. Per friend [name] he can barely walk to the bathroom. His right leg  is swollen from his foot all the way up to scrotum. He never saw a urologist after imaging on 11/26 where hydronephrosis was noted. He continues to take Eliquis and has not missed any doses. He has a poor appetite and has lost weight. He denies diarrhea  and has intermittent constipation. Continues to take Zytiga + Prednisone daily.       Otherwise, complete ROS is per the symptom report form which has been scanned into the media section of the electronic medical record.        Past Medical History:        Diagnosis  Date         ?  Lung cancer ([place])       ?  Prostate cancer ([place])       ?  Stroke [facility])            PT STATES, "I HAD A STROKE A LONG TIME AGO"  Past Surgical History:         Procedure  Laterality  Date          ?  CHEST SURGERY PROCEDURE UNLISTED    04/27/2017          BRONCHOSCOPY/MEDIASTINOSCOPY          ?  HX GI              HERNIA REPAIR          ?  HX ORTHOPAEDIC    1994          ROD IN RIGHT LEG           No Known Allergies        Current Outpatient Medications          Medication  Sig  Dispense  Refill           ?  apixaban (ELIQUIS) 5 mg tablet  Take 1 Tab by mouth two (2) times a day.  60 Tab  12     ?  magnesium hydroxide (PHILLIPS MILK OF MAGNESIA) 400 mg/5 mL suspension  Take 30 mL by mouth daily as needed for Constipation.         ?  predniSONE (DELTASONE) 5 mg tablet  Take 1 Tab by mouth daily. Restart on Jun 01, 2017  30 Tab  6     ?  abiraterone (ZYTIGA) 500 mg tab  Take 1,000 mg by mouth daily.  60 Tab  3     ?  polyethylene glycol (MIRALAX) 17 gram packet  Take 1 Packet by mouth daily.  30 Packet  2     ?  magic mouthwash solution  Magic mouth  wash    Maalox   Lidocaine 2% viscous    Diphenhydramine oral solution    Nystatin 100,000 units      Pharmacy to mix equal portions of ingredients to a total volume as indicated in the dispense amount.   Swish and spit 40m (1 tsp) four times daily as needed for mouth sores  480 mL  3           ?  senna (SENOKOT) 8.6 mg tablet  Take 1 Tab by mouth nightly. Indications: constipation  30 Tab  0             Social History          Socioeconomic History         ?  Marital status:  SINGLE              Spouse name:  Not on file         ?  Number of children:  Not on file     ?  Years of education:  Not on file     ?  Highest education level:  Not on file       Tobacco Use         ?  Smoking status:  Former Smoker              Packs/day:  0.50         Years:  47.00         Pack years:  23.50         Last attempt to quit:  06/2016         Years since quitting:  2.1         ?  Smokeless tobacco:  Never Used  Substance and Sexual Activity         ?  Alcohol use:  No         ?  Drug use:  No             Family History         Problem  Relation  Age of Onset          ?  Cancer  Mother                COLON          ?  Cancer  Father                BRAIN TUMOR          ?  No Known Problems  Sister       ?  Arthritis-osteo  Brother       ?  No Known Problems  Sister       ?  No Known Problems  Sister       ?  No Known Problems  Sister       ?  No Known Problems  Brother            ?  Anesth Problems  Neg Hx          ROS   A review of systems was obtained and is negative except as listed in HPI.   ECOG PS is 1      Physical Examination:    Visit Vitals      BP  130/90 (BP 1 Location: Left arm)     Pulse  87     Temp  97.4 ??F (36.3 ??C)     Ht  5' 7" (1.702 m)     Wt  145 lb 9.6 oz (66 kg)     SpO2  96%        BMI  22.80 kg/m??        General appearance - alert, and in no distress, appears fatigued and ill   Mental status - oriented to person, place, and time   Mouth - mucous membranes moist, no lesions noted   Neck - supple, no  significant adenopathy   Resp-CTA, RLL diminished, diminished, normal respiratory effort   ABD- soft, TTP over RUQ   Neurological - normal speech, no focal findings or movement disorder noted   Musculoskeletal - no joint tenderness, deformity or swelling   Extremities - peripheral pulses normal, 3+ right LE edema up to scrotum, 2+ LLE edema, no clubbing or cyanosis   Skin - robotic incision site and previous chest tube site on right side of chest appear to be healing appropriately      LABS     Lab Results         Component  Value  Date/Time            WBC  4.8  07/05/2018 08:50 AM       HGB  10.6 (L)  07/05/2018 08:50 AM       HCT  32.2 (L)  07/05/2018 08:50 AM       PLATELET  185  07/05/2018 08:50 AM       MCV  86.6  07/05/2018 08:50 AM            ABS. NEUTROPHILS  3.2  07/05/2018 08:50 AM          Lab Results         Component  Value  Date/Time            Sodium  140  07/05/2018 08:50 AM       Potassium  3.3 (L)  07/05/2018 08:50 AM       Chloride  105  07/05/2018 08:50 AM       CO2  28  07/05/2018 08:50 AM       Glucose  82  07/05/2018 08:50 AM       BUN  8  07/05/2018 08:50 AM       Creatinine  0.74  07/05/2018 08:50 AM       GFR est AA  >60  07/05/2018 08:50 AM       GFR est non-AA  >60  07/05/2018 08:50 AM            Calcium  9.0  07/05/2018 08:50 AM          Lab Results         Component  Value  Date/Time            AST (SGOT)  20  07/05/2018 08:50 AM       Alk. phosphatase  104  07/05/2018 08:50 AM       Protein, total  7.1  07/05/2018 08:50 AM       Albumin  3.3 (L)  07/05/2018 08:50 AM       Globulin  3.8  07/05/2018 08:50 AM            A-G Ratio  0.9 (L)  07/05/2018 08:50 AM          Recent Labs                07/05/18   0850  05/11/18   0959  03/24/18   0949  02/17/18   0851  01/01/18   0811  11/27/17   0806     PSA  0.0*   --   0.1  0.1  0.1  0.1             PSALT   --   <0.1   --    --    --    --            IMAGING   PET CT  03/23/17   IMPRESSION: Mass lesion right lower lobe is hypermetabolic and  compatible with   the biopsy confirmed the result of adenocarcinoma. There are soft tissue   densities in the anterior abdominal wall bilaterally which are stable compared   to the prior chest abdomen pelvis CT but new compared to the prior PET/CT.   Please correlate with any recent abdominal wall surgery as these may represent   port sites. Otherwise normal tracer distribution. Stable sclerotic osseous   metastatic disease without abnormal activity.      PATHOLOGY   Supraclavicular node    CYTOLOGIC INTERPRETATION:    Adenocarcinoma, consistent with prostate primary    See comment    General Categorization    Positive for malignancy.    Specimen Adequacy    Satisfactory for evaluation.    Comment    Touch preps and a core biopsy are examined and show islands of adenocarcinoma surrounded by fibrous stroma. A panel of immunohistochemical stains was performed  to evaluate site of origin. The tumor cells are focally positive for PSA and PSAP and negative for CK7, CK20, TTF-1      CT CAP 03/22/18   IMPRESSION   IMPRESSION:  1. Status post right lung surgery with right perihilar consolidation and   scarring consistent with post surgery and radiation therapy change. These   findings are stable.   2. Enlarged prostate.   3. Sclerotic bone metastases unchanged.   4. Atherosclerotic aorta without aneurysm      CT CAP 07/13/18:    IMPRESSION:    1. Increased size of the prostate and increased nodularity involving the bladder   base and bladder wall.   2. New left hydronephrosis and hydroureter with obstruction at the level of the   ureterovesical junction.   3. Status post right lung surgery with elevation of the right hemidiaphragm and   surgical staples and scarring in the right hilum consistent with postsurgical   and postradiation therapy effect, unchanged.   4. Sclerotic bone metastases to the spine unchanged.   5. New low-attenuation liver lesion could represent a metastasis.   6. Dilated fluid-filled esophagus  suggesting esophageal dysmotility with   thickened wall possibly secondary to radiation therapy.   7. Atherosclerotic aorta with coronary artery calcifications. No abdominal   aortic aneurysm.      Pathology from liver biopsy 08/17/18:    Specimen Source    1: Liver, Core biopsy with touch intepretation:    CYTOLOGIC INTERPRETATION:    1. Liver, Core biopsy with touch intepretation:    Poorly differentiated adenocarcinoma consistent with metastatic pulmonary adenocarcinoma    See comment    General Categorization    Positive for malignancy.       ASSESSMENT   Mr. Damiano is a  68 y.o. male with widely metastatic prostate adenocarcinoma and newly diagnosed lung adenocarcinoma  status post right lower lobe lobectomy on 05/22/17 and presents today for follow-up.       PLAN      Prostate cancer   Castrate sensitive, treatment na??ve widely metastatic prostate cancer to lymph nodes above and below the diaphragm and bones.   Prostate adenocarcinoma diagnosed on biopsy of the supraclavicular lymph node. Discussed in tumor board and a small cell component has been ruled out.   PSA at the time of diagnosis at 2400.      Currently on Lupron 45 mg every 6 months. On Zytiga 1,075m daily + prednisone 5 mg daily per [place].    Lupron q6 months      PSA pending today however last imaging and clinical presentation is concerning for progression of prostate cancer as well as #2        R lung adenocarcinoma, T3N2MX stage IIIB- Parietal pleural involvement-  PD-L1 99%   S/p RLL lobectomy on 10/5   Very high risk of recurrence   Case was discussed in tumor board and the consensus was to offer PORT with N2 disease   PORT with Dr. PLeron CroakCompleted  09/18/17.       Repeat imaging on 07/13/18 shows new liver lesion which has since been biopsied and positive for adenocarcinoma of lung.    He has been poorly compliant having imaging/biopsy scheduled as well as follow-up and he is likely rapidly progressing possible from both lung and  prostate cancer.       He will need repeat imaging with CT CAP while inpatient. Also may need MRI due to hyponatremia.       Bone metastases   Diffuse   Completed palliative radiation that was initiated on 06/09/16   Hold off biphosphonates due to poor dental hygiene   Pt did not complete bone scan as requested  DVT   Diagnosed 04/22/16   Cancer associated   On lifelong Eliquis 2m BID    Will need venous duplex to rule out RLL DVT while hospitalized as well as CT to rule out PE due to SOB      RUQ pain   New liver mets as of 11/26   May be progressed since then, needs repeat imaging inpatient    Pain management       Anemia    Worsening, likely due to progressive cancer      Elevated Cr with left hydronephrosis    Nearly 9 today, sending pt to ER    Will need nephrology consult and urology eval for hydronephrosis       Hyponatremia   See above       Recommend ER and admission for uncontrolled pain and management of hydronephrosis and repeat imaging of CAP due to likely rapidly progressing lung and prostate cancer

## 2018-08-26 NOTE — Progress Notes (Signed)
Received pt from ED via stretcher. VS noted and pt placed on monitor. Dual skin assessment completed with Arbie Cookey, RN. Skin intact. Dry areas noted to bilateral feet. Lotion applied. Right leg 3-4+ edema. At present pt denies pain. BP elevated. MD aware and asked to recheck BP after pt settled onto unit.

## 2018-08-26 NOTE — ED Provider Notes (Addendum)
68 y.o. male with past medical history significant for lung cancer, prostate cancer, and stroke who presents via POV from his oncology office with chief complaint of shortness of breath. Pt was seen by his oncologist this morning and advised to come to ED due to R lower extremity swelling, and increased shortness of breath that is worse with exertion. 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 Pt was c/o persistent RUQ pain, which triggered the biopsy. Pt now also c/o decreased appetite, difficulty sleeping, difficulty lying flat, hematuria, constipation (LBM 3 days ago), difficulty urinating, and scrotal swelling. Pt is anti-coagulated on Eliquis. He has hx of R lower lobectomy in 2018 with dr Glo Herring. Pt does not have a port present at this time. He denies hx of CHF. There are no other acute medical concerns at this time.  Social hx: former tobacco smoker (quit 2017); denies EtOH use; denies illicit drug use.    PCP: Juliene Pina, MD      Note written by Amanda Cockayne, Scribe, as dictated by Milon Dikes, MD 2:22 PM      The history is provided by the patient and the spouse. No language interpreter was used.        Past Medical History:   Diagnosis Date   ??? Lung cancer ([place])    ??? Prostate cancer ([place])    ??? Stroke ([place])     PT STATES, "I HAD A STROKE A LONG TIME AGO"       Past Surgical History:   Procedure Laterality Date   ??? CHEST SURGERY PROCEDURE UNLISTED  04/27/2017    BRONCHOSCOPY/MEDIASTINOSCOPY   ??? HX GI      HERNIA REPAIR   ??? HX ORTHOPAEDIC  1994    ROD IN RIGHT LEG         Family History:   Problem Relation Age of Onset   ??? Cancer Mother         COLON   ??? Cancer Father         BRAIN TUMOR   ??? No Known Problems Sister    ??? Arthritis-osteo Brother    ??? No Known Problems Sister    ??? No Known Problems Sister    ??? No Known Problems Sister    ??? No Known Problems Brother     ??? Anesth Problems Neg Hx        Social History     Socioeconomic History   ??? Marital status: SINGLE     Spouse name: Not on file   ??? Number of children: Not on file   ??? Years of education: Not on file   ??? Highest education level: Not on file   Occupational History   ??? Not on file   Social Needs   ??? Financial resource strain: Not on file   ??? Food insecurity:     Worry: Not on file     Inability: Not on file   ??? Transportation needs:     Medical: Not on file     Non-medical: Not on file   Tobacco Use   ??? Smoking status: Former Smoker     Packs/day: 0.50     Years: 47.00     Pack years: 23.50     Last attempt to quit: 06/2016     Years since quitting: 2.1   ??? Smokeless tobacco: Never Used  Substance and Sexual Activity   ??? Alcohol use: No   ??? Drug use: No   ??? Sexual activity: Not on file   Lifestyle   ??? Physical activity:     Days per week: Not on file     Minutes per session: Not on file   ??? Stress: Not on file   Relationships   ??? Social connections:     Talks on phone: Not on file     Gets together: Not on file     Attends religious service: Not on file     Active member of club or organization: Not on file     Attends meetings of clubs or organizations: Not on file     Relationship status: Not on file   ??? Intimate partner violence:     Fear of current or ex partner: Not on file     Emotionally abused: Not on file     Physically abused: Not on file     Forced sexual activity: Not on file   Other Topics Concern   ??? Not on file   Social History Narrative   ??? Not on file         ALLERGIES: Patient has no known allergies.    Review of Systems   Constitutional: Positive for appetite change. Negative for fever.   Respiratory: Negative for shortness of breath.    Cardiovascular: Positive for leg swelling.   Gastrointestinal: Positive for abdominal pain and constipation. Negative for blood in stool.   Genitourinary: Positive for difficulty urinating, hematuria and scrotal swelling.    Psychiatric/Behavioral: Positive for sleep disturbance.   All other systems reviewed and are negative.      Vitals:    08/26/18 1305   BP: 128/84   Pulse: 82   Resp: 20   Temp: 98.1 ??F (36.7 ??C)   SpO2: 100%            Physical Exam  Vitals signs and nursing note reviewed.   Constitutional:       Appearance: He is well-developed.      Comments: Cachectic   HENT:      Head: Normocephalic and atraumatic.   Eyes:      Conjunctiva/sclera: Conjunctivae normal.   Neck:      Musculoskeletal: Normal range of motion.   Cardiovascular:      Rate and Rhythm: Normal rate and regular rhythm.      Pulses: Normal pulses.      Heart sounds: Normal heart sounds. No murmur.   Pulmonary:      Effort: Pulmonary effort is normal. No respiratory distress.      Breath sounds: Normal breath sounds. No stridor. No wheezing, rhonchi or rales.   Chest:      Chest wall: No tenderness.   Abdominal:      General: Bowel sounds are normal. There is no distension.      Palpations: Abdomen is soft. There is no mass.      Tenderness: There is no tenderness. There is no guarding or rebound.      Hernia: No hernia is present.   Musculoskeletal: Normal range of motion.         General: Swelling present.      Comments: Right lower extremity 3+ edema up to the level of the scrotum   Skin:     General: Skin is warm and dry.   Neurological:      Mental Status: He is alert and oriented to  person, place, and time.      Cranial Nerves: No cranial nerve deficit.      Sensory: No sensory deficit.      Motor: No weakness.      Coordination: Coordination normal.          MDM  Number of Diagnoses or Management Options  Acute deep vein thrombosis (DVT) of femoral vein of right lower extremity ([place]):   AKI (acute kidney injury) [facility]):   Lower extremity edema:   Urinary retention:   Diagnosis management comments: Turn for PE and DVT by oncologist.  However patient is complaining of right-sided pain I will get a CT scan to  evaluate for complication from patient's biopsy done on 12/31.  We will also get a Doppler to rule out clot in patient's right leg which would be unlikely as he is on Eliquis.  It appears his creatinine is elevated from his oncologist office today.  Given history of enlarged prostate I will place a Foley as I suspect he has urinary retention.  He will need admission.       Amount and/or Complexity of Data Reviewed  Clinical lab tests: ordered and reviewed  Tests in the radiology section of CPT??: ordered  Obtain history from someone other than the patient: yes (Significant other)  Review and summarize past medical records: yes  Discuss the patient with other providers: yes (Hospitalist)    Critical Care  Total time providing critical care: 30-74 minutes (Total critical care time spent exclusive of procedures: 35)    Patient Progress  Patient progress: stable         Procedures    ED EKG interpretation:  Rhythm: normal sinus rhythm; and regular . Rate (approx.): 75; Axis: normal; P wave: normal; QRS interval: normal ; ST/T wave: T wave inverted; inverted in v3  EKG documented by Milon Dikes, MD, scribe, as interpreted by Milon Dikes, MD, ED MD.    [place] for Admission  3:41 PM    ED Room Number: ER21/21  Patient Name and age:  Bruce Clark 68 y.o.  male  Working Diagnosis:   1. AKI (acute kidney injury) ([place])    2. Lower extremity edema      Readmission: no  Isolation Requirements:  no  Recommended Level of Care:  telemetry  Code Status:  Full Code  Department:[facility] Adult ED - [phone]  Other: Patient with history of prostate cancer and lung cancer seen by his oncologist today and referred here for increasing shortness of breath and right lower extremity edema.  Concern was for PE however labs from the office show acute kidney injury.  Patient did have urinary retention and Foley was placed.  His creatinine today is 8.7.  It was normal prior.  He  will not be able to have a PE CT so I did order Doppler of his leg.  It was just completed but has not been read.  His sats are fine he is in no distress.  I ordered noncontrast imaging as he had a liver biopsy and was complaining of right-sided rib/lung pain to ensure he has no complication however his belly is soft.  The biopsy was done on 1231.     3:55 PM  Spoke with Dr. Nancy Fetter, hospitalist via [facility] text for admission.

## 2018-08-26 NOTE — Progress Notes (Signed)
Patient in Vascular

## 2018-08-26 NOTE — Progress Notes (Signed)
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 male who is on Eliquis for a h/o DVT who presented to the Emergency Department on 04/22/16 with nausea, vomiting, and abdominal pain x 2 months, decreased appetite and weight loss of 10-14 lbs.  CTA revealed bilateral lung nodules and mediastinal/hilar adenopathy. PET revealed supraclavicular adenopathy, retroperitoneal adenopathy, bilateral iliac adenopathy, mediastinal/hilar adenopathy, bony metastatic disease, right lower lobe pulmonary mass, and multiple other smaller masses in bilateral lungs. USG guided bx of supraclavicular LN showed adenocarcinoma of the prostate. He was also diagnosed and treated for CAP. PSA on 05/26/16-2400. Was discussed in tumor board, Napsin A negative and a small cell component ruled out. Initiated Degarelix  05/26/16. MRI spine with expansile C7 lesion, T12 lesion encroaching epidural space, S1-S2 lesion encroaching neural foramina. Started palliative C6-T5 and T11-L5 XRT under Dr. Leron Croak on 06/09/16. Started Zytiga 08/28/16 with evidence of response. Noted to have a  Left lower lobe lung nodule on CT done 12/22/16. Biopsy consistent with Lung adenocarcinoma. He had a mediastinoscopy with LN being negative for metastatic disease. Robotic RLL lobectomy performed on 05/22/17.     He underwent a robotic right lower lobectomy on 05/22/17 with Dr. Glo Herring. Pathology showed adenocarcinoma T3N2 stage IIIB. Case discussed at tumor board and there is no plan for adjuvant chemotherapy at this time. He completed post op radiation with Dr. Leron Croak on 09/18/17.     Comes in today for follow-up. He is not well. He has increased pain in his RUQ that radiates to epigastric area. Taking pecocet which is not helping. He has SOB with minimal exertion. Per friend [name] he can barely walk to  the bathroom. His right leg is swollen from his foot all the way up to scrotum. He never saw a urologist after imaging on 11/26 where hydronephrosis was noted. He continues to take Eliquis and has not missed any doses. He has a poor appetite and has lost weight. He denies diarrhea and has intermittent constipation. Continues to take Zytiga + Prednisone daily.     Otherwise, complete ROS is per the symptom report form which has been scanned into the media section of the electronic medical record.    Past Medical History:   Diagnosis Date   ??? Lung cancer ([place])    ??? Prostate cancer ([place])    ??? Stroke ([place])     PT STATES, "I HAD A STROKE A LONG TIME AGO"       Past Surgical History:   Procedure Laterality Date   ??? CHEST SURGERY PROCEDURE UNLISTED  04/27/2017    BRONCHOSCOPY/MEDIASTINOSCOPY   ??? HX GI      HERNIA REPAIR   ??? HX ORTHOPAEDIC  1994    ROD IN RIGHT LEG       No Known Allergies    Current Outpatient Medications   Medication Sig Dispense Refill   ??? apixaban (ELIQUIS) 5 mg tablet Take 1 Tab by mouth two (2) times a day. 60 Tab 12   ??? magnesium hydroxide (PHILLIPS MILK OF MAGNESIA) 400 mg/5 mL suspension Take 30 mL by mouth daily as needed for Constipation.     ??? predniSONE (DELTASONE) 5 mg tablet Take 1 Tab by mouth daily. Restart on Jun 01, 2017 30 Tab 6   ??? abiraterone (ZYTIGA) 500 mg tab Take  1,000 mg by mouth daily. 60 Tab 3   ??? polyethylene glycol (MIRALAX) 17 gram packet Take 1 Packet by mouth daily. 30 Packet 2   ??? magic mouthwash solution Magic mouth wash   Maalox  Lidocaine 2% viscous   Diphenhydramine oral solution   Nystatin 100,000 units    Pharmacy to mix equal portions of ingredients to a total volume as indicated in the dispense amount.  Swish and spit 71m (1 tsp) four times daily as needed for mouth sores 480 mL 3   ??? senna (SENOKOT) 8.6 mg tablet Take 1 Tab by mouth nightly. Indications: constipation 30 Tab 0       Social History     Socioeconomic History   ??? Marital status: SINGLE      Spouse name: Not on file   ??? Number of children: Not on file   ??? Years of education: Not on file   ??? Highest education level: Not on file   Tobacco Use   ??? Smoking status: Former Smoker     Packs/day: 0.50     Years: 47.00     Pack years: 23.50     Last attempt to quit: 06/2016     Years since quitting: 2.1   ??? Smokeless tobacco: Never Used   Substance and Sexual Activity   ??? Alcohol use: No   ??? Drug use: No       Family History   Problem Relation Age of Onset   ??? Cancer Mother         COLON   ??? Cancer Father         BRAIN TUMOR   ??? No Known Problems Sister    ??? Arthritis-osteo Brother    ??? No Known Problems Sister    ??? No Known Problems Sister    ??? No Known Problems Sister    ??? No Known Problems Brother    ??? Anesth Problems Neg Hx      ROS  A review of systems was obtained and is negative except as listed in HPI.  ECOG PS is 1    Physical Examination:   Visit Vitals  BP 130/90 (BP 1 Location: Left arm)   Pulse 87   Temp 97.4 ??F (36.3 ??C)   Ht 5' 7" (1.702 m)   Wt 145 lb 9.6 oz (66 kg)   SpO2 96%   BMI 22.80 kg/m??     General appearance - alert, and in no distress, appears fatigued and ill  Mental status - oriented to person, place, and time  Mouth - mucous membranes moist, no lesions noted  Neck - supple, no significant adenopathy  Resp-CTA, RLL diminished, diminished, normal respiratory effort  ABD- soft, TTP over RUQ  Neurological - normal speech, no focal findings or movement disorder noted  Musculoskeletal - no joint tenderness, deformity or swelling  Extremities - peripheral pulses normal, 3+ right LE edema up to scrotum, 2+ LLE edema, no clubbing or cyanosis  Skin - robotic incision site and previous chest tube site on right side of chest appear to be healing appropriately    LABS  Lab Results   Component Value Date/Time    WBC 4.8 07/05/2018 08:50 AM    HGB 10.6 (L) 07/05/2018 08:50 AM    HCT 32.2 (L) 07/05/2018 08:50 AM    PLATELET 185 07/05/2018 08:50 AM    MCV 86.6 07/05/2018 08:50 AM     ABS. NEUTROPHILS 3.2 07/05/2018 08:50 AM  Lab Results   Component Value Date/Time    Sodium 140 07/05/2018 08:50 AM    Potassium 3.3 (L) 07/05/2018 08:50 AM    Chloride 105 07/05/2018 08:50 AM    CO2 28 07/05/2018 08:50 AM    Glucose 82 07/05/2018 08:50 AM    BUN 8 07/05/2018 08:50 AM    Creatinine 0.74 07/05/2018 08:50 AM    GFR est AA >60 07/05/2018 08:50 AM    GFR est non-AA >60 07/05/2018 08:50 AM    Calcium 9.0 07/05/2018 08:50 AM     Lab Results   Component Value Date/Time    AST (SGOT) 20 07/05/2018 08:50 AM    Alk. phosphatase 104 07/05/2018 08:50 AM    Protein, total 7.1 07/05/2018 08:50 AM    Albumin 3.3 (L) 07/05/2018 08:50 AM    Globulin 3.8 07/05/2018 08:50 AM    A-G Ratio 0.9 (L) 07/05/2018 08:50 AM     Recent Labs     07/05/18  0850 05/11/18  0959 03/24/18  0949 02/17/18  0851 01/01/18  0811 11/27/17  0806   PSA 0.0*  --  0.1 0.1 0.1 0.1   PSALT  --  <0.1  --   --   --   --        IMAGING  PET CT  03/23/17  IMPRESSION: Mass lesion right lower lobe is hypermetabolic and compatible with  the biopsy confirmed the result of adenocarcinoma. There are soft tissue  densities in the anterior abdominal wall bilaterally which are stable compared  to the prior chest abdomen pelvis CT but new compared to the prior PET/CT.  Please correlate with any recent abdominal wall surgery as these may represent  port sites. Otherwise normal tracer distribution. Stable sclerotic osseous  metastatic disease without abnormal activity.    PATHOLOGY  Supraclavicular node   CYTOLOGIC INTERPRETATION:   Adenocarcinoma, consistent with prostate primary   See comment   General Categorization   Positive for malignancy.   Specimen Adequacy   Satisfactory for evaluation.   Comment   Touch preps and a core biopsy are examined and show islands of adenocarcinoma surrounded by fibrous stroma. A panel of immunohistochemical stains was performed to evaluate site of origin. The  tumor cells are focally positive for PSA and PSAP and negative for CK7, CK20, TTF-1    CT CAP 03/22/18  IMPRESSION  IMPRESSION:   1. Status post right lung surgery with right perihilar consolidation and  scarring consistent with post surgery and radiation therapy change. These  findings are stable.  2. Enlarged prostate.  3. Sclerotic bone metastases unchanged.  4. Atherosclerotic aorta without aneurysm    CT CAP 07/13/18:   IMPRESSION:   1. Increased size of the prostate and increased nodularity involving the bladder  base and bladder wall.  2. New left hydronephrosis and hydroureter with obstruction at the level of the  ureterovesical junction.  3. Status post right lung surgery with elevation of the right hemidiaphragm and  surgical staples and scarring in the right hilum consistent with postsurgical  and postradiation therapy effect, unchanged.  4. Sclerotic bone metastases to the spine unchanged.  5. New low-attenuation liver lesion could represent a metastasis.  6. Dilated fluid-filled esophagus suggesting esophageal dysmotility with  thickened wall possibly secondary to radiation therapy.  7. Atherosclerotic aorta with coronary artery calcifications. No abdominal  aortic aneurysm.    Pathology from liver biopsy 08/17/18:   Specimen Source   1: Liver, Core biopsy with touch intepretation:  CYTOLOGIC INTERPRETATION:   1. Liver, Core biopsy with touch intepretation:   Poorly differentiated adenocarcinoma consistent with metastatic pulmonary adenocarcinoma   See comment   General Categorization   Positive for malignancy.     ASSESSMENT  Bruce Clark is a 68 y.o. male with widely metastatic prostate adenocarcinoma and newly diagnosed lung adenocarcinoma status post right lower lobe lobectomy on 05/22/17 and presents today for follow-up.     PLAN    Prostate cancer  Castrate sensitive, treatment na??ve widely metastatic prostate cancer to lymph nodes above and below the diaphragm and bones.   Prostate adenocarcinoma diagnosed on biopsy of the supraclavicular lymph node. Discussed in tumor board and a small cell component has been ruled out.  PSA at the time of diagnosis at 2400.    Currently on Lupron 45 mg every 6 months. On Zytiga 1,059m daily + prednisone 5 mg daily per [place].   Lupron q6 months    PSA pending today however last imaging and clinical presentation is concerning for progression of prostate cancer as well as #2      R lung adenocarcinoma, T3N2MX stage IIIB- Parietal pleural involvement- PD-L1 99%  S/p RLL lobectomy on 10/5  Very high risk of recurrence  Case was discussed in tumor board and the consensus was to offer PORT with N2 disease  PORT with Dr. PLeron CroakCompleted  09/18/17.     Repeat imaging on 07/13/18 shows new liver lesion which has since been biopsied and positive for adenocarcinoma of lung.   He has been poorly compliant having imaging/biopsy scheduled as well as follow-up and he is likely rapidly progressing possible from both lung and prostate cancer.     He will need repeat imaging with CT CAP while inpatient. Also may need MRI due to hyponatremia.     Bone metastases  Diffuse  Completed palliative radiation that was initiated on 06/09/16  Hold off biphosphonates due to poor dental hygiene  Pt did not complete bone scan as requested    DVT  Diagnosed 04/22/16  Cancer associated  On lifelong Eliquis 512mBID   Will need venous duplex to rule out RLL DVT while hospitalized as well as CT to rule out PE due to SOB    RUQ pain  New liver mets as of 11/26  May be progressed since then, needs repeat imaging inpatient   Pain management     Anemia   Worsening, likely due to progressive cancer    Elevated Cr with left hydronephrosis   Nearly 9 today, sending pt to ER   Will need nephrology consult and urology eval for hydronephrosis     Hyponatremia  See above     Recommend ER and admission for uncontrolled pain and management of  hydronephrosis and repeat imaging of CAP due to likely rapidly progressing lung and prostate cancer

## 2018-08-26 NOTE — H&P (Signed)
Brief:    Pt admitted with    ?? Obst uropathy ( 800 cc's post void residual w foley left in place), urology to follow CT abd/pelvis  on order   ?? AKI - for nephrology input and avoiding nephrotoxic's and close following   ?? DVT - for heparin drip, failed Eliquis taken up to admission  ?? Anemia for cont monitoring and f/u hematopoetics   ?? Elevated BNP  ?? Prostate cancer on chemo followed by Dr Melony Overly   ?? H/o stage 3 lung cancer post resection and rxt followed by Dr. Melony Overly   ?? DNR status (confirmed with pt on admissions)   ?? Low wt status  ?? Ambulation difficulties          Full note to follow    Emilio Math, MD 08/26/2018

## 2018-08-26 NOTE — Consults (Addendum)
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 Dubin is a 68 y.o. male who is on Eliquis for a h/o DVT who presented to the Emergency Department on 04/22/16 with nausea, vomiting, and abdominal pain x 2 months, decreased appetite and weight loss of 10-14 lbs.  CTA revealed bilateral lung nodules and mediastinal/hilar adenopathy. PET revealed supraclavicular adenopathy, retroperitoneal adenopathy, bilateral iliac adenopathy, mediastinal/hilar adenopathy, bony metastatic disease, right lower lobe pulmonary mass, and multiple other smaller masses in bilateral lungs. USG guided bx of supraclavicular LN showed adenocarcinoma of the prostate. He was also diagnosed and treated for CAP. PSA on 05/26/16-2400. Was discussed in tumor board, Napsin A negative and a small cell component ruled out. Initiated Degarelix  05/26/16. MRI spine with expansile C7 lesion, T12 lesion encroaching epidural space, S1-S2 lesion encroaching neural foramina. Started palliative C6-T5 and T11-L5 XRT under Dr. Leron Croak on 06/09/16. Started Zytiga 08/28/16 with evidence of response. Noted to have a  Left lower lobe lung nodule on CT done 12/22/16. Biopsy consistent with Lung adenocarcinoma. He had a mediastinoscopy with LN being negative for metastatic disease. Robotic RLL lobectomy performed on 05/22/17. He underwent a robotic right lower lobectomy on 05/22/17 with Dr. Glo Herring. Pathology showed adenocarcinoma T3N2 stage IIIB. Case discussed at tumor board and there is no plan for adjuvant chemotherapy at this time. He completed post op radiation with Dr. Leron Croak on 09/18/17. He had scans in Nov 2019 that showed a new liver lesion and Left hydronephrosis. A liver biopsy and urology evaluation were recommended but patient did not follow  through. Ultimately had a liver biopsy in Dec 2019 which was consistent with liver adenocarcinoma. He did show up to our office today on 08/26/2018 with several complains    He has increased pain in his RUQ that radiates to epigastric area. Taking pecocet which is not helping. He has SOB with minimal exertion. Per friend [name] he can barely walk to the bathroom. His right leg is swollen from his foot all the way up to scrotum.  He continues to take Eliquis and has not missed any doses. He has a poor appetite and has lost weight. He denies diarrhea and has intermittent constipation. Continues to take Zytiga + Prednisone daily.  We obtained his labs that showed a creatinine of 8.7 and he was directed to the ER     Past Medical History:   Diagnosis Date   ??? Lung cancer ([place])    ??? Prostate cancer ([place])    ??? Stroke ([place])     PT STATES, "I HAD A STROKE A LONG TIME AGO"       Past Surgical History:   Procedure Laterality Date   ??? CHEST SURGERY PROCEDURE UNLISTED  04/27/2017    BRONCHOSCOPY/MEDIASTINOSCOPY   ??? HX GI      HERNIA REPAIR   ??? HX ORTHOPAEDIC  1994    ROD IN RIGHT LEG       No Known Allergies    Current Outpatient Medications   Medication Sig Dispense Refill   ??? apixaban (ELIQUIS) 5 mg tablet Take 1 Tab by mouth two (2) times a day. 60 Tab 12   ??? magnesium hydroxide (PHILLIPS MILK OF MAGNESIA) 400 mg/5 mL suspension Take 30 mL by mouth daily as needed for Constipation.     ??? predniSONE (DELTASONE) 5  mg tablet Take 1 Tab by mouth daily. Restart on Jun 01, 2017 30 Tab 6   ??? abiraterone (ZYTIGA) 500 mg tab Take 1,000 mg by mouth daily. 60 Tab 3   ??? polyethylene glycol (MIRALAX) 17 gram packet Take 1 Packet by mouth daily. 30 Packet 2   ??? magic mouthwash solution Magic mouth wash   Maalox  Lidocaine 2% viscous   Diphenhydramine oral solution   Nystatin 100,000 units    Pharmacy to mix equal portions of ingredients to a total volume as indicated in the dispense amount.   Swish and spit 45m (1 tsp) four times daily as needed for mouth sores 480 mL 3   ??? senna (SENOKOT) 8.6 mg tablet Take 1 Tab by mouth nightly. Indications: constipation 30 Tab 0       Social History     Socioeconomic History   ??? Marital status: SINGLE     Spouse name: Not on file   ??? Number of children: Not on file   ??? Years of education: Not on file   ??? Highest education level: Not on file   Tobacco Use   ??? Smoking status: Former Smoker     Packs/day: 0.50     Years: 47.00     Pack years: 23.50     Last attempt to quit: 06/2016     Years since quitting: 2.1   ??? Smokeless tobacco: Never Used   Substance and Sexual Activity   ??? Alcohol use: No   ??? Drug use: No       Family History   Problem Relation Age of Onset   ??? Cancer Mother         COLON   ??? Cancer Father         BRAIN TUMOR   ??? No Known Problems Sister    ??? Arthritis-osteo Brother    ??? No Known Problems Sister    ??? No Known Problems Sister    ??? No Known Problems Sister    ??? No Known Problems Brother    ??? Anesth Problems Neg Hx      ROS  A review of systems was obtained and is negative except as listed in HPI.  ECOG PS is 1    Physical Examination:   Visit Vitals  BP 140/79   Pulse 86   Temp 98.1 ??F (36.7 ??C)   Resp 22   SpO2 100%     General appearance - alert, In pain, appears fatigued and ill  Mental status - oriented to person, place, and time  Mouth - mucous membranes moist, no lesions noted  Neck - supple, no significant adenopathy  Resp-CTA, RLL diminished,normal respiratory effort  ABD- soft, TTP over RUQ  Neurological - normal speech, no focal findings or movement disorder noted  Musculoskeletal - no joint tenderness, deformity or swelling  Extremities - peripheral pulses normal, 3+ right LE edema up to scrotum, 2+ LLE edema, no clubbing or cyanosis  Skin - robotic incision site and previous chest tube site on right side of chest appear to be healing appropriately    LABS  Lab Results   Component Value Date/Time    WBC 5.5 08/26/2018 02:25 PM     HGB 7.3 (L) 08/26/2018 02:25 PM    HCT 22.7 (L) 08/26/2018 02:25 PM    PLATELET 219 08/26/2018 02:25 PM    MCV 85.7 08/26/2018 02:25 PM    ABS. NEUTROPHILS 3.9 08/26/2018 02:25 PM     Lab  Results   Component Value Date/Time    Sodium 133 (L) 08/26/2018 02:25 PM    Potassium 3.9 08/26/2018 02:25 PM    Chloride 96 (L) 08/26/2018 02:25 PM    CO2 28 08/26/2018 02:25 PM    Glucose 94 08/26/2018 02:25 PM    BUN 79 (H) 08/26/2018 02:25 PM    Creatinine 8.52 (H) 08/26/2018 02:25 PM    GFR est AA 8 (L) 08/26/2018 02:25 PM    GFR est non-AA 6 (L) 08/26/2018 02:25 PM    Calcium 8.7 08/26/2018 02:25 PM     Lab Results   Component Value Date/Time    AST (SGOT) 26 08/26/2018 11:41 AM    Alk. phosphatase 88 08/26/2018 11:41 AM    Protein, total 8.2 08/26/2018 11:41 AM    Albumin 2.8 (L) 08/26/2018 11:41 AM    Globulin 5.4 (H) 08/26/2018 11:41 AM    A-G Ratio 0.5 (L) 08/26/2018 11:41 AM     Recent Labs     08/26/18  1141 07/05/18  0850 05/11/18  0959 03/24/18  0949 02/17/18  0851 01/01/18  0811 11/27/17  0806   PSA 0.1 0.0*  --  0.1 0.1 0.1 0.1   PSALT  --   --  <0.1  --   --   --   --        IMAGING  PET CT  03/23/17  IMPRESSION: Mass lesion right lower lobe is hypermetabolic and compatible with  the biopsy confirmed the result of adenocarcinoma. There are soft tissue  densities in the anterior abdominal wall bilaterally which are stable compared  to the prior chest abdomen pelvis CT but new compared to the prior PET/CT.  Please correlate with any recent abdominal wall surgery as these may represent  port sites. Otherwise normal tracer distribution. Stable sclerotic osseous  metastatic disease without abnormal activity.    PATHOLOGY  Supraclavicular node   CYTOLOGIC INTERPRETATION:   Adenocarcinoma, consistent with prostate primary   See comment   General Categorization   Positive for malignancy.   Specimen Adequacy   Satisfactory for evaluation.   Comment   Touch preps and a core biopsy are examined and show islands of  adenocarcinoma surrounded by fibrous stroma. A panel of immunohistochemical stains was performed to evaluate site of origin. The tumor cells are focally positive for PSA and PSAP and negative for CK7, CK20, TTF-1    CT CAP 03/22/18  IMPRESSION  IMPRESSION:   1. Status post right lung surgery with right perihilar consolidation and  scarring consistent with post surgery and radiation therapy change. These  findings are stable.  2. Enlarged prostate.  3. Sclerotic bone metastases unchanged.  4. Atherosclerotic aorta without aneurysm    CT CAP 07/13/18:   IMPRESSION:   1. Increased size of the prostate and increased nodularity involving the bladder  base and bladder wall.  2. New left hydronephrosis and hydroureter with obstruction at the level of the  ureterovesical junction.  3. Status post right lung surgery with elevation of the right hemidiaphragm and  surgical staples and scarring in the right hilum consistent with postsurgical  and postradiation therapy effect, unchanged.  4. Sclerotic bone metastases to the spine unchanged.  5. New low-attenuation liver lesion could represent a metastasis.  6. Dilated fluid-filled esophagus suggesting esophageal dysmotility with  thickened wall possibly secondary to radiation therapy.  7. Atherosclerotic aorta with coronary artery calcifications. No abdominal  aortic aneurysm.  ??  Pathology from liver biopsy 08/17/18:  Specimen Source   1: Liver, Core biopsy with touch intepretation:   CYTOLOGIC INTERPRETATION:   1. Liver, Core biopsy with touch intepretation:   Poorly differentiated adenocarcinoma consistent with metastatic pulmonary adenocarcinoma   See comment   General Categorization   Positive for malignancy.       ASSESSMENT  Mr. Corsino is a 68 y.o. male with widely metastatic prostate adenocarcinoma and lung adenocarcinoma status post right lower lobe lobectomy on 05/22/17. Most recent imaging concerning for progression with  new liver mets. He is admitted for acute renal failure    PLAN    Acute renal failure  Suspect this is post obstructive  His CT in Nov 2019 showed Left hydronephrosis due to obstruction at the UVJ likely from progressive prostate metastasis involving the bladder  Unfortunately patient was non compliant and did not follow up with oncology or urology  I agree with a CT CAP WO contrast and a urology consult  It is likely that he may end up need PCN     Lower extremity edema and h/o DVT  Diagnosed 04/22/16  Cancer associated  On lifelong Eliquis 85m BID   Now with marked RLE  Venous duplex obtained in ER with results pending   If he has an acute DVT start Heparin gtt   Hold Eliquis given his ARF      Prostate cancer stage IV  Castrate sensitive, treatment na??ve widely metastatic prostate cancer to lymph nodes above and below the diaphragm and bones.  Prostate adenocarcinoma diagnosed on biopsy of the supraclavicular lymph node. Discussed in tumor board and a small cell component has been ruled out.  PSA at the time of diagnosis at 2400.  ??  Currently on Lupron 45 mg every 6 months. On Zytiga 1,0061mdaily + prednisone 5 mg daily which has controlled his disease now for > 2 years  Though his PSA is still undetectable at 0.1 his prior scans especially the bladder masses suggest progressive prostate cancer and probable castrate resistance  We will hold Zytiga inpatient and discuss options after repeat CT scans  ??  R lung adenocarcinoma, T3N2MX stage IIIB- Parietal pleural involvement- PD-L1 99%  S/p RLL lobectomy on 05/22/17 then received PORT  Now with biopsy proven liver metastases  Repeat CT CAP pending  Plan on palliative Keytruda OP      Bone metastases  Diffuse  Completed palliative radiation that was initiated on 06/09/16      Right sided chest wall pain  Unclear cause  No corresponding mets- CT chest awaited  Consult palliative care    Anemia   Worsening, likely due to progressive cancer, renal failure   Hold eliquis and transfuse if < 7 g/dl      Will continue to follow     Seen in conjunction with [facility]      RaJuliene PinaD, MSPine [facility]  '

## 2018-08-26 NOTE — Progress Notes (Signed)
Admission Medication Reconciliation:    Information obtained from:  Patient's prescription bottles  RxQuery data available??:  YES    Comments/Recommendations: Updated PTA meds/reviewed patient's allergies.    1)  Medication reconciliation interview completed with the patient and his medical power of attorney.  Patient's current prescription bottles were provided.  The patient took his zytiga and apixaban today but did not take his prednisone today due to not eating.  He reports not needed in the percocet in several days.    2)  Medication changes (since last review):  Added  - Percocet 5/325mg  by mouth every 4 hours as needed for pain.     Adjusted  - none    Removed  - magic mouthwash, milk of magnesia, miralax, and senna (not being utilized currently).     3)  Thank you for allowing me to participate in the care of this patient. If there are any further questions, please contact the pharmacy at [phone] or the medication reconciliation pharmacist at [phone].    Wynona Meals, Pharm.D., BCPS       ??RxQuery pharmacy benefit data reflects medications filled and processed through the patient's insurance, however   this data does NOT capture whether the medication was picked up or is currently being taken by the patient.    Allergies:  Patient has no known allergies.    Significant PMH/Disease States:   Past Medical History:   Diagnosis Date   ??? Lung cancer ([place])    ??? Prostate cancer ([place])    ??? Stroke ([place])     PT STATES, "I HAD A STROKE A LONG TIME AGO"     Chief Complaint for this Admission:    Chief Complaint   Patient presents with   ??? Referral / Consult     Prior to Admission Medications:   Prior to Admission Medications   Prescriptions Last Dose Informant Taking?   abiraterone (ZYTIGA) 500 mg tab 08/26/2018 at Unknown time  Yes   Sig: Take 1,000 mg by mouth daily.   apixaban (ELIQUIS) 5 mg tablet 08/26/2018 at Unknown time  Yes   Sig: Take 1 Tab by mouth two (2) times a day.    oxyCODONE-acetaminophen (PERCOCET) 5-325 mg per tablet 08/23/2018  Yes   Sig: Take 1 Tab by mouth every four (4) hours as needed for Pain.   predniSONE (DELTASONE) 5 mg tablet 08/25/2018 at Unknown time  Yes   Sig: Take 1 Tab by mouth daily. Restart on Jun 01, 2017      Facility-Administered Medications: None       Please contact the main inpatient pharmacy with any questions or concerns at [phone] and we will direct you to the clinical pharmacist covering this patient's care while in-house.   Thurnell Garbe, PHARMD

## 2018-08-26 NOTE — ED Notes (Signed)
[name] at bedside.

## 2018-08-26 NOTE — ED Notes (Signed)
Vascular called. +DVT in femoral, common femoral and popliteal. Notified ER provider

## 2018-08-26 NOTE — Other (Signed)
TRANSFER - OUT REPORT:    Verbal report given to Fulda RN(name) on [employer]  being transferred to 4W(unit) for routine progression of care       Report consisted of patient???s Situation, Background, Assessment and   Recommendations(SBAR).     Information from the following report(s) SBAR, ED Summary, Procedure Summary, MAR and Recent Results was reviewed with the receiving nurse.    Lines:   Peripheral IV 08/26/18 Left Antecubital (Active)   Site Assessment Clean, dry, & intact 08/26/2018  3:59 PM   Phlebitis Assessment 0 08/26/2018  3:59 PM   Infiltration Assessment 0 08/26/2018  3:59 PM   Dressing Status Clean, dry, & intact 08/26/2018  3:59 PM   Hub Color/Line Status Green 08/26/2018  3:59 PM        Opportunity for questions and clarification was provided.

## 2018-08-26 NOTE — Progress Notes (Signed)
Bruce Clark is a 68 y.o. male   Chief Complaint   Patient presents with   ??? Follow-up   ??? Prostate Cancer       1. Have you been to the ER, urgent care clinic since your last visit?  Hospitalized since your last visit? No  2. Have you seen or consulted any other health care providers outside of the [facility] since your last visit?  Include any pap smears or colon screening. No

## 2018-08-26 NOTE — ED Triage Notes (Signed)
Patient presents from oncology appointment with complaints of right leg swelling, weakness, decreased appetite, and insomnia.  Patient is being treated by Dr. Elesa Massed for lung cancer and was told in has spread to his liver. Patient reports this all started this week. Patient he had one episode this week of passing blood in his urine, but has not since

## 2018-08-26 NOTE — Progress Notes (Deleted)
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 male who is on Eliquis for a h/o DVT who presented to the Emergency Department on 04/22/16 with nausea, vomiting, and abdominal pain x 2 months, decreased appetite and weight loss of 10-14 lbs.  CTA revealed bilateral lung nodules and mediastinal/hilar adenopathy. PET revealed supraclavicular adenopathy, retroperitoneal adenopathy, bilateral iliac adenopathy, mediastinal/hilar adenopathy, bony metastatic disease, right lower lobe pulmonary mass, and multiple other smaller masses in bilateral lungs. USG guided bx of supraclavicular LN showed adenocarcinoma of the prostate. He was also diagnosed and treated for CAP. PSA on 05/26/16-2400. Was discussed in tumor board, Napsin A negative and a small cell component ruled out. Initiated Degarelix  05/26/16. MRI spine with expansile C7 lesion, T12 lesion encroaching epidural space, S1-S2 lesion encroaching neural foramina. Started palliative C6-T5 and T11-L5 XRT under Dr. Leron Croak on 06/09/16. Started Zytiga 08/28/16 with evidence of response. Noted to have a  Left lower lobe lung nodule on CT done 12/22/16. Biopsy consistent with Lung adenocarcinoma. He had a mediastinoscopy with LN being negative for metastatic disease. Robotic RLL lobectomy performed on 05/22/17.     He underwent a robotic right lower lobectomy on 05/22/17 with Dr. Glo Herring. Pathology showed adenocarcinoma T3N2 stage IIIB. Case discussed at tumor board and there is no plan for adjuvant chemotherapy at this time. He completed post op radiation with Dr. Leron Croak on 09/18/17.     He comes after having had scans on 07/13/18. Multiple attempts to reach him have been unsuccessful. He also had a liver biopsy in Dec 2019 after scans showed a liver metastasis.     Comes in today for follow-up. He has not been feeling well lately. He has increased pain over his right chest area over previous surgical site as well as right hip pain. He received a cortisone injection in his right hip on Monday but this has not helped. Cough is at baseline. Reports SOB with moderate exertion which is stable. He has been less active lately due to pain. No fevers/chills. He reports his right sided chest pain comes and goes. Denies nausea/vomiting. Denies diarrhea/constipation. No mouth sores. Continues to take Zytiga + Prednisone daily. Denies bleeding on Eliquis.     Otherwise, complete ROS is per the symptom report form which has been scanned into the media section of the electronic medical record.    Past Medical History:   Diagnosis Date   ??? Lung cancer ([place])    ??? Prostate cancer ([place])    ??? Stroke ([place])     PT STATES, "I HAD A STROKE A LONG TIME AGO"       Past Surgical History:   Procedure Laterality Date   ??? CHEST SURGERY PROCEDURE UNLISTED  04/27/2017    BRONCHOSCOPY/MEDIASTINOSCOPY   ??? HX GI      HERNIA REPAIR   ??? HX ORTHOPAEDIC  1994    ROD IN RIGHT LEG       No Known Allergies    Current Outpatient Medications   Medication Sig Dispense Refill   ??? apixaban (ELIQUIS) 5 mg tablet Take 1 Tab by mouth two (2) times a day. 60 Tab 12   ??? magnesium hydroxide (PHILLIPS MILK OF MAGNESIA) 400 mg/5 mL suspension Take 30 mL by mouth daily as needed for Constipation.     ???  predniSONE (DELTASONE) 5 mg tablet Take 1 Tab by mouth daily. Restart on Jun 01, 2017 30 Tab 6   ??? abiraterone (ZYTIGA) 500 mg tab Take 1,000 mg by mouth daily. 60 Tab 3   ??? polyethylene glycol (MIRALAX) 17 gram packet Take 1 Packet by mouth daily. 30 Packet 2   ??? magic mouthwash solution Magic mouth wash   Maalox  Lidocaine 2% viscous   Diphenhydramine oral solution   Nystatin 100,000 units    Pharmacy to mix equal portions of ingredients to a total volume as indicated in the dispense amount.   Swish and spit 58m (1 tsp) four times daily as needed for mouth sores 480 mL 3   ??? senna (SENOKOT) 8.6 mg tablet Take 1 Tab by mouth nightly. Indications: constipation 30 Tab 0       Social History     Socioeconomic History   ??? Marital status: SINGLE     Spouse name: Not on file   ??? Number of children: Not on file   ??? Years of education: Not on file   ??? Highest education level: Not on file   Tobacco Use   ??? Smoking status: Former Smoker     Packs/day: 0.50     Years: 47.00     Pack years: 23.50     Last attempt to quit: 06/2016     Years since quitting: 2.1   ??? Smokeless tobacco: Never Used   Substance and Sexual Activity   ??? Alcohol use: No   ??? Drug use: No       Family History   Problem Relation Age of Onset   ??? Cancer Mother         COLON   ??? Cancer Father         BRAIN TUMOR   ??? No Known Problems Sister    ??? Arthritis-osteo Brother    ??? No Known Problems Sister    ??? No Known Problems Sister    ??? No Known Problems Sister    ??? No Known Problems Brother    ??? Anesth Problems Neg Hx      ROS  A review of systems was obtained and is negative except as listed in HPI.  ECOG PS is 1    Physical Examination:   Visit Vitals  BP 130/90 (BP 1 Location: Left arm)   Pulse 87   Temp 97.4 ??F (36.3 ??C)   Ht 5' 7"  (1.702 m)   Wt 145 lb 9.6 oz (66 kg)   SpO2 96%   BMI 22.80 kg/m??     General appearance - alert, and in no distress, appears fatigued  Mental status - oriented to person, place, and time  Mouth - mucous membranes moist, no lesions noted  Neck - supple, no significant adenopathy  Resp-CTA, RLL diminished, diminished, normal respiratory effort  ABD- soft, non tender, active bowel sounds  Neurological - normal speech, no focal findings or movement disorder noted  Musculoskeletal - no joint tenderness, deformity or swelling  Extremities - peripheral pulses normal, no pedal edema, no clubbing or cyanosis  Skin - robotic incision site and previous chest tube site on right side of chest appear to be healing appropriately     LABS  Lab Results   Component Value Date/Time    WBC 4.8 07/05/2018 08:50 AM    HGB 10.6 (L) 07/05/2018 08:50 AM    HCT 32.2 (L) 07/05/2018 08:50 AM    PLATELET 185 07/05/2018 08:50 AM  MCV 86.6 07/05/2018 08:50 AM    ABS. NEUTROPHILS 3.2 07/05/2018 08:50 AM     Lab Results   Component Value Date/Time    Sodium 140 07/05/2018 08:50 AM    Potassium 3.3 (L) 07/05/2018 08:50 AM    Chloride 105 07/05/2018 08:50 AM    CO2 28 07/05/2018 08:50 AM    Glucose 82 07/05/2018 08:50 AM    BUN 8 07/05/2018 08:50 AM    Creatinine 0.74 07/05/2018 08:50 AM    GFR est AA >60 07/05/2018 08:50 AM    GFR est non-AA >60 07/05/2018 08:50 AM    Calcium 9.0 07/05/2018 08:50 AM     Lab Results   Component Value Date/Time    AST (SGOT) 20 07/05/2018 08:50 AM    Alk. phosphatase 104 07/05/2018 08:50 AM    Protein, total 7.1 07/05/2018 08:50 AM    Albumin 3.3 (L) 07/05/2018 08:50 AM    Globulin 3.8 07/05/2018 08:50 AM    A-G Ratio 0.9 (L) 07/05/2018 08:50 AM     Recent Labs     07/05/18  0850 05/11/18  0959 03/24/18  0949 02/17/18  0851 01/01/18  0811 11/27/17  0806   PSA 0.0*  --  0.1 0.1 0.1 0.1   PSALT  --  <0.1  --   --   --   --        IMAGING  PET CT  03/23/17  IMPRESSION: Mass lesion right lower lobe is hypermetabolic and compatible with  the biopsy confirmed the result of adenocarcinoma. There are soft tissue  densities in the anterior abdominal wall bilaterally which are stable compared  to the prior chest abdomen pelvis CT but new compared to the prior PET/CT.  Please correlate with any recent abdominal wall surgery as these may represent  port sites. Otherwise normal tracer distribution. Stable sclerotic osseous  metastatic disease without abnormal activity.    PATHOLOGY  Supraclavicular node   CYTOLOGIC INTERPRETATION:   Adenocarcinoma, consistent with prostate primary   See comment   General Categorization   Positive for malignancy.   Specimen Adequacy   Satisfactory for evaluation.   Comment    Touch preps and a core biopsy are examined and show islands of adenocarcinoma surrounded by fibrous stroma. A panel of immunohistochemical stains was performed to evaluate site of origin. The tumor cells are focally positive for PSA and PSAP and negative for CK7, CK20, TTF-1    Liver biopsy 07/2018     Specimen Source   1: Liver, Core biopsy with touch intepretation:   CYTOLOGIC INTERPRETATION:   1. Liver, Core biopsy with touch intepretation:   Poorly differentiated adenocarcinoma consistent with metastatic pulmonary adenocarcinoma   See comment     CT CAP 03/22/18  IMPRESSION  IMPRESSION:   1. Status post right lung surgery with right perihilar consolidation and  scarring consistent with post surgery and radiation therapy change. These  findings are stable.  2. Enlarged prostate.  3. Sclerotic bone metastases unchanged.  4. Atherosclerotic aorta without aneurysm    CT CAP 07/13/18    IMPRESSION  IMPRESSION:   1. Increased size of the prostate and increased nodularity involving the bladder  base and bladder wall.  2. New left hydronephrosis and hydroureter with obstruction at the level of the  ureterovesical junction.  3. Status post right lung surgery with elevation of the right hemidiaphragm and  surgical staples and scarring in the right hilum consistent with postsurgical  and postradiation therapy effect, unchanged.  4. Sclerotic bone metastases to the spine unchanged.  5. New low-attenuation liver lesion could represent a metastasis.  6. Dilated fluid-filled esophagus suggesting esophageal dysmotility with  thickened wall possibly secondary to radiation therapy.  7. Atherosclerotic aorta with coronary artery calcifications. No abdominal  aortic aneurysm.  ??  His prostate is larger and there are some nodules inside his bladder. One of these nodules are blocking urine from from the ureter       ASSESSMENT  Bruce Clark is a 68 y.o. male with widely metastatic prostate  adenocarcinoma and newly diagnosed lung adenocarcinoma status post right lower lobe lobectomy on 05/22/17 and presents today for follow-up.     PLAN    Prostate cancer  Castrate sensitive, treatment na??ve widely metastatic prostate cancer to lymph nodes above and below the diaphragm and bones.  Prostate adenocarcinoma diagnosed on biopsy of the supraclavicular lymph node. Discussed in tumor board and a small cell component has been ruled out.  PSA at the time of diagnosis at 2400.    Currently on Lupron 45 mg every 6 months. On Zytiga 1,043m daily + prednisone 5 mg daily per [place].   He has continued to have a radiologic and PSA response with CT 8/5 Bone scan remain stable.  CBC, CMP, PSA monthly in [facility]   Lupron q6 months, received this month      R lung adenocarcinoma, T3N2MX stage IIIB- Parietal pleural involvement- PD-L1 99%  S/p RLL lobectomy on 10/5  Very high risk of recurrence  Case was discussed in tumor board and the consensus was to offer PORT with N2 disease  PORT with Dr. PLeron CroakCompleted  09/18/17.     As regards adjuvant chemotherapy- we elected to not pursue this with another life limiting illness. We decided to consider immunotherapy with any evidence of recurrence.    Repeat imaging shows no recurrence AS OF 03/22/18. Repeat imaging due November 2019 and will assist in getting this scheduled ASAP as he is not been feeling well.     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 Will follow     Labs monthly in [facility]   CT/bone scan scheduled for pt on 07/13/18, will call with results and if progressive disease will have him return to clinic sooner  If not, RTC in 2 months      RaJuliene PinaD, MSGeorge Masonncology associates

## 2018-08-26 NOTE — Progress Notes (Signed)
[facility] Lab Visit:            1200  Pt arrived ambulatory to [facility] in stable condition, for lab draw. Labs drawn peripherally from  Left AC arm and sent for processing.        Visit Vitals  BP 147/82 (BP 1 Location: Left arm, BP Patient Position: Sitting)   Pulse 81   Temp 98.2 ??F (36.8 ??C)   Resp 18   SpO2 100%     1215  Tolerated well. D/c home ambulatory in no distress. Pt aware of next [facility] appointment scheduled.      Labs available in CC once resulted.

## 2018-08-26 NOTE — Progress Notes (Signed)
 [facility] Lab Visit:            1200  Pt arrived ambulatory to [facility] in stable condition, for lab draw. Labs drawn peripherally from  Left AC arm and sent for processing.        Visit Vitals  BP 147/82 (BP 1 Location: Left arm, BP Patient Position: Sitting)   Pulse 81   Temp 98.2 F (36.8 C)   Resp 18   SpO2 100%     1215  Tolerated well. D/c home ambulatory in no distress. Pt aware of next [facility] appointment scheduled.      Labs available in CC once resulted.

## 2018-08-26 NOTE — ED Notes (Signed)
Patient presents from oncology appointment with complaints of right leg swelling, weakness, decreased appetite, and insomnia.  Patient is being treated by Dr. Elesa Massed for lung cancer and was told in has spread to his liver. Patient reports this all started this week. Patient he had one episode this week of passing blood in his urine, but has not since

## 2018-08-26 NOTE — Progress Notes (Signed)
 Patient in Vascular.

## 2018-08-26 NOTE — Progress Notes (Signed)
 Bruce Clark is a 68 y.o. male  Chief Complaint   Patient presents with   . Follow-up   . Prostate Cancer   1. Have you been to the ER, urgent care clinic since your last visit?  Hospitalized since your last visit? No  2. Have you seen or consulted any other health care providers outside of the [facility] since your last visit?  Include any pap smears or colon screening. No

## 2018-08-26 NOTE — ED Provider Notes (Signed)
68 y.o. male with past medical history significant for lung cancer, prostate cancer, and stroke who presents via POV from his oncology office with chief complaint of shortness of breath. Pt was seen by his oncologist this morning and advised to come to ED due to R lower extremity swelling, and increased shortness of breath that is worse with exertion. Pt was seeing Bruce Clark today for relapse of metastatic lung cancer that was discovered to have spread to his liver, after a liver biopsy on 08/17/18. Pt was c/o persistent RUQ pain, which triggered the biopsy. Pt now also c/o decreased appetite, difficulty sleeping, difficulty lying flat, hematuria, constipation (LBM 3 days ago), difficulty urinating, and scrotal swelling. Pt is anti-coagulated on Eliquis. He has hx of R lower lobectomy in 2018 with Bruce Clark. Pt does not have a port present at this time. He denies hx of CHF. There are no other acute medical concerns at this time.  Social hx: former tobacco smoker (quit 2017); denies EtOH use; denies illicit drug use.    PCP: Bruce Pina, MD      Note written by Bruce Clark, Scribe, as dictated by Bruce Dikes, MD 2:22 PM      The history is provided by the patient and the spouse. No language interpreter was used.        Past Medical History:   Diagnosis Date   ??? Lung cancer ([place])    ??? Prostate cancer ([place])    ??? Stroke ([place])     PT STATES, "I HAD A STROKE A LONG TIME AGO"       Past Surgical History:   Procedure Laterality Date   ??? CHEST SURGERY PROCEDURE UNLISTED  04/27/2017    BRONCHOSCOPY/MEDIASTINOSCOPY   ??? HX GI      HERNIA REPAIR   ??? HX ORTHOPAEDIC  1994    ROD IN RIGHT LEG         Family History:   Problem Relation Age of Onset   ??? Cancer Mother         COLON   ??? Cancer Father         BRAIN TUMOR   ??? No Known Problems Sister    ??? Arthritis-osteo Brother    ??? No Known Problems Sister    ??? No Known Problems Sister    ??? No Known Problems Sister    ??? No Known Problems Brother    ??? Anesth Problems Neg  Hx        Social History     Socioeconomic History   ??? Marital status: SINGLE     Spouse name: Not on file   ??? Number of children: Not on file   ??? Years of education: Not on file   ??? Highest education level: Not on file   Occupational History   ??? Not on file   Social Needs   ??? Financial resource strain: Not on file   ??? Food insecurity:     Worry: Not on file     Inability: Not on file   ??? Transportation needs:     Medical: Not on file     Non-medical: Not on file   Tobacco Use   ??? Smoking status: Former Smoker     Packs/day: 0.50     Years: 47.00     Pack years: 23.50     Last attempt to quit: 06/2016     Years since quitting: 2.1   ??? Smokeless tobacco: Never Used  Substance and Sexual Activity   ??? Alcohol use: No   ??? Drug use: No   ??? Sexual activity: Not on file   Lifestyle   ??? Physical activity:     Days per week: Not on file     Minutes per session: Not on file   ??? Stress: Not on file   Relationships   ??? Social connections:     Talks on phone: Not on file     Gets together: Not on file     Attends religious service: Not on file     Active member of club or organization: Not on file     Attends meetings of clubs or organizations: Not on file     Relationship status: Not on file   ??? Intimate partner violence:     Fear of current or ex partner: Not on file     Emotionally abused: Not on file     Physically abused: Not on file     Forced sexual activity: Not on file   Other Topics Concern   ??? Not on file   Social History Narrative   ??? Not on file         ALLERGIES: Patient has no known allergies.    Review of Systems   Constitutional: Positive for appetite change. Negative for fever.   Respiratory: Negative for shortness of breath.    Cardiovascular: Positive for leg swelling.   Gastrointestinal: Positive for abdominal pain and constipation. Negative for blood in stool.   Genitourinary: Positive for difficulty urinating, hematuria and scrotal swelling.   Psychiatric/Behavioral: Positive for sleep disturbance.   All  other systems reviewed and are negative.      Vitals:    08/26/18 1305   BP: 128/84   Pulse: 82   Resp: 20   Temp: 98.1 ??F (36.7 ??C)   SpO2: 100%            Physical Exam  Vitals signs and nursing note reviewed.   Constitutional:       Appearance: He is well-developed.      Comments: Cachectic   HENT:      Head: Normocephalic and atraumatic.   Eyes:      Conjunctiva/sclera: Conjunctivae normal.   Neck:      Musculoskeletal: Normal range of motion.   Cardiovascular:      Rate and Rhythm: Normal rate and regular rhythm.      Pulses: Normal pulses.      Heart sounds: Normal heart sounds. No murmur.   Pulmonary:      Effort: Pulmonary effort is normal. No respiratory distress.      Breath sounds: Normal breath sounds. No stridor. No wheezing, rhonchi or rales.   Chest:      Chest wall: No tenderness.   Abdominal:      General: Bowel sounds are normal. There is no distension.      Palpations: Abdomen is soft. There is no mass.      Tenderness: There is no tenderness. There is no guarding or rebound.      Hernia: No hernia is present.   Musculoskeletal: Normal range of motion.         General: Swelling present.      Comments: Right lower extremity 3+ edema up to the level of the scrotum   Skin:     General: Skin is warm and dry.   Neurological:      Mental Status: He is alert and oriented to  person, place, and time.      Cranial Nerves: No cranial nerve deficit.      Sensory: No sensory deficit.      Motor: No weakness.      Coordination: Coordination normal.          MDM  Number of Diagnoses or Management Options  Acute deep vein thrombosis (DVT) of femoral vein of right lower extremity ([place]):   AKI (acute kidney injury) [facility]):   Lower extremity edema:   Urinary retention:   Diagnosis management comments: Turn for PE and DVT by oncologist.  However patient is complaining of right-sided pain I will get a CT scan to evaluate for complication from patient's biopsy done on 12/31.  We will also get a Doppler to rule out clot  in patient's right leg which would be unlikely as he is on Eliquis.  It appears his creatinine is elevated from his oncologist office today.  Given history of enlarged prostate I will place a Foley as I suspect he has urinary retention.  He will need admission.       Amount and/or Complexity of Data Reviewed  Clinical lab tests: ordered and reviewed  Tests in the radiology section of CPT??: ordered  Obtain history from someone other than the patient: yes (Significant other)  Review and summarize past medical records: yes  Discuss the patient with other providers: yes (Hospitalist)    Critical Care  Total time providing critical care: 30-74 minutes (Total critical care time spent exclusive of procedures: 35)    Patient Progress  Patient progress: stable         Procedures    ED EKG interpretation:  Rhythm: normal sinus rhythm; and regular . Rate (approx.): 75; Axis: normal; P wave: normal; QRS interval: normal ; ST/T wave: T wave inverted; inverted in v3  EKG documented by Bruce Dikes, MD, scribe, as interpreted by Bruce Dikes, MD, ED MD.    [place] for Admission  3:41 PM    ED Room Number: ER21/21  Patient Name and age:  Bruce Clark 68 y.o.  male  Working Diagnosis:   1. AKI (acute kidney injury) ([place])    2. Lower extremity edema      Readmission: no  Isolation Requirements:  no  Recommended Level of Care:  telemetry  Code Status:  Full Code  Department:[facility] Adult ED - [phone]  Other: Patient with history of prostate cancer and lung cancer seen by his oncologist today and referred here for increasing shortness of breath and right lower extremity edema.  Concern was for PE however labs from the office show acute kidney injury.  Patient did have urinary retention and Foley was placed.  His creatinine today is 8.7.  It was normal prior.  He will not be able to have a PE CT so I did order Doppler of his leg.  It was just completed but has not been read.  His sats are fine he is in no  distress.  I ordered noncontrast imaging as he had a liver biopsy and was complaining of right-sided rib/lung pain to ensure he has no complication however his belly is soft.  The biopsy was done on 1231.     3:55 PM  Spoke with Bruce. Nancy Fetter, hospitalist via [facility] text for admission.

## 2018-08-26 NOTE — Consults (Signed)
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 male who is  on Eliquis for a h/o DVT who presented to the Emergency Department on 04/22/16 with nausea, vomiting, and abdominal pain x 2 months, decreased appetite and weight loss of 10-14 lbs.   CTA revealed bilateral lung nodules and mediastinal/hilar adenopathy. PET revealed supraclavicular adenopathy, retroperitoneal adenopathy,  bilateral iliac adenopathy, mediastinal/hilar adenopathy, bony metastatic disease, right lower lobe pulmonary mass, and multiple other smaller masses in bilateral lungs. USG guided bx of supraclavicular  LN showed adenocarcinoma of the prostate. He was also diagnosed and treated for CAP. PSA on 05/26/16-2400. Was discussed in tumor board, Napsin A negative and a small cell component ruled out. Initiated Degarelix  05/26/16. MRI spine with expansile C7 lesion,  T12 lesion encroaching epidural space, S1-S2 lesion encroaching neural foramina. Started palliative C6-T5 and T11-L5 XRT under Dr. Leron Croak on 06/09/16. Started Zytiga 08/28/16 with evidence of response. Noted to have a  Left lower lobe lung nodule on  CT done 12/22/16. Biopsy consistent with Lung adenocarcinoma. He had a mediastinoscopy with LN being negative for metastatic disease. Robotic RLL lobectomy performed on 05/22/17. He underwent a robotic right lower lobectomy on 05/22/17 with Dr. Glo Herring.  Pathology  showed adenocarcinoma T3N2 stage IIIB. Case discussed at tumor board and there is no plan for adjuvant chemotherapy at this time. He completed post op radiation with Dr. Leron Croak on 09/18/17. He had scans in Nov 2019 that showed a new liver  lesion and Left hydronephrosis. A liver biopsy and urology evaluation were recommended but patient did not follow through. Ultimately had a liver biopsy in Dec 2019 which was consistent with liver adenocarcinoma. He did show up to our office today on  08/26/2018 with several complains      He has increased pain in his RUQ that radiates to epigastric area. Taking pecocet which is not helping. He has SOB with minimal exertion. Per friend Bruce Clark he can barely walk  to the bathroom. His right leg is swollen from his foot all the way up to scrotum.  He continues to take Eliquis and has not missed any doses. He has a poor appetite and has lost weight. He denies diarrhea and has intermittent constipation. Continues  to take Zytiga + Prednisone daily.   We obtained his labs that showed a creatinine of 8.7 and he was directed to the ER         Past Medical History:        Diagnosis  Date         ?  Lung cancer ([place])       ?  Prostate cancer ([place])       ?  Stroke [facility])            PT STATES, "I HAD A STROKE A LONG TIME AGO"             Past Surgical History:         Procedure  Laterality  Date          ?  CHEST SURGERY PROCEDURE UNLISTED    04/27/2017          BRONCHOSCOPY/MEDIASTINOSCOPY          ?  HX GI              HERNIA REPAIR          ?  HX ORTHOPAEDIC    1994          ROD IN RIGHT LEG           No Known Allergies        Current Outpatient Medications          Medication  Sig  Dispense  Refill           ?  apixaban (ELIQUIS) 5 mg tablet  Take 1 Tab by mouth two (2) times a day.  60 Tab  12     ?  magnesium hydroxide (PHILLIPS MILK OF MAGNESIA) 400 mg/5 mL suspension  Take 30 mL by mouth daily as needed for Constipation.         ?  predniSONE (DELTASONE) 5 mg tablet  Take 1 Tab by mouth  daily. Restart on Jun 01, 2017  30 Tab  6     ?  abiraterone (ZYTIGA) 500 mg tab  Take 1,000 mg by mouth daily.  60 Tab  3     ?  polyethylene glycol (MIRALAX) 17 gram packet  Take 1 Packet by mouth daily.  30 Packet  2     ?  magic mouthwash solution  Magic mouth wash    Maalox   Lidocaine 2% viscous    Diphenhydramine oral solution    Nystatin 100,000 units      Pharmacy to mix equal portions of ingredients to a total volume as indicated in the dispense amount.   Swish and spit 66m (1 tsp) four times daily as needed for mouth sores  480 mL  3           ?  senna (SENOKOT) 8.6 mg tablet  Take 1 Tab by mouth nightly. Indications: constipation  30 Tab  0             Social History          Socioeconomic History         ?  Marital status:  SINGLE              Spouse name:  Not on file         ?  Number of children:  Not on file     ?  Years of education:  Not on file     ?  Highest education level:  Not on file       Tobacco Use         ?  Smoking status:  Former Smoker              Packs/day:  0.50         Years:  47.00         Pack years:  23.50  Last attempt to quit:  06/2016         Years since quitting:  2.1         ?  Smokeless tobacco:  Never Used       Substance and Sexual Activity         ?  Alcohol use:  No         ?  Drug use:  No             Family History         Problem  Relation  Age of Onset          ?  Cancer  Mother                COLON          ?  Cancer  Father                BRAIN TUMOR          ?  No Known Problems  Sister       ?  Arthritis-osteo  Brother       ?  No Known Problems  Sister       ?  No Known Problems  Sister       ?  No Known Problems  Sister       ?  No Known Problems  Brother            ?  Anesth Problems  Neg Hx          ROS   A review of systems was obtained and is negative except as listed in HPI.   ECOG PS is 1      Physical Examination:    Visit Vitals      BP  140/79     Pulse  86     Temp  98.1 ??F (36.7 ??C)     Resp  22        SpO2  100%        General appearance -  alert, In pain, appears fatigued and ill   Mental status - oriented to person, place, and time   Mouth - mucous membranes moist, no lesions noted   Neck - supple, no significant adenopathy   Resp-CTA, RLL diminished,normal respiratory effort   ABD- soft, TTP over RUQ   Neurological - normal speech, no focal findings or movement disorder noted   Musculoskeletal - no joint tenderness, deformity or swelling   Extremities - peripheral pulses normal, 3+ right LE edema up to scrotum, 2+ LLE edema, no clubbing or cyanosis   Skin - robotic incision site and previous chest tube site on right side of chest appear to be healing appropriately      LABS     Lab Results         Component  Value  Date/Time            WBC  5.5  08/26/2018 02:25 PM       HGB  7.3 (L)  08/26/2018 02:25 PM       HCT  22.7 (L)  08/26/2018 02:25 PM       PLATELET  219  08/26/2018 02:25 PM       MCV  85.7  08/26/2018 02:25 PM            ABS. NEUTROPHILS  3.9  08/26/2018 02:25 PM          Lab Results  Component  Value  Date/Time            Sodium  133 (L)  08/26/2018 02:25 PM       Potassium  3.9  08/26/2018 02:25 PM       Chloride  96 (L)  08/26/2018 02:25 PM       CO2  28  08/26/2018 02:25 PM       Glucose  94  08/26/2018 02:25 PM       BUN  79 (H)  08/26/2018 02:25 PM       Creatinine  8.52 (H)  08/26/2018 02:25 PM       GFR est AA  8 (L)  08/26/2018 02:25 PM       GFR est non-AA  6 (L)  08/26/2018 02:25 PM            Calcium  8.7  08/26/2018 02:25 PM          Lab Results         Component  Value  Date/Time            AST (SGOT)  26  08/26/2018 11:41 AM       Alk. phosphatase  88  08/26/2018 11:41 AM       Protein, total  8.2  08/26/2018 11:41 AM       Albumin  2.8 (L)  08/26/2018 11:41 AM       Globulin  5.4 (H)  08/26/2018 11:41 AM            A-G Ratio  0.5 (L)  08/26/2018 11:41 AM          Recent Labs                 08/26/18   1141  07/05/18   0850  05/11/18   0959  03/24/18   0949  02/17/18   0851  01/01/18   0811  11/27/17   0806     PSA   0.1  0.0*   --   0.1  0.1  0.1  0.1              PSALT   --    --   <0.1   --    --    --    --            IMAGING   PET CT  03/23/17   IMPRESSION: Mass lesion right lower lobe is hypermetabolic and compatible with   the biopsy confirmed the result of adenocarcinoma. There are soft tissue   densities in the anterior abdominal wall bilaterally which are stable compared   to the prior chest abdomen pelvis CT but new compared to the prior PET/CT.   Please correlate with any recent abdominal wall surgery as these may represent   port sites. Otherwise normal tracer distribution. Stable sclerotic osseous   metastatic disease without abnormal activity.      PATHOLOGY   Supraclavicular node    CYTOLOGIC INTERPRETATION:    Adenocarcinoma, consistent with prostate primary    See comment    General Categorization    Positive for malignancy.    Specimen Adequacy    Satisfactory for evaluation.    Comment    Touch preps and a core biopsy are examined and show islands of adenocarcinoma surrounded by fibrous stroma. A panel of immunohistochemical stains was performed  to evaluate site of origin. The tumor cells are focally positive for PSA and PSAP and negative  for CK7, CK20, TTF-1      CT CAP 03/22/18   IMPRESSION   IMPRESSION:    1. Status post right lung surgery with right perihilar consolidation and   scarring consistent with post surgery and radiation therapy change. These   findings are stable.   2. Enlarged prostate.   3. Sclerotic bone metastases unchanged.   4. Atherosclerotic aorta without aneurysm      CT CAP 07/13/18:    IMPRESSION:    1. Increased size of the prostate and increased nodularity involving the bladder   base and bladder wall.   2. New left hydronephrosis and hydroureter with obstruction at the level of the   ureterovesical junction.   3. Status post right lung surgery with elevation of the right hemidiaphragm and   surgical staples and scarring in the right hilum consistent with postsurgical   and  postradiation therapy effect, unchanged.   4. Sclerotic bone metastases to the spine unchanged.   5. New low-attenuation liver lesion could represent a metastasis.   6. Dilated fluid-filled esophagus suggesting esophageal dysmotility with   thickened wall possibly secondary to radiation therapy.   7. Atherosclerotic aorta with coronary artery calcifications. No abdominal   aortic aneurysm.   ??   Pathology from liver biopsy 08/17/18:    Specimen Source    1: Liver, Core biopsy with touch intepretation:    CYTOLOGIC INTERPRETATION:    1. Liver, Core biopsy with touch intepretation:    Poorly differentiated adenocarcinoma consistent with metastatic pulmonary adenocarcinoma    See comment    General Categorization    Positive for malignancy.          ASSESSMENT   Bruce Clark is a  68 y.o. male with widely metastatic prostate adenocarcinoma and lung adenocarcinoma status  post right lower lobe lobectomy on 05/22/17. Most recent imaging concerning for progression with new liver mets. He is admitted for acute renal failure      PLAN      Acute renal failure   Suspect this is post obstructive   His CT in Nov 2019 showed Left hydronephrosis due to obstruction at the UVJ likely from progressive prostate metastasis involving the bladder   Unfortunately patient was non compliant and did not follow up with oncology or urology   I agree with a CT CAP WO contrast and a urology consult   It is likely that he may end up need PCN       Lower extremity edema and h/o DVT   Diagnosed 04/22/16   Cancer associated   On lifelong Eliquis 33m BID    Now with marked RLE   Venous duplex obtained in ER with results pending    If he has an acute DVT start Heparin gtt    Hold Eliquis given his ARF         Prostate cancer stage IV   Castrate sensitive, treatment na??ve widely metastatic prostate cancer to lymph nodes above and below the diaphragm and bones.   Prostate adenocarcinoma diagnosed on biopsy of the supraclavicular lymph node. Discussed in  tumor board and a small cell component has been ruled out.   PSA at the time of diagnosis at 2400.   ??   Currently on Lupron 45 mg every 6 months. On Zytiga 1,0044mdaily + prednisone 5 mg daily which has controlled his disease now for > 2 years   Though his PSA is still undetectable at 0.1 his prior scans especially the bladder masses  suggest progressive prostate cancer and probable castrate resistance   We will hold Zytiga inpatient and discuss options after repeat CT scans   ??   R lung adenocarcinoma, T3N2MX stage IIIB- Parietal  pleural involvement- PD-L1 99%   S/p RLL lobectomy on 05/22/17 then received PORT   Now with biopsy proven liver metastases   Repeat CT CAP pending   Plan on palliative Keytruda OP         Bone metastases   Diffuse   Completed palliative radiation that was initiated on 06/09/16         Right sided chest wall pain   Unclear cause   No corresponding mets- CT chest awaited   Consult palliative care      Anemia    Worsening, likely due to progressive cancer, renal failure   Hold eliquis and transfuse if < 7 g/dl         Will continue to follow       Seen in conjunction with Mare Loan NP         Juliene Pina MD, [facility]   '

## 2018-08-26 NOTE — Progress Notes (Signed)
Received pt from ED via stretcher. VS noted and pt placed on monitor. Dual skin assessment completed with Okey Regal, RN. Skin intact. Dry areas noted to bilateral feet. Lotion applied. Right leg 3-4+ edema. At present pt denies pain. BP elevated. MD aware and asked to recheck BP after pt settled onto unit.

## 2018-08-26 NOTE — Consults (Signed)
[place]    Name:  Bruce Clark, Bruce Clark  MR#:  [id number]  DOB:  [birth year]  ACCOUNT #:  [id number]  DATE OF SERVICE:  08/27/2018    RENAL CONSULTATION    REQUESTED BY:  Emilio Math, MD.    REASON FOR CONSULTATION:  Acute renal failure.  The patient was seen and examined.  History obtained from the patient and chart review.    HISTORY OF PRESENT ILLNESS:  The patient is a 68 year old African-American male with past medical history significant for metastatic prostate and lung cancer; history of DVT, on Eliquis; anemia, who presents from Oncology office with evidence of significant and advanced acute kidney injury with a creatinine of 8.  He also has been having increasing right lower extremity swelling and increased shortness of breath with exertion.  He has had relapse of metastatic lung cancer with spread to his liver.  He was also complaining of decreased appetite, difficulty sleeping, difficulty lying flat, mild hematuria and constipation.  He also had difficulty urinating and some scrotal swelling.    In the ED, he was found to have right lower extremity DVT and was started on IV heparin drip.  Admission labs notable for dense AKI with a BUN of 81, creatinine of 8.71.  He had initially postvoid residual of 800 mL on bladder scan and subsequently made around 350 mL urine post Foley catheter placement.  Overnight, he has remained anuric.  Creatinine initially improved to 8.52 but is up to 8.96 this morning.  Potassium and bicarb are normal.  His baseline kidney function was normal as of 06/2018.    He denies taking any NSAIDs.  His vitals have been relatively stable.  He has been seen by Urology and IR has been consulted for bilateral percutaneous nephrostomy tube placement.    REVIEW OF SYSTEMS:  As noted in HPI.  Remainder complete 10-point ROS obtained and negative.    Past Medical History:   Diagnosis Date   ??? Lung cancer ([place])    ??? Prostate cancer ([place])    ??? Stroke ([place])      PT STATES, "I HAD A STROKE A LONG TIME AGO"     Past Surgical History:   Procedure Laterality Date   ??? CHEST SURGERY PROCEDURE UNLISTED  04/27/2017    BRONCHOSCOPY/MEDIASTINOSCOPY   ??? HX GI      HERNIA REPAIR   ??? HX ORTHOPAEDIC  1994    ROD IN RIGHT LEG   ??? IR NEPHROSTOMY PERC LT PLC CATH  SI  08/27/2018   ??? IR NEPHROSTOMY PERC RT PLC CATH  SI  08/27/2018     No Known Allergies  Medication Documentation Review Audit     Reviewed by Thurnell Garbe, PHARMD (Pharmacist) on 08/26/18 at 1640    Medication Sig Documenting Provider Last Dose Status Taking?   abiraterone (ZYTIGA) 500 mg tab Take 1,000 mg by mouth daily. Mare Loan, NP 08/26/2018 Unknown time Active Yes   apixaban (ELIQUIS) 5 mg tablet Take 1 Tab by mouth two (2) times a day. Mare Loan, NP 08/26/2018 Unknown time Active Yes   oxyCODONE-acetaminophen (PERCOCET) 5-325 mg per tablet Take 1 Tab by mouth every four (4) hours as needed for Pain. Provider, Historical 08/23/2018 Active Yes   predniSONE (DELTASONE) 5 mg tablet Take 1 Tab by mouth daily. Restart on Jun 01, 2017 Mare Loan, NP 08/25/2018 Unknown time Active Yes  Social History     Tobacco Use   ??? Smoking status: Former Smoker     Packs/day: 0.50     Years: 47.00     Pack years: 23.50     Last attempt to quit: 06/2016     Years since quitting: 2.1   ??? Smokeless tobacco: Never Used   Substance Use Topics   ??? Alcohol use: No   ??? Drug use: No     Family History   Problem Relation Age of Onset   ??? Cancer Mother         COLON   ??? Cancer Father         BRAIN TUMOR   ??? No Known Problems Sister    ??? Arthritis-osteo Brother    ??? No Known Problems Sister    ??? No Known Problems Sister    ??? No Known Problems Sister    ??? No Known Problems Brother    ??? Anesth Problems Neg Hx          PHYSICAL EXAMINATION:  GENERAL:  Elderly male, frail, cachectic appearing, in no acute distress.  He is lying semi-supine comfortably.  VITAL SIGNS:  Vitals are stable.  He is afebrile.  Pulse 86, BP 175 to 102 systolic, 87 to 92  diastolic, respirations of 16, saturating 98% on room air.  Weight is 68.4 kg.  HEENT:  Head is atraumatic, normocephalic.  Bitemporal wasting.  Conjunctivae are pale.  No scleral icterus.  Mucous membranes are slightly dry.  NECK:  No JVD.  LUNGS:  Clear to auscultation with slight diminished breath sounds at bases.  No distress.  HEART:  Regular rate and rhythm.  Normal S1, S2.  ABDOMEN:  Soft, nontender, active bowel sounds.  EXTREMITIES:  No clubbing, cyanosis or edema.  GENITOURINARY:  Foley catheter in place with minimal clear urine in the bag.  SKIN:  Without any rashes or jaundice.  CENTRAL NERVOUS SYSTEM:  He is alert, awake.  He appears to be poor historian.  PSYCHIATRIC:  No agitation.    LABORATORY DATA:  As reviewed in HPI.  In addition, his sodium is 130.  Urinalysis from yesterday was essentially normal without any proteinuria, hematuria.  CBC was normal except anemia with a hemoglobin of 8.1.  CT of abdomen and pelvis showed increasing liver lesions with hepatic metastatic disease, bilateral renal pelvocaliectasis and ureteral distention, new on the right and increased on the left.  Osteosclerotic metastatic disease and anasarca are noted in interval in the abdominal cavity as well as pelvic subcutaneous edema.    ASSESSMENT:  1.  Acute kidney injury.  This appears mostly due to obstructive uropathy.  There is no obvious other cause to explain the worsening kidney function.  He did not have any overt fluid losses or exposure to nephrotoxins.  Baseline kidney function was normal couple months ago.  He has bilateral obstructive findings on his CT scan.  This is related to his metastatic prostate cancer.  2.  Obstructive uropathy.  3.  Metastatic prostate and lung cancer.  4.  Hypertension.  5.  Hyponatremia.  6.  Right lower extremity deep vein thrombus which is new in spite of being on Eliquis.    RECOMMENDATIONS:  1.  Urology has been consulted.  They have recommended IR consult for percutaneous  nephrostomy tube placement.  2.  Avoid nephrotoxins.  3.  My expect is AKI to improve post PNT placement.  4.  No emergent indication for dialysis.  5.  If his renal function does not improve post PNT, we may have to consider dialysis.  He does not seem to be a good candidate for dialysis given his widespread metastatic lung and prostate cancer, now with increasing liver mets.  6.  IV heparin per primary team.  7.  Daily labs.  8.  Monitor Is and Os as possible.    Prognosis is guarded.  Discussed with the patient.    Thanks for renal consult.  We will follow the patient with you.      Lindaann Pascal, MD      [id number]  D:  08/27/2018 10:58  T:  08/27/2018 12:10  JOB #:  [id number]

## 2018-08-26 NOTE — Progress Notes (Signed)
 Admission Medication Reconciliation:    Information obtained from:  Patient's prescription bottles  RxQuery data available:  YES    Comments/Recommendations: Updated PTA meds/reviewed patient's allergies.    1)  Medication reconciliation interview completed with the patient and his medical power of attorney.  Patient's current prescription bottles were provided.  The patient took his zytiga and apixaban today but did not take his prednisone today due to not eating.  He reports not needed in the percocet in several days.    2)  Medication changes (since last review):  Added  - Percocet 5/325mg  by mouth every 4 hours as needed for pain.     Adjusted  - none    Removed  - magic mouthwash, milk of magnesia, miralax, and senna (not being utilized currently).     3)  Thank you for allowing me to participate in the care of this patient. If there are any further questions, please contact the pharmacy at [phone] or the medication reconciliation pharmacist at [phone].    Alm Adu, Pharm.D., BCPS       RxQuery pharmacy benefit data reflects medications filled and processed through the patient's insurance, however   this data does NOT capture whether the medication was picked up or is currently being taken by the patient.    Allergies:  Patient has no known allergies.    Significant PMH/Disease States:   Past Medical History:   Diagnosis Date   . Lung cancer ([facility])    . Prostate cancer ([facility])    . Stroke [facility])     PT STATES, I HAD A STROKE A LONG TIME AGO     Chief Complaint for this Admission:    Chief Complaint   Patient presents with   . Referral / Consult     Prior to Admission Medications:   Prior to Admission Medications   Prescriptions Last Dose Informant Taking?   abiraterone (ZYTIGA) 500 mg tab 08/26/2018 at Unknown time  Yes   Sig: Take 1,000 mg by mouth daily.   apixaban (ELIQUIS) 5 mg tablet 08/26/2018 at Unknown time  Yes   Sig: Take 1 Tab by mouth two (2) times a day.   oxyCODONE-acetaminophen (PERCOCET) 5-325 mg  per tablet 08/23/2018  Yes   Sig: Take 1 Tab by mouth every four (4) hours as needed for Pain.   predniSONE (DELTASONE) 5 mg tablet 08/25/2018 at Unknown time  Yes   Sig: Take 1 Tab by mouth daily. Restart on Jun 01, 2017      Facility-Administered Medications: None       Please contact the main inpatient pharmacy with any questions or concerns at [phone] and we will direct you to the clinical pharmacist covering this patient's care while in-house.   Alm JONETTA Adu, PHARMD

## 2018-08-27 ENCOUNTER — Inpatient Hospital Stay: Admit: 2018-08-27 | Payer: MEDICARE | Primary: Internal Medicine

## 2018-08-27 LAB — APTT
aPTT: >130 s (ref 22.1–32.0)
aPTT: 51.2 s — ABNORMAL HIGH (ref 22.1–32.0)
aPTT: 58.1 s — ABNORMAL HIGH (ref 22.1–32.0)

## 2018-08-27 LAB — CBC
Hematocrit: 25 % — ABNORMAL LOW (ref 36.6–50.3)
Hemoglobin: 8.1 g/dL — ABNORMAL LOW (ref 12.1–17.0)
MCH: 27.9 PG (ref 26.0–34.0)
MCHC: 32.4 g/dL (ref 30.0–36.5)
MCV: 86.2 FL (ref 80.0–99.0)
MPV: 9.8 FL (ref 8.9–12.9)
NRBC Absolute: 0 10*3/uL (ref 0.00–0.01)
Nucleated RBCs: 0 PER 100 WBC
Platelets: 263 10*3/uL (ref 150–400)
RBC: 2.9 M/uL — ABNORMAL LOW (ref 4.10–5.70)
RDW: 12.9 % (ref 11.5–14.5)
WBC: 5.3 10*3/uL (ref 4.1–11.1)

## 2018-08-27 LAB — BASIC METABOLIC PANEL
Anion Gap: 10 mmol/L (ref 5–15)
BUN: 80 MG/DL — ABNORMAL HIGH (ref 6–20)
Bun/Cre Ratio: 9 — ABNORMAL LOW (ref 12–20)
CO2: 25 mmol/L (ref 21–32)
Calcium: 9.2 MG/DL (ref 8.5–10.1)
Chloride: 95 mmol/L — ABNORMAL LOW (ref 97–108)
Creatinine: 8.96 MG/DL — ABNORMAL HIGH (ref 0.70–1.30)
EGFR IF NonAfrican American: 6 mL/min/{1.73_m2} — ABNORMAL LOW (ref >60)
GFR African American: 7 mL/min/{1.73_m2} — ABNORMAL LOW (ref >60)
Glucose: 109 mg/dL — ABNORMAL HIGH (ref 65–100)
Potassium: 4.1 mmol/L (ref 3.5–5.1)
Sodium: 130 mmol/L — ABNORMAL LOW (ref 136–145)

## 2018-08-27 LAB — CBC W/O DIFF
ABSOLUTE NRBC: 0 10*3/uL (ref 0.00–0.01)
HCT: 25 % — ABNORMAL LOW (ref 36.6–50.3)
HGB: 8.1 g/dL — ABNORMAL LOW (ref 12.1–17.0)
MCH: 27.9 PG (ref 26.0–34.0)
MCHC: 32.4 g/dL (ref 30.0–36.5)
MCV: 86.2 FL (ref 80.0–99.0)
MPV: 9.8 FL (ref 8.9–12.9)
NRBC: 0 PER 100 WBC
PLATELET: 263 10*3/uL (ref 150–400)
RBC: 2.9 M/uL — ABNORMAL LOW (ref 4.10–5.70)
RDW: 12.9 % (ref 11.5–14.5)
WBC: 5.3 10*3/uL (ref 4.1–11.1)

## 2018-08-27 LAB — PTT
aPTT: >130 s — CR (ref 22.1–32.0)
aPTT: 51.2 s — ABNORMAL HIGH (ref 22.1–32.0)
aPTT: 58.1 s — ABNORMAL HIGH (ref 22.1–32.0)

## 2018-08-27 LAB — METABOLIC PANEL, BASIC
Anion gap: 10 mmol/L (ref 5–15)
BUN/Creatinine ratio: 9 — ABNORMAL LOW (ref 12–20)
BUN: 80 MG/DL — ABNORMAL HIGH (ref 6–20)
CO2: 25 mmol/L (ref 21–32)
Calcium: 9.2 MG/DL (ref 8.5–10.1)
Chloride: 95 mmol/L — ABNORMAL LOW (ref 97–108)
Creatinine: 8.96 MG/DL — ABNORMAL HIGH (ref 0.70–1.30)
GFR est AA: 7 mL/min/{1.73_m2} — ABNORMAL LOW (ref >60)
GFR est non-AA: 6 mL/min/{1.73_m2} — ABNORMAL LOW (ref >60)
Glucose: 109 mg/dL — ABNORMAL HIGH (ref 65–100)
Potassium: 4.1 mmol/L (ref 3.5–5.1)
Sodium: 130 mmol/L — ABNORMAL LOW (ref 136–145)

## 2018-08-27 MED ORDER — CEFAZOLIN 2 GRAM/20 ML IN STERILE WATER INTRAVENOUS SYRINGE
2 gram/0 mL | Freq: Once | INTRAVENOUS | Status: AC
Start: 2018-08-27 — End: 2018-08-27
  Administered 2018-08-27: 21:00:00 via INTRAVENOUS

## 2018-08-27 MED ORDER — MIDAZOLAM 1 MG/ML IJ SOLN
1 mg/mL | INTRAMUSCULAR | Status: DC | PRN
Start: 2018-08-27 — End: 2018-08-27
  Administered 2018-08-27 (×2): via INTRAVENOUS

## 2018-08-27 MED ORDER — ONDANSETRON (PF) 4 MG/2 ML INJECTION
4 mg/2 mL | INTRAMUSCULAR | Status: DC | PRN
Start: 2018-08-27 — End: 2018-08-27

## 2018-08-27 MED ORDER — CEFAZOLIN 2 GRAM/20 ML IN STERILE WATER INTRAVENOUS SYRINGE
2 gram/0 mL | Freq: Once | INTRAVENOUS | Status: DC
Start: 2018-08-27 — End: 2018-08-27

## 2018-08-27 MED ORDER — FENTANYL CITRATE (PF) 50 MCG/ML IJ SOLN
50 mcg/mL | INTRAMUSCULAR | Status: DC | PRN
Start: 2018-08-27 — End: 2018-08-27
  Administered 2018-08-27 (×3): via INTRAVENOUS

## 2018-08-27 MED ORDER — SODIUM CHLORIDE 0.9 % IV
INTRAVENOUS | Status: DC
Start: 2018-08-27 — End: 2018-08-28
  Administered 2018-08-27: 21:00:00 via INTRAVENOUS

## 2018-08-27 MED ORDER — ONDANSETRON (PF) 4 MG/2 ML INJECTION
4 mg/2 mL | INTRAMUSCULAR | Status: AC
Start: 2018-08-27 — End: 2018-08-27
  Administered 2018-08-27: 21:00:00

## 2018-08-27 MED ORDER — LIDOCAINE HCL 2 % (20 MG/ML) IJ SOLN
20 mg/mL (2 %) | Freq: Once | INTRAMUSCULAR | Status: AC
Start: 2018-08-27 — End: 2018-08-27
  Administered 2018-08-27: 21:00:00 via SUBCUTANEOUS

## 2018-08-27 MED ORDER — FLU VACCINE QV 2019-20 (6 MOS+)(PF) 60 MCG (15 MCG X 4)/0.5 ML IM SYRINGE
60 mcg (15 mcg x 4)/0.5 mL | INTRAMUSCULAR | Status: AC
Start: 2018-08-27 — End: 2018-08-27
  Administered 2018-08-27: 15:00:00 via INTRAMUSCULAR

## 2018-08-27 MED ORDER — IOPAMIDOL 61 % IV SOLN
30061 mg iodine /mL (61 %) | Freq: Once | INTRAVENOUS | Status: AC
Start: 2018-08-27 — End: 2018-08-27
  Administered 2018-08-27: 22:00:00 via INTRAVENOUS

## 2018-08-27 MED FILL — ISOVUE-300  61 % INTRAVENOUS SOLUTION: 300 mg iodine /mL (61 %) | INTRAVENOUS | Qty: 100

## 2018-08-27 MED FILL — HEPARIN (PORCINE) IN D5W 25,000 UNIT/250 ML IV: 25000 unit/250 mL(100 unit/mL) | INTRAVENOUS | Qty: 250

## 2018-08-27 MED FILL — ONDANSETRON (PF) 4 MG/2 ML INJECTION: 4 mg/2 mL | INTRAMUSCULAR | Qty: 2

## 2018-08-27 MED FILL — OXYCODONE 5 MG TAB: 5 mg | ORAL | Qty: 2

## 2018-08-27 MED FILL — SENNA LAX 8.6 MG TABLET: 8.6 mg | ORAL | Qty: 1

## 2018-08-27 MED FILL — PREDNISONE 5 MG TAB: 5 mg | ORAL | Qty: 1

## 2018-08-27 MED FILL — SODIUM CHLORIDE 0.9 % IV: INTRAVENOUS | Qty: 1000

## 2018-08-27 MED FILL — NORMAL SALINE FLUSH 0.9 % INJECTION SYRINGE: INTRAMUSCULAR | Qty: 10

## 2018-08-27 MED FILL — MIDAZOLAM 1 MG/ML IJ SOLN: 1 mg/mL | INTRAMUSCULAR | Qty: 5

## 2018-08-27 MED FILL — FENTANYL CITRATE (PF) 50 MCG/ML IJ SOLN: 50 mcg/mL | INTRAMUSCULAR | Qty: 4

## 2018-08-27 NOTE — Progress Notes (Signed)
Bedside and Verbal shift change report given to [place] (oncoming nurse) by Maryruth Hancock (offgoing nurse). Report included the following information SBAR, Kardex, Intake/Output, MAR, Recent Results and Cardiac Rhythm NSR.

## 2018-08-27 NOTE — Progress Notes (Signed)
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 male with stage IV  prostate and now stage IV NSCLC admitted with acute renal failure, R extensive DVT and R chest wall pain      Today   Pain well controlled   His leg is some what less swollen on heparin   Has a Foley   No fevers        Past Medical History:        Diagnosis  Date         ?  Lung cancer ([place])       ?  Prostate cancer ([place])       ?  Stroke [facility])            PT STATES, "I HAD A STROKE A LONG TIME AGO"             Past Surgical History:         Procedure  Laterality  Date          ?  CHEST SURGERY PROCEDURE UNLISTED    04/27/2017          BRONCHOSCOPY/MEDIASTINOSCOPY          ?  HX GI              HERNIA REPAIR          ?  HX ORTHOPAEDIC    1994          ROD IN RIGHT LEG           No Known Allergies        Current Facility-Administered Medications             Medication  Dose  Route  Frequency  Provider  Last Rate  Last Dose              ?  0.9% sodium chloride infusion   50 mL/hr  IntraVENous  CONTINUOUS  Mahajan, Namit, MD  50 mL/hr at 08/27/18 1555  50 mL/hr at 08/27/18 1555              ?  heparin 25,000 units in D5W 250 ml infusion   18-36 Units/kg/hr  IntraVENous  TITRATE  Emilio Math, MD     Stopped at 08/27/18 1314     ?  predniSONE (DELTASONE) tablet 5 mg   5 mg  Oral  DAILY  Emilio Math, MD     5 mg at 08/27/18 1002     ?  senna (SENOKOT) tablet 8.6 mg   1 Tab  Oral  QHS  Emilio Math, MD     8.6 mg at 08/26/18 2058     ?  sodium  chloride (NS) flush  5-40 mL   5-40 mL  IntraVENous  Q8H  Emilio Math, MD     10 mL at 08/27/18 1316     ?  sodium chloride (NS) flush 5-40 mL   5-40 mL  IntraVENous  PRN  Emilio Math, MD           ?  LORazepam (ATIVAN) tablet 0.5 mg   0.5 mg  Oral  BID PRN  Emilio Math, MD           ?  oxyCODONE IR (ROXICODONE) tablet 5-10 mg   5-10 mg  Oral  Q4H PRN  Emilio Math, MD     10 mg at 08/27/18 0559              ?  ondansetron (ZOFRAN) injection 4 mg   4 mg  IntraVENous  Q4H PRN  Emilio Math, MD                   Social History          Socioeconomic History         ?  Marital status:  SINGLE              Spouse name:  Not on file         ?  Number of children:  Not on file     ?  Years of education:  Not on file     ?  Highest education level:  Not on file       Tobacco Use         ?  Smoking status:  Former Smoker              Packs/day:  0.50         Years:  47.00         Pack years:  23.50         Last attempt to quit:  06/2016         Years since quitting:  2.1         ?  Smokeless tobacco:  Never Used       Substance and Sexual Activity         ?  Alcohol use:  No         ?  Drug use:  No             Family History         Problem  Relation  Age of Onset          ?  Cancer  Mother                COLON          ?  Cancer  Father                BRAIN TUMOR          ?  No Known Problems  Sister       ?  Arthritis-osteo  Brother       ?  No Known Problems  Sister       ?  No Known Problems  Sister       ?  No Known Problems  Sister       ?  No Known Problems  Brother            ?  Anesth Problems  Neg Hx  ROS   A review of systems was obtained and is negative except as listed in HPI.   ECOG PS is 1      Physical Examination:    Visit Vitals      BP  (!) 169/106 (BP 1 Location: Right arm, BP Patient Position: Prone)     Pulse  (!) 111     Temp  98.5 ??F (36.9 ??C)     Resp  17     Wt  150 lb 12.7 oz (68.4 kg)     SpO2  100%        BMI  23.62 kg/m??        General appearance - alert, In pain, appears fatigued and ill   Mental  status - oriented to person, place, and time   Mouth - mucous membranes moist, no lesions noted   Neck - supple, no significant adenopathy   ABD- soft, TTP over RUQ. Foley in place   Extremities - peripheral pulses normal, 1+ LLE edema         LABS     Lab Results         Component  Value  Date/Time            WBC  5.3  08/27/2018 03:55 AM       HGB  8.1 (L)  08/27/2018 03:55 AM       HCT  25.0 (L)  08/27/2018 03:55 AM       PLATELET  263  08/27/2018 03:55 AM       MCV  86.2  08/27/2018 03:55 AM            ABS. NEUTROPHILS  3.9  08/26/2018 02:25 PM          Lab Results         Component  Value  Date/Time            Sodium  130 (L)  08/27/2018 03:55 AM       Potassium  4.1  08/27/2018 03:55 AM       Chloride  95 (L)  08/27/2018 03:55 AM       CO2  25  08/27/2018 03:55 AM       Glucose  109 (H)  08/27/2018 03:55 AM       BUN  80 (H)  08/27/2018 03:55 AM       Creatinine  8.96 (H)  08/27/2018 03:55 AM       GFR est AA  7 (L)  08/27/2018 03:55 AM       GFR est non-AA  6 (L)  08/27/2018 03:55 AM            Calcium  9.2  08/27/2018 03:55 AM          Lab Results         Component  Value  Date/Time            AST (SGOT)  26  08/26/2018 11:41 AM       Alk. phosphatase  88  08/26/2018 11:41 AM       Protein, total  8.2  08/26/2018 11:41 AM       Albumin  2.8 (L)  08/26/2018 11:41 AM       Globulin  5.4 (H)  08/26/2018 11:41 AM            A-G Ratio  0.5 (L)  08/26/2018 11:41 AM          Recent Labs  08/26/18   1141  07/05/18   0850  05/11/18   0959  03/24/18   0949  02/17/18   0851  01/01/18   0811  11/27/17   0806     PSA  0.1  0.0*   --   0.1  0.1  0.1  0.1              PSALT   --    --   <0.1   --    --    --    --            IMAGING   PET CT  03/23/17   IMPRESSION: Mass lesion right lower lobe is hypermetabolic and compatible with   the biopsy confirmed the result of adenocarcinoma. There are soft tissue   densities in the anterior abdominal wall bilaterally which are stable compared   to the prior chest  abdomen pelvis CT but new compared to the prior PET/CT.   Please correlate with any recent abdominal wall surgery as these may represent   port sites. Otherwise normal tracer distribution. Stable sclerotic osseous   metastatic disease without abnormal activity.      PATHOLOGY   Supraclavicular node    CYTOLOGIC INTERPRETATION:    Adenocarcinoma, consistent with prostate primary    See comment    General Categorization    Positive for malignancy.    Specimen Adequacy    Satisfactory for evaluation.    Comment    Touch preps and a core biopsy are examined and show islands of adenocarcinoma surrounded by fibrous stroma. A panel of immunohistochemical stains was performed  to evaluate site of origin. The tumor cells are focally positive for PSA and PSAP and negative for CK7, CK20, TTF-1      CT CAP 03/22/18   IMPRESSION   IMPRESSION:    1. Status post right lung surgery with right perihilar consolidation and   scarring consistent with post surgery and radiation therapy change. These   findings are stable.   2. Enlarged prostate.   3. Sclerotic bone metastases unchanged.   4. Atherosclerotic aorta without aneurysm      CT CAP 07/13/18:    IMPRESSION:    1. Increased size of the prostate and increased nodularity involving the bladder   base and bladder wall.   2. New left hydronephrosis and hydroureter with obstruction at the level of the   ureterovesical junction.   3. Status post right lung surgery with elevation of the right hemidiaphragm and   surgical staples and scarring in the right hilum consistent with postsurgical   and postradiation therapy effect, unchanged.   4. Sclerotic bone metastases to the spine unchanged.   5. New low-attenuation liver lesion could represent a metastasis.   6. Dilated fluid-filled esophagus suggesting esophageal dysmotility with   thickened wall possibly secondary to radiation therapy.   7. Atherosclerotic aorta with coronary artery calcifications. No abdominal   aortic aneurysm.      CT  08/26/2018      IMPRESSION   IMPRESSION:   ??   1. Unchanged appearance of right hemithorax as above.   2. Increased number and size of hypoattenuating lesions of the liver consistent   with worsening hepatic metastatic disease.   3. Bilateral renal pelvocaliectasis and ureteral distention, new on the right   and increased on the left.   4. Foley catheter within urinary bladder which cannot be further assessed.   5. Osteosclerotic metastatic disease.  6. Anasarca appearance demonstrated in the interval.    ??   Pathology from liver biopsy 08/17/18:    Specimen Source    1: Liver, Core biopsy with touch intepretation:    CYTOLOGIC INTERPRETATION:    1. Liver, Core biopsy with touch intepretation:    Poorly differentiated adenocarcinoma consistent with metastatic pulmonary adenocarcinoma    See comment    General Categorization    Positive for malignancy.          ASSESSMENT   Bruce Clark is a  68 y.o. male with widely metastatic prostate adenocarcinoma and lung adenocarcinoma status  post right lower lobe lobectomy on 05/22/17. Most recent imaging concerning for progression with new liver mets. He is admitted for acute renal failure      PLAN      Acute renal failure   Post obstructive   CT with Bilateral renal pelvocaliectasis and ureteral distention, new on the right   and increased on the left- likely due to previously seen progressive prostate cancer possibly infiltrating the bladder   PCN bilaterally today   Urology and Nephrology inputs are appreciated   Ideally would be nice to have a cystoscopy to understand the intravesical disease and r/o a new bladder primary but at the moment not feasible as he is on anticoagulation      Lower extremity edema and h/o DVT   Diagnosed 04/22/16   Cancer associated   On lifelong Eliquis 17m BID    Now with marked RLE   Venous duplex obtained in ER with results pending    If he has an acute DVT   Agree with Heparin gtt    Hold Eliquis given his ARF      If GFR improves will  consider Lovenox         Prostate cancer stage IV   Castrate sensitive, treatment na??ve widely metastatic prostate cancer to lymph nodes above and below the diaphragm and bones.   Prostate adenocarcinoma diagnosed on biopsy of the supraclavicular lymph node. Discussed in tumor board and a small cell component has been ruled out.   PSA at the time of diagnosis at 2400.   ??   Currently on Lupron 45 mg every 6 months. On Zytiga 1,0014mdaily + prednisone 5 mg daily which has controlled his disease now for > 2 years   Though his PSA is still undetectable at 0.1 his prior scans especially the bladder masses suggest progressive prostate cancer and probable castrate resistance   We will hold Zytiga inpatient    [facility] and [facility]   R lung adenocarcinoma, [id number] stage IIIB- Parietal  pleural involvement- PD-L1 99%   S/p RLL lobectomy on 05/22/17 then received PORT   Now with biopsy proven liver metastases   Repeat CT CAP pending   Plan on palliative Keytruda OP         Bone metastases   Diffuse   Completed palliative radiation that was initiated on 06/09/16         Right sided chest wall pain   Unclear cause   No corresponding mets- CT chest awaited   Consult palliative care      Anemia    Worsening, likely due to progressive cancer, renal failure   Hold eliquis and transfuse if < 7 g/dl         Will continue to follow       Discussed with Dr. DuMargorie John  Juliene Pina MD, [facility]   '

## 2018-08-27 NOTE — Consults (Signed)
[place]    Name:  DERAN, BARRO  MR#:  [id number]  DOB:  08/13/1951  ACCOUNT #:  [id number]  DATE OF SERVICE:  08/27/2018    RENAL CONSULTATION    REQUESTED BY:  Emilio Math, MD.    REASON FOR CONSULTATION:  Acute renal failure.  The patient was seen and examined.  History obtained from the patient and chart review.    HISTORY OF PRESENT ILLNESS:  The patient is a 68 year old African-American male with past medical history significant for metastatic prostate and lung cancer; history of DVT, on Eliquis; anemia, who presents from Oncology office with evidence of significant and advanced acute kidney injury with a creatinine of 8.  He also has been having increasing right lower extremity swelling and increased shortness of breath with exertion.  He has had relapse of metastatic lung cancer with spread to his liver.  He was also complaining of decreased appetite, difficulty sleeping, difficulty lying flat, mild hematuria and constipation.  He also had difficulty urinating and some scrotal swelling.    In the ED, he was found to have right lower extremity DVT and was started on IV heparin drip.  Admission labs notable for dense AKI with a BUN of 81, creatinine of 8.71.  He had initially postvoid residual of 800 mL on bladder scan and subsequently made around 350 mL urine post Foley catheter placement.  Overnight, he has remained anuric.  Creatinine initially improved to 8.52 but is up to 8.96 this morning.  Potassium and bicarb are normal.  His baseline kidney function was normal as of 06/2018.    He denies taking any NSAIDs.  His vitals have been relatively stable.  He has been seen by Urology and IR has been consulted for bilateral percutaneous nephrostomy tube placement.    REVIEW OF SYSTEMS:  As noted in HPI.  Remainder complete 10-point ROS obtained and negative.    Past Medical History:   Diagnosis Date   ??? Lung cancer ([place])    ??? Prostate cancer ([place])    ??? Stroke ([place])      PT STATES, "I HAD A STROKE A LONG TIME AGO"     Past Surgical History:   Procedure Laterality Date   ??? CHEST SURGERY PROCEDURE UNLISTED  04/27/2017    BRONCHOSCOPY/MEDIASTINOSCOPY   ??? HX GI      HERNIA REPAIR   ??? HX ORTHOPAEDIC  1994    ROD IN RIGHT LEG   ??? IR NEPHROSTOMY PERC LT PLC CATH  SI  08/27/2018   ??? IR NEPHROSTOMY PERC RT PLC CATH  SI  08/27/2018     No Known Allergies  Medication Documentation Review Audit     Reviewed by Thurnell Garbe, PHARMD (Pharmacist) on 08/26/18 at 1640    Medication Sig Documenting Provider Last Dose Status Taking?   abiraterone (ZYTIGA) 500 mg tab Take 1,000 mg by mouth daily. Mare Loan, NP 08/26/2018 Unknown time Active Yes   apixaban (ELIQUIS) 5 mg tablet Take 1 Tab by mouth two (2) times a day. Mare Loan, NP 08/26/2018 Unknown time Active Yes   oxyCODONE-acetaminophen (PERCOCET) 5-325 mg per tablet Take 1 Tab by mouth every four (4) hours as needed for Pain. Provider, Historical 08/23/2018 Active Yes   predniSONE (DELTASONE) 5 mg tablet Take 1 Tab by mouth daily. Restart on Jun 01, 2017 Mare Loan, NP 08/25/2018 Unknown time Active Yes  Social History     Tobacco Use   ??? Smoking status: Former Smoker     Packs/day: 0.50     Years: 47.00     Pack years: 23.50     Last attempt to quit: 06/2016     Years since quitting: 2.1   ??? Smokeless tobacco: Never Used   Substance Use Topics   ??? Alcohol use: No   ??? Drug use: No     Family History   Problem Relation Age of Onset   ??? Cancer Mother         COLON   ??? Cancer Father         BRAIN TUMOR   ??? No Known Problems Sister    ??? Arthritis-osteo Brother    ??? No Known Problems Sister    ??? No Known Problems Sister    ??? No Known Problems Sister    ??? No Known Problems Brother    ??? Anesth Problems Neg Hx          PHYSICAL EXAMINATION:  GENERAL:  Elderly male, frail, cachectic appearing, in no acute distress.  He is lying semi-supine comfortably.  VITAL SIGNS:  Vitals are stable.  He is afebrile.  Pulse 86, BP 063 to 016  systolic, 87 to 92 diastolic, respirations of 16, saturating 98% on room air.  Weight is 68.4 kg.  HEENT:  Head is atraumatic, normocephalic.  Bitemporal wasting.  Conjunctivae are pale.  No scleral icterus.  Mucous membranes are slightly dry.  NECK:  No JVD.  LUNGS:  Clear to auscultation with slight diminished breath sounds at bases.  No distress.  HEART:  Regular rate and rhythm.  Normal S1, S2.  ABDOMEN:  Soft, nontender, active bowel sounds.  EXTREMITIES:  No clubbing, cyanosis or edema.  GENITOURINARY:  Foley catheter in place with minimal clear urine in the bag.  SKIN:  Without any rashes or jaundice.  CENTRAL NERVOUS SYSTEM:  He is alert, awake.  He appears to be poor historian.  PSYCHIATRIC:  No agitation.    LABORATORY DATA:  As reviewed in HPI.  In addition, his sodium is 130.  Urinalysis from yesterday was essentially normal without any proteinuria, hematuria.  CBC was normal except anemia with a hemoglobin of 8.1.  CT of abdomen and pelvis showed increasing liver lesions with hepatic metastatic disease, bilateral renal pelvocaliectasis and ureteral distention, new on the right and increased on the left.  Osteosclerotic metastatic disease and anasarca are noted in interval in the abdominal cavity as well as pelvic subcutaneous edema.    ASSESSMENT:  1.  Acute kidney injury.  This appears mostly due to obstructive uropathy.  There is no obvious other cause to explain the worsening kidney function.  He did not have any overt fluid losses or exposure to nephrotoxins.  Baseline kidney function was normal couple months ago.  He has bilateral obstructive findings on his CT scan.  This is related to his metastatic prostate cancer.  2.  Obstructive uropathy.  3.  Metastatic prostate and lung cancer.  4.  Hypertension.  5.  Hyponatremia.  6.  Right lower extremity deep vein thrombus which is new in spite of being on Eliquis.    RECOMMENDATIONS:   1.  Urology has been consulted.  They have recommended IR consult for percutaneous nephrostomy tube placement.  2.  Avoid nephrotoxins.  3.  My expect is AKI to improve post PNT placement.  4.  No emergent indication for dialysis.  5.  If his renal function does not improve post PNT, we may have to consider dialysis.  He does not seem to be a good candidate for dialysis given his widespread metastatic lung and prostate cancer, now with increasing liver mets.  6.  IV heparin per primary team.  7.  Daily labs.  8.  Monitor Is and Os as possible.    Prognosis is guarded.  Discussed with the patient.    Thanks for renal consult.  We will follow the patient with you.      Lindaann Pascal, MD      [id number]  D:  08/27/2018 10:58  T:  08/27/2018 12:10  JOB #:  [id number]

## 2018-08-27 NOTE — Consults (Addendum)
Requesting Provider: Emilio Math, MD - Reason for Consultation: "urinary retention"  Pre-existing [facility] Patient:   No                Patient: Bruce Clark MRN: [id number]  SSN: [id number]    Date of Birth: August 28, 1950  Age: 68 y.o.  Sex: male     Location: 448/01       Code Status: DNR   PCP: Juliene Pina, MD  - [phone]   Emergency Contact:  Primary Emergency Contact: Simmons,[place], Home Phone: [phone]   Race/Religion/Language: WHITE OR CAUCASIAN / NO PREFERENCE / Speaks ENGLISH   Payor: Payor: [place] MEDICARE / Plan: [place] MEDICARE PART A & B / Product Type: Medicare /    Prior Admission Data: 05/25/17 Winter 4W TELEMETRY     [name]   Hospitalized:  Hospital Day: 2 - Admitted 08/26/2018  1:21 PM   POD # * No surgery found *  by * Surgery not found * - Blood Loss: * No surgery found * * Surgery not found *     CONSULTANTS  IP CONSULT TO HOSPITALIST  IP CONSULT TO UROLOGY  IP CONSULT TO [place]    ICD-10-CM ICD-9-CM   1. AKI (acute kidney injury) ([place]) N17.9 584.9   2. Lower extremity edema R60.0 782.3   3. Urinary retention R33.9 788.20   4. Acute deep vein thrombosis (DVT) of femoral vein of right lower extremity (HCC) I82.411 453.41   5. Bone metastasis (HCC) C79.51 198.5   6. Malignant neoplasm of lower lobe of right lung (HCC) C34.31 162.5   7. Metastasis to bone of unknown primary (HCC) C79.51 198.5    C80.1 199.1   8. Prostate cancer ([place]) C61 185         Assessment/Plan:       Urinary Retention  AKI  - consult to IR. At this time we recommend bilateral percutaneous nephrostomy tube placement through IR.  - please obtain bladder scan and document in nursing note  - strict I&O documentation please       CC: Referral / Consult   HPI: He is a 68 y.o. male that was admitted with progressing malignancy of prostate and lung cancer, followed by Dr. Melony Overly, obstructive uropathy, AKI, DVT- on heparin drip, anemia, and elevated BNP. This patient is DNR.  The patient is currently on ADT and Zytiga for prostate cancer treatment.    [facility] was consulted d/t urinary retention. Foley catheter inserted yesterday. No change in Creatinine pre and post foley insertion. Per RN, yesterday the patient had 800 cc PVR with foley in place. Low UOP, roughly ~20 cc output today and 350 ml yesterday. CT shows foley catheter within urinary bladder.    afvss  Cr: 8.96  BUN: 80  Hgb: 8.1  WBC: wnl  UA: clear  Foley in place draining clear yellow UA.    CT abd/pelvis wo: 1. Unchanged appearance of right hemithorax as above.  2. Increased number and size of hypoattenuating lesions of the liver consistent  with worsening hepatic metastatic disease.  3. Bilateral renal pelvocaliectasis and ureteral distention, new on the right  and increased on the left.  4. Foley catheter within urinary bladder which cannot be further assessed.  5. Osteosclerotic metastatic disease.  6. Anasarca appearance demonstrated in the interval.         Temp (24hrs), Avg:98.1 ??F (36.7 ??C), Min:97.4 ??F (36.3 ??C), Max:98.8 ??F (37.1 ??C)  Urinary Status: Foley  Creatinine   Date/Time Value Ref Range Status   08/27/2018 03:55 AM 8.96 (H) 0.70 - 1.30 MG/DL Final   08/26/2018 02:25 PM 8.52 (H) 0.70 - 1.30 MG/DL Final   08/26/2018 11:41 AM 8.71 (H) 0.70 - 1.30 MG/DL Final   07/05/2018 08:50 AM 0.74 0.70 - 1.30 MG/DL Final   05/11/2018 09:59 AM 0.78 0.76 - 1.27 mg/dL Final     Current Antimicrobial Therapy (168h ago, onward)    None        Key Anti-Platelet Anticoagulant Meds             apixaban (ELIQUIS) 5 mg tablet (Taking) Take 1 Tab by mouth two (2) times a day.        Diet: DIET CARDIAC Regular  DIET ONE TIME MESSAGE -       Labs     Lab Results   Component Value Date/Time    WBC 5.3 08/27/2018 03:55 AM    HCT 25.0 (L) 08/27/2018 03:55 AM    PLATELET 263 08/27/2018 03:55 AM    Sodium 130 (L) 08/27/2018 03:55 AM    Potassium 4.1 08/27/2018 03:55 AM    Chloride 95 (L) 08/27/2018 03:55 AM     CO2 25 08/27/2018 03:55 AM    BUN 80 (H) 08/27/2018 03:55 AM    Creatinine 8.96 (H) 08/27/2018 03:55 AM    Glucose 109 (H) 08/27/2018 03:55 AM    Calcium 9.2 08/27/2018 03:55 AM    Magnesium 1.6 04/30/2016 03:46 AM    INR 1.2 (H) 08/26/2018 02:25 PM    Prostate Specific Ag 0.1 08/26/2018 11:41 AM     UA:   Lab Results   Component Value Date/Time    Color YELLOW/STRAW 08/26/2018 02:25 PM    Appearance CLEAR 08/26/2018 02:25 PM    Specific gravity 1.012 08/26/2018 02:25 PM    Specific gravity 1.025 04/22/2016 08:22 PM    pH (UA) 5.0 08/26/2018 02:25 PM    Protein NEGATIVE  08/26/2018 02:25 PM    Glucose NEGATIVE  08/26/2018 02:25 PM    Ketone NEGATIVE  08/26/2018 02:25 PM    Bilirubin NEGATIVE  08/26/2018 02:25 PM    Urobilinogen 0.2 08/26/2018 02:25 PM    Nitrites NEGATIVE  08/26/2018 02:25 PM    Leukocyte Esterase NEGATIVE  08/26/2018 02:25 PM    Epithelial cells FEW 08/26/2018 02:25 PM    Bacteria NEGATIVE  08/26/2018 02:25 PM    WBC 0-4 08/26/2018 02:25 PM    RBC 0-5 08/26/2018 02:25 PM     Imaging     Results for orders placed during the hospital encounter of 08/26/18   CT ABD PELV WO CONT    Narrative EXAM: CT ABD PELV WO CONT    INDICATION: sob hx cancer    COMPARISON: CT 07/13/2018    CONTRAST:  0 mL of Isovue-370.    TECHNIQUE:   Following the uneventful intravenous administration of contrast, thin axial  images were obtained through the chest, abdomen and pelvis. Coronal and sagittal  reconstructions were generated. Oral contrast was not administered. CT dose  reduction was achieved through use of a standardized protocol tailored for this  examination and automatic exposure control for dose modulation.    The lack of oral and IV contrast material substantially diminishes the capacity  of CT to and solid organs.    FINDINGS:     THYROID: No nodule.  MEDIASTINUM: No mass or lymphadenopathy.  HILA: No mass or lymphadenopathy.  THORACIC  AORTA: No dissection or aneurysm. Atherosclerotic calcifications.   MAIN PULMONARY ARTERY: Normal in caliber.  TRACHEA/BRONCHI: Patent.  ESOPHAGUS: No wall thickening or dilatation.  HEART: Atherosclerotic calcifications. Normal cardiac size.  PLEURA: Mild pleural thickening at the posterior and lateral mid and lower right  hemithorax.  LUNGS: Diminished right hemithoracic volume again shown. Inferior perihilar  pulmonary densities including dense opacification extending posteriorly to the  pleural margin with bronchiectasis. Appearance unchanged.  LIVER: Hypoattenuating lesions. Previously contrast enhanced study, a 14 mm  lesion is shown in segment 7. On this unenhanced study, the lesion now measures  up to 28 mm. There is also now demonstrated a medial segment left lobe lesion  measuring 18 mm in size.  GALLBLADDER: Unremarkable.  SPLEEN: No mass.  PANCREAS: No mass or ductal dilatation.  ADRENALS: Unremarkable.  KIDNEYS: Moderate left renal pelvocaliectasis and diffuse ureteral dilation  slightly worsened in the interval. Interval demonstration of moderate right  renal pelvocaliectasis and proximal ureteral distention.  STOMACH: Unremarkable.  SMALL BOWEL: No dilatation or wall thickening.  COLON: No dilatation or wall thickening.  APPENDIX: Not shown.  PERITONEUM: No ascites or pneumoperitoneum.  RETROPERITONEUM: No lymphadenopathy or aortic aneurysm.  REPRODUCTIVE ORGANS: Prostate gland enlargement again shown.  URINARY BLADDER: Cannot be assessed. There is a Foley catheter within urinary  bladder with small amount of nondependent air and collapsed appearance.  BONES: Osteosclerotic metastatic disease again shown with similar distribution  to that shown previously.  ADDITIONAL COMMENTS: Interval anasarca appearance with diffuse abdominal and  pelvic subcutaneous edema.      Impression IMPRESSION:    1. Unchanged appearance of right hemithorax as above.  2. Increased number and size of hypoattenuating lesions of the liver consistent  with worsening hepatic metastatic disease.   3. Bilateral renal pelvocaliectasis and ureteral distention, new on the right  and increased on the left.  4. Foley catheter within urinary bladder which cannot be further assessed.  5. Osteosclerotic metastatic disease.  6. Anasarca appearance demonstrated in the interval.          [place] Results (most recent):  Results from Hospital Encounter encounter on 08/17/18   US GUIDE BX LIV PERC    Narrative INDICATION: Lung cancer, prostate cancer. New liver lesion.    The risks, benefits and alternatives of ultrasound-guided liver biopsy were  discussed with the patient. After all questions were answered and informed  written consent was obtained, the patient was prepped and draped in the standard  sterile fashion. 1% lidocaine was used as local anesthetic. Fentanyl and Versed  were administered for conscious sedation. Using ultrasound guidance and an  18-gauge, 15 cm Temno needle, 1.9 cm right hepatic dome lesion was biopsied. 4  core specimens were obtained. There is no complication. There is no significant  blood loss. 100 mcg of fentanyl and 2 mg of Versed and were administered  intravenously for conscious sedation which was supervised by Dr. Randon Goldsmith.  Procedure time was 24 minutes. After observation arthrogram, the patient was  discharged in stable condition with specific postprocedure instructions.      Impression IMPRESSION: Ultrasound-guided biopsy of 1.9 cm right hepatic dome lesion. No  complication.       Cultures     All Micro Results     Procedure Component Value Units Date/Time    URINE CULTURE HOLD SAMPLE [[id number]] Collected:  08/26/18 1425    Order Status:  Completed Specimen:  Urine from Serum Updated:  08/26/18 1439     Urine culture hold  URINE ON HOLD IN MICROBIOLOGY DEPT FOR 3 DAYS. IF UNPRESERVED URINE IS SUBMITTED, IT CANNOT BE USED FOR ADDITIONAL TESTING AFTER 24 HRS, RECOLLECTION WILL BE REQUIRED.                 Past History: (Complete 2+/3 areas)   No Known Allergies    Current Facility-Administered Medications   Medication Dose Route Frequency   ??? influenza vaccine 2019-20 (6 mos+)(PF) (FLUARIX/FLULAVAL/FLUZONE QUAD) injection 0.5 mL  0.5 mL IntraMUSCular PRIOR TO DISCHARGE   ??? heparin 25,000 units in D5W 250 ml infusion  18-36 Units/kg/hr IntraVENous TITRATE   ??? predniSONE (DELTASONE) tablet 5 mg  5 mg Oral DAILY   ??? senna (SENOKOT) tablet 8.6 mg  1 Tab Oral QHS   ??? sodium chloride (NS) flush 5-40 mL  5-40 mL IntraVENous Q8H   ??? sodium chloride (NS) flush 5-40 mL  5-40 mL IntraVENous PRN   ??? LORazepam (ATIVAN) tablet 0.5 mg  0.5 mg Oral BID PRN   ??? oxyCODONE IR (ROXICODONE) tablet 5-10 mg  5-10 mg Oral Q4H PRN   ??? ondansetron (ZOFRAN) injection 4 mg  4 mg IntraVENous Q4H PRN    Prior to Admission medications    Medication Sig Start Date End Date Taking? Authorizing Provider   oxyCODONE-acetaminophen (PERCOCET) 5-325 mg per tablet Take 1 Tab by mouth every four (4) hours as needed for Pain.   Yes Provider, Historical   apixaban (ELIQUIS) 5 mg tablet Take 1 Tab by mouth two (2) times a day. 06/23/18  Yes Agee, Sarah, NP   predniSONE (DELTASONE) 5 mg tablet Take 1 Tab by mouth daily. Restart on Jun 01, 2017 01/21/18  Yes Mare Loan, NP   abiraterone (ZYTIGA) 500 mg tab Take 1,000 mg by mouth daily. 10/02/17  Yes Mare Loan, NP        PMHx:  has a past medical history of Lung cancer [facility]), Prostate cancer ([place]), and Stroke ([place]).   PSurgHx:  has a past surgical history that includes hx gi; hx orthopaedic (1994); and pr chest surgery procedure unlisted (04/27/2017).  PSocHx:  reports that he quit smoking about 2 years ago. He has a 23.50 pack-year smoking history. He has never used smokeless tobacco. He reports that he does not drink alcohol or use drugs.   ROS:  (Complete - 10 systems) - DENIES: Weightloss (Constitutional), Dry mouth (ENMT), Chest pain (CV), SOB (Respiratory), Constipation (GI), Weakness (MS), Pallor (Skin), TIA Sx (Neuro), Confusion (Psych), Easy bruising (Heme)     Physical Exam: (Comprehesive - 8+ 1995 Systems)     (1) Constitutional:  FIO2:   on SpO2: O2 Sat (%): 98 %  O2 Device: Room air    Patient Vitals for the past 24 hrs:   BP Temp Pulse Resp SpO2 Weight   08/27/18 0940 ??? ??? 86 ??? ??? ???   08/27/18 0927 (!) 154/92 98 ??F (36.7 ??C) 87 16 98 % ???   08/27/18 0341 121/87 98.1 ??F (36.7 ??C) 92 16 98 % ???   08/27/18 0234 ??? ??? ??? ??? ??? 68.4 kg (150 lb 12.7 oz)   08/26/18 2306 134/83 98 ??F (36.7 ??C) 83 15 99 % ???   08/26/18 1924 (!) 174/96 98.1 ??F (36.7 ??C) 82 16 100 % ???   08/26/18 1908 (!) 189/94 98.8 ??F (37.1 ??C) 80 18 100 % 65.3 kg (143 lb 15.4 oz)   08/26/18 1730 (!) 149/91 98.4 ??F (36.9 ??C) 82 16 100 % ???   08/26/18 1630  140/79 ??? 86 22 100 % ???   08/26/18 1439 168/89 ??? 87 21 100 % ???   08/26/18 1305 128/84 98.1 ??F (36.7 ??C) 82 20 100 % ???       Date 08/26/18 0700 - 08/27/18 0659 08/27/18 0700 - 08/28/18 0659   Shift 0700-1859 1900-0659 24 Hour Total 0700-1859 1900-0659 24 Hour Total   INTAKE   I.V. 1000  1000(0.6)        Volume (sodium chloride 0.9 % bolus infusion 1,000 mL) 1000  1000      Shift Total(mL/kg) 1000  1000(14.6)      OUTPUT   Urine  350(0.4) 350(0.2)        Urine Output (mL) (Urinary Catheter 08/26/18 2- way;Foley - Temperature)  350 350      Stool           Stool Occurrence(s)  1 x 1 x      Shift Total(mL/kg)  350(5.1) 350(5.1)      NET 1000 -350 650      Weight (kg)  68.4 68.4 68.4 68.4 68.4      (2) ENMT:   moist mucous membranes, normal sinuses   (3) Respiratory:  breathing easily, no distress   (4) GI:  no abdominal masses, tenderness   (5) GU:   Foley in place draining clear yellow urine, minimal UOP   (6) Lymphatic:  no adenopathy, neck supple   (7) Muscloskeletal:  no gross deformity, normal ROM   (8) Skin:  no rash, warm & dry   (9) Neuro:  Lethargic, alert, oriented, normal speech      Signed By: Neale Burly, NP  - August 27, 2018

## 2018-08-27 NOTE — Other (Signed)
TRANSFER - OUT REPORT:    Verbal report given to Sophea(name) on Bruce Clark  being transferred to (unit) for routine progression of care       Report consisted of patient???s Situation, Background, Assessment and   Recommendations(SBAR).     Information from the following report(s) SBAR, Procedure Summary, Intake/Output and MAR was reviewed with the receiving nurse.    Lines:   Peripheral IV 08/26/18 Left Antecubital (Active)   Site Assessment Clean, dry, & intact 08/27/2018 11:55 AM   Phlebitis Assessment 0 08/27/2018 11:55 AM   Infiltration Assessment 0 08/27/2018 11:55 AM   Dressing Status Clean, dry, & intact 08/27/2018 11:55 AM   Dressing Type Other (comments) 08/27/2018 11:55 AM   Hub Color/Line Status Green;Infusing 08/27/2018 11:55 AM   Action Taken Open ports on tubing capped 08/27/2018  3:41 AM   Alcohol Cap Used Yes 08/27/2018 11:55 AM        Opportunity for questions and clarification was provided.      Patient transported with:   [employer]

## 2018-08-27 NOTE — Telephone Encounter (Signed)
[name] called and wanted to speak with someone regarding pt's CT.    [phone]

## 2018-08-27 NOTE — Other (Signed)
Keep Foley in per GU NP.  Plan stenting in future.

## 2018-08-27 NOTE — Other (Addendum)
1740:  Call Angio to get report.    1755:  Paged Dr. Margorie John to make sure okay to resume Heparin gtt at last rate of 15 unit/kg/hr /9.9 ml/hr as right nephrostomy tube dsg is with drainage per report.  Plan to resume last rate setting.    1815:  Dr. Margorie John voiced to resume heparin gtt 4 hours from now (2215)

## 2018-08-27 NOTE — Other (Deleted)
Pt admitted with acute renal failure.   Pt noted to have low serum sodium. If possible, please document in the progress notes and discharge summary if you are evaluating and / or treating any of the following:    ? Hyponatremia  ? Clinically insignificant low serum sodium  ? Other, please specify  ? Clinically unable to determine    The medical record reflects the following:    Risk Factors: 68 year old admitted with AKI, Hx. Metastatic prostate and lung adenocarcinoma    Clinical Indicators: 1/10-Sodium 130    Treatment: monitor labwork    Please clarify and document your clinical opinion in the progress notes and discharge summary including the definitive and/or presumptive diagnosis, (suspected or probable), related to the above clinical findings. Please include clinical findings supporting your diagnosis.     REFERENCE:  Clinical research has shown that hyponatremia is strongly associated with an increased risk of mortality (ROM), even mild hyponatremia (serum Na levels 130-134 mEq/L) is associated with a 47% increase of inpatient mortality.      Thank you,     Forbes Cellar BSN, RN  [facility]   [phone]

## 2018-08-27 NOTE — Progress Notes (Addendum)
1218  Received a critical PTT (130) from lab. Heparin stopped. Paged hospitalist.   [phone]  Spoke with hospitalist ordered to follow the order set and monitor for bleeding. Marland Kitchen  0258  Restarted heparin. Decreased to 15 Units/Kg/Hour.

## 2018-08-27 NOTE — Progress Notes (Signed)
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 is a 68 y.o. male with stage IV prostate and now stage IV NSCLC admitted with acute renal failure, R extensive DVT and R chest wall pain    Today  Pain well controlled  His leg is some what less swollen on heparin  Has a Foley  No fevers    Past Medical History:   Diagnosis Date   ??? Lung cancer ([place])    ??? Prostate cancer ([place])    ??? Stroke ([place])     PT STATES, "I HAD A STROKE A LONG TIME AGO"       Past Surgical History:   Procedure Laterality Date   ??? CHEST SURGERY PROCEDURE UNLISTED  04/27/2017    BRONCHOSCOPY/MEDIASTINOSCOPY   ??? HX GI      HERNIA REPAIR   ??? HX ORTHOPAEDIC  1994    ROD IN RIGHT LEG       No Known Allergies    Current Facility-Administered Medications   Medication Dose Route Frequency Provider Last Rate Last Dose   ??? 0.9% sodium chloride infusion  50 mL/hr IntraVENous CONTINUOUS Dorene Grebe, Namit, MD 50 mL/hr at 08/27/18 1555 50 mL/hr at 08/27/18 1555   ??? heparin 25,000 units in D5W 250 ml infusion  18-36 Units/kg/hr IntraVENous TITRATE Emilio Math, MD   Stopped at 08/27/18 1314   ??? predniSONE (DELTASONE) tablet 5 mg  5 mg Oral DAILY Emilio Math, MD   5 mg at 08/27/18 1002   ??? senna (SENOKOT) tablet 8.6 mg  1 Tab Oral QHS Emilio Math, MD   8.6 mg at 08/26/18 2058   ??? sodium chloride (NS) flush 5-40 mL  5-40 mL IntraVENous Q8H Emilio Math, MD   10 mL at 08/27/18 1316   ??? sodium chloride (NS) flush 5-40 mL  5-40 mL IntraVENous PRN Emilio Math, MD       ??? LORazepam (ATIVAN) tablet 0.5 mg  0.5 mg Oral BID PRN Emilio Math, MD       ??? oxyCODONE IR (ROXICODONE) tablet 5-10 mg  5-10 mg Oral Q4H PRN Emilio Math, MD   10 mg at 08/27/18 0559   ??? ondansetron (ZOFRAN) injection 4 mg  4 mg IntraVENous Q4H PRN Emilio Math, MD           Social History     Socioeconomic History    ??? Marital status: SINGLE     Spouse name: Not on file   ??? Number of children: Not on file   ??? Years of education: Not on file   ??? Highest education level: Not on file   Tobacco Use   ??? Smoking status: Former Smoker     Packs/day: 0.50     Years: 47.00     Pack years: 23.50     Last attempt to quit: 06/2016     Years since quitting: 2.1   ??? Smokeless tobacco: Never Used   Substance and Sexual Activity   ??? Alcohol use: No   ??? Drug use: No       Family History   Problem Relation Age of Onset   ??? Cancer Mother         COLON   ??? Cancer Father  BRAIN TUMOR   ??? No Known Problems Sister    ??? Arthritis-osteo Brother    ??? No Known Problems Sister    ??? No Known Problems Sister    ??? No Known Problems Sister    ??? No Known Problems Brother    ??? Anesth Problems Neg Hx      ROS  A review of systems was obtained and is negative except as listed in HPI.  ECOG PS is 1    Physical Examination:   Visit Vitals  BP (!) 169/106 (BP 1 Location: Right arm, BP Patient Position: Prone)   Pulse (!) 111   Temp 98.5 ??F (36.9 ??C)   Resp 17   Wt 150 lb 12.7 oz (68.4 kg)   SpO2 100%   BMI 23.62 kg/m??     General appearance - alert, In pain, appears fatigued and ill  Mental status - oriented to person, place, and time  Mouth - mucous membranes moist, no lesions noted  Neck - supple, no significant adenopathy  ABD- soft, TTP over RUQ. Foley in place  Extremities - peripheral pulses normal, 1+ LLE edema      LABS  Lab Results   Component Value Date/Time    WBC 5.3 08/27/2018 03:55 AM    HGB 8.1 (L) 08/27/2018 03:55 AM    HCT 25.0 (L) 08/27/2018 03:55 AM    PLATELET 263 08/27/2018 03:55 AM    MCV 86.2 08/27/2018 03:55 AM    ABS. NEUTROPHILS 3.9 08/26/2018 02:25 PM     Lab Results   Component Value Date/Time    Sodium 130 (L) 08/27/2018 03:55 AM    Potassium 4.1 08/27/2018 03:55 AM    Chloride 95 (L) 08/27/2018 03:55 AM    CO2 25 08/27/2018 03:55 AM    Glucose 109 (H) 08/27/2018 03:55 AM    BUN 80 (H) 08/27/2018 03:55 AM     Creatinine 8.96 (H) 08/27/2018 03:55 AM    GFR est AA 7 (L) 08/27/2018 03:55 AM    GFR est non-AA 6 (L) 08/27/2018 03:55 AM    Calcium 9.2 08/27/2018 03:55 AM     Lab Results   Component Value Date/Time    AST (SGOT) 26 08/26/2018 11:41 AM    Alk. phosphatase 88 08/26/2018 11:41 AM    Protein, total 8.2 08/26/2018 11:41 AM    Albumin 2.8 (L) 08/26/2018 11:41 AM    Globulin 5.4 (H) 08/26/2018 11:41 AM    A-G Ratio 0.5 (L) 08/26/2018 11:41 AM     Recent Labs     08/26/18  1141 07/05/18  0850 05/11/18  0959 03/24/18  0949 02/17/18  0851 01/01/18  0811 11/27/17  0806   PSA 0.1 0.0*  --  0.1 0.1 0.1 0.1   PSALT  --   --  <0.1  --   --   --   --        IMAGING  PET CT  03/23/17  IMPRESSION: Mass lesion right lower lobe is hypermetabolic and compatible with  the biopsy confirmed the result of adenocarcinoma. There are soft tissue  densities in the anterior abdominal wall bilaterally which are stable compared  to the prior chest abdomen pelvis CT but new compared to the prior PET/CT.  Please correlate with any recent abdominal wall surgery as these may represent  port sites. Otherwise normal tracer distribution. Stable sclerotic osseous  metastatic disease without abnormal activity.    PATHOLOGY  Supraclavicular node   CYTOLOGIC INTERPRETATION:   Adenocarcinoma,  consistent with prostate primary   See comment   General Categorization   Positive for malignancy.   Specimen Adequacy   Satisfactory for evaluation.   Comment   Touch preps and a core biopsy are examined and show islands of adenocarcinoma surrounded by fibrous stroma. A panel of immunohistochemical stains was performed to evaluate site of origin. The tumor cells are focally positive for PSA and PSAP and negative for CK7, CK20, TTF-1    CT CAP 03/22/18  IMPRESSION  IMPRESSION:   1. Status post right lung surgery with right perihilar consolidation and  scarring consistent with post surgery and radiation therapy change. These  findings are stable.  2. Enlarged prostate.   3. Sclerotic bone metastases unchanged.  4. Atherosclerotic aorta without aneurysm    CT CAP 07/13/18:   IMPRESSION:   1. Increased size of the prostate and increased nodularity involving the bladder  base and bladder wall.  2. New left hydronephrosis and hydroureter with obstruction at the level of the  ureterovesical junction.  3. Status post right lung surgery with elevation of the right hemidiaphragm and  surgical staples and scarring in the right hilum consistent with postsurgical  and postradiation therapy effect, unchanged.  4. Sclerotic bone metastases to the spine unchanged.  5. New low-attenuation liver lesion could represent a metastasis.  6. Dilated fluid-filled esophagus suggesting esophageal dysmotility with  thickened wall possibly secondary to radiation therapy.  7. Atherosclerotic aorta with coronary artery calcifications. No abdominal  aortic aneurysm.    CT 08/26/2018    IMPRESSION  IMPRESSION:  ??  1. Unchanged appearance of right hemithorax as above.  2. Increased number and size of hypoattenuating lesions of the liver consistent  with worsening hepatic metastatic disease.  3. Bilateral renal pelvocaliectasis and ureteral distention, new on the right  and increased on the left.  4. Foley catheter within urinary bladder which cannot be further assessed.  5. Osteosclerotic metastatic disease.  6. Anasarca appearance demonstrated in the interval.   ??  Pathology from liver biopsy 08/17/18:   Specimen Source   1: Liver, Core biopsy with touch intepretation:   CYTOLOGIC INTERPRETATION:   1. Liver, Core biopsy with touch intepretation:   Poorly differentiated adenocarcinoma consistent with metastatic pulmonary adenocarcinoma   See comment   General Categorization   Positive for malignancy.       ASSESSMENT  Bruce Clark is a 68 y.o. male with widely metastatic prostate adenocarcinoma and lung adenocarcinoma status post right lower lobe  lobectomy on 05/22/17. Most recent imaging concerning for progression with new liver mets. He is admitted for acute renal failure    PLAN    Acute renal failure  Post obstructive  CT with Bilateral renal pelvocaliectasis and ureteral distention, new on the right  and increased on the left- likely due to previously seen progressive prostate cancer possibly infiltrating the bladder  PCN bilaterally today  Urology and Nephrology inputs are appreciated  Ideally would be nice to have a cystoscopy to understand the intravesical disease and r/o a new bladder primary but at the moment not feasible as he is on anticoagulation    Lower extremity edema and h/o DVT  Diagnosed 04/22/16  Cancer associated  On lifelong Eliquis 66m BID   Now with marked RLE  Venous duplex obtained in ER with results pending   If he has an acute DVT  Agree with Heparin gtt   Hold Eliquis given his ARF    If GFR improves will consider Lovenox  Prostate cancer stage IV  Castrate sensitive, treatment na??ve widely metastatic prostate cancer to lymph nodes above and below the diaphragm and bones.  Prostate adenocarcinoma diagnosed on biopsy of the supraclavicular lymph node. Discussed in tumor board and a small cell component has been ruled out.  PSA at the time of diagnosis at 2400.  ??  Currently on Lupron 45 mg every 6 months. On Zytiga 1,064m daily + prednisone 5 mg daily which has controlled his disease now for > 2 years  Though his PSA is still undetectable at 0.1 his prior scans especially the bladder masses suggest progressive prostate cancer and probable castrate resistance  We will hold Zytiga inpatient   [facility]  R lung adenocarcinoma, [id number] stage IIIB- Parietal pleural involvement- PD-L1 99%  S/p RLL lobectomy on 05/22/17 then received PORT  Now with biopsy proven liver metastases  Repeat CT CAP pending  Plan on palliative Keytruda OP      Bone metastases  Diffuse   Completed palliative radiation that was initiated on 06/09/16      Right sided chest wall pain  Unclear cause  No corresponding mets- CT chest awaited  Consult palliative care    Anemia   Worsening, likely due to progressive cancer, renal failure  Hold eliquis and transfuse if < 7 g/dl      Will continue to follow     Discussed with Dr. DVivi BarrackMD, [facility]  '

## 2018-08-27 NOTE — Progress Notes (Signed)
Reason for Admission:   AKI, obstructive uropathy                  RRAT Score:     16             Do you (patient/family) have any concerns for transition/discharge?   none                Plan for utilizing home health:   TBD     Current Advanced Directive/Advance Care Plan:  On file- his caregivers are his MPOA's( [place] 2nd - Jaclyn Shaggy)            Transition of Care Plan:        Home with PCP and urology/specialty follow-up and possible skilled home health services    Cm met with patient - he lives with his two friends - is independent with ADL's and onlyu occasionally uses a cane - has Medicare A, B and D and no other insurance.  Has no concerns regarding transition to home.  Friends will transport to home. Chales Abrahams, MSW    Care Management Interventions  PCP Verified by CM: Yes  MyChart Signup: No  Discharge Durable Medical Equipment: No  Physical Therapy Consult: No  Occupational Therapy Consult: No  Speech Therapy Consult: No  Current Support Network: Lives with Caregiver  Confirm Follow Up Transport: Friends  Discharge Location  Discharge Placement: Home with home health(TBD)

## 2018-08-27 NOTE — H&P (Signed)
Interventional and Vascular Radiology History and Physical    Patient: Bruce Clark 68 y.o. male       Chief Complaint: Referral / Consult      History of Present Illness: bilateral urinary obstruction     History:    Past Medical History:   Diagnosis Date   ??? Lung cancer ([place])    ??? Prostate cancer ([place])    ??? Stroke ([place])     PT STATES, "I HAD A STROKE A LONG TIME AGO"     Family History   Problem Relation Age of Onset   ??? Cancer Mother         COLON   ??? Cancer Father         BRAIN TUMOR   ??? No Known Problems Sister    ??? Arthritis-osteo Brother    ??? No Known Problems Sister    ??? No Known Problems Sister    ??? No Known Problems Sister    ??? No Known Problems Brother    ??? Anesth Problems Neg Hx      Social History     Socioeconomic History   ??? Marital status: SINGLE     Spouse name: Not on file   ??? Number of children: Not on file   ??? Years of education: Not on file   ??? Highest education level: Not on file   Occupational History   ??? Not on file   Social Needs   ??? Financial resource strain: Not on file   ??? Food insecurity:     Worry: Not on file     Inability: Not on file   ??? Transportation needs:     Medical: Not on file     Non-medical: Not on file   Tobacco Use   ??? Smoking status: Former Smoker     Packs/day: 0.50     Years: 47.00     Pack years: 23.50     Last attempt to quit: 06/2016     Years since quitting: 2.1   ??? Smokeless tobacco: Never Used   Substance and Sexual Activity   ??? Alcohol use: No   ??? Drug use: No   ??? Sexual activity: Not on file   Lifestyle   ??? Physical activity:     Days per week: Not on file     Minutes per session: Not on file   ??? Stress: Not on file   Relationships   ??? Social connections:     Talks on phone: Not on file     Gets together: Not on file     Attends religious service: Not on file     Active member of club or organization: Not on file     Attends meetings of clubs or organizations: Not on file     Relationship status: Not on file   ??? Intimate partner violence:      Fear of current or ex partner: Not on file     Emotionally abused: Not on file     Physically abused: Not on file     Forced sexual activity: Not on file   Other Topics Concern   ??? Not on file   Social History Narrative   ??? Not on file       Allergies: No Known Allergies    Current Medications:  Current Facility-Administered Medications   Medication Dose Route Frequency   ??? 0.9% sodium chloride infusion  50 mL/hr IntraVENous CONTINUOUS   ???  heparin 25,000 units in D5W 250 ml infusion  18-36 Units/kg/hr IntraVENous TITRATE   ??? predniSONE (DELTASONE) tablet 5 mg  5 mg Oral DAILY   ??? senna (SENOKOT) tablet 8.6 mg  1 Tab Oral QHS   ??? sodium chloride (NS) flush 5-40 mL  5-40 mL IntraVENous Q8H   ??? sodium chloride (NS) flush 5-40 mL  5-40 mL IntraVENous PRN   ??? LORazepam (ATIVAN) tablet 0.5 mg  0.5 mg Oral BID PRN   ??? oxyCODONE IR (ROXICODONE) tablet 5-10 mg  5-10 mg Oral Q4H PRN   ??? ondansetron (ZOFRAN) injection 4 mg  4 mg IntraVENous Q4H PRN        Physical Exam:  Blood pressure (!) 169/106, pulse (!) 111, temperature 98.5 ??F (36.9 ??C), resp. rate 17, weight 68.4 kg (150 lb 12.7 oz), SpO2 100 %.  LUNG: clear to auscultation bilaterally, HEART: regular rate and rhythm, S1, S2 normal, no murmur, click, rub or gallop      Alerts:    Hospital Problems  Date Reviewed: 09-25-18          Codes Class Noted POA    * (Principal) AKI (acute kidney injury) ([place]) ICD-10-CM: N17.9  ICD-9-CM: 584.9  09/25/18 Unknown              Laboratory:      Recent Labs     08/27/18  0355 25-Sep-2018  1425   HGB 8.1* 7.3*   HCT 25.0* 22.7*   WBC 5.3 5.5   PLT 263 219   INR  --  1.2*   BUN 80* 79*   CREA 8.96* 8.52*   K 4.1 3.9         Plan of Care/Planned Procedure:  Risks, benefits, and alternatives reviewed with patient and he agrees to proceed with the procedure. Conscious sedation will be performed with IV fentanyl and versed. Plan is for bilateral neph tube placement       Arther Abbott, MD

## 2018-08-27 NOTE — Telephone Encounter (Signed)
Called [name]'s cell number provided, call would not go through.    Called [name]'s home phone which went to VM. VM full so unable to leave VM.

## 2018-08-27 NOTE — Other (Signed)
Bedside and Verbal shift change report given to Kaelyn (Soil scientist) by Cherylynn Ridges (offgoing nurse). Report included the following information SBAR, Kardex, Intake/Output, Recent Results and Cardiac Rhythm NSR.

## 2018-08-27 NOTE — Other (Addendum)
1100:  Bladder scan with 35 mL urine in bladder. Bruce Clark with GU voiced to keep Foley in.    1120:  PTT first theratpuetic.  Next PTT at 1610.    1610:  Unable to draw PTT as heparin turn off for procedure.   Will resume rate when patient return and check PTT 6 hours after infusion.

## 2018-08-27 NOTE — Consults (Addendum)
[id number]- consult dictated    A:  AKI- due to obstructive uropathy. UA is bland. No other cause to explain AKI. Basln Cr normal in Nov 2019  Hyponatremia  Obstructive Uropathy- likely due to prostate cancer  Metastatic Prostate and Lung Cancer  Liver mets  HTN    P:  Urology eval noted  They have consulted IR for PCNT. Expect AKI to improve PCNT  No urgent indication for HD  Daily labs  IOs  Foley  Avoid nephrotoxins    Lindaann Pascal, MD  [place]

## 2018-08-27 NOTE — Progress Notes (Addendum)
[facility]                                                                                          Hospitalist Progress Note  Bruce Math, MD  Answering service: [phone] OR [phone] from in house phone        Date of Service:  08/27/2018  NAME:  Bruce Clark  DOB:  25-Feb-1951  MRN:  [id number]      Admission Summary:   Admitted weak and debilitated for eval insetting of lung cancer and prostate cancer     Interval history / Subjective:       08/27/2018 :  For bil PERC's today by IR  Pt w minimal pain  Cr cont up, K ok, rest lab stable overnight  Case discussed with hem/onc     Assessment & Plan:     ?? Obst uropathy ( 800 cc's post void residual w foley left in place), urology to follow CT abd/pelvis  on order  - for bil PERC's   ?? AKI - for nephrology input and avoiding nephrotoxic's and close following  Cr  Yet up  ?? DVT - for heparin drip, (failed Eliquis taken up to admission) heparin to be cont   ?? Anemia for cont monitoring and f/u hematopoetics   ?? Elevated BNP  ?? Prostate cancer on chemo followed by Dr Bruce Clark   ?? H/o stage 3 lung cancer post resection and rxt followed by Dr. Melony Clark   ?? DNR status (confirmed with pt on admissions)   ?? Low wt status  ?? Ambulation difficulties   Code status: DNR   DVT prophylaxis: on hep drip between proceedures     Care Plan discussed with: Patient/Family and Nurse consultants 9 hem Bruce Clark   Disposition: TBD     Hospital Problems  Date Reviewed: 09-10-18          Codes Class Noted POA    * (Principal) AKI (acute kidney injury) ([place]) ICD-10-CM: N17.9  ICD-9-CM: 584.9  09-10-18 Unknown                Review of Systems:   Pertinent items are noted in HPI.       Vital Signs:    Last 24hrs VS reviewed since prior progress note. Most recent are:  Visit Vitals  BP 161/84 (BP 1 Location: Right arm, BP Patient Position: Head of bed elevated (Comment degrees))   Pulse 90   Temp 98.5 ??F (36.9 ??C)   Resp 17   Wt 68.4 kg (150 lb 12.7 oz)   SpO2 99%    BMI 23.62 kg/m??         Intake/Output Summary (Last 24 hours) at 08/27/2018 1542  Last data filed at 08/27/2018 1314  Gross per 24 hour   Intake 1189.45 ml   Output 385 ml   Net 804.45 ml        Physical Examination:             Constitutional:  No acute distress, weak, debilitated, and  Emaciated, appearing. Else cooperative, pleasant    ENT:  Oral mucous moist, oropharynx benign.    Resp:  CTA bilaterally. No wheezing/rhonchi/rales. No accessory muscle use   CV:  Regular rhythm, normal rate, no  gallops, rubs    GI:  Soft, non distended, non tender. normoactive bowel sounds, no hepatosplenomegaly     Musculoskeletal:  rt edema > lt  edema, warm,      Neurologic:  Moves all extremities.  AAOx3, CN II-XII reviewed     Psych:  Good insight, Not anxious nor agitated.  Skin:  Good turgor, no rashes or ulcers       Data Review:    Review and/or order of clinical lab test      Labs:     Recent Labs     08/27/18  0355 08/26/18  1425   WBC 5.3 5.5   HGB 8.1* 7.3*   HCT 25.0* 22.7*   PLT 263 219     Recent Labs     08/27/18  0355 08/26/18  1425 08/26/18  1141   NA 130* 133* 132*   K 4.1 3.9 4.6   CL 95* 96* 96*   CO2 25 28 27    BUN 80* 79* 81*   CREA 8.96* 8.52* 8.71*   GLU 109* 94 97   CA 9.2 8.7 9.2     Recent Labs     08/26/18  1141   SGOT 26   ALT 9*   AP 88   TBILI 0.3   TP 8.2   ALB 2.8*   GLOB 5.4*     Recent Labs     08/27/18  0954 08/27/18  0226 08/26/18  2315 08/26/18  1425   INR  --   --   --  1.2*   PTP  --   --   --  11.8*   APTT 58.1* 51.2* >130.0* 36.3*      No results for input(s): FE, TIBC, PSAT, FERR in the last 72 hours.   No results found for: FOL, RBCF   No results for input(s): PH, PCO2, PO2 in the last 72 hours.  Recent Labs     08/26/18  1425   TROIQ <0.05     Lab Results   Component Value Date/Time    Cholesterol, total 184 07/01/2016 10:50 PM    HDL Cholesterol 64 07/01/2016 10:50 PM    LDL, calculated 109.2 (H) 07/01/2016 10:50 PM    Triglyceride 54 07/01/2016 10:50 PM     CHOL/HDL Ratio 2.9 07/01/2016 10:50 PM     Lab Results   Component Value Date/Time    Glucose (POC) 103 (H) 04/28/2016 10:31 AM    Glucose (POC) 90 04/26/2016 10:33 PM    Glucose (POC) 71 04/26/2016 05:12 PM    Glucose (POC) 106 (H) 04/26/2016 12:03 PM    Glucose (POC) 117 (H) 04/25/2016 04:52 PM     Lab Results   Component Value Date/Time    Color YELLOW/STRAW 08/26/2018 02:25 PM    Appearance CLEAR 08/26/2018 02:25 PM    Specific gravity 1.012 08/26/2018 02:25 PM    Specific gravity 1.025 04/22/2016 08:22 PM    pH (UA) 5.0 08/26/2018 02:25 PM    Protein NEGATIVE  08/26/2018 02:25 PM    Glucose NEGATIVE  08/26/2018 02:25 PM    Ketone NEGATIVE  08/26/2018 02:25 PM    Bilirubin NEGATIVE  08/26/2018 02:25 PM    Urobilinogen 0.2 08/26/2018 02:25 PM    Nitrites NEGATIVE  08/26/2018 02:25 PM    Leukocyte Esterase NEGATIVE  08/26/2018 02:25 PM    Epithelial cells FEW 08/26/2018 02:25 PM    Bacteria NEGATIVE  08/26/2018 02:25 PM    WBC 0-4 08/26/2018 02:25 PM    RBC 0-5 08/26/2018 02:25 PM         Medications Reviewed:     Current Facility-Administered Medications   Medication Dose Route Frequency   ??? lidocaine (XYLOCAINE) 20 mg/mL (2 %) injection 400 mg  20 mL SubCUTAneous ONCE   ??? iopamidol (ISOVUE 300) 61 % contrast injection 100 mL  100 mL IntraVENous RAD ONCE   ??? ceFAZolin (ANCEF) 2 g/20 mL in sterile water IV syringe  2 g IntraVENous ONCE   ??? 0.9% sodium chloride infusion  50 mL/hr IntraVENous CONTINUOUS   ??? fentaNYL citrate (PF) injection 200 mcg  200 mcg IntraVENous Rad Multiple   ??? midazolam (VERSED) injection 5 mg  5 mg IntraVENous Rad Multiple   ??? heparin 25,000 units in D5W 250 ml infusion  18-36 Units/kg/hr IntraVENous TITRATE   ??? predniSONE (DELTASONE) tablet 5 mg  5 mg Oral DAILY   ??? senna (SENOKOT) tablet 8.6 mg  1 Tab Oral QHS   ??? sodium chloride (NS) flush 5-40 mL  5-40 mL IntraVENous Q8H   ??? sodium chloride (NS) flush 5-40 mL  5-40 mL IntraVENous PRN    ??? LORazepam (ATIVAN) tablet 0.5 mg  0.5 mg Oral BID PRN   ??? oxyCODONE IR (ROXICODONE) tablet 5-10 mg  5-10 mg Oral Q4H PRN   ??? ondansetron (ZOFRAN) injection 4 mg  4 mg IntraVENous Q4H PRN     ______________________________________________________________________  EXPECTED LENGTH OF STAY: 3d 4h  ACTUAL LENGTH OF STAY:          1                 Bruce Math, MD

## 2018-08-27 NOTE — Other (Signed)
TRANSFER - IN REPORT:    Verbal report received from [place] (name) on DEDRIC ETHINGTON  being received from [facility] (unit) for ordered procedure      Report consisted of patient???s Situation, Background, Assessment and   Recommendations(SBAR).     Information from the following report(s) SBAR, Procedure Summary, Intake/Output, MAR and Recent Results was reviewed with the receiving nurse.    Opportunity for questions and clarification was provided.      Assessment completed upon patient???s arrival to unit and care assumed.     Sharyn Lull reports to resume heparin gtt per hospitalist.

## 2018-08-27 NOTE — Consults (Signed)
[id number]- consult dictated    A:  AKI- due to obstructive uropathy. UA is bland. No other cause to explain AKI. Basln Cr normal in Nov 2019  Hyponatremia  Obstructive Uropathy- likely due to prostate cancer  Metastatic Prostate and Lung Cancer  Liver mets  HTN    P:  Urology eval noted  They have consulted IR for PCNT. Expect AKI to improve PCNT  No urgent indication for HD  Daily labs  IOs  Foley  Avoid nephrotoxins    Lindaann Pascal, MD  [place]

## 2018-08-27 NOTE — Progress Notes (Signed)
1218  Received a critical PTT (130) from lab. Heparin stopped. Paged hospitalist.   [phone]  Spoke with hospitalist ordered to follow the order set and monitor for bleeding. Marland Kitchen  5176  Restarted heparin. Decreased to 15 Units/Kg/Hour.

## 2018-08-27 NOTE — Progress Notes (Signed)
Reason for Admission:   AKI, obstructive uropathy                  RRAT Score:     16             Do you (patient/family) have any concerns for transition/discharge?   none                Plan for utilizing home health:   TBD     Current Advanced Directive/Advance Care Plan:  On file- his caregivers are his MPOA's( 1st [name] 2nd - [name])            Transition of Care Plan:        Home with PCP and urology/specialty follow-up and possible skilled home health services    Cm met with patient - he lives with his two friends - is independent with ADL's and onlyu occasionally uses a cane - has Medicare A, B and D and no other insurance.  Has no concerns regarding transition to home.  Friends will transport to home. [name], MSW    Care Management Interventions  PCP Verified by CM: Yes  MyChart Signup: No  Discharge Durable Medical Equipment: No  Physical Therapy Consult: No  Occupational Therapy Consult: No  Speech Therapy Consult: No  Current Support Network: Lives with Caregiver  Confirm Follow Up Transport: Friends  Discharge Location  Discharge Placement: Home with home health(TBD)

## 2018-08-27 NOTE — Progress Notes (Signed)
Progress  Notes by Emilio Math, MD at 08/27/18 1542                Author: Emilio Math, MD  Service: --  Author Type: Physician       Filed: 08/27/18 1547  Date of Service: 08/27/18 1542  Status: Addendum          Editor: Emilio Math, MD (Physician)          Related Notes: Original Note by Emilio Math, MD (Physician) filed at 08/27/18 [facility]                                                                                              Hospitalist Progress Note   Emilio Math, MD   Answering service: [phone] OR [phone] from in house phone            Date of Service:  08/27/2018   NAME:  Bruce Clark   DOB:  06-Jun-1951   MRN:  [id number]           Admission Summary:        Admitted weak and debilitated for eval insetting of lung cancer and prostate cancer       Interval history / Subjective:             08/27/2018 :   For bil PERC's today by IR   Pt w minimal pain   Cr cont up, K ok, rest lab stable overnight   Case discussed with hem/onc          Assessment & Plan:        ??    Obst uropathy ( 800 cc's post void residual w foley left in place), urology to follow CT abd/pelvis  on order  - for bil PERC's    ??  AKI - for nephrology input and avoiding nephrotoxic's and close following  Cr  Yet up   ??  DVT - for heparin drip, (failed Eliquis taken up to admission) heparin to be cont    ??  Anemia for cont monitoring and f/u hematopoetics    ??  Elevated BNP   ??  Prostate cancer on chemo followed by Dr Melony Overly    ??  H/o stage 3 lung cancer post resection and rxt followed by Dr. Melony Overly    ??  DNR status (confirmed with pt on admissions)    ??  Low wt status   ??  Ambulation difficulties    Code status: DNR    DVT prophylaxis: on hep drip between [place] discussed with: Patient/Family and Nurse consultants 9 hem [name]    Disposition: TBD           Hospital Problems   Date Reviewed:  08/26/2018                         Codes  Class   Noted  POA              * (Principal) AKI (acute kidney injury) [facility])  ICD-10-CM: N17.9   ICD-9-CM: 584.9    08/26/2018  Unknown                               Review of Systems:     Pertinent items are noted in HPI.            Vital Signs:      Last 24hrs VS reviewed since prior progress note. Most recent are:   Visit Vitals      BP  161/84 (BP 1 Location: Right arm, BP Patient Position: Head of bed elevated (Comment degrees))     Pulse  90     Temp  98.5 ??F (36.9 ??C)     Resp  17     Wt  68.4 kg (150 lb 12.7 oz)     SpO2  99%        BMI  23.62 kg/m??              Intake/Output Summary (Last 24 hours) at 08/27/2018 1542   Last data filed at 08/27/2018 1314     Gross per 24 hour        Intake  1189.45 ml        Output  385 ml        Net  804.45 ml              Physical Examination:                     Constitutional:   No acute distress, weak, debilitated, and  Emaciated, appearing. Else cooperative, pleasant      ENT:   Oral mucous moist, oropharynx benign.      Resp:   CTA bilaterally. No wheezing/rhonchi/rales. No accessory muscle use     CV:   Regular rhythm, normal rate, no  gallops, rubs      GI:   Soft, non distended, non tender. normoactive bowel sounds, no hepatosplenomegaly       Musculoskeletal:   rt edema > lt  edema, warm,           Neurologic:   Moves all extremities.  AAOx3, CN II-XII reviewed       Psych:  Good insight, Not anxious nor agitated.   Skin:  Good turgor, no rashes or ulcers               Data Review:      Review and/or order of clinical lab test           Labs:          Recent Labs            08/27/18   0355  08/26/18   1425     WBC  5.3  5.5     HGB  8.1*  7.3*     HCT  25.0*  22.7*         PLT  263  219          Recent Labs             08/27/18   0355  08/26/18   1425  08/26/18   1141     NA  130*  133*  132*     K  4.1  3.9  4.6     CL  95*  96*  96*     CO2  25  28  27      BUN  80*  79*  81*     CREA  8.96*  8.52*  8.71*     GLU  109*  94  97          CA  9.2  8.7  9.2          Recent Labs            08/26/18   1141     SGOT  26     ALT  9*     AP  88     TBILI  0.3     TP  8.2     ALB  2.8*        GLOB  5.4*          Recent Labs              08/27/18   0954  08/27/18   0226  08/26/18   2315  08/26/18   1425     INR   --    --    --   1.2*     PTP   --    --    --   11.8*           APTT  58.1*  51.2*  >130.0*  36.3*         No results for input(s): FE, TIBC, PSAT, FERR in the last 72 hours.    No results found for: FOL, RBCF    No results for input(s): PH, PCO2, PO2 in the last 72 hours.     Recent Labs           08/26/18   1425        TROIQ  <0.05          Lab Results         Component  Value  Date/Time            Cholesterol, total  184  07/01/2016 10:50 PM       HDL Cholesterol  64  07/01/2016 10:50 PM       LDL, calculated  109.2 (H)  07/01/2016 10:50 PM       Triglyceride  54  07/01/2016 10:50 PM            CHOL/HDL Ratio  2.9  07/01/2016 10:50 PM          Lab Results         Component  Value  Date/Time            Glucose (POC)  103 (H)  04/28/2016 10:31 AM       Glucose (POC)  90  04/26/2016 10:33 PM       Glucose (POC)  71  04/26/2016 05:12 PM       Glucose (POC)  106 (H)  04/26/2016 12:03 PM            Glucose (POC)  117 (H)  04/25/2016 04:52 PM          Lab Results         Component  Value  Date/Time            Color  YELLOW/STRAW  08/26/2018 02:25 PM       Appearance  CLEAR  08/26/2018 02:25 PM       Specific  gravity  1.012  08/26/2018 02:25 PM       Specific gravity  1.025  04/22/2016 08:22 PM       pH (UA)  5.0  08/26/2018 02:25 PM       Protein  NEGATIVE   08/26/2018 02:25 PM       Glucose  NEGATIVE   08/26/2018 02:25 PM       Ketone  NEGATIVE   08/26/2018 02:25 PM       Bilirubin  NEGATIVE   08/26/2018 02:25 PM       Urobilinogen  0.2  08/26/2018 02:25 PM       Nitrites  NEGATIVE   08/26/2018 02:25 PM       Leukocyte Esterase  NEGATIVE   08/26/2018 02:25 PM       Epithelial cells  FEW  08/26/2018 02:25 PM       Bacteria  NEGATIVE   08/26/2018 02:25 PM       WBC  0-4  08/26/2018 02:25 PM             RBC  0-5  08/26/2018 02:25 PM                Medications Reviewed:          Current Facility-Administered Medications          Medication  Dose  Route  Frequency           ?  lidocaine (XYLOCAINE) 20 mg/mL (2 %) injection 400 mg   20 mL  SubCUTAneous  ONCE     ?  iopamidol (ISOVUE 300) 61 % contrast injection 100 mL   100 mL  IntraVENous  RAD ONCE           ?  ceFAZolin (ANCEF) 2 g/20 mL in sterile water IV syringe   2 g  IntraVENous  ONCE           ?  0.9% sodium chloride infusion   50 mL/hr  IntraVENous  CONTINUOUS     ?  fentaNYL citrate (PF) injection 200 mcg   200 mcg  IntraVENous  Rad Multiple     ?  midazolam (VERSED) injection 5 mg   5 mg  IntraVENous  Rad Multiple     ?  heparin 25,000 units in D5W 250 ml infusion   18-36 Units/kg/hr  IntraVENous  TITRATE     ?  predniSONE (DELTASONE) tablet 5 mg   5 mg  Oral  DAILY     ?  senna (SENOKOT) tablet 8.6 mg   1 Tab  Oral  QHS     ?  sodium chloride (NS) flush 5-40 mL   5-40 mL  IntraVENous  Q8H     ?  sodium chloride (NS) flush 5-40 mL   5-40 mL  IntraVENous  PRN     ?  LORazepam (ATIVAN) tablet 0.5 mg   0.5 mg  Oral  BID PRN     ?  oxyCODONE IR (ROXICODONE) tablet 5-10 mg   5-10 mg  Oral  Q4H PRN           ?  ondansetron (ZOFRAN) injection 4 mg   4 mg  IntraVENous  Q4H PRN        ______________________________________________________________________   EXPECTED LENGTH OF STAY: 3d 4h   ACTUAL LENGTH OF STAY:          1  Emilio Math, MD

## 2018-08-27 NOTE — Consults (Signed)
Consults  by Neale Burly, NP at 08/27/18 1001                Author: Neale Burly, NP  Service: Nurse Practitioner  Author Type: Nurse Practitioner       Filed: 08/27/18 1439  Date of Service: 08/27/18 1001  Status: Addendum          Editor: Neale Burly, NP (Nurse Practitioner)       Related Notes: Original Note by Neale Burly, NP (Nurse Practitioner) filed at 08/27/18  1025          Cosigner: Benita Gutter., MD at 09/06/18 [phone]            Consult Orders        1. IP CONSULT TO UROLOGY [[id number]] ordered by Emilio Math, MD at 08/26/18 1557                              Requesting Provider: Emilio Math, MD - Reason for Consultation:  "urinary retention"   Pre-existing [facility] Patient:   No                          Patient: Bruce Clark  MRN: [id number]   SSN: [id number]          Date of Birth: 03-23-1951   Age: 67 y.o.   Sex: male           Location: 448/01             Code Status:  DNR        PCP:  Juliene Pina, MD  - [phone]        Emergency Contact:   Primary Emergency Contact: Simmons,[place], Home Phone: [phone]     Race/Religion/Language:  WHITE OR CAUCASIAN / NO PREFERENCE / Speaks ENGLISH     Payor:  Payor: VA MEDICARE / Plan: VA MEDICARE PART A & B / Product Type: Medicare /      Prior Admission Data:  05/25/17 [place] 4W TELEMETRY     [name]     Hospitalized:   Hospital Day: 2 - Admitted 08/26/2018  1:21 PM        POD #  * No surgery found *  by * Surgery not found * - Blood Loss: * No surgery found * * Surgery not found *           CONSULTANTS   IP CONSULT TO HOSPITALIST   IP CONSULT TO UROLOGY   IP CONSULT TO [place]       ICD-10-CM  ICD-9-CM      1.  AKI (acute kidney injury) ([place])  N17.9  584.9      2.  Lower extremity edema  R60.0  782.3      3.  Urinary retention  R33.9  788.20      4.  Acute deep vein thrombosis (DVT) of femoral vein of right lower extremity (HCC)  I82.411  453.41      5.  Bone  metastasis (HCC)  C79.51  198.5      6.  Malignant neoplasm of lower lobe of right lung (HCC)  C34.31  162.5      7.  Metastasis to bone of unknown primary (HCC)  C79.51  198.5        C80.1  199.1  8.  Prostate cancer ([place])  C61  185                    Assessment/Plan:             Urinary Retention   AKI   - consult to IR. At this time we recommend bilateral percutaneous nephrostomy tube placement through IR.   - please obtain bladder scan and document in nursing note   - strict I&O documentation please              CC:  Referral / Consult        HPI:  He is a 68 y.o. male that was admitted with progressing malignancy of prostate and lung cancer, followed by Dr. Melony Overly, obstructive uropathy, AKI, DVT- on  heparin drip, anemia, and elevated BNP. This patient is DNR. The patient is currently on ADT and Zytiga for prostate cancer treatment.      [facility] was consulted d/t urinary retention. Foley catheter inserted yesterday. No change in Creatinine pre and post foley insertion. Per RN, yesterday the patient had 800 cc PVR with foley in place. Low UOP, roughly ~20 cc output today and 350  ml yesterday. CT shows foley catheter within urinary bladder.      afvss   Cr: 8.96   BUN: 80   Hgb: 8.1   WBC: wnl   UA: clear   Foley in place draining clear yellow UA.      CT abd/pelvis wo: 1. Unchanged appearance of right hemithorax as above.   2. Increased number and size of hypoattenuating lesions of the liver consistent   with worsening hepatic metastatic disease.   3. Bilateral renal pelvocaliectasis and ureteral distention, new on the right   and increased on the left.   4. Foley catheter within urinary bladder which cannot be further assessed.   5. Osteosclerotic metastatic disease.   6. Anasarca appearance demonstrated in the interval.                 Temp (24hrs), Avg:98.1 ??F (36.7 ??C), Min:97.4 ??F (36.3 ??C), Max:98.8 ??F (37.1 ??C)      Urinary Status: Foley   Creatinine      Date/Time  Value  Ref Range  Status       08/27/2018 03:55 AM  8.96 (H)  0.70 - 1.30 MG/DL  Final      08/26/2018 02:25 PM  8.52 (H)  0.70 - 1.30 MG/DL  Final      08/26/2018 11:41 AM  8.71 (H)  0.70 - 1.30 MG/DL  Final      07/05/2018 08:50 AM  0.74  0.70 - 1.30 MG/DL  Final      05/11/2018 09:59 AM  0.78  0.76 - 1.27 mg/dL  Final            Current Antimicrobial Therapy  (168h ago, onward)        None                  Key Anti-Platelet Anticoagulant Meds                       apixaban (ELIQUIS) 5 mg tablet  (Taking)  Take 1 Tab by mouth two (2) times a day.                     Diet: DIET CARDIAC Regular   DIET ONE TIME MESSAGE -  Labs           Lab Results      Component  Value  Date/Time        WBC  5.3  08/27/2018 03:55 AM        HCT  25.0 (L)  08/27/2018 03:55 AM        PLATELET  263  08/27/2018 03:55 AM        Sodium  130 (L)  08/27/2018 03:55 AM        Potassium  4.1  08/27/2018 03:55 AM        Chloride  95 (L)  08/27/2018 03:55 AM        CO2  25  08/27/2018 03:55 AM        BUN  80 (H)  08/27/2018 03:55 AM        Creatinine  8.96 (H)  08/27/2018 03:55 AM        Glucose  109 (H)  08/27/2018 03:55 AM        Calcium  9.2  08/27/2018 03:55 AM        Magnesium  1.6  04/30/2016 03:46 AM        INR  1.2 (H)  08/26/2018 02:25 PM        Prostate Specific Ag  0.1  08/26/2018 11:41 AM           UA:    Lab Results      Component  Value  Date/Time        Color  YELLOW/STRAW  08/26/2018 02:25 PM        Appearance  CLEAR  08/26/2018 02:25 PM        Specific gravity  1.012  08/26/2018 02:25 PM        Specific gravity  1.025  04/22/2016 08:22 PM        pH (UA)  5.0  08/26/2018 02:25 PM        Protein  NEGATIVE   08/26/2018 02:25 PM        Glucose  NEGATIVE   08/26/2018 02:25 PM        Ketone  NEGATIVE   08/26/2018 02:25 PM        Bilirubin  NEGATIVE   08/26/2018 02:25 PM        Urobilinogen  0.2  08/26/2018 02:25 PM        Nitrites  NEGATIVE   08/26/2018 02:25 PM        Leukocyte Esterase  NEGATIVE   08/26/2018 02:25 PM        Epithelial cells  FEW   08/26/2018 02:25 PM        Bacteria  NEGATIVE   08/26/2018 02:25 PM        WBC  0-4  08/26/2018 02:25 PM        RBC  0-5  08/26/2018 02:25 PM           Imaging           Results for orders placed during the hospital encounter of 08/26/18      CT ABD PELV WO CONT        Narrative  EXAM: CT ABD PELV WO CONT      INDICATION: sob hx cancer      COMPARISON: CT 07/13/2018      CONTRAST:  0 mL of Isovue-370.      TECHNIQUE:    Following the uneventful intravenous administration of contrast, thin axial   images were obtained through the  chest, abdomen and pelvis. Coronal and sagittal   reconstructions were generated. Oral contrast was not administered. CT dose   reduction was achieved through use of a standardized protocol tailored for this   examination and automatic exposure control for dose modulation.      The lack of oral and IV contrast material substantially diminishes the capacity   of CT to and solid organs.      FINDINGS:       THYROID: No nodule.   MEDIASTINUM: No mass or lymphadenopathy.   HILA: No mass or lymphadenopathy.   THORACIC AORTA: No dissection or aneurysm. Atherosclerotic calcifications.   MAIN PULMONARY ARTERY: Normal in caliber.   TRACHEA/BRONCHI: Patent.   ESOPHAGUS: No wall thickening or dilatation.   HEART: Atherosclerotic calcifications. Normal cardiac size.   PLEURA: Mild pleural thickening at the posterior and lateral mid and lower right   hemithorax.   LUNGS: Diminished right hemithoracic volume again shown. Inferior perihilar   pulmonary densities including dense opacification extending posteriorly to the   pleural margin with bronchiectasis. Appearance unchanged.   LIVER: Hypoattenuating lesions. Previously contrast enhanced study, a 14 mm   lesion is shown in segment 7. On this unenhanced study, the lesion now measures   up to 28 mm. There is also now demonstrated a medial segment left lobe lesion   measuring 18 mm in size.   GALLBLADDER: Unremarkable.   SPLEEN: No mass.   PANCREAS: No  mass or ductal dilatation.   ADRENALS: Unremarkable.   KIDNEYS: Moderate left renal pelvocaliectasis and diffuse ureteral dilation   slightly worsened in the interval. Interval demonstration of moderate right   renal pelvocaliectasis and proximal ureteral distention.   STOMACH: Unremarkable.   SMALL BOWEL: No dilatation or wall thickening.   COLON: No dilatation or wall thickening.   APPENDIX: Not shown.   PERITONEUM: No ascites or pneumoperitoneum.   RETROPERITONEUM: No lymphadenopathy or aortic aneurysm.   REPRODUCTIVE ORGANS: Prostate gland enlargement again shown.   URINARY BLADDER: Cannot be assessed. There is a Foley catheter within urinary   bladder with small amount of nondependent air and collapsed appearance.   BONES: Osteosclerotic metastatic disease again shown with similar distribution   to that shown previously.   ADDITIONAL COMMENTS: Interval anasarca appearance with diffuse abdominal and   pelvic subcutaneous edema.           Impression  IMPRESSION:      1. Unchanged appearance of right hemithorax as above.   2. Increased number and size of hypoattenuating lesions of the liver consistent   with worsening hepatic metastatic disease.   3. Bilateral renal pelvocaliectasis and ureteral distention, new on the right   and increased on the left.   4. Foley catheter within urinary bladder which cannot be further assessed.   5. Osteosclerotic metastatic disease.   6. Anasarca appearance demonstrated in the interval.                  [place] Results (most recent):   Results from Hospital Encounter encounter on 08/17/18      US GUIDE BX LIV PERC        Narrative  INDICATION: Lung cancer, prostate cancer. New liver lesion.      The risks, benefits and alternatives of ultrasound-guided liver biopsy were   discussed with the patient. After all questions were answered and informed   written consent was obtained, the patient was prepped and draped in the standard   sterile fashion. 1% lidocaine was used  as local  anesthetic. Fentanyl and Versed   were administered for conscious sedation. Using ultrasound guidance and an   18-gauge, 15 cm Temno needle, 1.9 cm right hepatic dome lesion was biopsied. 4   core specimens were obtained. There is no complication. There is no significant   blood loss. 100 mcg of fentanyl and 2 mg of Versed and were administered   intravenously for conscious sedation which was supervised by Dr. Randon Goldsmith.   Procedure time was 24 minutes. After observation arthrogram, the patient was   discharged in stable condition with specific postprocedure instructions.           Impression  IMPRESSION: Ultrasound-guided biopsy of 1.9 cm right hepatic dome lesion. No   complication.              Cultures           All Micro Results         Procedure  Component  Value  Units  Date/Time        URINE CULTURE HOLD SAMPLE [[id number]]  Collected:  08/26/18 1425        Order Status:  Completed  Specimen:  Urine from Serum  Updated:  08/26/18 1439          Urine culture hold              URINE ON HOLD IN MICROBIOLOGY DEPT FOR 3 DAYS. IF UNPRESERVED URINE IS SUBMITTED, IT CANNOT BE USED FOR ADDITIONAL TESTING AFTER 24  HRS, RECOLLECTION WILL BE REQUIRED.                                     Past History: (Complete 2+/3 areas)     No Known Allergies    Current Facility-Administered Medications      Medication  Dose  Route  Frequency      ?  influenza vaccine 2019-20 (6 mos+)(PF) (FLUARIX/FLULAVAL/FLUZONE QUAD) injection 0.5 mL   0.5 mL  IntraMUSCular  PRIOR TO DISCHARGE      ?  heparin 25,000 units in D5W 250 ml infusion   18-36 Units/kg/hr  IntraVENous  TITRATE      ?  predniSONE (DELTASONE) tablet 5 mg   5 mg  Oral  DAILY      ?  senna (SENOKOT) tablet 8.6 mg   1 Tab  Oral  QHS      ?  sodium chloride (NS) flush 5-40 mL   5-40 mL  IntraVENous  Q8H      ?  sodium chloride (NS) flush 5-40 mL   5-40 mL  IntraVENous  PRN      ?  LORazepam (ATIVAN) tablet 0.5 mg   0.5 mg  Oral  BID PRN      ?  oxyCODONE IR (ROXICODONE) tablet 5-10  mg   5-10 mg  Oral  Q4H PRN      ?  ondansetron (ZOFRAN) injection 4 mg   4 mg  IntraVENous  Q4H PRN           Prior to Admission medications       Medication  Sig  Start Date  End Date  Taking?  Authorizing Provider      oxyCODONE-acetaminophen (PERCOCET) 5-325 mg per tablet  Take 1 Tab by mouth every four (4) hours as needed for Pain.      Yes  Provider, Historical      apixaban (  ELIQUIS) 5 mg tablet  Take 1 Tab by mouth two (2) times a day.  06/23/18    Yes  Agee, Sarah, NP      predniSONE (DELTASONE) 5 mg tablet  Take 1 Tab by mouth daily. Restart on Jun 01, 2017  01/21/18    Yes  Mare Loan, NP      abiraterone (ZYTIGA) 500 mg tab  Take 1,000 mg by mouth daily.  10/02/17    Yes  Mare Loan, NP                 PMHx:  has a past medical history of Lung cancer [facility]), Prostate cancer ([place]), and Stroke ([place]).    PSurgHx:  has a past surgical history that includes hx gi; hx orthopaedic (1994); and pr chest surgery procedure unlisted (04/27/2017).   PSocHx:  reports that he quit smoking about 2 years ago. He has a 23.50 pack-year smoking history. He has never used smokeless tobacco.  He reports that he does not drink alcohol or use drugs.    ROS:  (Complete - 10 systems)  - DENIES: Weightloss  (Constitutional), Dry mouth (ENMT), Chest pain (CV), SOB (Respiratory), Constipation (GI), Weakness (MS), Pallor (Skin), TIA Sx (Neuro), Confusion (Psych), Easy bruising (Heme)        Physical Exam: (Comprehesive - 8+ 1995 Systems)             (1)  Constitutional:    FIO2:   on SpO2: O2 Sat (%): 98 %   O2 Device: Room air    Patient Vitals for the past 24 hrs:     BP  Temp  Pulse  Resp  SpO2  Weight      08/27/18 0940  --  --  86  --  --  --      08/27/18 0927  (!) 154/92  98 ??F (36.7 ??C)  87  16  98 %  --      08/27/18 0341  121/87  98.1 ??F (36.7 ??C)  92  16  98 %  --      08/27/18 0234  --  --  --  --  --  68.4 kg (150 lb 12.7 oz)      08/26/18 2306  134/83  98 ??F (36.7 ??C)  83  15  99 %  --      08/26/18 1924  (!) 174/96  98.1  ??F (36.7 ??C)  82  16  100 %  --      08/26/18 1908  (!) 189/94  98.8 ??F (37.1 ??C)  80  18  100 %  65.3 kg (143 lb 15.4 oz)      08/26/18 1730  (!) 149/91  98.4 ??F (36.9 ??C)  82  16  100 %  --      08/26/18 1630  140/79  --  86  22  100 %  --      08/26/18 1439  168/89  --  87  21  100 %  --      08/26/18 1305  128/84  98.1 ??F (36.7 ??C)  82  20  100 %  --                 Date  08/26/18 0700 - 08/27/18 0659  08/27/18 0700 - 08/28/18 0659      Shift  0700-1859  1900-0659  24 Hour Total  0700-1859  9562-1308  24 Hour Total      INTAKE  I.V.  1000    1000(0.6)              Volume (sodium chloride 0.9 % bolus infusion 1,000 mL)  1000    1000            Shift Total(mL/kg)  1000    1000(14.6)            OUTPUT      Urine    350(0.4)  350(0.2)              Urine Output (mL) (Urinary Catheter 08/26/18 2- way;Foley - Temperature)    350  350            Stool                    Stool Occurrence(s)    1 x  1 x            Shift Total(mL/kg)    350(5.1)  350(5.1)            NET  1000  -350  650            Weight (kg)    68.4  68.4  68.4  68.4  68.4                     (2)  ENMT:     moist mucous membranes, normal sinuses          (3)  Respiratory:    breathing easily, no distress          (4)  GI:    no abdominal masses, tenderness          (5)  GU:     Foley in place draining clear yellow urine, minimal UOP          (6)  Lymphatic:    no adenopathy, neck supple     (7)  Muscloskeletal:    no gross deformity, normal ROM     (8)  Skin:    no rash, warm & dry          (9)  Neuro:    Lethargic, alert, oriented, normal speech         Signed By: Neale Burly, NP  - August 27, 2018

## 2018-08-27 NOTE — H&P (Signed)
Interventional and Vascular Radiology History and Physical    Patient: Bruce Clark 68 y.o. male       Chief Complaint: Referral / Consult      History of Present Illness: bilateral urinary obstruction     History:    Past Medical History:   Diagnosis Date   ??? Lung cancer ([place])    ??? Prostate cancer ([place])    ??? Stroke ([place])     PT STATES, "I HAD A STROKE A LONG TIME AGO"     Family History   Problem Relation Age of Onset   ??? Cancer Mother         COLON   ??? Cancer Father         BRAIN TUMOR   ??? No Known Problems Sister    ??? Arthritis-osteo Brother    ??? No Known Problems Sister    ??? No Known Problems Sister    ??? No Known Problems Sister    ??? No Known Problems Brother    ??? Anesth Problems Neg Hx      Social History     Socioeconomic History   ??? Marital status: SINGLE     Spouse name: Not on file   ??? Number of children: Not on file   ??? Years of education: Not on file   ??? Highest education level: Not on file   Occupational History   ??? Not on file   Social Needs   ??? Financial resource strain: Not on file   ??? Food insecurity:     Worry: Not on file     Inability: Not on file   ??? Transportation needs:     Medical: Not on file     Non-medical: Not on file   Tobacco Use   ??? Smoking status: Former Smoker     Packs/day: 0.50     Years: 47.00     Pack years: 23.50     Last attempt to quit: 06/2016     Years since quitting: 2.1   ??? Smokeless tobacco: Never Used   Substance and Sexual Activity   ??? Alcohol use: No   ??? Drug use: No   ??? Sexual activity: Not on file   Lifestyle   ??? Physical activity:     Days per week: Not on file     Minutes per session: Not on file   ??? Stress: Not on file   Relationships   ??? Social connections:     Talks on phone: Not on file     Gets together: Not on file     Attends religious service: Not on file     Active member of club or organization: Not on file     Attends meetings of clubs or organizations: Not on file     Relationship status: Not on file   ??? Intimate partner violence:     Fear of current  or ex partner: Not on file     Emotionally abused: Not on file     Physically abused: Not on file     Forced sexual activity: Not on file   Other Topics Concern   ??? Not on file   Social History Narrative   ??? Not on file       Allergies: No Known Allergies    Current Medications:  Current Facility-Administered Medications   Medication Dose Route Frequency   ??? 0.9% sodium chloride infusion  50 mL/hr IntraVENous CONTINUOUS   ???  heparin 25,000 units in D5W 250 ml infusion  18-36 Units/kg/hr IntraVENous TITRATE   ??? predniSONE (DELTASONE) tablet 5 mg  5 mg Oral DAILY   ??? senna (SENOKOT) tablet 8.6 mg  1 Tab Oral QHS   ??? sodium chloride (NS) flush 5-40 mL  5-40 mL IntraVENous Q8H   ??? sodium chloride (NS) flush 5-40 mL  5-40 mL IntraVENous PRN   ??? LORazepam (ATIVAN) tablet 0.5 mg  0.5 mg Oral BID PRN   ??? oxyCODONE IR (ROXICODONE) tablet 5-10 mg  5-10 mg Oral Q4H PRN   ??? ondansetron (ZOFRAN) injection 4 mg  4 mg IntraVENous Q4H PRN        Physical Exam:  Blood pressure (!) 169/106, pulse (!) 111, temperature 98.5 ??F (36.9 ??C), resp. rate 17, weight 68.4 kg (150 lb 12.7 oz), SpO2 100 %.  LUNG: clear to auscultation bilaterally, HEART: regular rate and rhythm, S1, S2 normal, no murmur, click, rub or gallop      Alerts:    Hospital Problems  Date Reviewed: Sep 08, 2018          Codes Class Noted POA    * (Principal) AKI (acute kidney injury) ([place]) ICD-10-CM: N17.9  ICD-9-CM: 584.9  09-08-2018 Unknown              Laboratory:      Recent Labs     08/27/18  0355 September 08, 2018  1425   HGB 8.1* 7.3*   HCT 25.0* 22.7*   WBC 5.3 5.5   PLT 263 219   INR  --  1.2*   BUN 80* 79*   CREA 8.96* 8.52*   K 4.1 3.9         Plan of Care/Planned Procedure:  Risks, benefits, and alternatives reviewed with patient and he agrees to proceed with the procedure. Conscious sedation will be performed with IV fentanyl and versed. Plan is for bilateral neph tube placement       Arther Abbott, MD

## 2018-08-28 LAB — CBC WITH AUTO DIFFERENTIAL
Basophils %: 0 % (ref 0–1)
Basophils Absolute: 0 10*3/uL (ref 0.0–0.1)
Eosinophils %: 1 % (ref 0–7)
Eosinophils Absolute: 0.1 10*3/uL (ref 0.0–0.4)
Granulocyte Absolute Count: 0 10*3/uL (ref 0.00–0.04)
Hematocrit: 24.2 % — ABNORMAL LOW (ref 36.6–50.3)
Hemoglobin: 7.9 g/dL — ABNORMAL LOW (ref 12.1–17.0)
Immature Granulocytes: 0 % (ref 0.0–0.5)
Lymphocytes %: 10 % — ABNORMAL LOW (ref 12–49)
Lymphocytes Absolute: 0.5 10*3/uL — ABNORMAL LOW (ref 0.8–3.5)
MCH: 28 PG (ref 26.0–34.0)
MCHC: 32.6 g/dL (ref 30.0–36.5)
MCV: 85.8 FL (ref 80.0–99.0)
MPV: 9.2 FL (ref 8.9–12.9)
Monocytes %: 9 % (ref 5–13)
Monocytes Absolute: 0.5 10*3/uL (ref 0.0–1.0)
NRBC Absolute: 0 10*3/uL (ref 0.00–0.01)
Neutrophils %: 80 % — ABNORMAL HIGH (ref 32–75)
Neutrophils Absolute: 4.2 10*3/uL (ref 1.8–8.0)
Nucleated RBCs: 0 PER 100 WBC
Platelets: 232 10*3/uL (ref 150–400)
RBC: 2.82 M/uL — ABNORMAL LOW (ref 4.10–5.70)
RDW: 12.7 % (ref 11.5–14.5)
WBC: 5.3 10*3/uL (ref 4.1–11.1)

## 2018-08-28 LAB — APTT: aPTT: 58.1 s — ABNORMAL HIGH (ref 22.1–32.0)

## 2018-08-28 LAB — CBC WITH AUTOMATED DIFF
ABS. BASOPHILS: 0 10*3/uL (ref 0.0–0.1)
ABS. EOSINOPHILS: 0.1 10*3/uL (ref 0.0–0.4)
ABS. IMM. GRANS.: 0 10*3/uL (ref 0.00–0.04)
ABS. LYMPHOCYTES: 0.5 10*3/uL — ABNORMAL LOW (ref 0.8–3.5)
ABS. MONOCYTES: 0.5 10*3/uL (ref 0.0–1.0)
ABS. NEUTROPHILS: 4.2 10*3/uL (ref 1.8–8.0)
ABSOLUTE NRBC: 0 10*3/uL (ref 0.00–0.01)
BASOPHILS: 0 % (ref 0–1)
EOSINOPHILS: 1 % (ref 0–7)
HCT: 24.2 % — ABNORMAL LOW (ref 36.6–50.3)
HGB: 7.9 g/dL — ABNORMAL LOW (ref 12.1–17.0)
IMMATURE GRANULOCYTES: 0 % (ref 0.0–0.5)
LYMPHOCYTES: 10 % — ABNORMAL LOW (ref 12–49)
MCH: 28 PG (ref 26.0–34.0)
MCHC: 32.6 g/dL (ref 30.0–36.5)
MCV: 85.8 FL (ref 80.0–99.0)
MONOCYTES: 9 % (ref 5–13)
MPV: 9.2 FL (ref 8.9–12.9)
NEUTROPHILS: 80 % — ABNORMAL HIGH (ref 32–75)
NRBC: 0 PER 100 WBC
PLATELET: 232 10*3/uL (ref 150–400)
RBC: 2.82 M/uL — ABNORMAL LOW (ref 4.10–5.70)
RDW: 12.7 % (ref 11.5–14.5)
WBC: 5.3 10*3/uL (ref 4.1–11.1)

## 2018-08-28 LAB — PTT: aPTT: 58.1 s — ABNORMAL HIGH (ref 22.1–32.0)

## 2018-08-28 MED ORDER — OXYCODONE 5 MG TAB
5 mg | ORAL | Status: AC
Start: 2018-08-28 — End: 2018-08-28
  Administered 2018-08-28: 19:00:00

## 2018-08-28 MED ORDER — PREDNISONE 5 MG TAB
5 mg | ORAL | Status: AC
Start: 2018-08-28 — End: 2018-08-28
  Administered 2018-08-28: 14:00:00

## 2018-08-28 MED ORDER — SODIUM CHLORIDE 0.9 % IV
INTRAVENOUS | Status: DC
Start: 2018-08-28 — End: 2018-09-01
  Administered 2018-08-28 – 2018-08-30 (×4): via INTRAVENOUS

## 2018-08-28 MED ORDER — OXYCODONE 5 MG TAB
5 mg | ORAL | Status: AC
Start: 2018-08-28 — End: 2018-08-28
  Administered 2018-08-28: 14:00:00 via ORAL

## 2018-08-28 MED FILL — SODIUM CHLORIDE 0.9 % IV: INTRAVENOUS | Qty: 1000

## 2018-08-28 MED FILL — NORMAL SALINE FLUSH 0.9 % INJECTION SYRINGE: INTRAMUSCULAR | Qty: 10

## 2018-08-28 MED FILL — SENNA LAX 8.6 MG TABLET: 8.6 mg | ORAL | Qty: 1

## 2018-08-28 MED FILL — OXYCODONE 5 MG TAB: 5 mg | ORAL | Qty: 2

## 2018-08-28 MED FILL — PREDNISONE 5 MG TAB: 5 mg | ORAL | Qty: 1

## 2018-08-28 NOTE — Progress Notes (Signed)
Progress Notes by Thad Ranger, MD at 08/28/18 [phone]                Author: Thad Ranger, MD  Service: Urology  Author Type: Physician       Filed: 08/28/18 0709  Date of Service: 08/28/18 0707  Status: Signed          Editor: Thad Ranger, MD (Physician)                               Urology Progress Note          Patient: Bruce Clark  MRN: [id number]   SSN: [id number]          Date of Birth: Dec 16, 1950    Age: 68 y.o.   Sex: male                ADMITTED: 08/26/2018  to Emilio Math, MD for AKI (acute kidney injury) [facility]) [N17.9]   POD# * No surgery found *       He underwent bilateral nephrostomy tube placement yesterday.  Both tubes draining bloody urine.  He denies any pain.  Not much output from the Foley.      Vitals: Temp (24hrs), Avg:98 ??F (36.7 ??C), Min:97.3 ??F (36.3 ??C), Max:98.5 ??F (36.9 ??C)                     Blood pressure (!) 163/97, pulse 93, temperature 97.6 ??F (36.4  ??C), resp. rate 16, weight 68.4 kg (150 lb 12.7 oz), SpO2 100 %.      Intake and Output:   01/09 1901 - 01/11 0700   In: 303.6 [P.O.:90; I.V.:213.6]   Out: 7353 [Urine:1690]                  Labs:   Labs:      Lab Results         Component  Value  Date/Time            WBC  5.3  08/28/2018 04:48 AM       HGB  7.9 (L)  08/28/2018 04:48 AM            Creatinine  8.96 (H)  08/27/2018 03:55 AM              Assessment/Plan:     Status post bilateral nephrostomy tube placement.   Follow creatinine.           Signed By: Thad Ranger, MD -  August 28, 2018

## 2018-08-28 NOTE — Progress Notes (Signed)
Dual skin assess performed with A. Lowery RN, no open areas noted. Skin remains intact except for Bilateral surgically placed Nephrostomy Tubes.

## 2018-08-28 NOTE — Progress Notes (Signed)
Spiritual Care Partner Volunteer visited patient in Rm 448 on 08/28/18.  Documented by:  [address], Chaplain, MDiv, MACE  [address] [phone])

## 2018-08-28 NOTE — Other (Signed)
Bedside and Verbal shift change report given to [place] (oncoming nurse) by Nicole Kindred (offgoing nurse). Report included the following information SBAR, Kardex, Procedure Summary, Intake/Output, Recent Results and Cardiac Rhythm NSR.

## 2018-08-28 NOTE — Other (Addendum)
1228:  Paged out to GU to clarify if Foley can be remove as no output at all after bilateral nephrostomy tube placement yesterday.    1306: Spoke with GU on call who voiced to address Foley with Dr. Percell Miller in the morning.  Keep Foley in for now per GU.

## 2018-08-28 NOTE — Progress Notes (Signed)
Spiritual Care Partner Volunteer visited patient in Rm 448 on 08/28/18.  Documented by:  [address] Chaplain, MDiv, MACE  [address] [phone])

## 2018-08-28 NOTE — Other (Signed)
TRANSFER - OUT REPORT:    Verbal report given to [place] (name) on Bruce Clark  being transferred to [facility] room 605 (unit) for routine post - op       Report consisted of patient???s Situation, Background, Assessment and   Recommendations(SBAR).     Information from the following report(s) SBAR, Kardex, Procedure Summary, Intake/Output, MAR, Recent Results and Cardiac Rhythm NSR was reviewed with the receiving nurse.    Lines:   Peripheral IV 08/26/18 Left Antecubital (Active)   Site Assessment Clean, dry, & intact 08/28/2018 12:13 PM   Phlebitis Assessment 0 08/28/2018 12:13 PM   Infiltration Assessment 0 08/28/2018 12:13 PM   Dressing Status Clean, dry, & intact 08/28/2018 12:13 PM   Dressing Type Transparent 08/28/2018 12:13 PM   Hub Color/Line Status Green;Infusing 08/28/2018 12:13 PM   Action Taken Open ports on tubing capped 08/27/2018  3:41 AM   Alcohol Cap Used Yes 08/28/2018 12:13 PM        Opportunity for questions and clarification was provided.      Patient transported with:   [employer]

## 2018-08-28 NOTE — Progress Notes (Addendum)
[facility]                                                                                          Hospitalist Progress Note  Emilio Math, MD  Answering service: [phone] OR [phone] from in house phone        Date of Service:  08/28/2018  NAME:  Bruce Clark  DOB:  1950-09-27  MRN:  [id number]      Admission Summary:   Admitted weak and debilitated for eval insetting of lung cancer and prostate cancer     Interval history / Subjective:       08/28/2018 :  Patient comfortable.  Continues on heparin for extensive DVT.  Reviewed CAT scan with patient's medical power of attorney today.  Advised to discuss same with hematology oncology record Dr. Melony Overly;  Renal number 4 AM.       Assessment & Plan:     ?? Obst uropathy ( 800 cc's post void residual w foley left in place), urology to follow CT abd/pelvis  on order  - for bil PERC's ; accomplished in 4 renal numbers in a.m.   ?? AKI - for nephrology input and avoiding nephrotoxic's and close following  Cr  Yet up; will follow up in a.m.  ?? DVT - for heparin drip, (failed Eliquis taken up to admission) heparin to be cont   ?? Anemia for cont monitoring and f/u hematopoetics   ?? Elevated BNP  ?? Prostate cancer on chemo followed by Dr Melony Overly   ?? H/o stage 3 lung cancer post resection and rxt followed by Dr. Melony Overly   ?? DNR status (confirmed with pt on admissions)   ?? Clinically insignificant low serum sodium   ?? Low wt status  ?? Ambulation difficulties   Code status: DNR   DVT prophylaxis: on hep drip between proceedures     Care Plan discussed with: Patient/Family and Nurse consultants 9 hem [name]   Disposition: TBD     Hospital Problems  Date Reviewed: September 02, 2018          Codes Class Noted POA    * (Principal) AKI (acute kidney injury) ([place]) ICD-10-CM: N17.9  ICD-9-CM: 584.9  09-02-2018 Unknown                Review of Systems:   Pertinent items are noted in HPI.  Fortunately no pain.      Vital Signs:     Last 24hrs VS reviewed since prior progress note. Most recent are:  Visit Vitals  BP 154/86 (BP 1 Location: Right arm, BP Patient Position: At rest;Head of bed elevated (Comment degrees))   Pulse 98   Temp 98 ??F (36.7 ??C)   Resp 18   Wt 66.3 kg (146 lb 2.6 oz)   SpO2 97%   BMI 22.89 kg/m??         Intake/Output Summary (Last 24 hours) at 08/28/2018 1141  Last data filed at 08/28/2018 0901  Gross per 24 hour   Intake 344.6 ml   Output 3345 ml   Net -3000.4 ml  Physical Examination:             Constitutional:  No acute distress, weak, debilitated, and  Emaciated, appearing. Else cooperative, pleasant    ENT:  Oral mucous moist, oropharynx benign.    Resp:  CTA bilaterally. No wheezing/rhonchi/rales. No accessory muscle use   CV:  Regular rhythm, normal rate, no  gallops, rubs    GI:  Soft, non distended, non tender. normoactive bowel sounds, no hepatosplenomegaly     Musculoskeletal:  rt edema > lt  edema, warm,      Neurologic:  Moves all extremities.  AAOx3, CN II-XII reviewed     Psych:  Good insight, Not anxious nor agitated.  Skin:  Good turgor, no rashes or ulcers       Data Review:    Review and/or order of clinical lab test      Labs:     Recent Labs     08/28/18  0448 08/27/18  0355   WBC 5.3 5.3   HGB 7.9* 8.1*   HCT 24.2* 25.0*   PLT 232 263     Recent Labs     08/27/18  0355 08/26/18  1425 08/26/18  1141   NA 130* 133* 132*   K 4.1 3.9 4.6   CL 95* 96* 96*   CO2 25 28 27    BUN 80* 79* 81*   CREA 8.96* 8.52* 8.71*   GLU 109* 94 97   CA 9.2 8.7 9.2     Recent Labs     08/26/18  1141   SGOT 26   ALT 9*   AP 88   TBILI 0.3   TP 8.2   ALB 2.8*   GLOB 5.4*     Recent Labs     08/28/18  0448 08/27/18  0954 08/27/18  0226  08/26/18  1425   INR  --   --   --   --  1.2*   PTP  --   --   --   --  11.8*   APTT 58.1* 58.1* 51.2*   < > 36.3*    < > = values in this interval not displayed.      No results for input(s): FE, TIBC, PSAT, FERR in the last 72 hours.   No results found for: FOL, RBCF    No results for input(s): PH, PCO2, PO2 in the last 72 hours.  Recent Labs     08/26/18  1425   TROIQ <0.05     Lab Results   Component Value Date/Time    Cholesterol, total 184 07/01/2016 10:50 PM    HDL Cholesterol 64 07/01/2016 10:50 PM    LDL, calculated 109.2 (H) 07/01/2016 10:50 PM    Triglyceride 54 07/01/2016 10:50 PM    CHOL/HDL Ratio 2.9 07/01/2016 10:50 PM     Lab Results   Component Value Date/Time    Glucose (POC) 103 (H) 04/28/2016 10:31 AM    Glucose (POC) 90 04/26/2016 10:33 PM    Glucose (POC) 71 04/26/2016 05:12 PM    Glucose (POC) 106 (H) 04/26/2016 12:03 PM    Glucose (POC) 117 (H) 04/25/2016 04:52 PM     Lab Results   Component Value Date/Time    Color YELLOW/STRAW 08/26/2018 02:25 PM    Appearance CLEAR 08/26/2018 02:25 PM    Specific gravity 1.012 08/26/2018 02:25 PM    Specific gravity 1.025 04/22/2016 08:22 PM    pH (UA) 5.0 08/26/2018 02:25 PM  Protein NEGATIVE  08/26/2018 02:25 PM    Glucose NEGATIVE  08/26/2018 02:25 PM    Ketone NEGATIVE  08/26/2018 02:25 PM    Bilirubin NEGATIVE  08/26/2018 02:25 PM    Urobilinogen 0.2 08/26/2018 02:25 PM    Nitrites NEGATIVE  08/26/2018 02:25 PM    Leukocyte Esterase NEGATIVE  08/26/2018 02:25 PM    Epithelial cells FEW 08/26/2018 02:25 PM    Bacteria NEGATIVE  08/26/2018 02:25 PM    WBC 0-4 08/26/2018 02:25 PM    RBC 0-5 08/26/2018 02:25 PM         Medications Reviewed:     Current Facility-Administered Medications   Medication Dose Route Frequency   ??? predniSONE (DELTASONE) 5 mg tablet       ??? 0.9% sodium chloride infusion  50 mL/hr IntraVENous CONTINUOUS   ??? heparin 25,000 units in D5W 250 ml infusion  18-36 Units/kg/hr IntraVENous TITRATE   ??? predniSONE (DELTASONE) tablet 5 mg  5 mg Oral DAILY   ??? senna (SENOKOT) tablet 8.6 mg  1 Tab Oral QHS   ??? sodium chloride (NS) flush 5-40 mL  5-40 mL IntraVENous Q8H   ??? sodium chloride (NS) flush 5-40 mL  5-40 mL IntraVENous PRN   ??? LORazepam (ATIVAN) tablet 0.5 mg  0.5 mg Oral BID PRN    ??? oxyCODONE IR (ROXICODONE) tablet 5-10 mg  5-10 mg Oral Q4H PRN   ??? ondansetron (ZOFRAN) injection 4 mg  4 mg IntraVENous Q4H PRN     ______________________________________________________________________  EXPECTED LENGTH OF STAY: 3d 4h  ACTUAL LENGTH OF STAY:          2                 Emilio Math, MD

## 2018-08-28 NOTE — Other (Addendum)
0900:  Came to answer call be as was reported pt wanted pain medication.  I came in with pain medication but he voiced he only wanted to straighten up.  Tried to help patient straighten up but he got upset as it's not how he wanted.  He told me how he wanted me to help him and I did what he asked, then he got even more frustrated.  I informed him that I was helping him but he did not want it the way I was doing but his way and still frustrated.  I inform him I will get him more comfortable, asked him to give me time.  I did want I needed to do and he was more comfortable.  He apologize for taking it out on me.  Gave him pain medication for comfort.  He declined offer to sit in chair.

## 2018-08-28 NOTE — Progress Notes (Addendum)
Dual skin assess performed with A. Lowery RN, no open areas noted. Skin remains intact except for Bilateral surgically placed Nephrostomy Tubes.

## 2018-08-28 NOTE — Progress Notes (Signed)
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 is a 68 y.o. male with stage IV prostate and now stage IV NSCLC admitted with acute renal failure, R extensive DVT and R chest wall pain    Today  Pain well controlled  S/p bilateral PCN 08/27/2018- bloody urine  His leg is some what less swollen on heparin  Has a Foley  No fevers  Depressed    Past Medical History:   Diagnosis Date   ??? Lung cancer ([place])    ??? Prostate cancer ([place])    ??? Stroke ([place])     PT STATES, "I HAD A STROKE A LONG TIME AGO"       Past Surgical History:   Procedure Laterality Date   ??? CHEST SURGERY PROCEDURE UNLISTED  04/27/2017    BRONCHOSCOPY/MEDIASTINOSCOPY   ??? HX GI      HERNIA REPAIR   ??? HX ORTHOPAEDIC  1994    ROD IN RIGHT LEG   ??? IR NEPHROSTOMY PERC LT PLC CATH  SI  08/27/2018   ??? IR NEPHROSTOMY PERC RT PLC CATH  SI  08/27/2018       No Known Allergies    Current Facility-Administered Medications   Medication Dose Route Frequency Provider Last Rate Last Dose   ??? predniSONE (DELTASONE) 5 mg tablet            ??? 0.9% sodium chloride infusion  50 mL/hr IntraVENous CONTINUOUS Arther Abbott, MD   Stopped at 08/27/18 1600   ??? heparin 25,000 units in D5W 250 ml infusion  18-36 Units/kg/hr IntraVENous TITRATE Emilio Math, MD 9.9 mL/hr at 08/28/18 0829 15 Units/kg/hr at 08/28/18 0829   ??? predniSONE (DELTASONE) tablet 5 mg  5 mg Oral DAILY Emilio Math, MD   5 mg at 08/28/18 0834   ??? senna (SENOKOT) tablet 8.6 mg  1 Tab Oral QHS Emilio Math, MD   8.6 mg at 08/27/18 2210   ??? sodium chloride (NS) flush 5-40 mL  5-40 mL IntraVENous Q8H Emilio Math, MD   10 mL at 08/27/18 1316   ??? sodium chloride (NS) flush 5-40 mL  5-40 mL IntraVENous PRN Emilio Math, MD       ??? LORazepam (ATIVAN) tablet 0.5 mg  0.5 mg Oral BID PRN Emilio Math, MD        ??? oxyCODONE IR (ROXICODONE) tablet 5-10 mg  5-10 mg Oral Q4H PRN Emilio Math, MD   10 mg at 08/28/18 0902   ??? ondansetron (ZOFRAN) injection 4 mg  4 mg IntraVENous Q4H PRN Emilio Math, MD           Social History     Socioeconomic History   ??? Marital status: SINGLE     Spouse name: Not on file   ??? Number of children: Not on file   ??? Years of education: Not on file   ??? Highest education level: Not on file   Tobacco Use   ??? Smoking status: Former Smoker     Packs/day: 0.50     Years: 47.00     Pack years: 23.50     Last attempt to quit: 06/2016     Years since quitting: 2.1   ??? Smokeless tobacco: Never Used   Substance and Sexual Activity   ???  Alcohol use: No   ??? Drug use: No       Family History   Problem Relation Age of Onset   ??? Cancer Mother         COLON   ??? Cancer Father         BRAIN TUMOR   ??? No Known Problems Sister    ??? Arthritis-osteo Brother    ??? No Known Problems Sister    ??? No Known Problems Sister    ??? No Known Problems Sister    ??? No Known Problems Brother    ??? Anesth Problems Neg Hx      ROS  A review of systems was obtained and is negative except as listed in HPI.  ECOG PS is 2    Physical Examination:   Visit Vitals  BP 154/86 (BP 1 Location: Right arm, BP Patient Position: At rest;Head of bed elevated (Comment degrees))   Pulse 98   Temp 98 ??F (36.7 ??C)   Resp 18   Wt 146 lb 2.6 oz (66.3 kg)   SpO2 97%   BMI 22.89 kg/m??     General appearance - alert, appears fatigued and ill  Mental status - oriented to person, place, and time  Mouth - mucous membranes moist, no lesions noted  Neck - supple, no significant adenopathy  ABD- soft. Foley in place. Bilateral PCN with bloody urine  Extremities - peripheral pulses normal, 1+ LLE edema      LABS  Lab Results   Component Value Date/Time    WBC 5.3 08/28/2018 04:48 AM    HGB 7.9 (L) 08/28/2018 04:48 AM    HCT 24.2 (L) 08/28/2018 04:48 AM    PLATELET 232 08/28/2018 04:48 AM    MCV 85.8 08/28/2018 04:48 AM    ABS. NEUTROPHILS 4.2 08/28/2018 04:48 AM      Lab Results   Component Value Date/Time    Sodium 130 (L) 08/27/2018 03:55 AM    Potassium 4.1 08/27/2018 03:55 AM    Chloride 95 (L) 08/27/2018 03:55 AM    CO2 25 08/27/2018 03:55 AM    Glucose 109 (H) 08/27/2018 03:55 AM    BUN 80 (H) 08/27/2018 03:55 AM    Creatinine 8.96 (H) 08/27/2018 03:55 AM    GFR est AA 7 (L) 08/27/2018 03:55 AM    GFR est non-AA 6 (L) 08/27/2018 03:55 AM    Calcium 9.2 08/27/2018 03:55 AM     Lab Results   Component Value Date/Time    AST (SGOT) 26 08/26/2018 11:41 AM    Alk. phosphatase 88 08/26/2018 11:41 AM    Protein, total 8.2 08/26/2018 11:41 AM    Albumin 2.8 (L) 08/26/2018 11:41 AM    Globulin 5.4 (H) 08/26/2018 11:41 AM    A-G Ratio 0.5 (L) 08/26/2018 11:41 AM     Recent Labs     08/26/18  1141 07/05/18  0850 05/11/18  0959 03/24/18  0949 02/17/18  0851 01/01/18  0811 11/27/17  0806   PSA 0.1 0.0*  --  0.1 0.1 0.1 0.1   PSALT  --   --  <0.1  --   --   --   --        IMAGING  PET CT  03/23/17  IMPRESSION: Mass lesion right lower lobe is hypermetabolic and compatible with  the biopsy confirmed the result of adenocarcinoma. There are soft tissue  densities in the anterior abdominal wall bilaterally which are stable compared  to the  prior chest abdomen pelvis CT but new compared to the prior PET/CT.  Please correlate with any recent abdominal wall surgery as these may represent  port sites. Otherwise normal tracer distribution. Stable sclerotic osseous  metastatic disease without abnormal activity.    PATHOLOGY  Supraclavicular node   CYTOLOGIC INTERPRETATION:   Adenocarcinoma, consistent with prostate primary   See comment   General Categorization   Positive for malignancy.   Specimen Adequacy   Satisfactory for evaluation.   Comment   Touch preps and a core biopsy are examined and show islands of adenocarcinoma surrounded by fibrous stroma. A panel of immunohistochemical stains was performed to evaluate site of origin. The  tumor cells are focally positive for PSA and PSAP and negative for CK7, CK20, TTF-1    CT CAP 03/22/18  IMPRESSION  IMPRESSION:   1. Status post right lung surgery with right perihilar consolidation and  scarring consistent with post surgery and radiation therapy change. These  findings are stable.  2. Enlarged prostate.  3. Sclerotic bone metastases unchanged.  4. Atherosclerotic aorta without aneurysm    CT CAP 07/13/18:   IMPRESSION:   1. Increased size of the prostate and increased nodularity involving the bladder  base and bladder wall.  2. New left hydronephrosis and hydroureter with obstruction at the level of the  ureterovesical junction.  3. Status post right lung surgery with elevation of the right hemidiaphragm and  surgical staples and scarring in the right hilum consistent with postsurgical  and postradiation therapy effect, unchanged.  4. Sclerotic bone metastases to the spine unchanged.  5. New low-attenuation liver lesion could represent a metastasis.  6. Dilated fluid-filled esophagus suggesting esophageal dysmotility with  thickened wall possibly secondary to radiation therapy.  7. Atherosclerotic aorta with coronary artery calcifications. No abdominal  aortic aneurysm.    CT 08/26/2018    IMPRESSION  IMPRESSION:  ??  1. Unchanged appearance of right hemithorax as above.  2. Increased number and size of hypoattenuating lesions of the liver consistent  with worsening hepatic metastatic disease.  3. Bilateral renal pelvocaliectasis and ureteral distention, new on the right  and increased on the left.  4. Foley catheter within urinary bladder which cannot be further assessed.  5. Osteosclerotic metastatic disease.  6. Anasarca appearance demonstrated in the interval.   ??  Pathology from liver biopsy 08/17/18:   Specimen Source   1: Liver, Core biopsy with touch intepretation:   CYTOLOGIC INTERPRETATION:   1. Liver, Core biopsy with touch intepretation:    Poorly differentiated adenocarcinoma consistent with metastatic pulmonary adenocarcinoma   See comment   General Categorization   Positive for malignancy.       ASSESSMENT  [name] is a 68 y.o. male with widely metastatic prostate adenocarcinoma and lung adenocarcinoma status post right lower lobe lobectomy on 05/22/17. Most recent imaging concerning for progression with new liver mets. He is admitted for acute renal failure    PLAN    Acute renal failure  Post obstructive  CT with Bilateral renal pelvocaliectasis and ureteral distention, new on the right  and increased on the left- likely due to previously seen progressive prostate cancer possibly infiltrating the bladder  PCN bilaterally today  Urology and Nephrology inputs are appreciated  Ideally would be nice to have a cystoscopy to understand the intravesical disease and r/o a new bladder primary but at the moment not feasible as he is on anticoagulation    Lower extremity edema and h/o DVT  Diagnosed  04/22/16  Cancer associated  On lifelong Eliquis 48m BID   Now with marked RLE  Venous duplex obtained in ER with results pending   If he has an acute DVT  Agree with Heparin gtt   Hold Eliquis given his ARF    If GFR improves will consider Lovenox      Prostate cancer stage IV  Castrate sensitive, treatment na??ve widely metastatic prostate cancer to lymph nodes above and below the diaphragm and bones.  Prostate adenocarcinoma diagnosed on biopsy of the supraclavicular lymph node. Discussed in tumor board and a small cell component has been ruled out.  PSA at the time of diagnosis at 2400.  ??  Currently on Lupron 45 mg every 6 months. On Zytiga 1,0039mdaily + prednisone 5 mg daily which has controlled his disease now for > 2 years  Though his PSA is still undetectable at 0.1 his prior scans especially the bladder masses suggest progressive prostate cancer and probable castrate resistance  We will hold Zytiga inpatient    [place] [facility] and consider switching to [facility]  R lung adenocarcinoma, [id number] stage IIIB- Parietal pleural involvement- PD-L1 99%  S/p RLL lobectomy on 05/22/17 then received PORT  Now with biopsy proven liver metastases  Repeat CT CAP pending  Plan on palliative Keytruda OP      Bone metastases  Diffuse  Completed palliative radiation that was initiated on 06/09/16      Right sided chest wall pain  Unclear cause  No corresponding mets- CT chest awaited  Consult palliative care    Anemia   Worsening, likely due to progressive cancer, renal failure  Now with hematuria as well      If Hb drops by a gram each day [facility]. Will monitor for now      Will continue to follow       RaJuliene PinaD, [facility]  '

## 2018-08-28 NOTE — Progress Notes (Signed)
Urology Progress Note    Patient: Bruce Clark MRN: [id number]  SSN: [id number]    Date of Birth: February 12, 1951  Age: 68 y.o.  Sex: male        ADMITTED: 08/26/2018 to Emilio Math, MD for AKI (acute kidney injury) [facility]) [N17.9]  POD# * No surgery found *     He underwent bilateral nephrostomy tube placement yesterday.  Both tubes draining bloody urine.  He denies any pain.  Not much output from the Foley.    Vitals: Temp (24hrs), Avg:98 ??F (36.7 ??C), Min:97.3 ??F (36.3 ??C), Max:98.5 ??F (36.9 ??C)                   Blood pressure (!) 163/97, pulse 93, temperature 97.6 ??F (36.4 ??C), resp. rate 16, weight 68.4 kg (150 lb 12.7 oz), SpO2 100 %.    Intake and Output:  01/09 1901 - 01/11 0700  In: 303.6 [P.O.:90; I.V.:213.6]  Out: 0973 [Urine:1690]            Labs:  Labs:   Lab Results   Component Value Date/Time    WBC 5.3 08/28/2018 04:48 AM    HGB 7.9 (L) 08/28/2018 04:48 AM    Creatinine 8.96 (H) 08/27/2018 03:55 AM         Assessment/Plan:  Status post bilateral nephrostomy tube placement.  Follow creatinine.       Signed By: Thad Ranger, MD - August 28, 2018

## 2018-08-28 NOTE — Progress Notes (Signed)
NAME: Bruce Clark        DOB:  22-Nov-1950        MRN:  [id number]        Assessment :    Plan:  --AKI- due to obstructive uropathy. UA is bland. No other cause to explain AKI. Basln Cr normal in Nov 2019  Hyponatremia  Obstructive Uropathy- likely due to prostate cancer  Metastatic Prostate and Lung Cancer  Liver mets  HTN --Creatinine up a little.  Not uremic.  Potassium OK. Has bilateral PCN's.  Fair UO.      Poor po. Will start NS.    D/W family at bedside.         Subjective:     Chief Complaint:  Poorly responsive.      Review of Systems:    Symptom Y/N Comments  Symptom Y/N Comments   Fever/Chills    Chest Pain     Poor Appetite    Edema     Cough    Abdominal Pain     Sputum    Joint Pain     SOB/DOE    Pruritis/Rash     Nausea/vomit    Tolerating PT/OT     Diarrhea    Tolerating Diet     Constipation    Other       Could not obtain due to:      Objective:     VITALS:   Last 24hrs VS reviewed since prior progress note. Most recent are:  Visit Vitals  BP 146/90 (BP 1 Location: Right arm, BP Patient Position: At rest)   Pulse (!) 107   Temp 98.5 ??F (36.9 ??C)   Resp 20   Wt 66.3 kg (146 lb 2.6 oz)   SpO2 100%   BMI 22.89 kg/m??       Intake/Output Summary (Last 24 hours) at 08/28/2018 1304  Last data filed at 08/28/2018 1213  Gross per 24 hour   Intake 381.56 ml   Output 3955 ml   Net -3573.44 ml      Telemetry Reviewed:     PHYSICAL EXAM:  General: NAD  No edema      Lab Data Reviewed: (see below)    Medications Reviewed: (see below)    PMH/SH reviewed - no change compared to H&P  ________________________________________________________________________  Care Plan discussed with:  Patient     Family      RN     Care Manager                    Consultant:          Comments   >50% of visit spent in counseling and coordination of care       ________________________________________________________________________   Rosalia Hammers, MD     Procedures: see electronic medical records for all procedures/Xrays and details which  were not copied into this note but were reviewed prior to creation of Plan.      LABS:  Recent Labs     08/28/18  0448 08/27/18  0355   WBC 5.3 5.3   HGB 7.9* 8.1*   HCT 24.2* 25.0*   PLT 232 263     Recent Labs     08/27/18  0355 08/26/18  1425 08/26/18  1141   NA 130* 133* 132*   K 4.1 3.9 4.6   CL 95* 96* 96*   CO2 25 28 27    BUN 80* 79* 81*   CREA  8.96* 8.52* 8.71*   GLU 109* 94 97   CA 9.2 8.7 9.2     Recent Labs     08/26/18  1141   SGOT 26   AP 88   TP 8.2   ALB 2.8*   GLOB 5.4*     Recent Labs     08/28/18  0448 08/27/18  0954 08/27/18  0226  08/26/18  1425   INR  --   --   --   --  1.2*   PTP  --   --   --   --  11.8*   APTT 58.1* 58.1* 51.2*   < > 36.3*    < > = values in this interval not displayed.      No results for input(s): FE, TIBC, PSAT, FERR in the last 72 hours.   No results found for: FOL, RBCF   No results for input(s): PH, PCO2, PO2 in the last 72 hours.  No results for input(s): CPK, CKMB in the last 72 hours.    No lab exists for component: TROPONINI  No components found for: [facility]  Lab Results   Component Value Date/Time    Color YELLOW/STRAW 08/26/2018 02:25 PM    Appearance CLEAR 08/26/2018 02:25 PM    Specific gravity 1.012 08/26/2018 02:25 PM    Specific gravity 1.025 04/22/2016 08:22 PM    pH (UA) 5.0 08/26/2018 02:25 PM    Protein NEGATIVE  08/26/2018 02:25 PM    Glucose NEGATIVE  08/26/2018 02:25 PM    Ketone NEGATIVE  08/26/2018 02:25 PM    Bilirubin NEGATIVE  08/26/2018 02:25 PM    Urobilinogen 0.2 08/26/2018 02:25 PM    Nitrites NEGATIVE  08/26/2018 02:25 PM    Leukocyte Esterase NEGATIVE  08/26/2018 02:25 PM    Epithelial cells FEW 08/26/2018 02:25 PM    Bacteria NEGATIVE  08/26/2018 02:25 PM    WBC 0-4 08/26/2018 02:25 PM    RBC 0-5 08/26/2018 02:25 PM       MEDICATIONS:  Current Facility-Administered Medications   Medication Dose Route Frequency    ??? predniSONE (DELTASONE) 5 mg tablet       ??? 0.9% sodium chloride infusion  75 mL/hr IntraVENous CONTINUOUS   ??? heparin 25,000 units in D5W 250 ml infusion  18-36 Units/kg/hr IntraVENous TITRATE   ??? predniSONE (DELTASONE) tablet 5 mg  5 mg Oral DAILY   ??? senna (SENOKOT) tablet 8.6 mg  1 Tab Oral QHS   ??? sodium chloride (NS) flush 5-40 mL  5-40 mL IntraVENous Q8H   ??? sodium chloride (NS) flush 5-40 mL  5-40 mL IntraVENous PRN   ??? LORazepam (ATIVAN) tablet 0.5 mg  0.5 mg Oral BID PRN   ??? oxyCODONE IR (ROXICODONE) tablet 5-10 mg  5-10 mg Oral Q4H PRN   ??? ondansetron (ZOFRAN) injection 4 mg  4 mg IntraVENous Q4H PRN

## 2018-08-28 NOTE — Progress Notes (Signed)
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 male with stage IV  prostate and now stage IV NSCLC admitted with acute renal failure, R extensive DVT and R chest wall pain      Today   Pain well controlled   S/p bilateral PCN 08/27/2018- bloody urine   His leg is some what less swollen on heparin   Has a Foley   No fevers   Depressed        Past Medical History:        Diagnosis  Date         ?  Lung cancer ([place])       ?  Prostate cancer ([employer])       ?  Stroke [facility])            PT STATES, "I HAD A STROKE A LONG TIME AGO"             Past Surgical History:         Procedure  Laterality  Date          ?  CHEST SURGERY PROCEDURE UNLISTED    04/27/2017          BRONCHOSCOPY/MEDIASTINOSCOPY          ?  HX GI              HERNIA REPAIR          ?  HX ORTHOPAEDIC    1994          ROD IN RIGHT LEG          ?  IR NEPHROSTOMY PERC LT PLC CATH  SI    08/27/2018          ?  IR NEPHROSTOMY PERC RT PLC CATH  SI    08/27/2018           No Known Allergies        Current Facility-Administered Medications             Medication  Dose  Route  Frequency  Provider  Last Rate  Last Dose              ?  predniSONE (DELTASONE) 5 mg tablet                    ?  0.9% sodium chloride infusion   50 mL/hr  IntraVENous  CONTINUOUS  Mahajan, Namit, MD     Stopped at 08/27/18 1600     ?  heparin 25,000 units in D5W 250 ml infusion   18-36 Units/kg/hr  IntraVENous  TITRATE  Emilio Math, MD  9.9 mL/hr at 08/28/18 0829  15 Units/kg/hr at 08/28/18  2563     ?   predniSONE (DELTASONE) tablet 5 mg   5 mg  Oral  DAILY  Emilio Math, MD     5 mg at 08/28/18 8937     ?  senna (SENOKOT) tablet 8.6 mg   1 Tab  Oral  QHS  Emilio Math, MD     8.6 mg at 08/27/18 2210     ?  sodium chloride (NS) flush 5-40 mL   5-40 mL  IntraVENous  Q8H  Emilio Math, MD     10 mL at 08/27/18 1316     ?  sodium chloride (NS) flush 5-40 mL   5-40 mL  IntraVENous  PRN  Emilio Math, MD           ?  LORazepam (ATIVAN) tablet 0.5 mg   0.5 mg  Oral  BID PRN  Emilio Math, MD           ?  oxyCODONE IR (ROXICODONE) tablet 5-10 mg   5-10 mg  Oral  Q4H PRN  Emilio Math, MD     10 mg at 08/28/18 0902              ?  ondansetron (ZOFRAN) injection 4 mg   4 mg  IntraVENous  Q4H PRN  Emilio Math, MD                   Social History          Socioeconomic History         ?  Marital status:  SINGLE              Spouse name:  Not on file         ?  Number of children:  Not on file     ?  Years of education:  Not on file     ?  Highest education level:  Not on file       Tobacco Use         ?  Smoking status:  Former Smoker              Packs/day:  0.50         Years:  47.00         Pack years:  23.50         Last attempt to quit:  06/2016         Years since quitting:  2.1         ?  Smokeless tobacco:  Never Used       Substance and Sexual Activity         ?  Alcohol use:  No         ?  Drug use:  No             Family History         Problem  Relation  Age of Onset          ?  Cancer  Mother                COLON          ?  Cancer  Father                BRAIN TUMOR          ?  No Known Problems  Sister       ?  Arthritis-osteo  Brother       ?  No Known Problems  Sister       ?  No Known Problems  Sister       ?  No Known Problems  Sister       ?  No Known Problems  Brother            ?  Anesth Problems  Neg Hx          ROS   A review of systems was obtained and is negative except as listed in HPI.   ECOG PS is 2      Physical Examination:    Visit Vitals      BP  154/86 (BP 1 Location: Right arm, BP  Patient Position: At rest;Head of bed elevated (Comment degrees))     Pulse  98     Temp  98 ??F (36.7 ??C)     Resp  18     Wt  146 lb 2.6 oz (66.3 kg)     SpO2  97%        BMI  22.89 kg/m??        General appearance - alert, appears fatigued and ill   Mental status - oriented to person, place, and time   Mouth - mucous membranes moist, no lesions noted   Neck - supple, no significant adenopathy   ABD- soft. Foley in place. Bilateral PCN with bloody urine   Extremities - peripheral pulses normal, 1+ LLE edema         LABS     Lab Results         Component  Value  Date/Time            WBC  5.3  08/28/2018 04:48 AM       HGB  7.9 (L)  08/28/2018 04:48 AM       HCT  24.2 (L)  08/28/2018 04:48 AM       PLATELET  232  08/28/2018 04:48 AM       MCV  85.8  08/28/2018 04:48 AM            ABS. NEUTROPHILS  4.2  08/28/2018 04:48 AM          Lab Results         Component  Value  Date/Time            Sodium  130 (L)  08/27/2018 03:55 AM       Potassium  4.1  08/27/2018 03:55 AM       Chloride  95 (L)  08/27/2018 03:55 AM       CO2  25  08/27/2018 03:55 AM       Glucose  109 (H)  08/27/2018 03:55 AM       BUN  80 (H)  08/27/2018 03:55 AM       Creatinine  8.96 (H)  08/27/2018 03:55 AM       GFR est AA  7 (L)  08/27/2018 03:55 AM       GFR est non-AA  6 (L)  08/27/2018 03:55 AM            Calcium  9.2  08/27/2018 03:55 AM          Lab Results         Component  Value  Date/Time            AST (SGOT)  26  08/26/2018 11:41 AM       Alk. phosphatase  88  08/26/2018 11:41  AM       Protein, total  8.2  08/26/2018 11:41 AM       Albumin  2.8 (L)  08/26/2018 11:41 AM       Globulin  5.4 (H)  08/26/2018 11:41 AM            A-G Ratio  0.5 (L)  08/26/2018 11:41 AM          Recent Labs                 08/26/18   1141  07/05/18   0850  05/11/18   0959  03/24/18   0949  02/17/18   0851  01/01/18   0811  11/27/17   0806     PSA  0.1  0.0*   --   0.1  0.1  0.1  0.1              PSALT   --    --   <0.1   --    --    --    --            IMAGING    PET CT  03/23/17   IMPRESSION: Mass lesion right lower lobe is hypermetabolic and compatible with   the biopsy confirmed the result of adenocarcinoma. There are soft tissue   densities in the anterior abdominal wall bilaterally which are stable compared   to the prior chest abdomen pelvis CT but new compared to the prior PET/CT.   Please correlate with any recent abdominal wall surgery as these may represent   port sites. Otherwise normal tracer distribution. Stable sclerotic osseous   metastatic disease without abnormal activity.      PATHOLOGY   Supraclavicular node    CYTOLOGIC INTERPRETATION:    Adenocarcinoma, consistent with prostate primary    See comment    General Categorization    Positive for malignancy.    Specimen Adequacy    Satisfactory for evaluation.    Comment    Touch preps and a core biopsy are examined and show islands of adenocarcinoma surrounded by fibrous stroma. A panel of immunohistochemical stains was performed  to evaluate site of origin. The tumor cells are focally positive for PSA and PSAP and negative for CK7, CK20, TTF-1      CT CAP 03/22/18   IMPRESSION   IMPRESSION:    1. Status post right lung surgery with right perihilar consolidation and   scarring consistent with post surgery and radiation therapy change. These   findings are stable.   2. Enlarged prostate.   3. Sclerotic bone metastases unchanged.   4. Atherosclerotic aorta without aneurysm      CT CAP 07/13/18:    IMPRESSION:    1. Increased size of the prostate and increased nodularity involving the bladder   base and bladder wall.   2. New left hydronephrosis and hydroureter with obstruction at the level of the   ureterovesical junction.   3. Status post right lung surgery with elevation of the right hemidiaphragm and   surgical staples and scarring in the right hilum consistent with postsurgical   and postradiation therapy effect, unchanged.   4. Sclerotic bone metastases to the spine unchanged.   5. New low-attenuation liver  lesion could represent a metastasis.   6. Dilated fluid-filled esophagus suggesting esophageal dysmotility with   thickened wall possibly secondary to radiation therapy.   7. Atherosclerotic aorta with coronary artery calcifications. No abdominal   aortic aneurysm.  CT 08/26/2018      IMPRESSION   IMPRESSION:   ??   1. Unchanged appearance of right hemithorax as above.   2. Increased number and size of hypoattenuating lesions of the liver consistent   with worsening hepatic metastatic disease.   3. Bilateral renal pelvocaliectasis and ureteral distention, new on the right   and increased on the left.   4. Foley catheter within urinary bladder which cannot be further assessed.   5. Osteosclerotic metastatic disease.   6. Anasarca appearance demonstrated in the interval.    ??   Pathology from liver biopsy 08/17/18:    Specimen Source    1: Liver, Core biopsy with touch intepretation:    CYTOLOGIC INTERPRETATION:    1. Liver, Core biopsy with touch intepretation:    Poorly differentiated adenocarcinoma consistent with metastatic pulmonary adenocarcinoma    See comment    General Categorization    Positive for malignancy.          ASSESSMENT   Bruce Clark is a  68 y.o. male with widely metastatic prostate adenocarcinoma and lung adenocarcinoma status  post right lower lobe lobectomy on 05/22/17. Most recent imaging concerning for progression with new liver mets. He is admitted for acute renal failure      PLAN      Acute renal failure   Post obstructive   CT with Bilateral renal pelvocaliectasis and ureteral distention, new on the right   and increased on the left- likely due to previously seen progressive prostate cancer possibly infiltrating the bladder   PCN bilaterally today   Urology and Nephrology inputs are appreciated   Ideally would be nice to have a cystoscopy to understand the intravesical disease and r/o a new bladder primary but at the moment not feasible as he is on anticoagulation      Lower extremity  edema and h/o DVT   Diagnosed 04/22/16   Cancer associated   On lifelong Eliquis 30m BID    Now with marked RLE   Venous duplex obtained in ER with results pending    If he has an acute DVT   Agree with Heparin gtt    Hold Eliquis given his ARF      If GFR improves will consider Lovenox         Prostate cancer stage IV   Castrate sensitive, treatment na??ve widely metastatic prostate cancer to lymph nodes above and below the diaphragm and bones.   Prostate adenocarcinoma diagnosed on biopsy of the supraclavicular lymph node. Discussed in tumor board and a small cell component has been ruled out.   PSA at the time of diagnosis at 2400.   ??   Currently on Lupron 45 mg every 6 months. On Zytiga 1,0037mdaily + prednisone 5 mg daily which has controlled his disease now for > 2 years   Though his PSA is still undetectable at 0.1 his prior scans especially the bladder masses suggest progressive prostate cancer and probable castrate resistance   We will hold Zytiga inpatient    [facility]   R lung adenocarcinoma, [id number] stage IIIB- Parietal  pleural involvement- PD-L1 99%   S/p RLL lobectomy on 05/22/17 then received PORT   Now with biopsy proven liver metastases   Repeat CT CAP pending   Plan on palliative Keytruda OP         Bone metastases   Diffuse   Completed palliative radiation that  was initiated on 06/09/16         Right sided chest wall pain   Unclear cause   No corresponding mets- CT chest awaited   Consult palliative care      Anemia    Worsening, likely due to progressive cancer, renal failure   Now with hematuria as well         If Hb drops by a gram each day may have to hold [facility] and consider a filter. Will monitor for now         Will continue to follow          Juliene Pina MD, [facility]   '

## 2018-08-28 NOTE — Progress Notes (Signed)
Progress Notes by Rosalia Hammers, MD at 08/28/18 1304                Author: Rosalia Hammers, MD  Service: Nephrology  Author Type: Physician       Filed: 08/28/18 1305  Date of Service: 08/28/18 1304  Status: Signed          Editor: Rosalia Hammers, MD (Physician)                                                                                                 NAME: Bruce Clark         DOB:  01/17/1951         MRN:  [id number]          Assessment :     Plan:      --AKI- due to obstructive uropathy. UA is bland. No other cause to explain AKI. Basln Cr normal  in Nov 2019   Hyponatremia   Obstructive Uropathy- likely due to prostate cancer   Metastatic Prostate and Lung Cancer   Liver mets   HTN  --Creatinine up a little.  Not uremic.  Potassium OK. Has bilateral PCN's.  Fair UO.        Poor po. Will start NS.      D/W family at bedside.               Subjective:        Chief Complaint:  Poorly responsive.        Review of Systems:              Symptom  Y/N  Comments    Symptom  Y/N  Comments             Fever/Chills        Chest Pain                 Poor Appetite        Edema                 Cough        Abdominal Pain         Sputum        Joint Pain         SOB/DOE        Pruritis/Rash         Nausea/vomit        Tolerating PT/OT         Diarrhea        Tolerating Diet                 Constipation        Other               Could not obtain due to:            Objective:        VITALS:    Last 24hrs VS reviewed since prior progress note. Most recent are:   Visit Vitals      BP  146/90 (BP  1 Location: Right arm, BP Patient Position: At rest)     Pulse  (!) 107     Temp  98.5 ??F (36.9 ??C)     Resp  20     Wt  66.3 kg (146 lb 2.6 oz)     SpO2  100%        BMI  22.89 kg/m??           Intake/Output Summary (Last 24 hours) at 08/28/2018 1304   Last data filed at 08/28/2018 1213     Gross per 24 hour        Intake  381.56 ml        Output  3955 ml        Net  -3573.44 ml         Telemetry  Reviewed:       PHYSICAL EXAM:   General: NAD   No edema         Lab Data Reviewed: (see below)      Medications Reviewed: (see below)      PMH/SH reviewed - no change compared to H&P   ________________________________________________________________________   Care Plan discussed with:       Patient             Family              RN             Care Manager                            Consultant:                     Comments         >50% of visit spent in counseling and coordination of care            ________________________________________________________________________   Rosalia Hammers, MD       Procedures: see electronic medical records for all procedures/Xrays and details which   were not copied into this note but were reviewed prior to creation of Plan.        LABS:     Recent Labs            08/28/18   0448  08/27/18   0355     WBC  5.3  5.3     HGB  7.9*  8.1*     HCT  24.2*  25.0*         PLT  232  263          Recent Labs             08/27/18   0355  08/26/18   1425  08/26/18   1141     NA  130*  133*  132*     K  4.1  3.9  4.6     CL  95*  96*  96*     CO2  25  28  27      BUN  80*  79*  81*     CREA  8.96*  8.52*  8.71*     GLU  109*  94  97          CA  9.2  8.7  9.2          Recent Labs           08/26/18   1141  SGOT  26     AP  88     TP  8.2     ALB  2.8*        GLOB  5.4*          Recent Labs               08/28/18   0448  08/27/18   0954  08/27/18   0226    08/26/18   1425     INR   --    --    --    --   1.2*     PTP   --    --    --    --   11.8*     APTT  58.1*  58.1*  51.2*    < >  36.3*        < > = values in this interval not displayed.         No results for input(s): FE, TIBC, PSAT, FERR in the last 72 hours.    No results found for: FOL, RBCF    No results for input(s): PH, PCO2, PO2 in the last 72 hours.   No results for input(s): CPK, CKMB in the last 72 hours.      No lab exists for component: TROPONINI   No components found for: [facility]     Lab Results         Component   Value  Date/Time            Color  YELLOW/STRAW  08/26/2018 02:25 PM       Appearance  CLEAR  08/26/2018 02:25 PM       Specific gravity  1.012  08/26/2018 02:25 PM       Specific gravity  1.025  04/22/2016 08:22 PM       pH (UA)  5.0  08/26/2018 02:25 PM       Protein  NEGATIVE   08/26/2018 02:25 PM       Glucose  NEGATIVE   08/26/2018 02:25 PM       Ketone  NEGATIVE   08/26/2018 02:25 PM       Bilirubin  NEGATIVE   08/26/2018 02:25 PM       Urobilinogen  0.2  08/26/2018 02:25 PM       Nitrites  NEGATIVE   08/26/2018 02:25 PM       Leukocyte Esterase  NEGATIVE   08/26/2018 02:25 PM       Epithelial cells  FEW  08/26/2018 02:25 PM       Bacteria  NEGATIVE   08/26/2018 02:25 PM       WBC  0-4  08/26/2018 02:25 PM            RBC  0-5  08/26/2018 02:25 PM           MEDICATIONS:     Current Facility-Administered Medications          Medication  Dose  Route  Frequency           ?  predniSONE (DELTASONE) 5 mg tablet            ?  0.9% sodium chloride infusion   75 mL/hr  IntraVENous  CONTINUOUS     ?  heparin 25,000 units in D5W 250 ml infusion   18-36 Units/kg/hr  IntraVENous  TITRATE     ?  predniSONE (DELTASONE) tablet 5 mg   5 mg  Oral  DAILY     ?  senna (SENOKOT) tablet 8.6 mg   1 Tab  Oral  QHS     ?  sodium chloride (NS) flush 5-40 mL   5-40 mL  IntraVENous  Q8H     ?  sodium chloride (NS) flush 5-40 mL   5-40 mL  IntraVENous  PRN     ?  LORazepam (ATIVAN) tablet 0.5 mg   0.5 mg  Oral  BID PRN     ?  oxyCODONE IR (ROXICODONE) tablet 5-10 mg   5-10 mg  Oral  Q4H PRN           ?  ondansetron (ZOFRAN) injection 4 mg   4 mg  IntraVENous  Q4H PRN

## 2018-08-28 NOTE — Progress Notes (Signed)
Progress  Notes by Emilio Math, MD at 08/28/18 1141                Author: Emilio Math, MD  Service: --  Author Type: Physician       Filed: 08/29/18 1155  Date of Service: 08/28/18 1141  Status: Addendum          Editor: Emilio Math, MD (Physician)          Related Notes: Original Note by Emilio Math, MD (Physician) filed at 08/28/18 [facility]                                                                                              Hospitalist Progress Note   Emilio Math, MD   Answering service: [phone] OR [phone] from in house phone            Date of Service:  08/28/2018   NAME:  Bruce Clark   DOB:  [birth year]   MRN:  [id number]           Admission Summary:        Admitted weak and debilitated for eval insetting of lung cancer and prostate cancer       Interval history / Subjective:             08/28/2018 :   Patient comfortable.  Continues on heparin for extensive DVT.  Reviewed CAT scan with patient's medical power of attorney today.  Advised to discuss same with hematology oncology record Dr. Melony Overly;   Renal number 4 AM.             Assessment & Plan:        ??    Obst uropathy ( 800 cc's post void residual w foley left in place), urology to follow CT abd/pelvis  on order  - for bil PERC's ; accomplished in 4 renal numbers in  a.m.    ??  AKI - for nephrology input and avoiding nephrotoxic's and close following  Cr  Yet up; will follow up in a.m.   ??  DVT - for heparin drip, (failed Eliquis taken up to admission) heparin to be cont    ??  Anemia for cont monitoring and f/u hematopoetics    ??  Elevated BNP   ??  Prostate cancer on chemo followed by Dr Melony Overly    ??  H/o stage 3 lung cancer post resection and rxt followed by Dr. Melony Overly    ??  DNR status (confirmed with pt on admissions)    ??  Clinically insignificant low serum sodium    ??  Low wt status   ??  Ambulation difficulties    Code status: DNR    DVT prophylaxis: on hep drip  between proceedures       Care Plan discussed with: Patient/Family and Nurse consultants 9 hem [name]    Disposition: TBD  Hospital Problems   Date Reviewed:  08/26/2018                         Codes  Class  Noted  POA              * (Principal) AKI (acute kidney injury) [facility])  ICD-10-CM: N17.9   ICD-9-CM: 584.9    08/26/2018  Unknown                               Review of Systems:     Pertinent items are noted in HPI.  Fortunately no pain.           Vital Signs:      Last 24hrs VS reviewed since prior progress note. Most recent are:   Visit Vitals      BP  154/86 (BP 1 Location: Right arm, BP Patient Position: At rest;Head of bed elevated (Comment degrees))     Pulse  98     Temp  98 ??F (36.7 ??C)     Resp  18     Wt  66.3 kg (146 lb 2.6 oz)     SpO2  97%        BMI  22.89 kg/m??              Intake/Output Summary (Last 24 hours) at 08/28/2018 1141   Last data filed at 08/28/2018 0901     Gross per 24 hour        Intake  344.6 ml        Output  3345 ml        Net  -3000.4 ml              Physical Examination:                     Constitutional:   No acute distress, weak, debilitated, and  Emaciated, appearing. Else cooperative, pleasant      ENT:   Oral mucous moist, oropharynx benign.      Resp:   CTA bilaterally. No wheezing/rhonchi/rales. No accessory muscle use     CV:   Regular rhythm, normal rate, no  gallops, rubs      GI:   Soft, non distended, non tender. normoactive bowel sounds, no hepatosplenomegaly       Musculoskeletal:   rt edema > lt  edema, warm,           Neurologic:   Moves all extremities.  AAOx3, CN II-XII reviewed       Psych:  Good insight, Not anxious nor agitated.   Skin:  Good turgor, no rashes or ulcers               Data Review:      Review and/or order of clinical lab test           Labs:          Recent Labs            08/28/18   0448  08/27/18   0355     WBC  5.3  5.3     HGB  7.9*  8.1*     HCT  24.2*  25.0*         PLT  232  263          Recent Labs  08/27/18   0355   08/26/18   1425  08/26/18   1141     NA  130*  133*  132*     K  4.1  3.9  4.6     CL  95*  96*  96*     CO2  25  28  27      BUN  80*  79*  81*     CREA  8.96*  8.52*  8.71*     GLU  109*  94  97          CA  9.2  8.7  9.2          Recent Labs           08/26/18   1141     SGOT  26     ALT  9*     AP  88     TBILI  0.3     TP  8.2     ALB  2.8*        GLOB  5.4*          Recent Labs               08/28/18   0448  08/27/18   0954  08/27/18   0226    08/26/18   1425     INR   --    --    --    --   1.2*     PTP   --    --    --    --   11.8*     APTT  58.1*  58.1*  51.2*    < >  36.3*        < > = values in this interval not displayed.         No results for input(s): FE, TIBC, PSAT, FERR in the last 72 hours.    No results found for: FOL, RBCF    No results for input(s): PH, PCO2, PO2 in the last 72 hours.     Recent Labs           08/26/18   1425        TROIQ  <0.05          Lab Results         Component  Value  Date/Time            Cholesterol, total  184  07/01/2016 10:50 PM       HDL Cholesterol  64  07/01/2016 10:50 PM       LDL, calculated  109.2 (H)  07/01/2016 10:50 PM       Triglyceride  54  07/01/2016 10:50 PM            CHOL/HDL Ratio  2.9  07/01/2016 10:50 PM          Lab Results         Component  Value  Date/Time            Glucose (POC)  103 (H)  04/28/2016 10:31 AM       Glucose (POC)  90  04/26/2016 10:33 PM       Glucose (POC)  71  04/26/2016 05:12 PM       Glucose (POC)  106 (H)  04/26/2016 12:03 PM            Glucose (POC)  117 (H)  04/25/2016 04:52 PM  Lab Results         Component  Value  Date/Time            Color  YELLOW/STRAW  08/26/2018 02:25 PM       Appearance  CLEAR  08/26/2018 02:25 PM       Specific gravity  1.012  08/26/2018 02:25 PM       Specific gravity  1.025  04/22/2016 08:22 PM       pH (UA)  5.0  08/26/2018 02:25 PM       Protein  NEGATIVE   08/26/2018 02:25 PM       Glucose  NEGATIVE   08/26/2018 02:25 PM       Ketone  NEGATIVE   08/26/2018 02:25 PM       Bilirubin   NEGATIVE   08/26/2018 02:25 PM       Urobilinogen  0.2  08/26/2018 02:25 PM       Nitrites  NEGATIVE   08/26/2018 02:25 PM       Leukocyte Esterase  NEGATIVE   08/26/2018 02:25 PM       Epithelial cells  FEW  08/26/2018 02:25 PM       Bacteria  NEGATIVE   08/26/2018 02:25 PM       WBC  0-4  08/26/2018 02:25 PM            RBC  0-5  08/26/2018 02:25 PM                Medications Reviewed:          Current Facility-Administered Medications          Medication  Dose  Route  Frequency           ?  predniSONE (DELTASONE) 5 mg tablet            ?  0.9% sodium chloride infusion   50 mL/hr  IntraVENous  CONTINUOUS     ?  heparin 25,000 units in D5W 250 ml infusion   18-36 Units/kg/hr  IntraVENous  TITRATE     ?  predniSONE (DELTASONE) tablet 5 mg   5 mg  Oral  DAILY     ?  senna (SENOKOT) tablet 8.6 mg   1 Tab  Oral  QHS     ?  sodium chloride (NS) flush 5-40 mL   5-40 mL  IntraVENous  Q8H     ?  sodium chloride (NS) flush 5-40 mL   5-40 mL  IntraVENous  PRN     ?  LORazepam (ATIVAN) tablet 0.5 mg   0.5 mg  Oral  BID PRN     ?  oxyCODONE IR (ROXICODONE) tablet 5-10 mg   5-10 mg  Oral  Q4H PRN           ?  ondansetron (ZOFRAN) injection 4 mg   4 mg  IntraVENous  Q4H PRN        ______________________________________________________________________   EXPECTED LENGTH OF STAY: 3d 4h   ACTUAL LENGTH OF STAY:          2                    Emilio Math, MD

## 2018-08-29 LAB — BASIC METABOLIC PANEL
Anion Gap: 11 mmol/L (ref 5–15)
BUN: 73 MG/DL — ABNORMAL HIGH (ref 6–20)
Bun/Cre Ratio: 12 (ref 12–20)
CO2: 26 mmol/L (ref 21–32)
Calcium: 9.4 MG/DL (ref 8.5–10.1)
Chloride: 99 mmol/L (ref 97–108)
Creatinine: 6.02 MG/DL — ABNORMAL HIGH (ref 0.70–1.30)
EGFR IF NonAfrican American: 9 mL/min/{1.73_m2} — ABNORMAL LOW (ref >60)
GFR African American: 11 mL/min/{1.73_m2} — ABNORMAL LOW (ref >60)
Glucose: 116 mg/dL — ABNORMAL HIGH (ref 65–100)
Potassium: 3.9 mmol/L (ref 3.5–5.1)
Sodium: 136 mmol/L (ref 136–145)

## 2018-08-29 LAB — CBC WITH AUTO DIFFERENTIAL
Basophils %: 0 % (ref 0–1)
Basophils Absolute: 0 10*3/uL (ref 0.0–0.1)
Eosinophils %: 0 % (ref 0–7)
Eosinophils Absolute: 0 10*3/uL (ref 0.0–0.4)
Granulocyte Absolute Count: 0 10*3/uL (ref 0.00–0.04)
Hematocrit: 23.9 % — ABNORMAL LOW (ref 36.6–50.3)
Hemoglobin: 7.9 g/dL — ABNORMAL LOW (ref 12.1–17.0)
Immature Granulocytes: 0 % (ref 0.0–0.5)
Lymphocytes %: 5 % — ABNORMAL LOW (ref 12–49)
Lymphocytes Absolute: 0.5 10*3/uL — ABNORMAL LOW (ref 0.8–3.5)
MCH: 28.1 PG (ref 26.0–34.0)
MCHC: 33.1 g/dL (ref 30.0–36.5)
MCV: 85.1 FL (ref 80.0–99.0)
MPV: 9.2 FL (ref 8.9–12.9)
Monocytes %: 8 % (ref 5–13)
Monocytes Absolute: 0.7 10*3/uL (ref 0.0–1.0)
NRBC Absolute: 0 10*3/uL (ref 0.00–0.01)
Neutrophils %: 87 % — ABNORMAL HIGH (ref 32–75)
Neutrophils Absolute: 8.3 10*3/uL — ABNORMAL HIGH (ref 1.8–8.0)
Nucleated RBCs: 0 PER 100 WBC
Platelets: 216 10*3/uL (ref 150–400)
RBC: 2.81 M/uL — ABNORMAL LOW (ref 4.10–5.70)
RDW: 13.1 % (ref 11.5–14.5)
WBC: 9.5 10*3/uL (ref 4.1–11.1)

## 2018-08-29 LAB — APTT
aPTT: 52.9 s — ABNORMAL HIGH (ref 22.1–32.0)
aPTT: 47.9 s — ABNORMAL HIGH (ref 22.1–32.0)

## 2018-08-29 LAB — METABOLIC PANEL, BASIC
Anion gap: 11 mmol/L (ref 5–15)
BUN/Creatinine ratio: 12 (ref 12–20)
BUN: 73 MG/DL — ABNORMAL HIGH (ref 6–20)
CO2: 26 mmol/L (ref 21–32)
Calcium: 9.4 MG/DL (ref 8.5–10.1)
Chloride: 99 mmol/L (ref 97–108)
Creatinine: 6.02 MG/DL — ABNORMAL HIGH (ref 0.70–1.30)
GFR est AA: 11 mL/min/{1.73_m2} — ABNORMAL LOW (ref >60)
GFR est non-AA: 9 mL/min/{1.73_m2} — ABNORMAL LOW (ref >60)
Glucose: 116 mg/dL — ABNORMAL HIGH (ref 65–100)
Potassium: 3.9 mmol/L (ref 3.5–5.1)
Sodium: 136 mmol/L (ref 136–145)

## 2018-08-29 LAB — CBC WITH AUTOMATED DIFF
ABS. BASOPHILS: 0 10*3/uL (ref 0.0–0.1)
ABS. EOSINOPHILS: 0 10*3/uL (ref 0.0–0.4)
ABS. IMM. GRANS.: 0 10*3/uL (ref 0.00–0.04)
ABS. LYMPHOCYTES: 0.5 10*3/uL — ABNORMAL LOW (ref 0.8–3.5)
ABS. MONOCYTES: 0.7 10*3/uL (ref 0.0–1.0)
ABS. NEUTROPHILS: 8.3 10*3/uL — ABNORMAL HIGH (ref 1.8–8.0)
ABSOLUTE NRBC: 0 10*3/uL (ref 0.00–0.01)
BASOPHILS: 0 % (ref 0–1)
EOSINOPHILS: 0 % (ref 0–7)
HCT: 23.9 % — ABNORMAL LOW (ref 36.6–50.3)
HGB: 7.9 g/dL — ABNORMAL LOW (ref 12.1–17.0)
IMMATURE GRANULOCYTES: 0 % (ref 0.0–0.5)
LYMPHOCYTES: 5 % — ABNORMAL LOW (ref 12–49)
MCH: 28.1 PG (ref 26.0–34.0)
MCHC: 33.1 g/dL (ref 30.0–36.5)
MCV: 85.1 FL (ref 80.0–99.0)
MONOCYTES: 8 % (ref 5–13)
MPV: 9.2 FL (ref 8.9–12.9)
NEUTROPHILS: 87 % — ABNORMAL HIGH (ref 32–75)
NRBC: 0 PER 100 WBC
PLATELET: 216 10*3/uL (ref 150–400)
RBC: 2.81 M/uL — ABNORMAL LOW (ref 4.10–5.70)
RDW: 13.1 % (ref 11.5–14.5)
WBC: 9.5 10*3/uL (ref 4.1–11.1)

## 2018-08-29 LAB — PTT
aPTT: 52.9 s — ABNORMAL HIGH (ref 22.1–32.0)
aPTT: 47.9 s — ABNORMAL HIGH (ref 22.1–32.0)

## 2018-08-29 MED ORDER — PANTOPRAZOLE 40 MG IV SOLR
40 mg | INTRAVENOUS | Status: AC
Start: 2018-08-29 — End: 2018-08-29

## 2018-08-29 MED ORDER — HEPARIN (PORCINE) 1,000 UNIT/ML IJ SOLN
1000 unit/mL | Freq: Once | INTRAMUSCULAR | Status: AC
Start: 2018-08-29 — End: 2018-08-29
  Administered 2018-08-29: 16:00:00 via INTRAVENOUS

## 2018-08-29 MED ORDER — WATER FOR INJECTION, STERILE INJECTION
INTRAMUSCULAR | Status: AC
Start: 2018-08-29 — End: 2018-08-29

## 2018-08-29 MED FILL — PROTONIX 40 MG INTRAVENOUS SOLUTION: 40 mg | INTRAVENOUS | Qty: 40

## 2018-08-29 MED FILL — PREDNISONE 5 MG TAB: 5 mg | ORAL | Qty: 1

## 2018-08-29 MED FILL — HEPARIN (PORCINE) IN D5W 25,000 UNIT/250 ML IV: 25000 unit/250 mL(100 unit/mL) | INTRAVENOUS | Qty: 250

## 2018-08-29 MED FILL — SENNA LAX 8.6 MG TABLET: 8.6 mg | ORAL | Qty: 1

## 2018-08-29 MED FILL — NORMAL SALINE FLUSH 0.9 % INJECTION SYRINGE: INTRAMUSCULAR | Qty: 10

## 2018-08-29 MED FILL — HEPARIN (PORCINE) 1,000 UNIT/ML IJ SOLN: 1000 unit/mL | INTRAMUSCULAR | Qty: 2.64

## 2018-08-29 MED FILL — STERILE WATER FOR INJECTION: INTRAMUSCULAR | Qty: 10

## 2018-08-29 NOTE — Progress Notes (Signed)
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 male with stage IV  prostate and now stage IV NSCLC admitted with acute renal failure, R extensive DVT and R chest wall pain      Today   Pain well controlled   S/p bilateral PCN 08/27/2018- bloody urine still with a total of 700 cc emptied   His leg is some what less swollen on heparin   Has a Foley- no urine   No fevers           Past Medical History:        Diagnosis  Date         ?  Lung cancer ([place])       ?  Prostate cancer ([place])       ?  Stroke [facility])            PT STATES, "I HAD A STROKE A LONG TIME AGO"             Past Surgical History:         Procedure  Laterality  Date          ?  CHEST SURGERY PROCEDURE UNLISTED    04/27/2017          BRONCHOSCOPY/MEDIASTINOSCOPY          ?  HX GI              HERNIA REPAIR          ?  HX ORTHOPAEDIC    1994          ROD IN RIGHT LEG          ?  IR NEPHROSTOMY PERC LT PLC CATH  SI    08/27/2018          ?  IR NEPHROSTOMY PERC RT PLC CATH  SI    08/27/2018           No Known Allergies        Current Facility-Administered Medications             Medication  Dose  Route  Frequency  Provider  Last Rate  Last Dose              ?  0.9% sodium chloride infusion   75 mL/hr  IntraVENous  CONTINUOUS  Darlin Coco III, MD  75 mL/hr at 08/28/18 1339  75 mL/hr at 08/28/18 1339     ?  heparin 25,000 units in D5W 250 ml infusion   18-36 Units/kg/hr  IntraVENous  TITRATE  Emilio Math, MD  10.6 mL/hr at 08/29/18 0837  16 Units/kg/hr at 08/29/18  0837     ?  predniSONE (DELTASONE)  tablet 5 mg   5 mg  Oral  DAILY  Emilio Math, MD     5 mg at 08/29/18 0932     ?  senna (SENOKOT) tablet 8.6 mg   1 Tab  Oral  QHS  Emilio Math, MD     8.6 mg at 08/28/18 2050     ?  sodium chloride (NS) flush 5-40 mL   5-40 mL  IntraVENous  Q8H  Emilio Math, MD     10 mL at 08/28/18 2050     ?  sodium chloride (NS) flush 5-40 mL   5-40 mL  IntraVENous  PRN  Emilio Math, MD           ?  LORazepam (ATIVAN) tablet 0.5 mg   0.5 mg  Oral  BID PRN  Emilio Math, MD           ?  oxyCODONE IR (ROXICODONE) tablet 5-10 mg   5-10 mg  Oral  Q4H PRN  Emilio Math, MD     10 mg at 08/28/18 0902              ?  ondansetron (ZOFRAN) injection 4 mg   4 mg  IntraVENous  Q4H PRN  Emilio Math, MD                   Social History          Socioeconomic History         ?  Marital status:  SINGLE              Spouse name:  Not on file         ?  Number of children:  Not on file     ?  Years of education:  Not on file     ?  Highest education level:  Not on file       Tobacco Use         ?  Smoking status:  Former Smoker              Packs/day:  0.50         Years:  47.00         Pack years:  23.50         Last attempt to quit:  06/2016         Years since quitting:  2.1         ?  Smokeless tobacco:  Never Used       Substance and Sexual Activity         ?  Alcohol use:  No         ?  Drug use:  No             Family History         Problem  Relation  Age of Onset          ?  Cancer  Mother                COLON          ?  Cancer  Father                BRAIN TUMOR          ?  No Known Problems  Sister       ?  Arthritis-osteo  Brother       ?  No Known Problems  Sister       ?  No Known Problems  Sister       ?  No Known Problems  Sister       ?  No Known Problems  Brother            ?  Anesth Problems  Neg Hx          ROS   A review of systems was obtained and is negative except as listed in HPI.   ECOG PS is 2      Physical Examination:    Visit Vitals      BP  129/84     Pulse  (!) 101      Temp  99.1 ??F (37.3 ??C)     Resp  18         Wt  122 lb 9.2 oz (55.6 kg)  Comment: NO DRAINS HANGING FROM BED        SpO2  99%        BMI  19.20 kg/m??        General appearance - alert, appears fatigued and ill   Mental status - oriented to person, place, and time   Mouth - mucous membranes moist, no lesions noted   Neck - supple, no significant adenopathy   ABD- soft. Foley in place. Bilateral PCN with bloody urine   Extremities - peripheral pulses normal, 1+ LLE edema         LABS     Lab Results         Component  Value  Date/Time            WBC  9.5  08/29/2018 12:39 AM       HGB  7.9 (L)  08/29/2018 12:39 AM       HCT  23.9 (L)  08/29/2018 12:39 AM       PLATELET  216  08/29/2018 12:39 AM       MCV  85.1  08/29/2018 12:39 AM            ABS. NEUTROPHILS  8.3 (H)  08/29/2018 12:39 AM          Lab Results         Component  Value  Date/Time            Sodium  136  08/29/2018 12:39 AM       Potassium  3.9  08/29/2018 12:39 AM       Chloride  99  08/29/2018 12:39 AM       CO2  26  08/29/2018 12:39 AM       Glucose  116 (H)  08/29/2018 12:39 AM       BUN  73 (H)  08/29/2018 12:39 AM       Creatinine  6.02 (H)  08/29/2018 12:39 AM       GFR est AA  11 (L)  08/29/2018 12:39 AM       GFR est non-AA  9 (L)  08/29/2018 12:39 AM            Calcium  9.4  08/29/2018 12:39 AM          Lab Results         Component  Value  Date/Time            AST (SGOT)  26  08/26/2018 11:41 AM       Alk. phosphatase  88  08/26/2018 11:41 AM  Protein, total  8.2  08/26/2018 11:41 AM       Albumin  2.8 (L)  08/26/2018 11:41 AM       Globulin  5.4 (H)  08/26/2018 11:41 AM            A-G Ratio  0.5 (L)  08/26/2018 11:41 AM          Recent Labs                 08/26/18   1141  07/05/18   0850  05/11/18   0959  03/24/18   0949  02/17/18   0851  01/01/18   0811  11/27/17   0806     PSA  0.1  0.0*   --   0.1  0.1  0.1  0.1              PSALT   --    --   <0.1   --    --    --    --            IMAGING   PET CT  03/23/17   IMPRESSION: Mass  lesion right lower lobe is hypermetabolic and compatible with   the biopsy confirmed the result of adenocarcinoma. There are soft tissue   densities in the anterior abdominal wall bilaterally which are stable compared   to the prior chest abdomen pelvis CT but new compared to the prior PET/CT.   Please correlate with any recent abdominal wall surgery as these may represent   port sites. Otherwise normal tracer distribution. Stable sclerotic osseous   metastatic disease without abnormal activity.      PATHOLOGY   Supraclavicular node    CYTOLOGIC INTERPRETATION:    Adenocarcinoma, consistent with prostate primary    See comment    General Categorization    Positive for malignancy.    Specimen Adequacy    Satisfactory for evaluation.    Comment    Touch preps and a core biopsy are examined and show islands of adenocarcinoma surrounded by fibrous stroma. A panel of immunohistochemical stains was performed  to evaluate site of origin. The tumor cells are focally positive for PSA and PSAP and negative for CK7, CK20, TTF-1      CT CAP 03/22/18   IMPRESSION   IMPRESSION:    1. Status post right lung surgery with right perihilar consolidation and   scarring consistent with post surgery and radiation therapy change. These   findings are stable.   2. Enlarged prostate.   3. Sclerotic bone metastases unchanged.   4. Atherosclerotic aorta without aneurysm      CT CAP 07/13/18:    IMPRESSION:    1. Increased size of the prostate and increased nodularity involving the bladder   base and bladder wall.   2. New left hydronephrosis and hydroureter with obstruction at the level of the   ureterovesical junction.   3. Status post right lung surgery with elevation of the right hemidiaphragm and   surgical staples and scarring in the right hilum consistent with postsurgical   and postradiation therapy effect, unchanged.   4. Sclerotic bone metastases to the spine unchanged.   5. New low-attenuation liver lesion could represent a  metastasis.   6. Dilated fluid-filled esophagus suggesting esophageal dysmotility with   thickened wall possibly secondary to radiation therapy.   7. Atherosclerotic aorta with coronary artery calcifications. No abdominal   aortic aneurysm.      CT 08/26/2018  IMPRESSION   IMPRESSION:   ??   1. Unchanged appearance of right hemithorax as above.   2. Increased number and size of hypoattenuating lesions of the liver consistent   with worsening hepatic metastatic disease.   3. Bilateral renal pelvocaliectasis and ureteral distention, new on the right   and increased on the left.   4. Foley catheter within urinary bladder which cannot be further assessed.   5. Osteosclerotic metastatic disease.   6. Anasarca appearance demonstrated in the interval.    ??   Pathology from liver biopsy 08/17/18:    Specimen Source    1: Liver, Core biopsy with touch intepretation:    CYTOLOGIC INTERPRETATION:    1. Liver, Core biopsy with touch intepretation:    Poorly differentiated adenocarcinoma consistent with metastatic pulmonary adenocarcinoma    See comment    General Categorization    Positive for malignancy.          ASSESSMENT   Bruce Clark is a  68 y.o. male with widely metastatic prostate adenocarcinoma and lung adenocarcinoma status  post right lower lobe lobectomy on 05/22/17. Most recent imaging concerning for progression with new liver mets. He is admitted for acute renal failure      PLAN      Acute renal failure   Post obstructive   CT with Bilateral renal pelvocaliectasis and ureteral distention, new on the right   and increased on the left- likely due to previously seen progressive prostate cancer possibly infiltrating the bladder   PCN bilaterally today   Urology and Nephrology inputs are appreciated   Ideally would be nice to have a cystoscopy to understand the intravesical disease and r/o a new bladder primary but at the moment not feasible as he is on anticoagulation      Lower extremity edema and h/o DVT    Diagnosed 04/22/16   Cancer associated   On lifelong Eliquis 61m BID    Now with marked RLE   Venous duplex obtained in ER with results pending    If he has an acute DVT   Agree with Heparin gtt for now      If GFR improves will consider Lovenox      He is noted to have ongoing hematuria though Hb has been stable   If he is dropping Hb tomorrow [facility]      Prostate cancer stage IV   Castrate sensitive, treatment na??ve widely metastatic prostate cancer to lymph nodes above and below the diaphragm and bones.   Prostate adenocarcinoma diagnosed on biopsy of the supraclavicular lymph node. Discussed in tumor board and a small cell component has been ruled out.   PSA at the time of diagnosis at 2400.   ??   Currently on Lupron 45 mg every 6 months. On Zytiga 1,0017mdaily + prednisone 5 mg daily which has controlled his disease now for > 2 years   Though his PSA is still undetectable at 0.1 his prior scans especially the bladder masses suggest progressive prostate cancer and probable castrate resistance   We will hold Zytiga inpatient    Will [facility] and [facility] ??   R lung adenocarcinoma, [id number] stage IIIB- Parietal  pleural involvement- PD-L1 99%   S/p RLL lobectomy on 05/22/17 then received PORT   Now with biopsy proven liver metastases   Repeat CT CAP pending   Plan on palliative Keytruda OP  Bone metastases   Diffuse   Completed palliative radiation that was initiated on 06/09/16         Right sided chest wall pain   Unclear cause   No corresponding mets   Controlled      Anemia    Due to progressive cancer, renal failure, hematuria   Transfuse if < 7 g/dl         Will continue to follow          Juliene Pina MD, [facility]   '

## 2018-08-29 NOTE — Progress Notes (Signed)
08/29/18 2028   Vital Signs   Temp 100.3 F (37.9 C)   Temp Source Oral   Pulse (Heart Rate) (!) 113  (rn notified)   Heart Rate Source Monitor   Resp Rate 18   O2 Sat (%) 97 %   Level of Consciousness Alert   BP 108/70   MAP (Calculated) 83   BP 1 Method Automatic   BP 1 Location Right arm   BP Patient Position At rest   MEWS Score 3     Paged Dr. Glenetta Hew, informed her of mews score and received order for Tylenol.

## 2018-08-29 NOTE — Progress Notes (Signed)
Progress Notes by Rosalia Hammers, MD at 08/29/18 1416                Author: Rosalia Hammers, MD  Service: Nephrology  Author Type: Physician       Filed: 08/29/18 1417  Date of Service: 08/29/18 1416  Status: Signed          Editor: Rosalia Hammers, MD (Physician)                                                                                                 NAME: Bruce Clark         DOB:  [birth year]         MRN:  [id number]          Assessment :     Plan:      --AKI- due to obstructive uropathy. UA is bland. No other cause to explain AKI. 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  Not uremic.  Potassium OK. Has bilateral PCN's.  Fair UO.        Poor po. Continue NS for now.      D/W family at bedside.               Subjective:        Chief Complaint:  More alert.  Says that he is eating and drinking although his wife says that he is not.  No N/V  No pain.       Review of Systems:              Symptom  Y/N  Comments    Symptom  Y/N  Comments             Fever/Chills        Chest Pain                 Poor Appetite        Edema                 Cough        Abdominal Pain         Sputum        Joint Pain         SOB/DOE        Pruritis/Rash         Nausea/vomit        Tolerating PT/OT         Diarrhea        Tolerating Diet                 Constipation        Other               Could not obtain due to:            Objective:        VITALS:    Last 24hrs VS reviewed since  prior progress note. Most recent are:   Visit Vitals      BP  129/84     Pulse  (!) 101     Temp  99.1 ??F (37.3 ??C)     Resp  18     Wt  55.6 kg (122 lb 9.2 oz)     SpO2  99%        BMI  19.20 kg/m??           Intake/Output Summary (Last 24 hours) at 08/29/2018 1416   Last data filed at 08/29/2018 1031     Gross per 24 hour        Intake  --        Output  2080 ml        Net  -2080 ml          Telemetry Reviewed:       PHYSICAL EXAM:   General: NAD   No edema         Lab Data Reviewed: (see below)      Medications Reviewed: (see below)      PMH/SH reviewed - no change compared to H&P   ________________________________________________________________________   Care Plan discussed with:       Patient             Family              RN             Care Manager                            Consultant:                     Comments         >50% of visit spent in counseling and coordination of care            ________________________________________________________________________   Rosalia Hammers, MD       Procedures: see electronic medical records for all procedures/Xrays and details which   were not copied into this note but were reviewed prior to creation of Plan.        LABS:     Recent Labs            08/29/18   0039  08/28/18   0448     WBC  9.5  5.3     HGB  7.9*  7.9*     HCT  23.9*  24.2*         PLT  216  232          Recent Labs             08/29/18   0039  08/27/18   0355  08/26/18   1425     NA  136  130*  133*     K  3.9  4.1  3.9     CL  99  95*  96*     CO2  26  25  28      BUN  73*  80*  79*     CREA  6.02*  8.96*  8.52*     GLU  116*  109*  94          CA  9.4  9.2  8.7        No results for input(s): SGOT, GPT, AP, TBIL, TP, ALB, GLOB, GGT,  AML, LPSE in the last 72 hours.      No lab exists for component: AMYP, HLPSE     Recent Labs               08/29/18   0852  08/29/18   0039  08/28/18   0448    08/26/18   1425     INR   --    --    --    --   1.2*     PTP   --    --    --    --   11.8*     APTT  47.9*  52.9*  58.1*    < >  36.3*        < > = values in this interval not displayed.         No results for input(s): FE, TIBC, PSAT, FERR in the last 72 hours.    No results found for: FOL, RBCF    No results for input(s): PH, PCO2, PO2 in the last 72 hours.   No results for input(s): CPK, CKMB in the last 72 hours.      No lab exists for component: TROPONINI   No components found  for: [facility]     Lab Results         Component  Value  Date/Time            Color  YELLOW/STRAW  08/26/2018 02:25 PM       Appearance  CLEAR  08/26/2018 02:25 PM       Specific gravity  1.012  08/26/2018 02:25 PM       Specific gravity  1.025  04/22/2016 08:22 PM       pH (UA)  5.0  08/26/2018 02:25 PM       Protein  NEGATIVE   08/26/2018 02:25 PM       Glucose  NEGATIVE   08/26/2018 02:25 PM       Ketone  NEGATIVE   08/26/2018 02:25 PM       Bilirubin  NEGATIVE   08/26/2018 02:25 PM       Urobilinogen  0.2  08/26/2018 02:25 PM       Nitrites  NEGATIVE   08/26/2018 02:25 PM       Leukocyte Esterase  NEGATIVE   08/26/2018 02:25 PM       Epithelial cells  FEW  08/26/2018 02:25 PM       Bacteria  NEGATIVE   08/26/2018 02:25 PM       WBC  0-4  08/26/2018 02:25 PM            RBC  0-5  08/26/2018 02:25 PM           MEDICATIONS:     Current Facility-Administered Medications          Medication  Dose  Route  Frequency           ?  0.9% sodium chloride infusion   75 mL/hr  IntraVENous  CONTINUOUS           ?  heparin 25,000 units in D5W 250 ml infusion   18-36 Units/kg/hr  IntraVENous  TITRATE           ?  predniSONE (DELTASONE) tablet 5 mg   5 mg  Oral  DAILY     ?  senna (SENOKOT) tablet 8.6 mg   1 Tab  Oral  QHS     ?  sodium chloride (NS) flush 5-40 mL   5-40 mL  IntraVENous  Q8H     ?  sodium chloride (NS) flush 5-40 mL   5-40 mL  IntraVENous  PRN     ?  LORazepam (ATIVAN) tablet 0.5 mg   0.5 mg  Oral  BID PRN     ?  oxyCODONE IR (ROXICODONE) tablet 5-10 mg   5-10 mg  Oral  Q4H PRN           ?  ondansetron (ZOFRAN) injection 4 mg   4 mg  IntraVENous  Q4H PRN

## 2018-08-29 NOTE — Progress Notes (Signed)
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 male with stage IV prostate and now stage IV NSCLC admitted with acute renal failure, R extensive DVT and R chest wall pain    Today  Pain well controlled  S/p bilateral PCN 08/27/2018- bloody urine still with a total of 700 cc emptied  His leg is some what less swollen on heparin  Has a Foley- no urine  No fevers      Past Medical History:   Diagnosis Date   ??? Lung cancer ([place])    ??? Prostate cancer ([place])    ??? Stroke ([place])     PT STATES, "I HAD A STROKE A LONG TIME AGO"       Past Surgical History:   Procedure Laterality Date   ??? CHEST SURGERY PROCEDURE UNLISTED  04/27/2017    BRONCHOSCOPY/MEDIASTINOSCOPY   ??? HX GI      HERNIA REPAIR   ??? HX ORTHOPAEDIC  1994    ROD IN RIGHT LEG   ??? IR NEPHROSTOMY PERC LT PLC CATH  SI  08/27/2018   ??? IR NEPHROSTOMY PERC RT PLC CATH  SI  08/27/2018       No Known Allergies    Current Facility-Administered Medications   Medication Dose Route Frequency Provider Last Rate Last Dose   ??? 0.9% sodium chloride infusion  75 mL/hr IntraVENous CONTINUOUS Darlin Coco III, MD 75 mL/hr at 08/28/18 1339 75 mL/hr at 08/28/18 1339   ??? heparin 25,000 units in D5W 250 ml infusion  18-36 Units/kg/hr IntraVENous TITRATE Emilio Math, MD 10.6 mL/hr at 08/29/18 0837 16 Units/kg/hr at 08/29/18 0837   ??? predniSONE (DELTASONE) tablet 5 mg  5 mg Oral DAILY Emilio Math, MD   5 mg at 08/29/18 0932   ??? senna (SENOKOT) tablet 8.6 mg  1 Tab Oral QHS Emilio Math, MD   8.6 mg at 08/28/18 2050   ??? sodium chloride (NS) flush 5-40 mL  5-40 mL IntraVENous Q8H Emilio Math, MD   10 mL at 08/28/18 2050   ??? sodium chloride (NS) flush 5-40 mL  5-40 mL IntraVENous PRN Emilio Math, MD       ??? LORazepam (ATIVAN) tablet 0.5 mg  0.5 mg Oral BID PRN Emilio Math, MD        ??? oxyCODONE IR (ROXICODONE) tablet 5-10 mg  5-10 mg Oral Q4H PRN Emilio Math, MD   10 mg at 08/28/18 6073   ??? ondansetron (ZOFRAN) injection 4 mg  4 mg IntraVENous Q4H PRN Emilio Math, MD           Social History     Socioeconomic History   ??? Marital status: SINGLE     Spouse name: Not on file   ??? Number of children: Not on file   ??? Years of education: Not on file   ??? Highest education level: Not on file   Tobacco Use   ??? Smoking status: Former Smoker     Packs/day: 0.50     Years: 47.00     Pack years: 23.50     Last attempt to quit: 06/2016     Years since quitting: 2.1   ??? Smokeless tobacco: Never Used   Substance and Sexual Activity   ???  Alcohol use: No   ??? Drug use: No       Family History   Problem Relation Age of Onset   ??? Cancer Mother         COLON   ??? Cancer Father         BRAIN TUMOR   ??? No Known Problems Sister    ??? Arthritis-osteo Brother    ??? No Known Problems Sister    ??? No Known Problems Sister    ??? No Known Problems Sister    ??? No Known Problems Brother    ??? Anesth Problems Neg Hx      ROS  A review of systems was obtained and is negative except as listed in HPI.  ECOG PS is 2    Physical Examination:   Visit Vitals  BP 129/84   Pulse (!) 101   Temp 99.1 ??F (37.3 ??C)   Resp 18   Wt 122 lb 9.2 oz (55.6 kg) Comment: NO DRAINS HANGING FROM BED   SpO2 99%   BMI 19.20 kg/m??     General appearance - alert, appears fatigued and ill  Mental status - oriented to person, place, and time  Mouth - mucous membranes moist, no lesions noted  Neck - supple, no significant adenopathy  ABD- soft. Foley in place. Bilateral PCN with bloody urine  Extremities - peripheral pulses normal, 1+ LLE edema      LABS  Lab Results   Component Value Date/Time    WBC 9.5 08/29/2018 12:39 AM    HGB 7.9 (L) 08/29/2018 12:39 AM    HCT 23.9 (L) 08/29/2018 12:39 AM    PLATELET 216 08/29/2018 12:39 AM    MCV 85.1 08/29/2018 12:39 AM    ABS. NEUTROPHILS 8.3 (H) 08/29/2018 12:39 AM     Lab Results   Component Value Date/Time     Sodium 136 08/29/2018 12:39 AM    Potassium 3.9 08/29/2018 12:39 AM    Chloride 99 08/29/2018 12:39 AM    CO2 26 08/29/2018 12:39 AM    Glucose 116 (H) 08/29/2018 12:39 AM    BUN 73 (H) 08/29/2018 12:39 AM    Creatinine 6.02 (H) 08/29/2018 12:39 AM    GFR est AA 11 (L) 08/29/2018 12:39 AM    GFR est non-AA 9 (L) 08/29/2018 12:39 AM    Calcium 9.4 08/29/2018 12:39 AM     Lab Results   Component Value Date/Time    AST (SGOT) 26 08/26/2018 11:41 AM    Alk. phosphatase 88 08/26/2018 11:41 AM    Protein, total 8.2 08/26/2018 11:41 AM    Albumin 2.8 (L) 08/26/2018 11:41 AM    Globulin 5.4 (H) 08/26/2018 11:41 AM    A-G Ratio 0.5 (L) 08/26/2018 11:41 AM     Recent Labs     08/26/18  1141 07/05/18  0850 05/11/18  0959 03/24/18  0949 02/17/18  0851 01/01/18  0811 11/27/17  0806   PSA 0.1 0.0*  --  0.1 0.1 0.1 0.1   PSALT  --   --  <0.1  --   --   --   --        IMAGING  PET CT  03/23/17  IMPRESSION: Mass lesion right lower lobe is hypermetabolic and compatible with  the biopsy confirmed the result of adenocarcinoma. There are soft tissue  densities in the anterior abdominal wall bilaterally which are stable compared  to the prior chest abdomen pelvis CT but new compared to  the prior PET/CT.  Please correlate with any recent abdominal wall surgery as these may represent  port sites. Otherwise normal tracer distribution. Stable sclerotic osseous  metastatic disease without abnormal activity.    PATHOLOGY  Supraclavicular node   CYTOLOGIC INTERPRETATION:   Adenocarcinoma, consistent with prostate primary   See comment   General Categorization   Positive for malignancy.   Specimen Adequacy   Satisfactory for evaluation.   Comment   Touch preps and a core biopsy are examined and show islands of adenocarcinoma surrounded by fibrous stroma. A panel of immunohistochemical stains was performed to evaluate site of origin. The tumor cells are focally positive for PSA and PSAP and negative for CK7, CK20, TTF-1    CT CAP 03/22/18   IMPRESSION  IMPRESSION:   1. Status post right lung surgery with right perihilar consolidation and  scarring consistent with post surgery and radiation therapy change. These  findings are stable.  2. Enlarged prostate.  3. Sclerotic bone metastases unchanged.  4. Atherosclerotic aorta without aneurysm    CT CAP 07/13/18:   IMPRESSION:   1. Increased size of the prostate and increased nodularity involving the bladder  base and bladder wall.  2. New left hydronephrosis and hydroureter with obstruction at the level of the  ureterovesical junction.  3. Status post right lung surgery with elevation of the right hemidiaphragm and  surgical staples and scarring in the right hilum consistent with postsurgical  and postradiation therapy effect, unchanged.  4. Sclerotic bone metastases to the spine unchanged.  5. New low-attenuation liver lesion could represent a metastasis.  6. Dilated fluid-filled esophagus suggesting esophageal dysmotility with  thickened wall possibly secondary to radiation therapy.  7. Atherosclerotic aorta with coronary artery calcifications. No abdominal  aortic aneurysm.    CT 08/26/2018    IMPRESSION  IMPRESSION:  ??  1. Unchanged appearance of right hemithorax as above.  2. Increased number and size of hypoattenuating lesions of the liver consistent  with worsening hepatic metastatic disease.  3. Bilateral renal pelvocaliectasis and ureteral distention, new on the right  and increased on the left.  4. Foley catheter within urinary bladder which cannot be further assessed.  5. Osteosclerotic metastatic disease.  6. Anasarca appearance demonstrated in the interval.   ??  Pathology from liver biopsy 08/17/18:   Specimen Source   1: Liver, Core biopsy with touch intepretation:   CYTOLOGIC INTERPRETATION:   1. Liver, Core biopsy with touch intepretation:   Poorly differentiated adenocarcinoma consistent with metastatic pulmonary adenocarcinoma   See comment   General Categorization    Positive for malignancy.       ASSESSMENT  Bruce Clark is a 68 y.o. male with widely metastatic prostate adenocarcinoma and lung adenocarcinoma status post right lower lobe lobectomy on 05/22/17. Most recent imaging concerning for progression with new liver mets. He is admitted for acute renal failure    PLAN    Acute renal failure  Post obstructive  CT with Bilateral renal pelvocaliectasis and ureteral distention, new on the right  and increased on the left- likely due to previously seen progressive prostate cancer possibly infiltrating the bladder  PCN bilaterally today  Urology and Nephrology inputs are appreciated  Ideally would be nice to have a cystoscopy to understand the intravesical disease and r/o a new bladder primary but at the moment not feasible as he is on anticoagulation    Lower extremity edema and h/o DVT  Diagnosed 04/22/16  Cancer associated  On lifelong Eliquis 71m  BID   Now with marked RLE  Venous duplex obtained in ER with results pending   If he has an acute DVT  Agree with Heparin gtt for now    If GFR improves will consider Lovenox    He is noted to have ongoing hematuria though Hb has been stable  If he is dropping Hb tomorrow we may have to hold [facility] and consider IVC filter    Prostate cancer stage IV  Castrate sensitive, treatment na??ve widely metastatic prostate cancer to lymph nodes above and below the diaphragm and bones.  Prostate adenocarcinoma diagnosed on biopsy of the supraclavicular lymph node. Discussed in tumor board and a small cell component has been ruled out.  PSA at the time of diagnosis at 2400.  ??  Currently on Lupron 45 mg every 6 months. On Zytiga 1,044m daily + prednisone 5 mg daily which has controlled his disease now for > 2 years  Though his PSA is still undetectable at 0.1 his prior scans especially the bladder masses suggest progressive prostate cancer and probable castrate resistance  We will hold Zytiga inpatient    [facility]  R lung adenocarcinoma, [id number] stage IIIB- Parietal pleural involvement- PD-L1 99%  S/p RLL lobectomy on 05/22/17 then received PORT  Now with biopsy proven liver metastases  Repeat CT CAP pending  Plan on palliative Keytruda OP      Bone metastases  Diffuse  Completed palliative radiation that was initiated on 06/09/16      Right sided chest wall pain  Unclear cause  No corresponding mets  Controlled    Anemia   Due to progressive cancer, renal failure, hematuria  Transfuse if < 7 g/dl      Will continue to follow       RJuliene PinaMD, MValley ViewOncology associates  '

## 2018-08-29 NOTE — Progress Notes (Signed)
NAME: Bruce Clark        DOB:  28-Oct-1950        MRN:  [id number]        Assessment :    Plan:  --AKI- due to obstructive uropathy. UA is bland. No other cause to explain AKI. Basln Cr normal in Nov 2019  Hyponatremia  Obstructive Uropathy- likely due to prostate cancer  Metastatic Prostate and Lung Cancer  Liver mets  HTN --Creatinine better.  Not uremic.  Potassium OK. Has bilateral PCN's.  Fair UO.      Poor po. Continue NS for now.    D/W family at bedside.         Subjective:     Chief Complaint:  More alert.  Says that he is eating and drinking although his wife says that he is not.  No N/V  No pain.     Review of Systems:    Symptom Y/N Comments  Symptom Y/N Comments   Fever/Chills    Chest Pain     Poor Appetite    Edema     Cough    Abdominal Pain     Sputum    Joint Pain     SOB/DOE    Pruritis/Rash     Nausea/vomit    Tolerating PT/OT     Diarrhea    Tolerating Diet     Constipation    Other       Could not obtain due to:      Objective:     VITALS:   Last 24hrs VS reviewed since prior progress note. Most recent are:  Visit Vitals  BP 129/84   Pulse (!) 101   Temp 99.1 ??F (37.3 ??C)   Resp 18   Wt 55.6 kg (122 lb 9.2 oz)   SpO2 99%   BMI 19.20 kg/m??       Intake/Output Summary (Last 24 hours) at 08/29/2018 1416  Last data filed at 08/29/2018 1031  Gross per 24 hour   Intake ???   Output 2080 ml   Net -2080 ml      Telemetry Reviewed:     PHYSICAL EXAM:  General: NAD  No edema      Lab Data Reviewed: (see below)    Medications Reviewed: (see below)    PMH/SH reviewed - no change compared to H&P  ________________________________________________________________________  Care Plan discussed with:  Patient     Family      RN     Care Manager                    Consultant:          Comments   >50% of visit spent in counseling and coordination of care       ________________________________________________________________________   Rosalia Hammers, MD     Procedures: see electronic medical records for all procedures/Xrays and details which  were not copied into this note but were reviewed prior to creation of Plan.      LABS:  Recent Labs     08/29/18  0039 08/28/18  0448   WBC 9.5 5.3   HGB 7.9* 7.9*   HCT 23.9* 24.2*   PLT 216 232     Recent Labs     08/29/18  0039 08/27/18  0355 08/26/18  1425   NA 136 130* 133*   K 3.9 4.1 3.9   CL 99 95* 96*   CO2 26 25 28  BUN 73* 80* 79*   CREA 6.02* 8.96* 8.52*   GLU 116* 109* 94   CA 9.4 9.2 8.7     No results for input(s): SGOT, GPT, AP, TBIL, TP, ALB, GLOB, GGT, AML, LPSE in the last 72 hours.    No lab exists for component: AMYP, HLPSE  Recent Labs     08/29/18  0852 08/29/18  0039 08/28/18  0448  08/26/18  1425   INR  --   --   --   --  1.2*   PTP  --   --   --   --  11.8*   APTT 47.9* 52.9* 58.1*   < > 36.3*    < > = values in this interval not displayed.      No results for input(s): FE, TIBC, PSAT, FERR in the last 72 hours.   No results found for: FOL, RBCF   No results for input(s): PH, PCO2, PO2 in the last 72 hours.  No results for input(s): CPK, CKMB in the last 72 hours.    No lab exists for component: TROPONINI  No components found for: [facility]  Lab Results   Component Value Date/Time    Color YELLOW/STRAW 08/26/2018 02:25 PM    Appearance CLEAR 08/26/2018 02:25 PM    Specific gravity 1.012 08/26/2018 02:25 PM    Specific gravity 1.025 04/22/2016 08:22 PM    pH (UA) 5.0 08/26/2018 02:25 PM    Protein NEGATIVE  08/26/2018 02:25 PM    Glucose NEGATIVE  08/26/2018 02:25 PM    Ketone NEGATIVE  08/26/2018 02:25 PM    Bilirubin NEGATIVE  08/26/2018 02:25 PM    Urobilinogen 0.2 08/26/2018 02:25 PM    Nitrites NEGATIVE  08/26/2018 02:25 PM    Leukocyte Esterase NEGATIVE  08/26/2018 02:25 PM    Epithelial cells FEW 08/26/2018 02:25 PM    Bacteria NEGATIVE  08/26/2018 02:25 PM    WBC 0-4 08/26/2018 02:25 PM    RBC 0-5 08/26/2018 02:25 PM       MEDICATIONS:   Current Facility-Administered Medications   Medication Dose Route Frequency   ??? 0.9% sodium chloride infusion  75 mL/hr IntraVENous CONTINUOUS   ??? heparin 25,000 units in D5W 250 ml infusion  18-36 Units/kg/hr IntraVENous TITRATE   ??? predniSONE (DELTASONE) tablet 5 mg  5 mg Oral DAILY   ??? senna (SENOKOT) tablet 8.6 mg  1 Tab Oral QHS   ??? sodium chloride (NS) flush 5-40 mL  5-40 mL IntraVENous Q8H   ??? sodium chloride (NS) flush 5-40 mL  5-40 mL IntraVENous PRN   ??? LORazepam (ATIVAN) tablet 0.5 mg  0.5 mg Oral BID PRN   ??? oxyCODONE IR (ROXICODONE) tablet 5-10 mg  5-10 mg Oral Q4H PRN   ??? ondansetron (ZOFRAN) injection 4 mg  4 mg IntraVENous Q4H PRN

## 2018-08-29 NOTE — Progress Notes (Signed)
[facility]                                                                                          Hospitalist Progress Note  Emilio Math, MD  Answering service: [phone] OR [phone] from in house phone        Date of Service:  08/29/2018  NAME:  Bruce Clark  DOB:  [birth year]  MRN:  [id number]      Admission Summary:   Admitted weak and debilitated for eval insetting of lung cancer and prostate cancer     Interval history / Subjective:       08/29/2018 :  No pain, not eating  Cr slight improvement to 6 08/29/2018          Assessment & Plan:     ?? Obst uropathy ( 800 cc's post void residual w foley left in place), urology to follow CT abd/pelvis  on order  - for bil PERC's ; accomplished in 4 renal numbers in a.m.   ?? AKI - for nephrology input and avoiding nephrotoxic's and close following  Cr  Yet up; will follow up daily   Lab Results   Component Value Date/Time    Creatinine 6.02 (H) 08/29/2018 12:39 AM      Lab Results   Component Value Date/Time    Potassium 3.9 08/29/2018 12:39 AM      ?? DVT - for heparin drip, (failed Eliquis taken up to admission) heparin to be cont   ?? Anemia for cont monitoring and f/u hematopoetics   Lab Results   Component Value Date/Time    Iron 24 (L) 10/09/2017 08:25 AM    TIBC 131 (L) 10/09/2017 08:25 AM    Iron % saturation 18 (L) 10/09/2017 08:25 AM    Ferritin 1,162 (H) 10/09/2017 08:25 AM      Lab Results   Component Value Date/Time    Vitamin B12 371 10/09/2017 08:25 AM     Lab Results   Component Value Date/Time    HGB 7.9 (L) 08/29/2018 12:39 AM      ?? Elevated BNP  ?? Prostate cancer on chemo followed by Dr Melony Overly   ?? H/o stage 3 lung cancer post resection and rxt followed by Dr. Melony Overly   ?? DNR status (confirmed with pt on admissions)   ?? Clinically insignificant low serum sodium   ?? Low wt status suppliments   ?? Ambulation difficulties   Code status: DNR   DVT prophylaxis: on hep drip between proceedures      Care Plan discussed with: Patient/Family and Nurse consultants 9 hem [name]   Disposition: TBD     Hospital Problems  Date Reviewed: 14-Sep-2018          Codes Class Noted POA    * (Principal) AKI (acute kidney injury) ([place]) ICD-10-CM: N17.9  ICD-9-CM: 584.9  Sep 14, 2018 Unknown                Review of Systems:   Pertinent items are noted in HPI.  Fortunately no pain.      Vital Signs:  Last 24hrs VS reviewed since prior progress note. Most recent are:  Visit Vitals  BP 129/84   Pulse (!) 101   Temp 99.1 ??F (37.3 ??C)   Resp 18   Wt 55.6 kg (122 lb 9.2 oz)   SpO2 99%   BMI 19.20 kg/m??         Intake/Output Summary (Last 24 hours) at 08/29/2018 1219  Last data filed at 08/29/2018 1031  Gross per 24 hour   Intake ???   Output 2415 ml   Net -2415 ml        Physical Examination:             Constitutional:  No acute distress, weak, debilitated, and  Emaciated, appearing. Else cooperative, pleasant    ENT:  Oral mucous moist, oropharynx benign.    Resp:  CTA bilaterally. No wheezing/rhonchi/rales. No accessory muscle use   CV:  Regular rhythm, normal rate, no  gallops, rubs    GI:  Soft, non distended, non tender. normoactive bowel sounds, no hepatosplenomegaly     Musculoskeletal:  rt edema > lt  edema, warm,      Neurologic:  Moves all extremities.  AAOx3, CN II-XII reviewed     Psych:  Good insight, Not anxious nor agitated.  Skin:  Good turgor, no rashes or ulcers       Data Review:    Review and/or order of clinical lab test      Labs:     Recent Labs     08/29/18  0039 08/28/18  0448   WBC 9.5 5.3   HGB 7.9* 7.9*   HCT 23.9* 24.2*   PLT 216 232     Recent Labs     08/29/18  0039 08/27/18  0355 08/26/18  1425   NA 136 130* 133*   K 3.9 4.1 3.9   CL 99 95* 96*   CO2 26 25 28    BUN 73* 80* 79*   CREA 6.02* 8.96* 8.52*   GLU 116* 109* 94   CA 9.4 9.2 8.7     No results for input(s): SGOT, GPT, ALT, AP, TBIL, TBILI, TP, ALB, GLOB, GGT, AML, LPSE in the last 72 hours.    No lab exists for component: AMYP, HLPSE  Recent Labs      08/29/18  0852 08/29/18  0039 08/28/18  0448  08/26/18  1425   INR  --   --   --   --  1.2*   PTP  --   --   --   --  11.8*   APTT 47.9* 52.9* 58.1*   < > 36.3*    < > = values in this interval not displayed.      No results for input(s): FE, TIBC, PSAT, FERR in the last 72 hours.   No results found for: FOL, RBCF   No results for input(s): PH, PCO2, PO2 in the last 72 hours.  Recent Labs     08/26/18  1425   TROIQ <0.05     Lab Results   Component Value Date/Time    Cholesterol, total 184 07/01/2016 10:50 PM    HDL Cholesterol 64 07/01/2016 10:50 PM    LDL, calculated 109.2 (H) 07/01/2016 10:50 PM    Triglyceride 54 07/01/2016 10:50 PM    CHOL/HDL Ratio 2.9 07/01/2016 10:50 PM     Lab Results   Component Value Date/Time    Glucose (POC) 103 (H) 04/28/2016 10:31 AM    Glucose (  POC) 90 04/26/2016 10:33 PM    Glucose (POC) 71 04/26/2016 05:12 PM    Glucose (POC) 106 (H) 04/26/2016 12:03 PM    Glucose (POC) 117 (H) 04/25/2016 04:52 PM     Lab Results   Component Value Date/Time    Color YELLOW/STRAW 08/26/2018 02:25 PM    Appearance CLEAR 08/26/2018 02:25 PM    Specific gravity 1.012 08/26/2018 02:25 PM    Specific gravity 1.025 04/22/2016 08:22 PM    pH (UA) 5.0 08/26/2018 02:25 PM    Protein NEGATIVE  08/26/2018 02:25 PM    Glucose NEGATIVE  08/26/2018 02:25 PM    Ketone NEGATIVE  08/26/2018 02:25 PM    Bilirubin NEGATIVE  08/26/2018 02:25 PM    Urobilinogen 0.2 08/26/2018 02:25 PM    Nitrites NEGATIVE  08/26/2018 02:25 PM    Leukocyte Esterase NEGATIVE  08/26/2018 02:25 PM    Epithelial cells FEW 08/26/2018 02:25 PM    Bacteria NEGATIVE  08/26/2018 02:25 PM    WBC 0-4 08/26/2018 02:25 PM    RBC 0-5 08/26/2018 02:25 PM         Medications Reviewed:     Current Facility-Administered Medications   Medication Dose Route Frequency   ??? 0.9% sodium chloride infusion  75 mL/hr IntraVENous CONTINUOUS   ??? heparin 25,000 units in D5W 250 ml infusion  18-36 Units/kg/hr IntraVENous TITRATE    ??? predniSONE (DELTASONE) tablet 5 mg  5 mg Oral DAILY   ??? senna (SENOKOT) tablet 8.6 mg  1 Tab Oral QHS   ??? sodium chloride (NS) flush 5-40 mL  5-40 mL IntraVENous Q8H   ??? sodium chloride (NS) flush 5-40 mL  5-40 mL IntraVENous PRN   ??? LORazepam (ATIVAN) tablet 0.5 mg  0.5 mg Oral BID PRN   ??? oxyCODONE IR (ROXICODONE) tablet 5-10 mg  5-10 mg Oral Q4H PRN   ??? ondansetron (ZOFRAN) injection 4 mg  4 mg IntraVENous Q4H PRN     ______________________________________________________________________  EXPECTED LENGTH OF STAY: 3d 4h  ACTUAL LENGTH OF STAY:          3                 Emilio Math, MD

## 2018-08-29 NOTE — Progress Notes (Signed)
Urology Progress Note    Patient: Bruce Clark MRN: [id number]  SSN: [id number]    Date of Birth: 01/07/1951  Age: 68 y.o.  Sex: male        ADMITTED: 08/26/2018 to Emilio Math, MD for AKI (acute kidney injury) [facility]) [N17.9]  POD# * No surgery found *     He is feeling better and has no complaints.    Both nephrostomy tubes are draining.  Urine is clearing.  All his urine output is from his nephrostomies with nothing coming from the Foley.    Creatinine 6.0.    Vitals: Temp (24hrs), Avg:98.6 ??F (37 ??C), Min:98.3 ??F (36.8 ??C), Max:99.1 ??F (37.3 ??C)                   Blood pressure 129/84, pulse (!) 101, temperature 99.1 ??F (37.3 ??C), resp. rate 18, weight 55.6 kg (122 lb 9.2 oz), SpO2 99 %.    Intake and Output:  01/10 1901 - 01/12 0700  In: 229.9 [P.O.:90; I.V.:138.9]  Out: 7253 [Urine:5315]            Labs:  Labs:   Lab Results   Component Value Date/Time    WBC 9.5 08/29/2018 12:39 AM    HGB 7.9 (L) 08/29/2018 12:39 AM    Creatinine 6.02 (H) 08/29/2018 12:39 AM         Assessment/Plan:  Nephrostomies functioning well.  Creatinine coming down.  Will remove Foley catheter.       Signed By: Thad Ranger, MD - August 29, 2018

## 2018-08-29 NOTE — Progress Notes (Signed)
08/29/18 2028   Vital Signs   Temp 100.3 ??F (37.9 ??C)   Temp Source Oral   Pulse (Heart Rate) (!) 113  (rn notified)   Heart Rate Source Monitor   Resp Rate 18   O2 Sat (%) 97 %   Level of Consciousness Alert   BP 108/70   MAP (Calculated) 83   BP 1 Method Automatic   BP 1 Location Right arm   BP Patient Position At rest   MEWS Score 3     Paged Dr. Lorne Skeens, informed her of mews score and received order for Tylenol.

## 2018-08-29 NOTE — Progress Notes (Signed)
Attempted to pull the heparin bolus from the pyxis. Pharmacy has not verified it yet. Unable to override the medication. Sent message to pharmacy. Will attempt to call.

## 2018-08-29 NOTE — Progress Notes (Signed)
Progress Notes by Thad Ranger, MD at 08/29/18 1443                Author: Thad Ranger, MD  Service: Urology  Author Type: Physician       Filed: 08/29/18 1444  Date of Service: 08/29/18 1443  Status: Signed          Editor: Thad Ranger, MD (Physician)                               Urology Progress Note          Patient: Bruce Clark  MRN: [id number]   SSN: [id number]          Date of Birth: 07-Feb-1951    Age: 68 y.o.   Sex: male                ADMITTED: 08/26/2018  to Emilio Math, MD for AKI (acute kidney injury) [facility]) [N17.9]   POD# * No surgery found *       He is feeling better and has no complaints.      Both nephrostomy tubes are draining.  Urine is clearing.  All his urine output is from his nephrostomies with nothing coming from the Foley.      Creatinine 6.0.      Vitals: Temp (24hrs), Avg:98.6 ??F (37 ??C), Min:98.3 ??F (36.8 ??C), Max:99.1 ??F (37.3 ??C)                     Blood pressure 129/84, pulse (!) 101, temperature 99.1 ??F (37.3  ??C), resp. rate 18, weight 55.6 kg (122 lb 9.2 oz), SpO2 99 %.      Intake and Output:   01/10 1901 - 01/12 0700   In: 229.9 [P.O.:90; I.V.:138.9]   Out: 6295 [Urine:5315]                  Labs:   Labs:      Lab Results         Component  Value  Date/Time            WBC  9.5  08/29/2018 12:39 AM       HGB  7.9 (L)  08/29/2018 12:39 AM            Creatinine  6.02 (H)  08/29/2018 12:39 AM              Assessment/Plan:     Nephrostomies functioning well.  Creatinine coming down.  Will remove Foley catheter.           Signed By: Thad Ranger, MD -  August 29, 2018

## 2018-08-29 NOTE — Progress Notes (Signed)
Progress  Notes by Emilio Math, MD at 08/29/18 1219                Author: Emilio Math, MD  Service: --  Author Type: Physician       Filed: 08/29/18 1220  Date of Service: 08/29/18 1219  Status: Signed          Editor: Emilio Math, MD (Physician)                    [facility]                                                                                              Hospitalist Progress Note   Emilio Math, MD   Answering service: [phone] OR [phone] from in house phone            Date of Service:  08/29/2018   NAME:  Bruce Clark   DOB:  March 15, 1951   MRN:  [id number]           Admission Summary:        Admitted weak and debilitated for eval insetting of lung cancer and prostate cancer       Interval history / Subjective:             08/29/2018 :   No pain, not eating   Cr slight improvement to 6 08/29/2018                 Assessment & Plan:        ??    Obst uropathy ( 800 cc's post void residual w foley left in place), urology to follow CT abd/pelvis  on order  - for bil PERC's ; accomplished in 4 renal numbers in  a.m.    ??  AKI - for nephrology input and avoiding nephrotoxic's and close following  Cr  Yet up; will follow up daily    Lab Results      Component  Value  Date/Time        Creatinine  6.02 (H)  08/29/2018 12:39 AM            Lab Results      Component  Value  Date/Time        Potassium  3.9  08/29/2018 12:39 AM            ??  DVT - for heparin drip, (failed Eliquis taken up to admission) heparin to be cont    ??  Anemia for cont monitoring and f/u hematopoetics    Lab Results      Component  Value  Date/Time        Iron  24 (L)  10/09/2017 08:25 AM        TIBC  131 (L)  10/09/2017 08:25 AM        Iron % saturation  18 (L)  10/09/2017 08:25 AM        Ferritin  1,162 (H)  10/09/2017 08:25 AM            Lab  Results      Component  Value  Date/Time        Vitamin B12  371  10/09/2017 08:25 AM           Lab Results      Component  Value  Date/Time         HGB  7.9 (L)  08/29/2018 12:39 AM            ??  Elevated BNP   ??  Prostate cancer on chemo followed by Dr Melony Overly    ??  H/o stage 3 lung cancer post resection and rxt followed by Dr. Melony Overly    ??  DNR status (confirmed with pt on admissions)    ??  Clinically insignificant low serum sodium    ??  Low wt status suppliments    ??  Ambulation difficulties    Code status: DNR    DVT prophylaxis: on hep drip between proceedures       Care Plan discussed with: Patient/Family and Nurse consultants 9 hem [name]    Disposition: TBD           Hospital Problems   Date Reviewed:  08/26/2018                         Codes  Class  Noted  POA              * (Principal) AKI (acute kidney injury) ([place])  ICD-10-CM: N17.9   ICD-9-CM: 584.9    08/26/2018  Unknown                               Review of Systems:     Pertinent items are noted in HPI.  Fortunately no pain.           Vital Signs:      Last 24hrs VS reviewed since prior progress note. Most recent are:   Visit Vitals      BP  129/84     Pulse  (!) 101     Temp  99.1 ??F (37.3 ??C)     Resp  18     Wt  55.6 kg (122 lb 9.2 oz)     SpO2  99%        BMI  19.20 kg/m??              Intake/Output Summary (Last 24 hours) at 08/29/2018 1219   Last data filed at 08/29/2018 1031     Gross per 24 hour        Intake  --        Output  2415 ml        Net  -2415 ml              Physical Examination:                     Constitutional:   No acute distress, weak, debilitated, and  Emaciated, appearing. Else cooperative, pleasant      ENT:   Oral mucous moist, oropharynx benign.      Resp:   CTA bilaterally. No wheezing/rhonchi/rales. No accessory muscle use     CV:   Regular rhythm, normal rate, no  gallops, rubs      GI:   Soft, non distended, non tender. normoactive bowel sounds, no hepatosplenomegaly       Musculoskeletal:   rt  edema > lt  edema, warm,           Neurologic:   Moves all extremities.  AAOx3, CN II-XII reviewed       Psych:  Good insight, Not anxious nor agitated.   Skin:  Good turgor, no  rashes or ulcers               Data Review:      Review and/or order of clinical lab test           Labs:          Recent Labs            08/29/18   0039  08/28/18   0448     WBC  9.5  5.3     HGB  7.9*  7.9*     HCT  23.9*  24.2*         PLT  216  232          Recent Labs             08/29/18   0039  08/27/18   0355  08/26/18   1425     NA  136  130*  133*     K  3.9  4.1  3.9     CL  99  95*  96*     CO2  26  25  28      BUN  73*  80*  79*     CREA  6.02*  8.96*  8.52*     GLU  116*  109*  94          CA  9.4  9.2  8.7        No results for input(s): SGOT, GPT, ALT, AP, TBIL, TBILI, TP, ALB, GLOB, GGT, AML, LPSE in the last 72 hours.      No lab exists for component: AMYP, HLPSE     Recent Labs               08/29/18   0852  08/29/18   0039  08/28/18   0448    08/26/18   1425     INR   --    --    --    --   1.2*     PTP   --    --    --    --   11.8*     APTT  47.9*  52.9*  58.1*    < >  36.3*        < > = values in this interval not displayed.         No results for input(s): FE, TIBC, PSAT, FERR in the last 72 hours.    No results found for: FOL, RBCF    No results for input(s): PH, PCO2, PO2 in the last 72 hours.     Recent Labs           08/26/18   1425        TROIQ  <0.05          Lab Results         Component  Value  Date/Time            Cholesterol, total  184  07/01/2016 10:50 PM       HDL Cholesterol  64  07/01/2016 10:50 PM       LDL, calculated  109.2 (H)  07/01/2016 10:50 PM  Triglyceride  54  07/01/2016 10:50 PM            CHOL/HDL Ratio  2.9  07/01/2016 10:50 PM          Lab Results         Component  Value  Date/Time            Glucose (POC)  103 (H)  04/28/2016 10:31 AM       Glucose (POC)  90  04/26/2016 10:33 PM       Glucose (POC)  71  04/26/2016 05:12 PM       Glucose (POC)  106 (H)  04/26/2016 12:03 PM            Glucose (POC)  117 (H)  04/25/2016 04:52 PM          Lab Results         Component  Value  Date/Time            Color  YELLOW/STRAW  08/26/2018 02:25 PM       Appearance  CLEAR   08/26/2018 02:25 PM       Specific gravity  1.012  08/26/2018 02:25 PM       Specific gravity  1.025  04/22/2016 08:22 PM       pH (UA)  5.0  08/26/2018 02:25 PM       Protein  NEGATIVE   08/26/2018 02:25 PM       Glucose  NEGATIVE   08/26/2018 02:25 PM       Ketone  NEGATIVE   08/26/2018 02:25 PM       Bilirubin  NEGATIVE   08/26/2018 02:25 PM       Urobilinogen  0.2  08/26/2018 02:25 PM       Nitrites  NEGATIVE   08/26/2018 02:25 PM       Leukocyte Esterase  NEGATIVE   08/26/2018 02:25 PM       Epithelial cells  FEW  08/26/2018 02:25 PM       Bacteria  NEGATIVE   08/26/2018 02:25 PM       WBC  0-4  08/26/2018 02:25 PM            RBC  0-5  08/26/2018 02:25 PM                Medications Reviewed:          Current Facility-Administered Medications          Medication  Dose  Route  Frequency           ?  0.9% sodium chloride infusion   75 mL/hr  IntraVENous  CONTINUOUS     ?  heparin 25,000 units in D5W 250 ml infusion   18-36 Units/kg/hr  IntraVENous  TITRATE     ?  predniSONE (DELTASONE) tablet 5 mg   5 mg  Oral  DAILY     ?  senna (SENOKOT) tablet 8.6 mg   1 Tab  Oral  QHS     ?  sodium chloride (NS) flush 5-40 mL   5-40 mL  IntraVENous  Q8H     ?  sodium chloride (NS) flush 5-40 mL   5-40 mL  IntraVENous  PRN     ?  LORazepam (ATIVAN) tablet 0.5 mg   0.5 mg  Oral  BID PRN     ?  oxyCODONE IR (ROXICODONE) tablet 5-10 mg   5-10 mg  Oral  Q4H PRN           ?  ondansetron (ZOFRAN) injection 4 mg   4 mg  IntraVENous  Q4H PRN        ______________________________________________________________________   EXPECTED LENGTH OF STAY: 3d 4h   ACTUAL LENGTH OF STAY:          3                    Emilio Math, MD

## 2018-08-30 LAB — BASIC METABOLIC PANEL
Anion Gap: 10 mmol/L (ref 5–15)
BUN: 55 MG/DL — ABNORMAL HIGH (ref 6–20)
Bun/Cre Ratio: 17 (ref 12–20)
CO2: 25 mmol/L (ref 21–32)
Calcium: 9 MG/DL (ref 8.5–10.1)
Chloride: 100 mmol/L (ref 97–108)
Creatinine: 3.25 MG/DL — ABNORMAL HIGH (ref 0.70–1.30)
EGFR IF NonAfrican American: 19 mL/min/{1.73_m2} — ABNORMAL LOW (ref >60)
GFR African American: 23 mL/min/{1.73_m2} — ABNORMAL LOW (ref >60)
Glucose: 126 mg/dL — ABNORMAL HIGH (ref 65–100)
Potassium: 3.7 mmol/L (ref 3.5–5.1)
Sodium: 135 mmol/L — ABNORMAL LOW (ref 136–145)

## 2018-08-30 LAB — APTT
aPTT: 55.8 s — ABNORMAL HIGH (ref 22.1–32.0)
aPTT: 58.1 s — ABNORMAL HIGH (ref 22.1–32.0)
aPTT: 58.4 s — ABNORMAL HIGH (ref 22.1–32.0)
aPTT: 60.4 s — ABNORMAL HIGH (ref 22.1–32.0)

## 2018-08-30 LAB — CBC WITH AUTO DIFFERENTIAL
Basophils %: 0 % (ref 0–1)
Basophils Absolute: 0 10*3/uL (ref 0.0–0.1)
Eosinophils %: 0 % (ref 0–7)
Eosinophils Absolute: 0 10*3/uL (ref 0.0–0.4)
Granulocyte Absolute Count: 0 10*3/uL (ref 0.00–0.04)
Hematocrit: 21.4 % — ABNORMAL LOW (ref 36.6–50.3)
Hemoglobin: 7 g/dL — ABNORMAL LOW (ref 12.1–17.0)
Immature Granulocytes: 0 % (ref 0.0–0.5)
Lymphocytes %: 5 % — ABNORMAL LOW (ref 12–49)
Lymphocytes Absolute: 0.4 10*3/uL — ABNORMAL LOW (ref 0.8–3.5)
MCH: 28.3 PG (ref 26.0–34.0)
MCHC: 32.7 g/dL (ref 30.0–36.5)
MCV: 86.6 FL (ref 80.0–99.0)
MPV: 9.7 FL (ref 8.9–12.9)
Monocytes %: 9 % (ref 5–13)
Monocytes Absolute: 0.9 10*3/uL (ref 0.0–1.0)
NRBC Absolute: 0 10*3/uL (ref 0.00–0.01)
Neutrophils %: 86 % — ABNORMAL HIGH (ref 32–75)
Neutrophils Absolute: 7.9 10*3/uL (ref 1.8–8.0)
Nucleated RBCs: 0 PER 100 WBC
Platelets: 213 10*3/uL (ref 150–400)
RBC: 2.47 M/uL — ABNORMAL LOW (ref 4.10–5.70)
RDW: 13.3 % (ref 11.5–14.5)
WBC: 9.2 10*3/uL (ref 4.1–11.1)

## 2018-08-30 LAB — METABOLIC PANEL, BASIC
Anion gap: 10 mmol/L (ref 5–15)
BUN/Creatinine ratio: 17 (ref 12–20)
BUN: 55 MG/DL — ABNORMAL HIGH (ref 6–20)
CO2: 25 mmol/L (ref 21–32)
Calcium: 9 MG/DL (ref 8.5–10.1)
Chloride: 100 mmol/L (ref 97–108)
Creatinine: 3.25 MG/DL — ABNORMAL HIGH (ref 0.70–1.30)
GFR est AA: 23 mL/min/{1.73_m2} — ABNORMAL LOW (ref >60)
GFR est non-AA: 19 mL/min/{1.73_m2} — ABNORMAL LOW (ref >60)
Glucose: 126 mg/dL — ABNORMAL HIGH (ref 65–100)
Potassium: 3.7 mmol/L (ref 3.5–5.1)
Sodium: 135 mmol/L — ABNORMAL LOW (ref 136–145)

## 2018-08-30 LAB — PTT
aPTT: 55.8 s — ABNORMAL HIGH (ref 22.1–32.0)
aPTT: 58.1 s — ABNORMAL HIGH (ref 22.1–32.0)
aPTT: 58.4 s — ABNORMAL HIGH (ref 22.1–32.0)
aPTT: 60.4 s — ABNORMAL HIGH (ref 22.1–32.0)

## 2018-08-30 LAB — CBC WITH AUTOMATED DIFF
ABS. BASOPHILS: 0 10*3/uL (ref 0.0–0.1)
ABS. EOSINOPHILS: 0 10*3/uL (ref 0.0–0.4)
ABS. IMM. GRANS.: 0 10*3/uL (ref 0.00–0.04)
ABS. LYMPHOCYTES: 0.4 10*3/uL — ABNORMAL LOW (ref 0.8–3.5)
ABS. MONOCYTES: 0.9 10*3/uL (ref 0.0–1.0)
ABS. NEUTROPHILS: 7.9 10*3/uL (ref 1.8–8.0)
ABSOLUTE NRBC: 0 10*3/uL (ref 0.00–0.01)
BASOPHILS: 0 % (ref 0–1)
EOSINOPHILS: 0 % (ref 0–7)
HCT: 21.4 % — ABNORMAL LOW (ref 36.6–50.3)
HGB: 7 g/dL — ABNORMAL LOW (ref 12.1–17.0)
IMMATURE GRANULOCYTES: 0 % (ref 0.0–0.5)
LYMPHOCYTES: 5 % — ABNORMAL LOW (ref 12–49)
MCH: 28.3 PG (ref 26.0–34.0)
MCHC: 32.7 g/dL (ref 30.0–36.5)
MCV: 86.6 FL (ref 80.0–99.0)
MONOCYTES: 9 % (ref 5–13)
MPV: 9.7 FL (ref 8.9–12.9)
NEUTROPHILS: 86 % — ABNORMAL HIGH (ref 32–75)
NRBC: 0 PER 100 WBC
PLATELET: 213 10*3/uL (ref 150–400)
RBC: 2.47 M/uL — ABNORMAL LOW (ref 4.10–5.70)
RDW: 13.3 % (ref 11.5–14.5)
WBC: 9.2 10*3/uL (ref 4.1–11.1)

## 2018-08-30 MED ORDER — ACETAMINOPHEN 325 MG TABLET
325 mg | ORAL | Status: DC | PRN
Start: 2018-08-30 — End: 2018-09-03
  Administered 2018-08-30: 02:00:00 via ORAL

## 2018-08-30 MED FILL — OXYCODONE 5 MG TAB: 5 mg | ORAL | Qty: 2

## 2018-08-30 MED FILL — SENNA LAX 8.6 MG TABLET: 8.6 mg | ORAL | Qty: 1

## 2018-08-30 MED FILL — ACETAMINOPHEN 325 MG TABLET: 325 mg | ORAL | Qty: 2

## 2018-08-30 MED FILL — PREDNISONE 5 MG TAB: 5 mg | ORAL | Qty: 1

## 2018-08-30 MED FILL — HEPARIN (PORCINE) IN D5W 25,000 UNIT/250 ML IV: 25000 unit/250 mL(100 unit/mL) | INTRAVENOUS | Qty: 250

## 2018-08-30 MED FILL — NORMAL SALINE FLUSH 0.9 % INJECTION SYRINGE: INTRAMUSCULAR | Qty: 10

## 2018-08-30 NOTE — Progress Notes (Signed)
TOC  -Noted transfer to 6E  -Continue Medical Care  -Care Management monitoring for needs  -Family will transport at discharge.     Pierce Crane, MHA/CRM

## 2018-08-30 NOTE — Progress Notes (Signed)
Progress  Notes by Emilio Math, MD at 08/30/18 1434                Author: Emilio Math, MD  Service: --  Author Type: Physician       Filed: 08/30/18 1436  Date of Service: 08/30/18 1434  Status: Signed          Editor: Emilio Math, MD (Physician)                    [facility]                                                                                              Hospitalist Progress Note   Emilio Math, MD   Answering service: [phone] OR [phone] from in house phone            Date of Service:  08/30/2018   NAME:  Bruce Clark   DOB:  November 09, 1950   MRN:  [id number]           Admission Summary:        Admitted weak and debilitated for eval insetting of lung cancer and prostate cancer       Interval history / Subjective:             08/30/2018 :   Had bowel movement   Cr down to 3 from 8   Clark UO.    Hem/onc considering palliative chemo post discharge.                 Assessment & Plan:        ??    Obst uropathy ( 800 cc's post void residual w foley left in place), urology to follow CT abd/pelvis  on order  - for bil PERC's ; accomplished in 4 renal numbers in  a.m.    ??  AKI - for nephrology input and avoiding nephrotoxic's and close following  Cr  Yet up; will follow up daily    Lab Results      Component  Value  Date/Time        Creatinine  3.25 (H)  08/30/2018 12:28 AM            Lab Results      Component  Value  Date/Time        Potassium  3.7  08/30/2018 12:28 AM            ??  DVT - for heparin drip, (failed Eliquis taken up to admission) heparin to be cont  And ? lovenox on discharge if renals allow.    ??  Anemia for cont monitoring and f/u hematopoetics    Lab Results      Component  Value  Date/Time        Iron  24 (L)  10/09/2017 08:25 AM        TIBC  131 (L)  10/09/2017 08:25 AM        Iron % saturation  18 (L)  10/09/2017 08:25 AM  Ferritin  1,162 (H)  10/09/2017 08:25 AM            Lab Results      Component  Value  Date/Time        Vitamin  B12  371  10/09/2017 08:25 AM           Lab Results      Component  Value  Date/Time        HGB  7.0 (L)  08/30/2018 12:28 AM            ??  Elevated BNP Clark hf by exam    ??  Prostate cancer on chemo followed by Dr Melony Overly    ??  H/o stage 3 lung cancer post resection and rxt followed by Dr. Melony Overly    ??  DNR status (confirmed with pt on admissions)    ??  Clinically insignificant low serum sodium    ??  Low wt status suppliments    ??  Ambulation difficulties    Code status: DNR    DVT prophylaxis: on hep drip between proceedures       Care Plan discussed with: Patient/Family and Nurse consultants 9 hem [name]    Disposition: TBD           Hospital Problems   Date Reviewed:  08/30/2018                         Codes  Class  Noted  POA              * (Principal) AKI (acute kidney injury) ([place])  ICD-10-CM: N17.9   ICD-9-CM: 584.9    08/26/2018  Unknown                               Review of Systems:     Pertinent items are noted in HPI.  Fortunately Clark pain.           Vital Signs:      Last 24hrs VS reviewed since prior progress note. Most recent are:   Visit Vitals      BP  119/74 (BP 1 Location: Right arm, BP Patient Position: At rest)     Pulse  95     Temp  98.7 ??F (37.1 ??C)     Resp  18     Wt  55.6 kg (122 lb 9.2 oz)     SpO2  99%        BMI  19.20 kg/m??              Intake/Output Summary (Last 24 hours) at 08/30/2018 1434   Last data filed at 08/30/2018 0553     Gross per 24 hour        Intake  2355 ml        Output  1300 ml        Net  1055 ml              Physical Examination:               Stable exam  Emaciated.       Constitutional:   Clark acute distress, weak, debilitated, and  Emaciated, appearing. Else cooperative, pleasant      ENT:   Oral mucous moist, oropharynx benign.      Resp:   CTA bilaterally. Clark wheezing/rhonchi/rales. Clark accessory muscle use  CV:   Regular rhythm, normal rate, Clark  gallops, rubs      GI:   Soft, non distended, non tender. normoactive bowel sounds, Clark hepatosplenomegaly       Musculoskeletal:    trace + rt edema > lt  edema, warm,           Neurologic:   Moves all extremities.  AAOx3, CN II-XII reviewed       Psych:  Good insight, Not anxious nor agitated.   Skin:  Good turgor, Clark rashes or ulcers               Data Review:      Review and/or order of clinical lab test           Labs:          Recent Labs            08/30/18   0028  08/29/18   0039     WBC  9.2  9.5     HGB  7.0*  7.9*     HCT  21.4*  23.9*         PLT  213  216          Recent Labs            08/30/18   0028  08/29/18   0039     NA  135*  136     K  3.7  3.9     CL  100  99     CO2  25  26     BUN  55*  73*     CREA  3.25*  6.02*     GLU  126*  116*         CA  9.0  9.4        Clark results for input(s): SGOT, GPT, ALT, AP, TBIL, TBILI, TP, ALB, GLOB, GGT, AML, LPSE in the last 72 hours.      Clark lab exists for component: AMYP, HLPSE     Recent Labs             08/30/18   0937  08/30/18   0028  08/29/18   1816          APTT  58.4*  55.8*  58.1*         Clark results for input(s): FE, TIBC, PSAT, FERR in the last 72 hours.    Clark results found for: FOL, RBCF    Clark results for input(s): PH, PCO2, PO2 in the last 72 hours.   Clark results for input(s): CPK, CKNDX, TROIQ in the last 72 hours.      Clark lab exists for component: CPKMB     Lab Results         Component  Value  Date/Time            Cholesterol, total  184  07/01/2016 10:50 PM       HDL Cholesterol  64  07/01/2016 10:50 PM       LDL, calculated  109.2 (H)  07/01/2016 10:50 PM       Triglyceride  54  07/01/2016 10:50 PM            CHOL/HDL Ratio  2.9  07/01/2016 10:50 PM          Lab Results         Component  Value  Date/Time  Glucose (POC)  103 (H)  04/28/2016 10:31 AM       Glucose (POC)  90  04/26/2016 10:33 PM       Glucose (POC)  71  04/26/2016 05:12 PM       Glucose (POC)  106 (H)  04/26/2016 12:03 PM            Glucose (POC)  117 (H)  04/25/2016 04:52 PM          Lab Results         Component  Value  Date/Time            Color  YELLOW/STRAW  08/26/2018 02:25 PM        Appearance  CLEAR  08/26/2018 02:25 PM       Specific gravity  1.012  08/26/2018 02:25 PM       Specific gravity  1.025  04/22/2016 08:22 PM       pH (UA)  5.0  08/26/2018 02:25 PM       Protein  NEGATIVE   08/26/2018 02:25 PM       Glucose  NEGATIVE   08/26/2018 02:25 PM       Ketone  NEGATIVE   08/26/2018 02:25 PM       Bilirubin  NEGATIVE   08/26/2018 02:25 PM       Urobilinogen  0.2  08/26/2018 02:25 PM       Nitrites  NEGATIVE   08/26/2018 02:25 PM       Leukocyte Esterase  NEGATIVE   08/26/2018 02:25 PM       Epithelial cells  FEW  08/26/2018 02:25 PM       Bacteria  NEGATIVE   08/26/2018 02:25 PM       WBC  0-4  08/26/2018 02:25 PM            RBC  0-5  08/26/2018 02:25 PM                Medications Reviewed:          Current Facility-Administered Medications          Medication  Dose  Route  Frequency           ?  acetaminophen (TYLENOL) tablet 650 mg   650 mg  Oral  Q4H PRN     ?  0.9% sodium chloride infusion   75 mL/hr  IntraVENous  CONTINUOUS     ?  heparin 25,000 units in D5W 250 ml infusion   18-36 Units/kg/hr  IntraVENous  TITRATE     ?  predniSONE (DELTASONE) tablet 5 mg   5 mg  Oral  DAILY     ?  senna (SENOKOT) tablet 8.6 mg   1 Tab  Oral  QHS     ?  sodium chloride (NS) flush 5-40 mL   5-40 mL  IntraVENous  Q8H     ?  sodium chloride (NS) flush 5-40 mL   5-40 mL  IntraVENous  PRN     ?  LORazepam (ATIVAN) tablet 0.5 mg   0.5 mg  Oral  BID PRN     ?  oxyCODONE IR (ROXICODONE) tablet 5-10 mg   5-10 mg  Oral  Q4H PRN           ?  ondansetron (ZOFRAN) injection 4 mg   4 mg  IntraVENous  Q4H PRN        ______________________________________________________________________   EXPECTED LENGTH OF STAY: 3d 4h  ACTUAL LENGTH OF STAY:          4                    Emilio Math, MD

## 2018-08-30 NOTE — Progress Notes (Signed)
Progress  Notes by Philmore Pali, MD at 08/30/18 1021                Author: Philmore Pali, MD  Service: Nephrology  Author Type: Physician       Filed: 08/30/18 1023  Date of Service: 08/30/18 1021  Status: Signed          Editor: Philmore Pali, MD (Physician)                                                                                                 NAME: Bruce Clark         DOB:  [birth year]         MRN:  [id number]          Assessment :     Plan:      --AKI- due to obstructive uropathy. UA is bland. No other cause to explain AKI. Basln Cr normal in Nov 2019   Hyponatremia   Obstructive Uropathy- likely due to prostate cancer   Metastatic Prostate and Lung Cancer   Liver mets   HTN  --Creatinine much better at 3.2mg /dl.  Not uremic.  Potassium OK. Has bilateral PCN's.  Fair UO.        Poor po      Hgb down to 7. Trend      D/W RN               Subjective:        Chief Complaint:  No events overnight.   B/L PCN draining. R>L. No N/V  No pain.       Review of Systems:              Symptom  Y/N  Comments    Symptom  Y/N  Comments             Fever/Chills        Chest Pain                 Poor Appetite        Edema                 Cough        Abdominal Pain         Sputum        Joint Pain         SOB/DOE        Pruritis/Rash         Nausea/vomit        Tolerating PT/OT         Diarrhea        Tolerating Diet                 Constipation        Other               Could not obtain due to:            Objective:        VITALS:    Last 24hrs VS reviewed since prior progress note. Most recent  are:   Visit Vitals      BP  119/74 (BP 1 Location: Right arm, BP Patient Position: At rest)     Pulse  95     Temp  98.7 ??F (37.1 ??C)     Resp  18     Wt  55.6 kg (122 lb 9.2 oz)     SpO2  99%        BMI  19.20 kg/m??           Intake/Output Summary (Last 24 hours) at 08/30/2018 1021   Last data filed at 08/30/2018 0553     Gross per 24 hour        Intake  2415 ml        Output  1700 ml        Net  715 ml          Telemetry Reviewed:       PHYSICAL EXAM:   General: NAD   B/L PCN-> hematuria   No edema         Lab Data Reviewed: (see below)      Medications Reviewed: (see below)      PMH/SH reviewed - no change compared to H&P   ________________________________________________________________________   Care Plan discussed with:       Patient  Y           Family              RN  Y           Care Manager                            Consultant:                     Comments         >50% of visit spent in counseling and coordination of care            ________________________________________________________________________   Sadae Arrazola Sherrye Payor, MD       Procedures: see electronic medical records for all procedures/Xrays and details which   were not copied into this note but were reviewed prior to creation of Plan.        LABS:     Recent Labs            08/30/18   0028  08/29/18   0039     WBC  9.2  9.5     HGB  7.0*  7.9*     HCT  21.4*  23.9*         PLT  213  216          Recent Labs            08/30/18   0028  08/29/18   0039     NA  135*  136     K  3.7  3.9     CL  100  99     CO2  25  26     BUN  55*  73*     CREA  3.25*  6.02*     GLU  126*  116*         CA  9.0  9.4        No results for input(s): SGOT, GPT, AP, TBIL, TP, ALB, GLOB, GGT, AML, LPSE in the last 72 hours.      No lab  exists for component: AMYP, HLPSE     Recent Labs             08/30/18   0028  08/29/18   1816  08/29/18   0852          APTT  55.8*  58.1*  47.9*         No results for input(s): FE, TIBC, PSAT, FERR in the last 72 hours.    No results found for: FOL, RBCF    No results for input(s): PH, PCO2, PO2 in the last 72 hours.   No results for input(s): CPK, CKMB in the last 72 hours.      No lab exists for component: TROPONINI   No components found for: [facility]     Lab Results         Component  Value  Date/Time            Color  YELLOW/STRAW  08/26/2018 02:25 PM       Appearance  CLEAR  08/26/2018 02:25 PM       Specific gravity  1.012  08/26/2018 02:25 PM        Specific gravity  1.025  04/22/2016 08:22 PM       pH (UA)  5.0  08/26/2018 02:25 PM       Protein  NEGATIVE   08/26/2018 02:25 PM       Glucose  NEGATIVE   08/26/2018 02:25 PM       Ketone  NEGATIVE   08/26/2018 02:25 PM       Bilirubin  NEGATIVE   08/26/2018 02:25 PM       Urobilinogen  0.2  08/26/2018 02:25 PM       Nitrites  NEGATIVE   08/26/2018 02:25 PM       Leukocyte Esterase  NEGATIVE   08/26/2018 02:25 PM       Epithelial cells  FEW  08/26/2018 02:25 PM       Bacteria  NEGATIVE   08/26/2018 02:25 PM       WBC  0-4  08/26/2018 02:25 PM            RBC  0-5  08/26/2018 02:25 PM           MEDICATIONS:     Current Facility-Administered Medications          Medication  Dose  Route  Frequency           ?  acetaminophen (TYLENOL) tablet 650 mg   650 mg  Oral  Q4H PRN     ?  0.9% sodium chloride infusion   75 mL/hr  IntraVENous  CONTINUOUS           ?  heparin 25,000 units in D5W 250 ml infusion   18-36 Units/kg/hr  IntraVENous  TITRATE           ?  predniSONE (DELTASONE) tablet 5 mg   5 mg  Oral  DAILY     ?  senna (SENOKOT) tablet 8.6 mg   1 Tab  Oral  QHS     ?  sodium chloride (NS) flush 5-40 mL   5-40 mL  IntraVENous  Q8H     ?  sodium chloride (NS) flush 5-40 mL   5-40 mL  IntraVENous  PRN     ?  LORazepam (ATIVAN) tablet 0.5 mg   0.5 mg  Oral  BID PRN     ?  oxyCODONE IR (ROXICODONE) tablet 5-10  mg   5-10 mg  Oral  Q4H PRN           ?  ondansetron (ZOFRAN) injection 4 mg   4 mg  IntraVENous  Q4H PRN

## 2018-08-30 NOTE — Progress Notes (Signed)
TOC  -Noted transfer to [facility] monitoring for needs  -Family will transport at discharge.     Gerarda Gunther, MHA/CRM

## 2018-08-30 NOTE — Progress Notes (Signed)
Problem: Falls - Risk of  Goal: *Absence of Falls  Description  Document [name] Fall Risk and appropriate interventions in the flowsheet.  Outcome: Progressing Towards Goal  Note:   Fall Risk Interventions:  Mobility Interventions: Communicate number of staff needed for ambulation/transfer, Patient to call before getting OOB    Medication Interventions: Evaluate medications/consider consulting pharmacy, Patient to call before getting OOB, Teach patient to arise slowly    Elimination Interventions: Call light in reach, Patient to call for help with toileting needs       Problem: Pressure Injury - Risk of  Goal: *Prevention of pressure injury  Description  Document Braden Scale and appropriate interventions in the flowsheet.  Outcome: Progressing Towards Goal  Note:   Pressure Injury Interventions:    Activity Interventions: Increase time out of bed, Pressure redistribution bed/mattress(bed type)    Mobility Interventions: Float heels    Nutrition Interventions: Document food/fluid/supplement intake, Offer support with meals,snacks and hydration    Friction and Shear Interventions: Lift sheet, Minimize layers

## 2018-08-30 NOTE — Progress Notes (Signed)
[facility]                                                                                          Hospitalist Progress Note  Emilio Math, MD  Answering service: [phone] from in house phone        Date of Service:  2018/09/07  NAME:  Bruce Clark  DOB:  [birth year]  MRN:  [id number]      Admission Summary:   Admitted weak and debilitated for eval insetting of lung cancer and prostate cancer     Interval history / Subjective:       2018-09-07 :  Had bowel movement  Cr down to 3 from 8  No UO.   Hem/onc considering palliative chemo post discharge.          Assessment & Plan:     ?? Obst uropathy ( 800 cc's post void residual w foley left in place), urology to follow CT abd/pelvis  on order  - for bil PERC's ; accomplished in 4 renal numbers in a.m.   ?? AKI - for nephrology input and avoiding nephrotoxic's and close following  Cr  Yet up; will follow up daily   Lab Results   Component Value Date/Time    Creatinine 3.25 (H) 07-Sep-2018 12:28 AM      Lab Results   Component Value Date/Time    Potassium 3.7 2018-09-07 12:28 AM      ?? DVT - for heparin drip, (failed Eliquis taken up to admission) heparin to be cont  And ? lovenox on discharge if renals allow.   ?? Anemia for cont monitoring and f/u hematopoetics   Lab Results   Component Value Date/Time    Iron 24 (L) 10/09/2017 08:25 AM    TIBC 131 (L) 10/09/2017 08:25 AM    Iron % saturation 18 (L) 10/09/2017 08:25 AM    Ferritin 1,162 (H) 10/09/2017 08:25 AM      Lab Results   Component Value Date/Time    Vitamin B12 371 10/09/2017 08:25 AM     Lab Results   Component Value Date/Time    HGB 7.0 (L) 09/07/18 12:28 AM      ?? Elevated BNP no hf by exam   ?? Prostate cancer on chemo followed by Dr Melony Overly   ?? H/o stage 3 lung cancer post resection and rxt followed by Dr. Melony Overly   ?? DNR status (confirmed with pt on admissions)   ?? Clinically insignificant low serum sodium   ?? Low wt status suppliments    ?? Ambulation difficulties   Code status: DNR   DVT prophylaxis: on hep drip between proceedures     Care Plan discussed with: Patient/Family and Nurse consultants 9 hem [name]   Disposition: TBD     Hospital Problems  Date Reviewed: 2018-09-07          Codes Class Noted POA    * (Principal) AKI (acute kidney injury) ([place]) ICD-10-CM: N17.9  ICD-9-CM: 584.9  08/26/2018 Unknown  Review of Systems:   Pertinent items are noted in HPI.  Fortunately no pain.      Vital Signs:    Last 24hrs VS reviewed since prior progress note. Most recent are:  Visit Vitals  BP 119/74 (BP 1 Location: Right arm, BP Patient Position: At rest)   Pulse 95   Temp 98.7 ??F (37.1 ??C)   Resp 18   Wt 55.6 kg (122 lb 9.2 oz)   SpO2 99%   BMI 19.20 kg/m??         Intake/Output Summary (Last 24 hours) at 08/30/2018 1434  Last data filed at 08/30/2018 0553  Gross per 24 hour   Intake 2355 ml   Output 1300 ml   Net 1055 ml        Physical Examination:           Stable exam  Emaciated.   Constitutional:  No acute distress, weak, debilitated, and  Emaciated, appearing. Else cooperative, pleasant    ENT:  Oral mucous moist, oropharynx benign.    Resp:  CTA bilaterally. No wheezing/rhonchi/rales. No accessory muscle use   CV:  Regular rhythm, normal rate, no  gallops, rubs    GI:  Soft, non distended, non tender. normoactive bowel sounds, no hepatosplenomegaly     Musculoskeletal:  trace + rt edema > lt  edema, warm,      Neurologic:  Moves all extremities.  AAOx3, CN II-XII reviewed     Psych:  Good insight, Not anxious nor agitated.  Skin:  Good turgor, no rashes or ulcers       Data Review:    Review and/or order of clinical lab test      Labs:     Recent Labs     08/30/18  0028 08/29/18  0039   WBC 9.2 9.5   HGB 7.0* 7.9*   HCT 21.4* 23.9*   PLT 213 216     Recent Labs     08/30/18  0028 08/29/18  0039   NA 135* 136   K 3.7 3.9   CL 100 99   CO2 25 26   BUN 55* 73*   CREA 3.25* 6.02*   GLU 126* 116*   CA 9.0 9.4      No results for input(s): SGOT, GPT, ALT, AP, TBIL, TBILI, TP, ALB, GLOB, GGT, AML, LPSE in the last 72 hours.    No lab exists for component: AMYP, HLPSE  Recent Labs     08/30/18  0937 08/30/18  0028 08/29/18  1816   APTT 58.4* 55.8* 58.1*      No results for input(s): FE, TIBC, PSAT, FERR in the last 72 hours.   No results found for: FOL, RBCF   No results for input(s): PH, PCO2, PO2 in the last 72 hours.  No results for input(s): CPK, CKNDX, TROIQ in the last 72 hours.    No lab exists for component: CPKMB  Lab Results   Component Value Date/Time    Cholesterol, total 184 07/01/2016 10:50 PM    HDL Cholesterol 64 07/01/2016 10:50 PM    LDL, calculated 109.2 (H) 07/01/2016 10:50 PM    Triglyceride 54 07/01/2016 10:50 PM    CHOL/HDL Ratio 2.9 07/01/2016 10:50 PM     Lab Results   Component Value Date/Time    Glucose (POC) 103 (H) 04/28/2016 10:31 AM    Glucose (POC) 90 04/26/2016 10:33 PM    Glucose (POC) 71 04/26/2016 05:12 PM    Glucose (  POC) 106 (H) 04/26/2016 12:03 PM    Glucose (POC) 117 (H) 04/25/2016 04:52 PM     Lab Results   Component Value Date/Time    Color YELLOW/STRAW 08/26/2018 02:25 PM    Appearance CLEAR 08/26/2018 02:25 PM    Specific gravity 1.012 08/26/2018 02:25 PM    Specific gravity 1.025 04/22/2016 08:22 PM    pH (UA) 5.0 08/26/2018 02:25 PM    Protein NEGATIVE  08/26/2018 02:25 PM    Glucose NEGATIVE  08/26/2018 02:25 PM    Ketone NEGATIVE  08/26/2018 02:25 PM    Bilirubin NEGATIVE  08/26/2018 02:25 PM    Urobilinogen 0.2 08/26/2018 02:25 PM    Nitrites NEGATIVE  08/26/2018 02:25 PM    Leukocyte Esterase NEGATIVE  08/26/2018 02:25 PM    Epithelial cells FEW 08/26/2018 02:25 PM    Bacteria NEGATIVE  08/26/2018 02:25 PM    WBC 0-4 08/26/2018 02:25 PM    RBC 0-5 08/26/2018 02:25 PM         Medications Reviewed:     Current Facility-Administered Medications   Medication Dose Route Frequency   ??? acetaminophen (TYLENOL) tablet 650 mg  650 mg Oral Q4H PRN    ??? 0.9% sodium chloride infusion  75 mL/hr IntraVENous CONTINUOUS   ??? heparin 25,000 units in D5W 250 ml infusion  18-36 Units/kg/hr IntraVENous TITRATE   ??? predniSONE (DELTASONE) tablet 5 mg  5 mg Oral DAILY   ??? senna (SENOKOT) tablet 8.6 mg  1 Tab Oral QHS   ??? sodium chloride (NS) flush 5-40 mL  5-40 mL IntraVENous Q8H   ??? sodium chloride (NS) flush 5-40 mL  5-40 mL IntraVENous PRN   ??? LORazepam (ATIVAN) tablet 0.5 mg  0.5 mg Oral BID PRN   ??? oxyCODONE IR (ROXICODONE) tablet 5-10 mg  5-10 mg Oral Q4H PRN   ??? ondansetron (ZOFRAN) injection 4 mg  4 mg IntraVENous Q4H PRN     ______________________________________________________________________  EXPECTED LENGTH OF STAY: 3d 4h  ACTUAL LENGTH OF STAY:          4                 Emilio Math, MD

## 2018-08-30 NOTE — Progress Notes (Signed)
NAME: Bruce Clark        DOB:  04-06-1951        MRN:  [id number]        Assessment :    Plan:  --AKI- due to obstructive uropathy. UA is bland. No other cause to explain AKI. Basln Cr normal in Nov 2019  Hyponatremia  Obstructive Uropathy- likely due to prostate cancer  Metastatic Prostate and Lung Cancer  Liver mets  HTN --Creatinine much better at 3.2mg /dl.  Not uremic.  Potassium OK. Has bilateral PCN's.  Fair UO.      Poor po    Hgb down to 7. Trend    D/W RN         Subjective:     Chief Complaint:  No events overnight.   B/L PCN draining. R>L. No N/V  No pain.     Review of Systems:    Symptom Y/N Comments  Symptom Y/N Comments   Fever/Chills    Chest Pain     Poor Appetite    Edema     Cough    Abdominal Pain     Sputum    Joint Pain     SOB/DOE    Pruritis/Rash     Nausea/vomit    Tolerating PT/OT     Diarrhea    Tolerating Diet     Constipation    Other       Could not obtain due to:      Objective:     VITALS:   Last 24hrs VS reviewed since prior progress note. Most recent are:  Visit Vitals  BP 119/74 (BP 1 Location: Right arm, BP Patient Position: At rest)   Pulse 95   Temp 98.7 ??F (37.1 ??C)   Resp 18   Wt 55.6 kg (122 lb 9.2 oz)   SpO2 99%   BMI 19.20 kg/m??       Intake/Output Summary (Last 24 hours) at 08/30/2018 1021  Last data filed at 08/30/2018 0553  Gross per 24 hour   Intake 2415 ml   Output 1700 ml   Net 715 ml      Telemetry Reviewed:     PHYSICAL EXAM:  General: NAD  B/L PCN-> hematuria  No edema      Lab Data Reviewed: (see below)    Medications Reviewed: (see below)    PMH/SH reviewed - no change compared to H&P  ________________________________________________________________________  Care Plan discussed with:  Patient Y    Family      RN Y    Care Manager                    Consultant:          Comments   >50% of visit spent in counseling and coordination of care        ________________________________________________________________________  Rogan Wigley Sherrye Payor, MD     Procedures: see electronic medical records for all procedures/Xrays and details which  were not copied into this note but were reviewed prior to creation of Plan.      LABS:  Recent Labs     08/30/18  0028 08/29/18  0039   WBC 9.2 9.5   HGB 7.0* 7.9*   HCT 21.4* 23.9*   PLT 213 216     Recent Labs     08/30/18  0028 08/29/18  0039   NA 135* 136   K 3.7 3.9   CL 100 99   CO2 25  26   BUN 55* 73*   CREA 3.25* 6.02*   GLU 126* 116*   CA 9.0 9.4     No results for input(s): SGOT, GPT, AP, TBIL, TP, ALB, GLOB, GGT, AML, LPSE in the last 72 hours.    No lab exists for component: AMYP, HLPSE  Recent Labs     08/30/18  0028 08/29/18  1816 08/29/18  0852   APTT 55.8* 58.1* 47.9*      No results for input(s): FE, TIBC, PSAT, FERR in the last 72 hours.   No results found for: FOL, RBCF   No results for input(s): PH, PCO2, PO2 in the last 72 hours.  No results for input(s): CPK, CKMB in the last 72 hours.    No lab exists for component: TROPONINI  No components found for: [facility]  Lab Results   Component Value Date/Time    Color YELLOW/STRAW 08/26/2018 02:25 PM    Appearance CLEAR 08/26/2018 02:25 PM    Specific gravity 1.012 08/26/2018 02:25 PM    Specific gravity 1.025 04/22/2016 08:22 PM    pH (UA) 5.0 08/26/2018 02:25 PM    Protein NEGATIVE  08/26/2018 02:25 PM    Glucose NEGATIVE  08/26/2018 02:25 PM    Ketone NEGATIVE  08/26/2018 02:25 PM    Bilirubin NEGATIVE  08/26/2018 02:25 PM    Urobilinogen 0.2 08/26/2018 02:25 PM    Nitrites NEGATIVE  08/26/2018 02:25 PM    Leukocyte Esterase NEGATIVE  08/26/2018 02:25 PM    Epithelial cells FEW 08/26/2018 02:25 PM    Bacteria NEGATIVE  08/26/2018 02:25 PM    WBC 0-4 08/26/2018 02:25 PM    RBC 0-5 08/26/2018 02:25 PM       MEDICATIONS:  Current Facility-Administered Medications   Medication Dose Route Frequency   ??? acetaminophen (TYLENOL) tablet 650 mg  650 mg Oral Q4H PRN    ??? 0.9% sodium chloride infusion  75 mL/hr IntraVENous CONTINUOUS   ??? heparin 25,000 units in D5W 250 ml infusion  18-36 Units/kg/hr IntraVENous TITRATE   ??? predniSONE (DELTASONE) tablet 5 mg  5 mg Oral DAILY   ??? senna (SENOKOT) tablet 8.6 mg  1 Tab Oral QHS   ??? sodium chloride (NS) flush 5-40 mL  5-40 mL IntraVENous Q8H   ??? sodium chloride (NS) flush 5-40 mL  5-40 mL IntraVENous PRN   ??? LORazepam (ATIVAN) tablet 0.5 mg  0.5 mg Oral BID PRN   ??? oxyCODONE IR (ROXICODONE) tablet 5-10 mg  5-10 mg Oral Q4H PRN   ??? ondansetron (ZOFRAN) injection 4 mg  4 mg IntraVENous Q4H PRN

## 2018-08-30 NOTE — Progress Notes (Signed)
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 male with stage IV prostate and now stage IV NSCLC admitted with acute renal failure, R extensive DVT and R chest wall pain    Interval history:  Pain has improved. Has hematuria in b/l PCN tubes. No longer has foley and has had no urine output. Denies other sites of bleeding. Right leg swelling improved per patient. No fevers in last 24 hours. Denies nausea/vomiting but has poor PO intake. No other complaints.    Past Medical History:   Diagnosis Date   ??? Lung cancer ([place])    ??? Prostate cancer ([place])    ??? Stroke ([place])     PT STATES, "I HAD A STROKE A LONG TIME AGO"       Past Surgical History:   Procedure Laterality Date   ??? CHEST SURGERY PROCEDURE UNLISTED  04/27/2017    BRONCHOSCOPY/MEDIASTINOSCOPY   ??? HX GI      HERNIA REPAIR   ??? HX ORTHOPAEDIC  1994    ROD IN RIGHT LEG   ??? IR NEPHROSTOMY PERC LT PLC CATH  [place]  08/27/2018   ??? IR NEPHROSTOMY PERC RT PLC CATH  [place]  08/27/2018       No Known Allergies    Current Facility-Administered Medications   Medication Dose Route Frequency Provider Last Rate Last Dose   ??? acetaminophen (TYLENOL) tablet 650 mg  650 mg Oral Q4H PRN Bruce Duffel T, MD   650 mg at 08/29/18 2104   ??? 0.9% sodium chloride infusion  75 mL/hr IntraVENous CONTINUOUS Bruce Hammers, MD 75 mL/hr at 08/30/18 0302 75 mL/hr at 08/30/18 0302   ??? heparin 25,000 units in D5W 250 ml infusion  18-36 Units/kg/hr IntraVENous TITRATE Bruce Math, MD 12.5 mL/hr at 08/30/18 0815 19 Units/kg/hr at 08/30/18 0815   ??? predniSONE (DELTASONE) tablet 5 mg  5 mg Oral DAILY Bruce Math, MD   5 mg at 08/30/18 0912   ??? senna (SENOKOT) tablet 8.6 mg  1 Tab Oral QHS Bruce Math, MD   8.6 mg at 08/29/18 2104   ??? sodium chloride (NS) flush 5-40 mL  5-40 mL IntraVENous Q8H Bruce Math, MD   10 mL at 08/29/18 2104   ??? sodium chloride (NS) flush 5-40 mL  5-40 mL IntraVENous PRN Bruce Math, MD       ??? LORazepam (ATIVAN) tablet 0.5 mg  0.5 mg Oral BID PRN Bruce Math, MD       ??? oxyCODONE IR (ROXICODONE) tablet 5-10 mg  5-10 mg Oral Q4H PRN Bruce Math, MD   10 mg at 08/28/18 0902   ??? ondansetron (ZOFRAN) injection 4 mg  4 mg IntraVENous Q4H PRN Bruce Math, MD           Social History     Socioeconomic History   ??? Marital status: SINGLE     Spouse name: Not on file   ??? Number of children: Not on file   ??? Years of education: Not on file   ??? Highest education level: Not on file   Tobacco Use   ??? Smoking status: Former Smoker     Packs/day: 0.50     Years: 47.00     Pack years:  23.50     Last attempt to quit: 06/2016     Years since quitting: 2.2   ??? Smokeless tobacco: Never Used   Substance and Sexual Activity   ??? Alcohol use: No   ??? Drug use: No       Family History   Problem Relation Age of Onset   ??? Cancer Mother         COLON   ??? Cancer Father         BRAIN TUMOR   ??? No Known Problems Sister    ??? Arthritis-osteo Brother    ??? No Known Problems Sister    ??? No Known Problems Sister    ??? No Known Problems Sister    ??? No Known Problems Brother    ??? Anesth Problems Neg Hx      ROS  A review of systems was obtained and is negative except as listed in HPI.  ECOG PS is 2    Physical Examination:   Visit Vitals  BP 119/74 (BP 1 Location: Right arm, BP Patient Position: At rest)   Pulse 95   Temp 98.7 ??F (37.1 ??C)   Resp 18   Wt 122 lb 9.2 oz (55.6 kg) Comment: NO DRAINS HANGING FROM BED   SpO2 99%   BMI 19.20 kg/m??     General appearance - alert, appears fatigued and ill  Mental status - oriented to person, place, and time  Mouth - mucous membranes moist, no lesions noted  Neck - supple, no significant adenopathy  ABD- soft. NTTP. Bilateral PCN with bloody urine.   Extremities - peripheral pulses normal, 1-2+ right edema       LABS  Lab Results   Component Value Date/Time     WBC 9.2 08/30/2018 12:28 AM    HGB 7.0 (L) 08/30/2018 12:28 AM    HCT 21.4 (L) 08/30/2018 12:28 AM    PLATELET 213 08/30/2018 12:28 AM    MCV 86.6 08/30/2018 12:28 AM    ABS. NEUTROPHILS 7.9 08/30/2018 12:28 AM     Lab Results   Component Value Date/Time    Sodium 135 (L) 08/30/2018 12:28 AM    Potassium 3.7 08/30/2018 12:28 AM    Chloride 100 08/30/2018 12:28 AM    CO2 25 08/30/2018 12:28 AM    Glucose 126 (H) 08/30/2018 12:28 AM    BUN 55 (H) 08/30/2018 12:28 AM    Creatinine 3.25 (H) 08/30/2018 12:28 AM    GFR est AA 23 (L) 08/30/2018 12:28 AM    GFR est non-AA 19 (L) 08/30/2018 12:28 AM    Calcium 9.0 08/30/2018 12:28 AM     Lab Results   Component Value Date/Time    AST (SGOT) 26 08/26/2018 11:41 AM    Alk. phosphatase 88 08/26/2018 11:41 AM    Protein, total 8.2 08/26/2018 11:41 AM    Albumin 2.8 (L) 08/26/2018 11:41 AM    Globulin 5.4 (H) 08/26/2018 11:41 AM    A-G Ratio 0.5 (L) 08/26/2018 11:41 AM     Recent Labs     08/26/18  1141 07/05/18  0850 05/11/18  0959 03/24/18  0949 02/17/18  0851 01/01/18  0811 11/27/17  0806   PSA 0.1 0.0*  --  0.1 0.1 0.1 0.1   PSALT  --   --  <0.1  --   --   --   --        IMAGING  PET CT  03/23/17  IMPRESSION: Mass lesion right  lower lobe is hypermetabolic and compatible with  the biopsy confirmed the result of adenocarcinoma. There are soft tissue  densities in the anterior abdominal wall bilaterally which are stable compared  to the prior chest abdomen pelvis CT but new compared to the prior PET/CT.  Please correlate with any recent abdominal wall surgery as these may represent  port sites. Otherwise normal tracer distribution. Stable sclerotic osseous  metastatic disease without abnormal activity.    PATHOLOGY  Supraclavicular node   CYTOLOGIC INTERPRETATION:   Adenocarcinoma, consistent with prostate primary   See comment   General Categorization   Positive for malignancy.   Specimen Adequacy   Satisfactory for evaluation.   Comment    Touch preps and a core biopsy are examined and show islands of adenocarcinoma surrounded by fibrous stroma. A panel of immunohistochemical stains was performed to evaluate site of origin. The tumor cells are focally positive for PSA and PSAP and negative for CK7, CK20, TTF-1    CT CAP 03/22/18  IMPRESSION  IMPRESSION:   1. Status post right lung surgery with right perihilar consolidation and  scarring consistent with post surgery and radiation therapy change. These  findings are stable.  2. Enlarged prostate.  3. Sclerotic bone metastases unchanged.  4. Atherosclerotic aorta without aneurysm    CT CAP 07/13/18:   IMPRESSION:   1. Increased size of the prostate and increased nodularity involving the bladder  base and bladder wall.  2. New left hydronephrosis and hydroureter with obstruction at the level of the  ureterovesical junction.  3. Status post right lung surgery with elevation of the right hemidiaphragm and  surgical staples and scarring in the right hilum consistent with postsurgical  and postradiation therapy effect, unchanged.  4. Sclerotic bone metastases to the spine unchanged.  5. New low-attenuation liver lesion could represent a metastasis.  6. Dilated fluid-filled esophagus suggesting esophageal dysmotility with  thickened wall possibly secondary to radiation therapy.  7. Atherosclerotic aorta with coronary artery calcifications. No abdominal  aortic aneurysm.    CT 08/26/2018    IMPRESSION  IMPRESSION:  ??  1. Unchanged appearance of right hemithorax as above.  2. Increased number and size of hypoattenuating lesions of the liver consistent  with worsening hepatic metastatic disease.  3. Bilateral renal pelvocaliectasis and ureteral distention, new on the right  and increased on the left.  4. Foley catheter within urinary bladder which cannot be further assessed.  5. Osteosclerotic metastatic disease.  6. Anasarca appearance demonstrated in the interval.   ??  Pathology from liver biopsy 08/17/18:    Specimen Source   1: Liver, Core biopsy with touch intepretation:   CYTOLOGIC INTERPRETATION:   1. Liver, Core biopsy with touch intepretation:   Poorly differentiated adenocarcinoma consistent with metastatic pulmonary adenocarcinoma   See comment   General Categorization   Positive for malignancy.       ASSESSMENT  Mr. Escamilla is a 68 y.o. male with widely metastatic prostate adenocarcinoma and lung adenocarcinoma status post right lower lobe lobectomy on 05/22/17. Most recent imaging concerning for progression with new liver mets. He is admitted for acute renal failure    PLAN    Acute renal failure  Post obstructive  CT with Bilateral renal pelvocaliectasis and ureteral distention, new on the right  and increased on the left- likely due to previously seen progressive prostate cancer possibly infiltrating the bladder  PCN bilaterally 08/27/18  Urology and Nephrology inputs are appreciated  Ideally would be nice to have a  cystoscopy to understand the intravesical disease and r/o a new bladder primary but at the moment not feasible as he is on anticoagulation  Cr much improved today    Lower extremity edema and h/o DVT  Diagnosed 04/22/16  Cancer associated  On lifelong Eliquis 57m BID   Now with marked RLE  Venous duplex obtained+DVT   On Heparin gtt for now    If GFR improves will consider Lovenox    Hgb again dropped to 7 today likely d/Clark ongoing hematuria   Will continue to monitor today, if Hgb drops again tomorrow we may need to consider IVC filter and holding AC     Prostate cancer stage IV  Castrate sensitive, treatment na??ve widely metastatic prostate cancer to lymph nodes above and below the diaphragm and bones.  Prostate adenocarcinoma diagnosed on biopsy of the supraclavicular lymph node. Discussed in tumor board and a small cell component has been ruled out.  PSA at the time of diagnosis at 2400.  ??  Currently on Lupron 45 mg every 6 months. On Zytiga 1,0049mdaily +  prednisone 5 mg daily which has controlled his disease now for > 2 years  Though his PSA is still undetectable at 0.1 his prior scans especially the bladder masses suggest progressive prostate cancer and probable castrate resistance  We will hold Zytiga inpatient   [facility] and [facility]  R lung adenocarcinoma, [id number] stage IIIB- Parietal pleural involvement- PD-L1 99%  S/p RLL lobectomy on 05/22/17 then received PORT  Now with biopsy proven liver metastases  Repeat CT CAP pending  Plan on palliative Keytruda OP      Bone metastases  Diffuse  Completed palliative radiation that was initiated on 06/09/16      Right sided chest wall pain  Unclear cause  No corresponding mets  Controlled    Anemia   Due to progressive cancer, renal failure, hematuria  Transfuse if < 7 g/dl    Will continue to follow   D/w Dr. RaMelony Overly

## 2018-08-30 NOTE — Progress Notes (Signed)
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 male with stage IV  prostate and now stage IV NSCLC admitted with acute renal failure, R extensive DVT and R chest wall pain      Interval history:   Pain has improved. Has hematuria in b/l PCN tubes. No longer has foley and has had no urine output. Denies other sites of bleeding. Right leg swelling improved per patient. No fevers in last 24 hours.  Denies nausea/vomiting but has poor PO intake. No other complaints.        Past Medical History:        Diagnosis  Date         ?  Lung cancer ([place])       ?  Prostate cancer ([place])       ?  Stroke [facility])            PT STATES, "I HAD A STROKE A LONG TIME AGO"             Past Surgical History:         Procedure  Laterality  Date          ?  CHEST SURGERY PROCEDURE UNLISTED    04/27/2017          BRONCHOSCOPY/MEDIASTINOSCOPY          ?  HX GI              HERNIA REPAIR          ?  HX ORTHOPAEDIC    1994          ROD IN RIGHT LEG          ?  IR NEPHROSTOMY PERC LT PLC CATH  SI    08/27/2018          ?  IR NEPHROSTOMY PERC RT PLC CATH  SI    08/27/2018           No Known Allergies        Current Facility-Administered Medications             Medication  Dose  Route  Frequency  Provider  Last Rate  Last Dose              ?  acetaminophen (TYLENOL) tablet 650 mg   650 mg  Oral  Q4H PRN  Abigail Miyamoto, MD     650 mg at 08/29/18 2104              ?  0.9% sodium chloride infusion   75 mL/hr  IntraVENous  CONTINUOUS  Rosalia Hammers, MD  75 mL/hr at 08/30/18 0302  75 mL/hr at  08/30/18 0302     ?  heparin 25,000 units in D5W 250 ml infusion   18-36 Units/kg/hr  IntraVENous  TITRATE  Emilio Math, MD  12.5 mL/hr at 08/30/18 0815  19 Units/kg/hr at 08/30/18 0815     ?  predniSONE (DELTASONE) tablet 5 mg   5 mg  Oral  DAILY  Emilio Math, MD     5 mg at 08/30/18 2841     ?  senna (SENOKOT) tablet 8.6 mg   1 Tab  Oral  QHS  Emilio Math, MD     8.6 mg at 08/29/18 2104     ?  sodium chloride (NS) flush 5-40 mL   5-40 mL  IntraVENous  Q8H  Emilio Math, MD     10 mL at 08/29/18 2104     ?  sodium chloride (NS) flush 5-40 mL   5-40 mL  IntraVENous  PRN  Emilio Math, MD           ?  LORazepam (ATIVAN) tablet 0.5 mg   0.5 mg  Oral  BID PRN  Emilio Math, MD           ?  oxyCODONE IR (ROXICODONE) tablet 5-10 mg   5-10 mg  Oral  Q4H PRN  Emilio Math, MD     10 mg at 08/28/18 0902              ?  ondansetron (ZOFRAN) injection 4 mg   4 mg  IntraVENous  Q4H PRN  Emilio Math, MD                   Social History          Socioeconomic History         ?  Marital status:  SINGLE              Spouse name:  Not on file         ?  Number of children:  Not on file     ?  Years of education:  Not on file     ?  Highest education level:  Not on file       Tobacco Use         ?  Smoking status:  Former Smoker              Packs/day:  0.50         Years:  47.00         Pack years:  23.50         Last attempt to quit:  06/2016         Years since quitting:  2.2         ?  Smokeless tobacco:  Never Used       Substance and Sexual Activity         ?  Alcohol use:  No         ?  Drug use:  No             Family History         Problem  Relation  Age of Onset          ?  Cancer  Mother                COLON          ?  Cancer  Father  BRAIN TUMOR          ?  No Known Problems  Sister       ?  Arthritis-osteo  Brother       ?  No Known Problems  Sister       ?  No Known Problems  Sister       ?  No Known Problems  Sister       ?  No Known  Problems  Brother            ?  Anesth Problems  Neg Hx          ROS   A review of systems was obtained and is negative except as listed in HPI.   ECOG PS is 2      Physical Examination:    Visit Vitals      BP  119/74 (BP 1 Location: Right arm, BP Patient Position: At rest)     Pulse  95     Temp  98.7 ??F (37.1 ??C)     Resp  18         Wt  122 lb 9.2 oz (55.6 kg)  Comment: NO DRAINS HANGING FROM BED        SpO2  99%        BMI  19.20 kg/m??        General appearance - alert, appears fatigued and ill   Mental status - oriented to person, place, and time   Mouth - mucous membranes moist, no lesions noted   Neck - supple, no significant adenopathy   ABD- soft. NTTP. Bilateral PCN with bloody urine.    Extremities - peripheral pulses normal, 1-2+ right edema          LABS     Lab Results         Component  Value  Date/Time            WBC  9.2  08/30/2018 12:28 AM       HGB  7.0 (L)  08/30/2018 12:28 AM       HCT  21.4 (L)  08/30/2018 12:28 AM       PLATELET  213  08/30/2018 12:28 AM       MCV  86.6  08/30/2018 12:28 AM            ABS. NEUTROPHILS  7.9  08/30/2018 12:28 AM          Lab Results         Component  Value  Date/Time            Sodium  135 (L)  08/30/2018 12:28 AM       Potassium  3.7  08/30/2018 12:28 AM       Chloride  100  08/30/2018 12:28 AM       CO2  25  08/30/2018 12:28 AM       Glucose  126 (H)  08/30/2018 12:28 AM       BUN  55 (H)  08/30/2018 12:28 AM       Creatinine  3.25 (H)  08/30/2018 12:28 AM       GFR est AA  23 (L)  08/30/2018 12:28 AM       GFR est non-AA  19 (L)  08/30/2018 12:28 AM            Calcium  9.0  08/30/2018 12:28 AM          Lab Results  Component  Value  Date/Time            AST (SGOT)  26  08/26/2018 11:41 AM       Alk. phosphatase  88  08/26/2018 11:41 AM       Protein, total  8.2  08/26/2018 11:41 AM       Albumin  2.8 (L)  08/26/2018 11:41 AM       Globulin  5.4 (H)  08/26/2018 11:41 AM            A-G Ratio  0.5 (L)  08/26/2018 11:41 AM          Recent Labs                  08/26/18   1141  07/05/18   0850  05/11/18   0959  03/24/18   0949  02/17/18   0851  01/01/18   0811  11/27/17   0806     PSA  0.1  0.0*   --   0.1  0.1  0.1  0.1              PSALT   --    --   <0.1   --    --    --    --            IMAGING   PET CT  03/23/17   IMPRESSION: Mass lesion right lower lobe is hypermetabolic and compatible with   the biopsy confirmed the result of adenocarcinoma. There are soft tissue   densities in the anterior abdominal wall bilaterally which are stable compared   to the prior chest abdomen pelvis CT but new compared to the prior PET/CT.   Please correlate with any recent abdominal wall surgery as these may represent   port sites. Otherwise normal tracer distribution. Stable sclerotic osseous   metastatic disease without abnormal activity.      PATHOLOGY   Supraclavicular node    CYTOLOGIC INTERPRETATION:    Adenocarcinoma, consistent with prostate primary    See comment    General Categorization    Positive for malignancy.    Specimen Adequacy    Satisfactory for evaluation.    Comment    Touch preps and a core biopsy are examined and show islands of adenocarcinoma surrounded by fibrous stroma. A panel of immunohistochemical stains was performed  to evaluate site of origin. The tumor cells are focally positive for PSA and PSAP and negative for CK7, CK20, TTF-1      CT CAP 03/22/18   IMPRESSION   IMPRESSION:    1. Status post right lung surgery with right perihilar consolidation and   scarring consistent with post surgery and radiation therapy change. These   findings are stable.   2. Enlarged prostate.   3. Sclerotic bone metastases unchanged.   4. Atherosclerotic aorta without aneurysm      CT CAP 07/13/18:    IMPRESSION:    1. Increased size of the prostate and increased nodularity involving the bladder   base and bladder wall.   2. New left hydronephrosis and hydroureter with obstruction at the level of the   ureterovesical junction.   3. Status post right lung surgery with  elevation of the right hemidiaphragm and   surgical staples and scarring in the right hilum consistent with postsurgical   and postradiation therapy effect, unchanged.   4. Sclerotic bone metastases to the spine unchanged.   5. New low-attenuation liver lesion could  represent a metastasis.   6. Dilated fluid-filled esophagus suggesting esophageal dysmotility with   thickened wall possibly secondary to radiation therapy.   7. Atherosclerotic aorta with coronary artery calcifications. No abdominal   aortic aneurysm.      CT 08/26/2018      IMPRESSION   IMPRESSION:   ??   1. Unchanged appearance of right hemithorax as above.   2. Increased number and size of hypoattenuating lesions of the liver consistent   with worsening hepatic metastatic disease.   3. Bilateral renal pelvocaliectasis and ureteral distention, new on the right   and increased on the left.   4. Foley catheter within urinary bladder which cannot be further assessed.   5. Osteosclerotic metastatic disease.   6. Anasarca appearance demonstrated in the interval.    ??   Pathology from liver biopsy 08/17/18:    Specimen Source    1: Liver, Core biopsy with touch intepretation:    CYTOLOGIC INTERPRETATION:    1. Liver, Core biopsy with touch intepretation:    Poorly differentiated adenocarcinoma consistent with metastatic pulmonary adenocarcinoma    See comment    General Categorization    Positive for malignancy.          ASSESSMENT   Bruce Clark is a  68 y.o. male with widely metastatic prostate adenocarcinoma and lung adenocarcinoma status  post right lower lobe lobectomy on 05/22/17. Most recent imaging concerning for progression with new liver mets. He is admitted for acute renal failure      PLAN      Acute renal failure   Post obstructive   CT with Bilateral renal pelvocaliectasis and ureteral distention, new on the right   and increased on the left- likely due to previously seen progressive prostate cancer possibly infiltrating the bladder   PCN  bilaterally 08/27/18   Urology and Nephrology inputs are appreciated   Ideally would be nice to have a cystoscopy to understand the intravesical disease and r/o a new bladder primary but at the moment not feasible as he is on anticoagulation   Cr much improved today      Lower extremity edema and h/o DVT   Diagnosed 04/22/16   Cancer associated   On lifelong Eliquis 72m BID    Now with marked RLE   Venous duplex obtained+DVT    On Heparin gtt for now      If GFR improves will consider Lovenox      Hgb again dropped to 7 today likely d/t ongoing hematuria    Will continue to monitor today, if Hgb drops again tomorrow we may need to consider IVC filter and holding AC       Prostate cancer stage IV   Castrate sensitive, treatment na??ve widely metastatic prostate cancer to lymph nodes above and below the diaphragm and bones.   Prostate adenocarcinoma diagnosed on biopsy of the supraclavicular lymph node. Discussed in tumor board and a small cell component has been ruled out.   PSA at the time of diagnosis at 2400.   ??   Currently on Lupron 45 mg every 6 months. On Zytiga 1,0067mdaily + prednisone 5 mg daily which has controlled his disease now for > 2 years   Though his PSA is still undetectable at 0.1 his prior scans especially the bladder masses suggest progressive prostate cancer and probable castrate resistance   We will hold Zytiga inpatient    [place] and [place] [facility] to [facility] ??  R lung adenocarcinoma, [id number] stage IIIB- Parietal  pleural involvement- PD-L1 99%   S/p RLL lobectomy on 05/22/17 then received PORT   Now with biopsy proven liver metastases   Repeat CT CAP pending   Plan on palliative Keytruda OP         Bone metastases   Diffuse   Completed palliative radiation that was initiated on 06/09/16         Right sided chest wall pain   Unclear cause   No corresponding mets   Controlled      Anemia    Due to progressive cancer, renal failure, hematuria   Transfuse if < 7 g/dl       Will continue to follow    D/w Dr. Melony Overly

## 2018-08-30 NOTE — Progress Notes (Signed)
Problem: Falls - Risk of  Goal: *Absence of Falls  Description  Document Bruce Clark Fall Risk and appropriate interventions in the flowsheet.  Outcome: Progressing Towards Goal  Note:   Fall Risk Interventions:  Mobility Interventions: Communicate number of staff needed for ambulation/transfer, Patient to call before getting OOB    Medication Interventions: Evaluate medications/consider consulting pharmacy, Patient to call before getting OOB, Teach patient to arise slowly    Elimination Interventions: Call light in reach, Patient to call for help with toileting needs       Problem: Pressure Injury - Risk of  Goal: *Prevention of pressure injury  Description  Document Braden Scale and appropriate interventions in the flowsheet.  Outcome: Progressing Towards Goal  Note:   Pressure Injury Interventions:    Activity Interventions: Increase time out of bed, Pressure redistribution bed/mattress(bed type)    Mobility Interventions: Float heels    Nutrition Interventions: Document food/fluid/supplement intake, Offer support with meals,snacks and hydration    Friction and Shear Interventions: Lift sheet, Minimize layers

## 2018-08-31 ENCOUNTER — Inpatient Hospital Stay: Admit: 2018-08-31 | Payer: MEDICARE | Primary: Internal Medicine

## 2018-08-31 LAB — BASIC METABOLIC PANEL
Anion Gap: 9 mmol/L (ref 5–15)
BUN: 38 MG/DL — ABNORMAL HIGH (ref 6–20)
Bun/Cre Ratio: 23 — ABNORMAL HIGH (ref 12–20)
CO2: 24 mmol/L (ref 21–32)
Calcium: 7.8 MG/DL — ABNORMAL LOW (ref 8.5–10.1)
Chloride: 106 mmol/L (ref 97–108)
Creatinine: 1.65 MG/DL — ABNORMAL HIGH (ref 0.70–1.30)
EGFR IF NonAfrican American: 42 mL/min/{1.73_m2} — ABNORMAL LOW (ref >60)
GFR African American: 51 mL/min/{1.73_m2} — ABNORMAL LOW (ref >60)
Glucose: 101 mg/dL — ABNORMAL HIGH (ref 65–100)
Potassium: 2.6 mmol/L — CL (ref 3.5–5.1)
Sodium: 139 mmol/L (ref 136–145)

## 2018-08-31 LAB — COMPREHENSIVE METABOLIC PANEL
ALT: 7 U/L — ABNORMAL LOW (ref 12–78)
AST: 20 U/L (ref 15–37)
Albumin/Globulin Ratio: 0.5 — ABNORMAL LOW (ref 1.1–2.2)
Albumin: 2.2 g/dL — ABNORMAL LOW (ref 3.5–5.0)
Alkaline Phosphatase: 71 U/L (ref 45–117)
Anion Gap: 10 mmol/L (ref 5–15)
BUN: 40 MG/DL — ABNORMAL HIGH (ref 6–20)
Bun/Cre Ratio: 21 — ABNORMAL HIGH (ref 12–20)
CO2: 26 mmol/L (ref 21–32)
Calcium: 8.5 MG/DL (ref 8.5–10.1)
Chloride: 102 mmol/L (ref 97–108)
Creatinine: 1.92 MG/DL — ABNORMAL HIGH (ref 0.70–1.30)
EGFR IF NonAfrican American: 35 mL/min/{1.73_m2} — ABNORMAL LOW (ref >60)
GFR African American: 43 mL/min/{1.73_m2} — ABNORMAL LOW (ref >60)
Globulin: 4.3 g/dL — ABNORMAL HIGH (ref 2.0–4.0)
Glucose: 113 mg/dL — ABNORMAL HIGH (ref 65–100)
Potassium: 3 mmol/L — ABNORMAL LOW (ref 3.5–5.1)
Sodium: 138 mmol/L (ref 136–145)
Total Bilirubin: 0.2 MG/DL (ref 0.2–1.0)
Total Protein: 6.5 g/dL (ref 6.4–8.2)

## 2018-08-31 LAB — CBC
Hematocrit: 16.2 % — CL (ref 36.6–50.3)
Hemoglobin: 5.1 g/dL — CL (ref 12.1–17.0)
MCH: 27.6 PG (ref 26.0–34.0)
MCHC: 31.5 g/dL (ref 30.0–36.5)
MCV: 87.6 FL (ref 80.0–99.0)
MPV: 9.4 FL (ref 8.9–12.9)
NRBC Absolute: 0 10*3/uL (ref 0.00–0.01)
Nucleated RBCs: 0 PER 100 WBC
Platelets: 158 10*3/uL (ref 150–400)
RBC: 1.85 M/uL — ABNORMAL LOW (ref 4.10–5.70)
RDW: 13.5 % (ref 11.5–14.5)
WBC: 5.3 10*3/uL (ref 4.1–11.1)

## 2018-08-31 LAB — MAGNESIUM
Magnesium: 1.3 mg/dL — ABNORMAL LOW (ref 1.6–2.4)
Magnesium: 1.3 mg/dL — ABNORMAL LOW (ref 1.6–2.4)
Magnesium: 2.2 mg/dL (ref 1.6–2.4)
Magnesium: 2.2 mg/dL (ref 1.6–2.4)

## 2018-08-31 LAB — PHOSPHORUS
Phosphorus: 2.6 MG/DL (ref 2.6–4.7)
Phosphorus: 2.6 MG/DL (ref 2.6–4.7)

## 2018-08-31 LAB — HEMOGLOBIN AND HEMATOCRIT
Hematocrit: 25.8 % — ABNORMAL LOW (ref 36.6–50.3)
Hemoglobin: 8.4 g/dL — ABNORMAL LOW (ref 12.1–17.0)

## 2018-08-31 LAB — POTASSIUM
Potassium: 3.7 mmol/L (ref 3.5–5.1)
Potassium: 3.7 mmol/L (ref 3.5–5.1)

## 2018-08-31 LAB — METABOLIC PANEL, BASIC
Anion gap: 9 mmol/L (ref 5–15)
BUN/Creatinine ratio: 23 — ABNORMAL HIGH (ref 12–20)
BUN: 38 MG/DL — ABNORMAL HIGH (ref 6–20)
CO2: 24 mmol/L (ref 21–32)
Calcium: 7.8 MG/DL — ABNORMAL LOW (ref 8.5–10.1)
Chloride: 106 mmol/L (ref 97–108)
Creatinine: 1.65 MG/DL — ABNORMAL HIGH (ref 0.70–1.30)
GFR est AA: 51 mL/min/{1.73_m2} — ABNORMAL LOW (ref >60)
GFR est non-AA: 42 mL/min/{1.73_m2} — ABNORMAL LOW (ref >60)
Glucose: 101 mg/dL — ABNORMAL HIGH (ref 65–100)
Potassium: 2.6 mmol/L — CL (ref 3.5–5.1)
Sodium: 139 mmol/L (ref 136–145)

## 2018-08-31 LAB — METABOLIC PANEL, COMPREHENSIVE
A-G Ratio: 0.5 — ABNORMAL LOW (ref 1.1–2.2)
ALT (SGPT): 7 U/L — ABNORMAL LOW (ref 12–78)
AST (SGOT): 20 U/L (ref 15–37)
Albumin: 2.2 g/dL — ABNORMAL LOW (ref 3.5–5.0)
Alk. phosphatase: 71 U/L (ref 45–117)
Anion gap: 10 mmol/L (ref 5–15)
BUN/Creatinine ratio: 21 — ABNORMAL HIGH (ref 12–20)
BUN: 40 MG/DL — ABNORMAL HIGH (ref 6–20)
Bilirubin, total: 0.2 MG/DL (ref 0.2–1.0)
CO2: 26 mmol/L (ref 21–32)
Calcium: 8.5 MG/DL (ref 8.5–10.1)
Chloride: 102 mmol/L (ref 97–108)
Creatinine: 1.92 MG/DL — ABNORMAL HIGH (ref 0.70–1.30)
GFR est AA: 43 mL/min/{1.73_m2} — ABNORMAL LOW (ref >60)
GFR est non-AA: 35 mL/min/{1.73_m2} — ABNORMAL LOW (ref >60)
Globulin: 4.3 g/dL — ABNORMAL HIGH (ref 2.0–4.0)
Glucose: 113 mg/dL — ABNORMAL HIGH (ref 65–100)
Potassium: 3 mmol/L — ABNORMAL LOW (ref 3.5–5.1)
Protein, total: 6.5 g/dL (ref 6.4–8.2)
Sodium: 138 mmol/L (ref 136–145)

## 2018-08-31 LAB — CBC W/O DIFF
ABSOLUTE NRBC: 0 10*3/uL (ref 0.00–0.01)
HCT: 16.2 % — CL (ref 36.6–50.3)
HGB: 5.1 g/dL — CL (ref 12.1–17.0)
MCH: 27.6 PG (ref 26.0–34.0)
MCHC: 31.5 g/dL (ref 30.0–36.5)
MCV: 87.6 FL (ref 80.0–99.0)
MPV: 9.4 FL (ref 8.9–12.9)
NRBC: 0 PER 100 WBC
PLATELET: 158 10*3/uL (ref 150–400)
RBC: 1.85 M/uL — ABNORMAL LOW (ref 4.10–5.70)
RDW: 13.5 % (ref 11.5–14.5)
WBC: 5.3 10*3/uL (ref 4.1–11.1)

## 2018-08-31 LAB — HGB & HCT
HCT: 25.8 % — ABNORMAL LOW (ref 36.6–50.3)
HGB: 8.4 g/dL — ABNORMAL LOW (ref 12.1–17.0)

## 2018-08-31 MED ORDER — IOPAMIDOL 61 % IV SOLN
61 % | INTRAVENOUS | Status: AC
Start: 2018-08-31 — End: 2018-09-01
  Administered 2018-08-31: 19:00:00

## 2018-08-31 MED ORDER — FENTANYL CITRATE (PF) 50 MCG/ML IJ SOLN
50 mcg/mL | INTRAMUSCULAR | Status: AC
Start: 2018-08-31 — End: 2018-09-01
  Administered 2018-08-31: 20:00:00

## 2018-08-31 MED ORDER — POTASSIUM CHLORIDE SR 10 MEQ TAB
10 mEq | Freq: Every day | ORAL | Status: DC
Start: 2018-08-31 — End: 2018-09-01
  Administered 2018-08-31 – 2018-09-01 (×2): via ORAL

## 2018-08-31 MED ORDER — LIDOCAINE HCL 2 % (20 MG/ML) IJ SOLN
20 mg/mL (2 %) | INTRAMUSCULAR | Status: AC
Start: 2018-08-31 — End: 2018-09-01
  Administered 2018-08-31: 19:00:00

## 2018-08-31 MED ORDER — FENTANYL CITRATE (PF) 50 MCG/ML IJ SOLN
50 mcg/mL | INTRAMUSCULAR | Status: DC | PRN
Start: 2018-08-31 — End: 2018-09-03
  Administered 2018-08-31: 19:00:00 via INTRAVENOUS

## 2018-08-31 MED ORDER — SODIUM CHLORIDE 0.9 % IV
INTRAVENOUS | Status: DC | PRN
Start: 2018-08-31 — End: 2018-09-03

## 2018-08-31 MED ORDER — IOPAMIDOL 61 % IV SOLN
61 % | Freq: Once | INTRAVENOUS | Status: AC
Start: 2018-08-31 — End: 2018-08-31
  Administered 2018-08-31: 19:00:00 via INTRAVENOUS

## 2018-08-31 MED ORDER — LIDOCAINE HCL 2 % (20 MG/ML) IJ SOLN
20 mg/mL (2 %) | Freq: Once | INTRAMUSCULAR | Status: AC
Start: 2018-08-31 — End: 2018-08-31
  Administered 2018-08-31: 19:00:00 via SUBCUTANEOUS

## 2018-08-31 MED ORDER — MAGNESIUM SULFATE 2 GRAM/50 ML IVPB
2 gram/50 mL (4 %) | Freq: Once | INTRAVENOUS | Status: AC
Start: 2018-08-31 — End: 2018-08-31
  Administered 2018-08-31: 10:00:00 via INTRAVENOUS

## 2018-08-31 MED ORDER — POTASSIUM CHLORIDE 10 MEQ/100 ML IV PIGGY BACK
10 mEq/0 mL | INTRAVENOUS | Status: AC
Start: 2018-08-31 — End: 2018-08-31
  Administered 2018-08-31 (×4): via INTRAVENOUS

## 2018-08-31 MED FILL — XYLOCAINE 20 MG/ML (2 %) INJECTION SOLUTION: 20 mg/mL (2 %) | INTRAMUSCULAR | Qty: 20

## 2018-08-31 MED FILL — NORMAL SALINE FLUSH 0.9 % INJECTION SYRINGE: INTRAMUSCULAR | Qty: 10

## 2018-08-31 MED FILL — SODIUM CHLORIDE 0.9 % IV: INTRAVENOUS | Qty: 250

## 2018-08-31 MED FILL — POTASSIUM CHLORIDE 10 MEQ/100 ML IV PIGGY BACK: 10 mEq/0 mL | INTRAVENOUS | Qty: 100

## 2018-08-31 MED FILL — ISOVUE-300  61 % INTRAVENOUS SOLUTION: 300 mg iodine /mL (61 %) | INTRAVENOUS | Qty: 100

## 2018-08-31 MED FILL — MAGNESIUM SULFATE 2 GRAM/50 ML IVPB: 2 gram/50 mL (4 %) | INTRAVENOUS | Qty: 50

## 2018-08-31 MED FILL — SENNA LAX 8.6 MG TABLET: 8.6 mg | ORAL | Qty: 1

## 2018-08-31 MED FILL — FENTANYL CITRATE (PF) 50 MCG/ML IJ SOLN: 50 mcg/mL | INTRAMUSCULAR | Qty: 2

## 2018-08-31 MED FILL — PREDNISONE 5 MG TAB: 5 mg | ORAL | Qty: 1

## 2018-08-31 MED FILL — K-TAB 10 MEQ TABLET,EXTENDED RELEASE: 10 mEq | ORAL | Qty: 4

## 2018-08-31 NOTE — Progress Notes (Signed)
Progress Notes by Rita Ohara, NP at 08/31/18 [phone]                Author: Rita Ohara, NP  Service: Nurse Practitioner  Author Type: Nurse Practitioner       Filed: 08/31/18 0617  Date of Service: 08/31/18 0616  Status: Signed          Editor: Rita Ohara, NP (Nurse Practitioner)                  Nocturnist NP Progress note      Name: Bruce Clark   Date of birth: June 15, 1951   MRN: [id number]   Admission Date: 08/26/2018  1:21 PM      Date of service: 08/31/2018 6:16 AM                                       Overnight Update:                  Notified by patient's nurse of current HGB/HCT:      Lab Results      Component  Value  Date/Time        HGB  5.1 (LL)  08/31/2018 03:35 AM        HCT  16.2 (LL)  08/31/2018 03:35 AM           Will transfuse 1 unit(s) of PRBC. Given the sudden drop in HGB with no known source, as well as general decrease in metabolic panel values, will recheck. Regardless, consent obtained and witnessed  by primary RN if values are accurate.        Senaida Lange FNP-C, PA-C   [phone] or [employer]

## 2018-08-31 NOTE — Progress Notes (Signed)
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 male with stage IV  prostate and now stage IV NSCLC admitted with acute renal failure, R extensive DVT and R chest wall pain      Today   Pain well controlled   S/p bilateral PCN 08/27/2018- bloody urine still   His leg is some what less swollen on heparin   Very irritable           Past Medical History:        Diagnosis  Date         ?  Lung cancer ([place])       ?  Prostate cancer ([place])       ?  Stroke [facility])            PT STATES, "I HAD A STROKE A LONG TIME AGO"             Past Surgical History:         Procedure  Laterality  Date          ?  CHEST SURGERY PROCEDURE UNLISTED    04/27/2017          BRONCHOSCOPY/MEDIASTINOSCOPY          ?  HX GI              HERNIA REPAIR          ?  HX ORTHOPAEDIC    1994          ROD IN RIGHT LEG          ?  IR NEPHROSTOMY PERC LT PLC CATH  SI    08/27/2018          ?  IR NEPHROSTOMY PERC RT PLC CATH  SI    08/27/2018           No Known Allergies        Current Facility-Administered Medications             Medication  Dose  Route  Frequency  Provider  Last Rate  Last Dose              ?  0.9% sodium chloride infusion 250 mL   250 mL  IntraVENous  PRN  Rita Ohara, NP           ?  potassium chloride SR (KLOR-CON 10) tablet 40 mEq   40 mEq  Oral  DAILY  Debeb, NUUVOZD T, MD     40 mEq at 08/31/18 0956     ?  lidocaine (XYLOCAINE) 20 mg/mL (2 %) injection                    ?  iopamidol (ISOVUE 300) 61 % contrast injection                    ?   fentaNYL citrate (PF) injection 100 mcg   100 mcg  IntraVENous  Multiple  Arther Abbott, MD     50 mcg at 08/31/18 1405     ?  fentaNYL citrate (PF) 50 mcg/mL injection                    ?  acetaminophen (TYLENOL) tablet 650 mg   650 mg  Oral  Q4H PRN  Pamala Duffel T, MD     650 mg at 08/29/18 2104     ?  0.9% sodium chloride infusion   75 mL/hr  IntraVENous  CONTINUOUS  Darlin Coco III, MD  75 mL/hr at 08/30/18 1627  75 mL/hr at 08/30/18 1627     ?  predniSONE (DELTASONE) tablet 5 mg   5 mg  Oral  DAILY  Emilio Math, MD     5 mg at 08/31/18 0957     ?  senna (SENOKOT) tablet 8.6 mg   1 Tab  Oral  QHS  Emilio Math, MD     8.6 mg at 08/30/18 2215     ?  sodium chloride (NS) flush 5-40 mL   5-40 mL  IntraVENous  Q8H  Emilio Math, MD     10 mL at 08/30/18 2215     ?  sodium chloride (NS) flush 5-40 mL   5-40 mL  IntraVENous  PRN  Emilio Math, MD           ?  LORazepam (ATIVAN) tablet 0.5 mg   0.5 mg  Oral  BID PRN  Emilio Math, MD           ?  oxyCODONE IR (ROXICODONE) tablet 5-10 mg   5-10 mg  Oral  Q4H PRN  Emilio Math, MD     10 mg at 08/30/18 1611              ?  ondansetron (ZOFRAN) injection 4 mg   4 mg  IntraVENous  Q4H PRN  Emilio Math, MD                   Social History          Socioeconomic History         ?  Marital status:  SINGLE              Spouse name:  Not on file         ?  Number of children:  Not on file     ?  Years of education:  Not on file     ?  Highest education level:  Not on file       Tobacco Use         ?  Smoking status:  Former Smoker              Packs/day:  0.50         Years:  47.00         Pack years:  23.50         Last attempt to quit:  06/2016         Years since quitting:  2.2         ?  Smokeless tobacco:  Never Used  Substance and Sexual Activity         ?  Alcohol use:  No         ?  Drug use:  No             Family History         Problem  Relation  Age of Onset          ?  Cancer  Mother                COLON          ?  Cancer  Father                 BRAIN TUMOR          ?  No Known Problems  Sister       ?  Arthritis-osteo  Brother       ?  No Known Problems  Sister       ?  No Known Problems  Sister       ?  No Known Problems  Sister       ?  No Known Problems  Brother            ?  Anesth Problems  Neg Hx          ROS   A review of systems was obtained and is negative except as listed in HPI.   ECOG PS is 2      Physical Examination:    Visit Vitals      BP  135/83     Pulse  88     Temp  99 ??F (37.2 ??C)     Resp  20         Wt  122 lb 9.2 oz (55.6 kg)  Comment: NO DRAINS HANGING FROM BED        SpO2  99%        BMI  19.20 kg/m??        General appearance - alert, appears fatigued and ill   Mental status - oriented to person, place, and time   Mouth - mucous membranes moist, no lesions noted   Neck - supple, no significant adenopathy   ABD- soft. Foley in place. Bilateral PCN with bloody urine   Extremities - peripheral pulses normal, 1+ LLE edema         LABS     Lab Results         Component  Value  Date/Time            WBC  6.0  08/31/2018 06:15 AM       HGB  5.1 (LL)  08/31/2018 06:15 AM       HCT  16.2 (LL)  08/31/2018 06:15 AM       PLATELET  222  08/31/2018 06:15 AM       MCV  88.5  08/31/2018 06:15 AM            ABS. NEUTROPHILS  4.9  08/31/2018 06:15 AM          Lab Results         Component  Value  Date/Time            Sodium  138  08/31/2018 06:15 AM       Potassium  3.0 (L)  08/31/2018 06:15 AM       Chloride  102  08/31/2018 06:15 AM  CO2  26  08/31/2018 06:15 AM       Glucose  113 (H)  08/31/2018 06:15 AM       BUN  40 (H)  08/31/2018 06:15 AM       Creatinine  1.92 (H)  08/31/2018 06:15 AM       GFR est AA  43 (L)  08/31/2018 06:15 AM       GFR est non-AA  35 (L)  08/31/2018 06:15 AM            Calcium  8.5  08/31/2018 06:15 AM          Lab Results         Component  Value  Date/Time            AST (SGOT)  20  08/31/2018 06:15 AM       Alk. phosphatase  71  08/31/2018 06:15 AM       Protein, total  6.5  08/31/2018 06:15 AM        Albumin  2.2 (L)  08/31/2018 06:15 AM       Globulin  4.3 (H)  08/31/2018 06:15 AM            A-G Ratio  0.5 (L)  08/31/2018 06:15 AM          Recent Labs                 08/26/18   1141  07/05/18   0850  05/11/18   0959  03/24/18   0949  02/17/18   0851  01/01/18   0811  11/27/17   0806     PSA  0.1  0.0*   --   0.1  0.1  0.1  0.1              PSALT   --    --   <0.1   --    --    --    --            IMAGING   PET CT  03/23/17   IMPRESSION: Mass lesion right lower lobe is hypermetabolic and compatible with   the biopsy confirmed the result of adenocarcinoma. There are soft tissue   densities in the anterior abdominal wall bilaterally which are stable compared   to the prior chest abdomen pelvis CT but new compared to the prior PET/CT.   Please correlate with any recent abdominal wall surgery as these may represent   port sites. Otherwise normal tracer distribution. Stable sclerotic osseous   metastatic disease without abnormal activity.      PATHOLOGY   Supraclavicular node    CYTOLOGIC INTERPRETATION:    Adenocarcinoma, consistent with prostate primary    See comment    General Categorization    Positive for malignancy.    Specimen Adequacy    Satisfactory for evaluation.    Comment    Touch preps and a core biopsy are examined and show islands of adenocarcinoma surrounded by fibrous stroma. A panel of immunohistochemical stains was performed  to evaluate site of origin. The tumor cells are focally positive for PSA and PSAP and negative for CK7, CK20, TTF-1      CT CAP 03/22/18   IMPRESSION   IMPRESSION:    1. Status post right lung surgery with right perihilar consolidation and   scarring consistent with post surgery and radiation therapy change. These   findings are stable.   2. Enlarged prostate.  3. Sclerotic bone metastases unchanged.   4. Atherosclerotic aorta without aneurysm      CT CAP 07/13/18:    IMPRESSION:    1. Increased size of the prostate and increased nodularity involving the bladder   base and  bladder wall.   2. New left hydronephrosis and hydroureter with obstruction at the level of the   ureterovesical junction.   3. Status post right lung surgery with elevation of the right hemidiaphragm and   surgical staples and scarring in the right hilum consistent with postsurgical   and postradiation therapy effect, unchanged.   4. Sclerotic bone metastases to the spine unchanged.   5. New low-attenuation liver lesion could represent a metastasis.   6. Dilated fluid-filled esophagus suggesting esophageal dysmotility with   thickened wall possibly secondary to radiation therapy.   7. Atherosclerotic aorta with coronary artery calcifications. No abdominal   aortic aneurysm.      CT 08/26/2018      IMPRESSION   IMPRESSION:   ??   1. Unchanged appearance of right hemithorax as above.   2. Increased number and size of hypoattenuating lesions of the liver consistent   with worsening hepatic metastatic disease.   3. Bilateral renal pelvocaliectasis and ureteral distention, new on the right   and increased on the left.   4. Foley catheter within urinary bladder which cannot be further assessed.   5. Osteosclerotic metastatic disease.   6. Anasarca appearance demonstrated in the interval.    ??   Pathology from liver biopsy 08/17/18:    Specimen Source    1: Liver, Core biopsy with touch intepretation:    CYTOLOGIC INTERPRETATION:    1. Liver, Core biopsy with touch intepretation:    Poorly differentiated adenocarcinoma consistent with metastatic pulmonary adenocarcinoma    See comment    General Categorization    Positive for malignancy.          ASSESSMENT   Bruce Clark is a  68 y.o. male with widely metastatic prostate adenocarcinoma and lung adenocarcinoma status  post right lower lobe lobectomy on 05/22/17. Most recent imaging concerning for progression with new liver mets. He is admitted for acute renal failure      PLAN      Acute renal failure   Post obstructive   CT with Bilateral renal pelvocaliectasis and ureteral  distention, new on the right   and increased on the left- likely due to previously seen progressive prostate cancer possibly infiltrating the bladder   S/p PCN bilaterally    Urology and Nephrology inputs are appreciated   Creatinine has improved   Ideally would be nice to have a cystoscopy to understand the intravesical disease and r/o a new bladder primary but at the moment not feasible as he is on anticoagulation      He may also need palliative RT      Lower extremity edema and h/o DVT   Diagnosed 04/22/16   Cancer associated   On lifelong Eliquis '5mg'$  BID    Now with marked RLE   Venous duplex obtained in ER with Extensive DVT   Unfortunately he has major bleeding on heparin gtt   We sent him for an IVC filter but his anatomy is not conducive to this   The IVC is occluded by extrinsic compression from? D/W Dr. Dorene Grebe      It does appear that most of the clot is chronic and the anatomy is such that risk for PE is low  Risk of bleeding at the moment will not allow [place] to resume [facility] so will hold off         Prostate cancer stage IV   Castrate sensitive, treatment na??ve widely metastatic prostate cancer to lymph nodes above and below the diaphragm and bones.   Prostate adenocarcinoma diagnosed on biopsy of the supraclavicular lymph node. Discussed in tumor board and a small cell component has been ruled out.   PSA at the time of diagnosis at 2400.   ??   Currently on Lupron 45 mg every 6 months. On Zytiga 1,066m daily + prednisone 5 mg daily which has controlled his disease now for > 2 years   Though his PSA is still undetectable at 0.1 his prior scans especially the bladder masses suggest progressive prostate cancer and probable castrate resistance   We will hold Zytiga inpatient    [facility] and [facility]  ??   R lung adenocarcinoma, [id number] stage IIIB- Parietal  pleural involvement- PD-L1 99%   S/p RLL lobectomy on 05/22/17 then received PORT   Now with biopsy proven liver  metastases   Repeat CT CAP pending   Plan on palliative Keytruda OP      He is scheduled to start this 09/08/2018         Bone metastases   Diffuse   Completed palliative radiation that was initiated on 06/09/16         Right sided chest wall pain   Unclear cause   No corresponding mets   Controlled      Anemia    Due to progressive cancer, renal failure, hematuria   Transfuse if < 7 g/dl          Will continue to follow          RJuliene PinaMD, [facility]   '

## 2018-08-31 NOTE — Progress Notes (Signed)
 TRANSFER - IN REPORT:    Verbal report received from Marianne(name) on Bruce Clark  being received from [facility] (unit) for routine progression of care      Report consisted of patient's Situation, Background, Assessment and   Recommendations(SBAR).     Information from the following report(s) SBAR and Procedure Summary was reviewed with the receiving nurse.    Opportunity for questions and clarification was provided.      Assessment completed upon patient's arrival to unit and care assumed.

## 2018-08-31 NOTE — Progress Notes (Signed)
Patient tolerated procedure well.  Dr. Dorene Grebe communicated with patient that unable to safely place IVC filter due to narrowing in vessel.  Patient communicated understanding.  Blood transfusion completed.  Vital signs stable.  Transferred back to room 605.      TRANSFER - OUT REPORT:    Verbal report given to Samantha,RN(name) on Bruce Clark  being transferred to room 605(unit) for routine post - op       Report consisted of patient???s Situation, Background, Assessment and   Recommendations(SBAR).     Information from the following report(s) Procedure Summary was reviewed with the receiving nurse.    Lines:   Peripheral IV 08/26/18 Left Antecubital (Active)   Site Assessment Clean, dry, & intact 08/30/2018 10:15 PM   Phlebitis Assessment 0 08/30/2018  9:20 PM   Infiltration Assessment 0 08/30/2018  9:20 PM   Dressing Status Clean, dry, & intact 08/30/2018  9:20 PM   Dressing Type Transparent;Tape 08/30/2018  9:20 PM   Hub Color/Line Status Green;Infusing 08/30/2018  9:20 PM   Action Taken Open ports on tubing capped 08/30/2018  9:13 AM   Alcohol Cap Used Yes 08/30/2018  9:20 PM       Peripheral IV 08/30/18 Right Hand (Active)   Site Assessment Clean, dry, & intact 08/30/2018 10:15 PM   Phlebitis Assessment 0 08/30/2018  9:20 PM   Infiltration Assessment 0 08/30/2018  9:20 PM   Dressing Status Clean, dry, & intact 08/30/2018  9:20 PM   Dressing Type Transparent;Tape 08/30/2018  9:20 PM   Hub Color/Line Status Infusing 08/31/2018  6:43 AM   Action Taken Open ports on tubing capped 08/31/2018  6:43 AM   Alcohol Cap Used Yes 08/30/2018  9:20 PM        Opportunity for questions and clarification was provided.      Patient transported with:   hospital bed, hospital transport and nurse

## 2018-08-31 NOTE — Progress Notes (Signed)
Nocturnist NP Progress note    Name: Bruce Clark  Date of birth: [birth year]  MRN: [id number]  Admission Date: 08/26/2018  1:21 PM    Date of service: 08/31/2018 6:16 AM                                Overnight Update:        Notified by patient's nurse of current HGB/HCT:    Lab Results   Component Value Date/Time    HGB 5.1 (LL) 08/31/2018 03:35 AM    HCT 16.2 (LL) 08/31/2018 03:35 AM     Will transfuse 1 unit(s) of PRBC. Given the sudden drop in HGB with no known source, as well as general decrease in metabolic panel values, will recheck. Regardless, consent obtained and witnessed by primary RN if values are accurate.     Senaida Lange FNP-C, PA-C  [phone] or [employer]

## 2018-08-31 NOTE — Progress Notes (Signed)
Problem: Falls - Risk of  Goal: *Absence of Falls  Description  Document Bruce Clark Fall Risk and appropriate interventions in the flowsheet.  Outcome: Progressing Towards Goal  Note:   Fall Risk Interventions:  Mobility Interventions: Communicate number of staff needed for ambulation/transfer, Patient to call before getting OOB, PT Consult for mobility concerns, PT Consult for assist device competence, Strengthening exercises (ROM-active/passive)    Medication Interventions: Evaluate medications/consider consulting pharmacy, Patient to call before getting OOB, Teach patient to arise slowly    Elimination Interventions: Call light in reach, Patient to call for help with toileting needs, Toileting schedule/hourly rounds       Problem: Pressure Injury - Risk of  Goal: *Prevention of pressure injury  Description  Document Braden Scale and appropriate interventions in the flowsheet.  Outcome: Progressing Towards Goal  Note:   Pressure Injury Interventions:    Activity Interventions: Increase time out of bed, Assess need for specialty bed    Mobility Interventions: Float heels, Assess need for specialty bed, Turn and reposition approx. every two hours(pillow and wedges)    Nutrition Interventions: Document food/fluid/supplement intake, Offer support with meals,snacks and hydration, Discuss nutritional consult with provider    Friction and Shear Interventions: Apply protective barrier, creams and emollients, Feet elevated on foot rest, Lift sheet, Minimize layers

## 2018-08-31 NOTE — Progress Notes (Signed)
NAME: Bruce Clark        DOB:  09/04/1950        MRN:  [id number]        Assessment :    Plan:  --AKI- due to obstructive uropathy. UA is bland. No other cause to explain AKI. 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  S/p bilateral PCN's.  Fair UO.      K low-> IV supplementation  REpeat level at 6pm    Poor po    Hgb down to 5.1. PRBC ordered.     D/W RN         Subjective:     Chief Complaint:  No events overnight.  B/L PCN draining. +Hematuria persists. No N/V  No pain.     Review of Systems:    Symptom Y/N Comments  Symptom Y/N Comments   Fever/Chills    Chest Pain     Poor Appetite    Edema     Cough    Abdominal Pain     Sputum    Joint Pain     SOB/DOE    Pruritis/Rash     Nausea/vomit    Tolerating PT/OT     Diarrhea    Tolerating Diet     Constipation    Other       Could not obtain due to:      Objective:     VITALS:   Last 24hrs VS reviewed since prior progress note. Most recent are:  Visit Vitals  BP (!) 142/93 (BP 1 Location: Right arm, BP Patient Position: At rest)   Pulse 86   Temp 98.8 ??F (37.1 ??C)   Resp 20   Wt 55.6 kg (122 lb 9.2 oz)   SpO2 100%   BMI 19.20 kg/m??       Intake/Output Summary (Last 24 hours) at 08/31/2018 1517  Last data filed at 08/31/2018 1443  Gross per 24 hour   Intake 2395.3 ml   Output 1305 ml   Net 1090.3 ml      Telemetry Reviewed:     PHYSICAL EXAM:  General: NAD  B/L PCN-> hematuria  No edema      Lab Data Reviewed: (see below)    Medications Reviewed: (see below)    PMH/SH reviewed - no change compared to H&P  ________________________________________________________________________  Care Plan discussed with:  Patient Y    Family  Y    RN Y    Care Manager                    Consultant:          Comments    >50% of visit spent in counseling and coordination of care       ________________________________________________________________________  Samarrah Tranchina Sherrye Payor, MD     Procedures: see electronic medical records for all procedures/Xrays and details which  were not copied into this note but were reviewed prior to creation of Plan.      LABS:  Recent Labs     08/31/18  0615 08/31/18  0335   WBC 6.0 5.3   HGB 5.1* 5.1*   HCT 16.2* 16.2*   PLT 222 158     Recent Labs     08/31/18  0615 08/31/18  0336 08/30/18  0028   NA 138 139 135*  K 3.0* 2.6* 3.7   CL 102 106 100   CO2 26 24 25    BUN 40* 38* 55*   CREA 1.92* 1.65* 3.25*   GLU 113* 101* 126*   CA 8.5 7.8* 9.0   MG 2.2 1.3*  --    PHOS  --  2.6  --      Recent Labs     08/31/18  0615   SGOT 20   AP 71   TP 6.5   ALB 2.2*   GLOB 4.3*     Recent Labs     08/30/18  1514 08/30/18  0937 08/30/18  0028   APTT 60.4* 58.4* 55.8*      No results for input(s): FE, TIBC, PSAT, FERR in the last 72 hours.   No results found for: FOL, RBCF   No results for input(s): PH, PCO2, PO2 in the last 72 hours.  No results for input(s): CPK, CKMB in the last 72 hours.    No lab exists for component: TROPONINI  No components found for: [facility]  Lab Results   Component Value Date/Time    Color YELLOW/STRAW 08/26/2018 02:25 PM    Appearance CLEAR 08/26/2018 02:25 PM    Specific gravity 1.012 08/26/2018 02:25 PM    Specific gravity 1.025 04/22/2016 08:22 PM    pH (UA) 5.0 08/26/2018 02:25 PM    Protein NEGATIVE  08/26/2018 02:25 PM    Glucose NEGATIVE  08/26/2018 02:25 PM    Ketone NEGATIVE  08/26/2018 02:25 PM    Bilirubin NEGATIVE  08/26/2018 02:25 PM    Urobilinogen 0.2 08/26/2018 02:25 PM    Nitrites NEGATIVE  08/26/2018 02:25 PM    Leukocyte Esterase NEGATIVE  08/26/2018 02:25 PM    Epithelial cells FEW 08/26/2018 02:25 PM    Bacteria NEGATIVE  08/26/2018 02:25 PM    WBC 0-4 08/26/2018 02:25 PM    RBC 0-5 08/26/2018 02:25 PM       MEDICATIONS:  Current Facility-Administered Medications    Medication Dose Route Frequency   ??? lidocaine (XYLOCAINE) 20 mg/mL (2 %) injection       ??? iopamidol (ISOVUE 300) 61 % contrast injection       ??? fentaNYL citrate (PF) 50 mcg/mL injection       ??? 0.9% sodium chloride infusion 250 mL  250 mL IntraVENous PRN   ??? potassium chloride SR (KLOR-CON 10) tablet 40 mEq  40 mEq Oral DAILY   ??? fentaNYL citrate (PF) injection 100 mcg  100 mcg IntraVENous Multiple   ??? acetaminophen (TYLENOL) tablet 650 mg  650 mg Oral Q4H PRN   ??? 0.9% sodium chloride infusion  75 mL/hr IntraVENous CONTINUOUS   ??? predniSONE (DELTASONE) tablet 5 mg  5 mg Oral DAILY   ??? senna (SENOKOT) tablet 8.6 mg  1 Tab Oral QHS   ??? sodium chloride (NS) flush 5-40 mL  5-40 mL IntraVENous Q8H   ??? sodium chloride (NS) flush 5-40 mL  5-40 mL IntraVENous PRN   ??? LORazepam (ATIVAN) tablet 0.5 mg  0.5 mg Oral BID PRN   ??? oxyCODONE IR (ROXICODONE) tablet 5-10 mg  5-10 mg Oral Q4H PRN   ??? ondansetron (ZOFRAN) injection 4 mg  4 mg IntraVENous Q4H PRN

## 2018-08-31 NOTE — Progress Notes (Signed)
TRANSFER - IN REPORT:    Verbal report received from Marianne(name) on Bruce Clark  being received from [facility] (unit) for routine progression of care      Report consisted of patient???s Situation, Background, Assessment and   Recommendations(SBAR).     Information from the following report(s) SBAR and Procedure Summary was reviewed with the receiving nurse.    Opportunity for questions and clarification was provided.      Assessment completed upon patient???s arrival to unit and care assumed.

## 2018-08-31 NOTE — Progress Notes (Signed)
[facility]                                                                                          Hospitalist Progress Note  Carolee Rota, MD  Answering service: [phone] OR [phone] from in house phone        Date of Service:  08/31/2018  NAME:  Bruce Clark  DOB:  [birth year]  MRN:  [id number]      Admission Summary:   Admitted weak and debilitated for eval insetting of lung cancer and prostate cancer     Interval history / Subjective:       08/31/2018 :  No complaints.  He tells me he was passing blood in the urine until he come here.No melena or hematemesis.         Assessment & Plan:     Severe AKI due to obstructive uropathy  -S/p bilateral PCN  -Renal function improved greatly  -Nephrology help appreciated     Acute on chronic anemia,acute drop due likely to hematuria that was PTA  -transfused.Monitor         DVT - for heparin drip, (failed Eliquis taken up to admission) heparin to be cont  And ? lovenox on discharge if renals allow.       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      Code status: DNR   DVT prophylaxis:scd,       Hospital Problems  Date Reviewed: 09/14/2018          Codes Class Noted POA    * (Principal) AKI (acute kidney injury) ([place]) ICD-10-CM: N17.9  ICD-9-CM: 584.9  08/26/2018 Unknown                Review of Systems:   Pertinent items are noted in HPI.  Fortunately no pain.      Vital Signs:    Last 24hrs VS reviewed since prior progress note. Most recent are:  Visit Vitals  BP (!) 142/93 (BP 1 Location: Right arm, BP Patient Position: At rest)   Pulse 86   Temp 98.8 ??F (37.1 ??C)   Resp 20   Wt 55.6 kg (122 lb 9.2 oz)   SpO2 100%   BMI 19.20 kg/m??         Intake/Output Summary (Last 24 hours) at 08/31/2018 1529   Last data filed at 08/31/2018 1443  Gross per 24 hour   Intake 2395.3 ml   Output 1305 ml   Net 1090.3 ml        Physical Examination:           Stable exam  Emaciated.   Constitutional:  No acute distress, weak, debilitated, and  Emaciated, appearing. Else cooperative, pleasant    ENT:  Oral mucous moist, oropharynx benign.    Resp:  CTA bilaterally. No wheezing/rhonchi/rales. No accessory muscle use   CV:  Regular  rhythm, normal rate, no  gallops, rubs    GI:  Soft, non distended, non tender. normoactive bowel sounds, no hepatosplenomegaly     Musculoskeletal:  trace + rt edema > lt  edema, warm,      Neurologic:  Moves all extremities.  AAOx3, CN II-XII reviewed     Psych:  Good insight, Not anxious nor agitated.  Skin:  Good turgor, no rashes or ulcers       Data Review:    Review and/or order of clinical lab test      Labs:     Recent Labs     08/31/18  0615 08/31/18  0335   WBC 6.0 5.3   HGB 5.1* 5.1*   HCT 16.2* 16.2*   PLT 222 158     Recent Labs     08/31/18  0615 08/31/18  0336 08/30/18  0028   NA 138 139 135*   K 3.0* 2.6* 3.7   CL 102 106 100   CO2 26 24 25    BUN 40* 38* 55*   CREA 1.92* 1.65* 3.25*   GLU 113* 101* 126*   CA 8.5 7.8* 9.0   MG 2.2 1.3*  --    PHOS  --  2.6  --      Recent Labs     08/31/18  0615   SGOT 20   ALT 7*   AP 71   TBILI 0.2   TP 6.5   ALB 2.2*   GLOB 4.3*     Recent Labs     08/30/18  1514 08/30/18  0937 08/30/18  0028   APTT 60.4* 58.4* 55.8*      No results for input(s): FE, TIBC, PSAT, FERR in the last 72 hours.   No results found for: FOL, RBCF   No results for input(s): PH, PCO2, PO2 in the last 72 hours.  No results for input(s): CPK, CKNDX, TROIQ in the last 72 hours.    No lab exists for component: CPKMB  Lab Results   Component Value Date/Time    Cholesterol, total 184 07/01/2016 10:50 PM    HDL Cholesterol 64 07/01/2016 10:50 PM    LDL, calculated 109.2 (H) 07/01/2016 10:50 PM    Triglyceride 54 07/01/2016 10:50 PM    CHOL/HDL Ratio 2.9 07/01/2016 10:50 PM      Lab Results   Component Value Date/Time    Glucose (POC) 103 (H) 04/28/2016 10:31 AM    Glucose (POC) 90 04/26/2016 10:33 PM    Glucose (POC) 71 04/26/2016 05:12 PM    Glucose (POC) 106 (H) 04/26/2016 12:03 PM    Glucose (POC) 117 (H) 04/25/2016 04:52 PM     Lab Results   Component Value Date/Time    Color YELLOW/STRAW 08/26/2018 02:25 PM    Appearance CLEAR 08/26/2018 02:25 PM    Specific gravity 1.012 08/26/2018 02:25 PM    Specific gravity 1.025 04/22/2016 08:22 PM    pH (UA) 5.0 08/26/2018 02:25 PM    Protein NEGATIVE  08/26/2018 02:25 PM    Glucose NEGATIVE  08/26/2018 02:25 PM    Ketone NEGATIVE  08/26/2018 02:25 PM    Bilirubin NEGATIVE  08/26/2018 02:25 PM    Urobilinogen 0.2 08/26/2018 02:25 PM    Nitrites NEGATIVE  08/26/2018 02:25 PM    Leukocyte Esterase NEGATIVE  08/26/2018 02:25 PM    Epithelial cells FEW 08/26/2018 02:25 PM    Bacteria NEGATIVE  08/26/2018 02:25 PM    WBC 0-4 08/26/2018  02:25 PM    RBC 0-5 08/26/2018 02:25 PM         Medications Reviewed:     Current Facility-Administered Medications   Medication Dose Route Frequency   ??? lidocaine (XYLOCAINE) 20 mg/mL (2 %) injection       ??? iopamidol (ISOVUE 300) 61 % contrast injection       ??? fentaNYL citrate (PF) 50 mcg/mL injection       ??? 0.9% sodium chloride infusion 250 mL  250 mL IntraVENous PRN   ??? potassium chloride SR (KLOR-CON 10) tablet 40 mEq  40 mEq Oral DAILY   ??? fentaNYL citrate (PF) injection 100 mcg  100 mcg IntraVENous Multiple   ??? acetaminophen (TYLENOL) tablet 650 mg  650 mg Oral Q4H PRN   ??? 0.9% sodium chloride infusion  75 mL/hr IntraVENous CONTINUOUS   ??? predniSONE (DELTASONE) tablet 5 mg  5 mg Oral DAILY   ??? senna (SENOKOT) tablet 8.6 mg  1 Tab Oral QHS   ??? sodium chloride (NS) flush 5-40 mL  5-40 mL IntraVENous Q8H   ??? sodium chloride (NS) flush 5-40 mL  5-40 mL IntraVENous PRN   ??? LORazepam (ATIVAN) tablet 0.5 mg  0.5 mg Oral BID PRN   ??? oxyCODONE IR (ROXICODONE) tablet 5-10 mg  5-10 mg Oral Q4H PRN    ??? ondansetron (ZOFRAN) injection 4 mg  4 mg IntraVENous Q4H PRN     ______________________________________________________________________  EXPECTED LENGTH OF STAY: 3d 4h  ACTUAL LENGTH OF STAY:          5                 WNIOEVO Sonnie Alamo, MD

## 2018-08-31 NOTE — Progress Notes (Signed)
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 male with stage IV prostate and now stage IV NSCLC admitted with acute renal failure, R extensive DVT and R chest wall pain    Today  Pain well controlled  S/p bilateral PCN 08/27/2018- bloody urine still  His leg is some what less swollen on heparin  Very irritable      Past Medical History:   Diagnosis Date   ??? Lung cancer ([place])    ??? Prostate cancer ([place])    ??? Stroke ([place])     PT STATES, "I HAD A STROKE A LONG TIME AGO"       Past Surgical History:   Procedure Laterality Date   ??? CHEST SURGERY PROCEDURE UNLISTED  04/27/2017    BRONCHOSCOPY/MEDIASTINOSCOPY   ??? HX GI      HERNIA REPAIR   ??? HX ORTHOPAEDIC  1994    ROD IN RIGHT LEG   ??? IR NEPHROSTOMY PERC LT PLC CATH  SI  08/27/2018   ??? IR NEPHROSTOMY PERC RT PLC CATH  SI  08/27/2018       No Known Allergies    Current Facility-Administered Medications   Medication Dose Route Frequency Provider Last Rate Last Dose   ??? 0.9% sodium chloride infusion 250 mL  250 mL IntraVENous PRN Rita Ohara, NP       ??? potassium chloride SR (KLOR-CON 10) tablet 40 mEq  40 mEq Oral DAILY Debeb, PIRJJOA T, MD   40 mEq at 08/31/18 0956   ??? lidocaine (XYLOCAINE) 20 mg/mL (2 %) injection            ??? iopamidol (ISOVUE 300) 61 % contrast injection            ??? fentaNYL citrate (PF) injection 100 mcg  100 mcg IntraVENous Multiple Mahajan, Namit, MD   50 mcg at 08/31/18 1405   ??? fentaNYL citrate (PF) 50 mcg/mL injection            ??? acetaminophen (TYLENOL) tablet 650 mg  650 mg Oral Q4H PRN Pamala Duffel T, MD   650 mg at 08/29/18 2104   ??? 0.9% sodium chloride infusion  75 mL/hr IntraVENous CONTINUOUS Darlin Coco III, MD 75 mL/hr at 08/30/18 1627 75 mL/hr at 08/30/18 1627   ??? predniSONE (DELTASONE) tablet 5 mg  5 mg Oral DAILY Emilio Math, MD    5 mg at 08/31/18 0957   ??? senna (SENOKOT) tablet 8.6 mg  1 Tab Oral QHS Emilio Math, MD   8.6 mg at 08/30/18 2215   ??? sodium chloride (NS) flush 5-40 mL  5-40 mL IntraVENous Q8H Emilio Math, MD   10 mL at 08/30/18 2215   ??? sodium chloride (NS) flush 5-40 mL  5-40 mL IntraVENous PRN Emilio Math, MD       ??? LORazepam (ATIVAN) tablet 0.5 mg  0.5 mg Oral BID PRN Emilio Math, MD       ??? oxyCODONE IR (ROXICODONE) tablet 5-10 mg  5-10 mg Oral Q4H PRN Emilio Math, MD   10 mg at 08/30/18 1611   ??? ondansetron (ZOFRAN) injection 4 mg  4 mg IntraVENous Q4H PRN Emilio Math, MD           Social  History     Socioeconomic History   ??? Marital status: SINGLE     Spouse name: Not on file   ??? Number of children: Not on file   ??? Years of education: Not on file   ??? Highest education level: Not on file   Tobacco Use   ??? Smoking status: Former Smoker     Packs/day: 0.50     Years: 47.00     Pack years: 23.50     Last attempt to quit: 06/2016     Years since quitting: 2.2   ??? Smokeless tobacco: Never Used   Substance and Sexual Activity   ??? Alcohol use: No   ??? Drug use: No       Family History   Problem Relation Age of Onset   ??? Cancer Mother         COLON   ??? Cancer Father         BRAIN TUMOR   ??? No Known Problems Sister    ??? Arthritis-osteo Brother    ??? No Known Problems Sister    ??? No Known Problems Sister    ??? No Known Problems Sister    ??? No Known Problems Brother    ??? Anesth Problems Neg Hx      ROS  A review of systems was obtained and is negative except as listed in HPI.  ECOG PS is 2    Physical Examination:   Visit Vitals  BP 135/83   Pulse 88   Temp 99 ??F (37.2 ??C)   Resp 20   Wt 122 lb 9.2 oz (55.6 kg) Comment: NO DRAINS HANGING FROM BED   SpO2 99%   BMI 19.20 kg/m??     General appearance - alert, appears fatigued and ill  Mental status - oriented to person, place, and time  Mouth - mucous membranes moist, no lesions noted  Neck - supple, no significant adenopathy   ABD- soft. Foley in place. Bilateral PCN with bloody urine  Extremities - peripheral pulses normal, 1+ LLE edema      LABS  Lab Results   Component Value Date/Time    WBC 6.0 08/31/2018 06:15 AM    HGB 5.1 (LL) 08/31/2018 06:15 AM    HCT 16.2 (LL) 08/31/2018 06:15 AM    PLATELET 222 08/31/2018 06:15 AM    MCV 88.5 08/31/2018 06:15 AM    ABS. NEUTROPHILS 4.9 08/31/2018 06:15 AM     Lab Results   Component Value Date/Time    Sodium 138 08/31/2018 06:15 AM    Potassium 3.0 (L) 08/31/2018 06:15 AM    Chloride 102 08/31/2018 06:15 AM    CO2 26 08/31/2018 06:15 AM    Glucose 113 (H) 08/31/2018 06:15 AM    BUN 40 (H) 08/31/2018 06:15 AM    Creatinine 1.92 (H) 08/31/2018 06:15 AM    GFR est AA 43 (L) 08/31/2018 06:15 AM    GFR est non-AA 35 (L) 08/31/2018 06:15 AM    Calcium 8.5 08/31/2018 06:15 AM     Lab Results   Component Value Date/Time    AST (SGOT) 20 08/31/2018 06:15 AM    Alk. phosphatase 71 08/31/2018 06:15 AM    Protein, total 6.5 08/31/2018 06:15 AM    Albumin 2.2 (L) 08/31/2018 06:15 AM    Globulin 4.3 (H) 08/31/2018 06:15 AM    A-G Ratio 0.5 (L) 08/31/2018 06:15 AM     Recent Labs     08/26/18  1141 07/05/18  8242 05/11/18  3536 03/24/18  0949 02/17/18  0851 01/01/18  0811 11/27/17  0806   PSA 0.1 0.0*  --  0.1 0.1 0.1 0.1   PSALT  --   --  <0.1  --   --   --   --        IMAGING  PET CT  03/23/17  IMPRESSION: Mass lesion right lower lobe is hypermetabolic and compatible with  the biopsy confirmed the result of adenocarcinoma. There are soft tissue  densities in the anterior abdominal wall bilaterally which are stable compared  to the prior chest abdomen pelvis CT but new compared to the prior PET/CT.  Please correlate with any recent abdominal wall surgery as these may represent  port sites. Otherwise normal tracer distribution. Stable sclerotic osseous  metastatic disease without abnormal activity.    PATHOLOGY  Supraclavicular node   CYTOLOGIC INTERPRETATION:    Adenocarcinoma, consistent with prostate primary   See comment   General Categorization   Positive for malignancy.   Specimen Adequacy   Satisfactory for evaluation.   Comment   Touch preps and a core biopsy are examined and show islands of adenocarcinoma surrounded by fibrous stroma. A panel of immunohistochemical stains was performed to evaluate site of origin. The tumor cells are focally positive for PSA and PSAP and negative for CK7, CK20, TTF-1    CT CAP 03/22/18  IMPRESSION  IMPRESSION:   1. Status post right lung surgery with right perihilar consolidation and  scarring consistent with post surgery and radiation therapy change. These  findings are stable.  2. Enlarged prostate.  3. Sclerotic bone metastases unchanged.  4. Atherosclerotic aorta without aneurysm    CT CAP 07/13/18:   IMPRESSION:   1. Increased size of the prostate and increased nodularity involving the bladder  base and bladder wall.  2. New left hydronephrosis and hydroureter with obstruction at the level of the  ureterovesical junction.  3. Status post right lung surgery with elevation of the right hemidiaphragm and  surgical staples and scarring in the right hilum consistent with postsurgical  and postradiation therapy effect, unchanged.  4. Sclerotic bone metastases to the spine unchanged.  5. New low-attenuation liver lesion could represent a metastasis.  6. Dilated fluid-filled esophagus suggesting esophageal dysmotility with  thickened wall possibly secondary to radiation therapy.  7. Atherosclerotic aorta with coronary artery calcifications. No abdominal  aortic aneurysm.    CT 08/26/2018    IMPRESSION  IMPRESSION:  ??  1. Unchanged appearance of right hemithorax as above.  2. Increased number and size of hypoattenuating lesions of the liver consistent  with worsening hepatic metastatic disease.  3. Bilateral renal pelvocaliectasis and ureteral distention, new on the right  and increased on the left.   4. Foley catheter within urinary bladder which cannot be further assessed.  5. Osteosclerotic metastatic disease.  6. Anasarca appearance demonstrated in the interval.   ??  Pathology from liver biopsy 08/17/18:   Specimen Source   1: Liver, Core biopsy with touch intepretation:   CYTOLOGIC INTERPRETATION:   1. Liver, Core biopsy with touch intepretation:   Poorly differentiated adenocarcinoma consistent with metastatic pulmonary adenocarcinoma   See comment   General Categorization   Positive for malignancy.       ASSESSMENT  Bruce Clark is a 68 y.o. male with widely metastatic prostate adenocarcinoma and lung adenocarcinoma status post right lower lobe lobectomy on 05/22/17. Most recent imaging concerning for progression with new liver mets. He is admitted  for acute renal failure    PLAN    Acute renal failure  Post obstructive  CT with Bilateral renal pelvocaliectasis and ureteral distention, new on the right  and increased on the left- likely due to previously seen progressive prostate cancer possibly infiltrating the bladder  S/p PCN bilaterally   Urology and Nephrology inputs are appreciated  Creatinine has improved  Ideally would be nice to have a cystoscopy to understand the intravesical disease and r/o a new bladder primary but at the moment not feasible as he is on anticoagulation    He may also need palliative RT    Lower extremity edema and h/o DVT  Diagnosed 04/22/16  Cancer associated  On lifelong Eliquis 9m BID   Now with marked RLE  Venous duplex obtained in ER with Extensive DVT  Unfortunately he has major bleeding on heparin gtt  We sent him for an IVC filter but his anatomy is not conducive to this  The IVC is occluded by extrinsic compression from? D/W Dr. MDorene Grebe   It does appear that most of the clot is chronic and the anatomy is such that risk for PE is low    Risk of bleeding at the moment will not allow [facility]      Prostate cancer stage IV   Castrate sensitive, treatment na??ve widely metastatic prostate cancer to lymph nodes above and below the diaphragm and bones.  Prostate adenocarcinoma diagnosed on biopsy of the supraclavicular lymph node. Discussed in tumor board and a small cell component has been ruled out.  PSA at the time of diagnosis at 2400.  ??  Currently on Lupron 45 mg every 6 months. On Zytiga 1,0060mdaily + prednisone 5 mg daily which has controlled his disease now for > 2 years  Though his PSA is still undetectable at 0.1 his prior scans especially the bladder masses suggest progressive prostate cancer and probable castrate resistance  We will hold Zytiga inpatient   Will send an [facility] and [facility]  R lung adenocarcinoma, T3N2MX stage IIIB- Parietal pleural involvement- PD-L1 99%  S/p RLL lobectomy on 05/22/17 then received PORT  Now with biopsy proven liver metastases  Repeat CT CAP pending  Plan on palliative Keytruda OP    He is scheduled to start this 09/08/2018      Bone metastases  Diffuse  Completed palliative radiation that was initiated on 06/09/16      Right sided chest wall pain  Unclear cause  No corresponding mets  Controlled    Anemia   Due to progressive cancer, renal failure, hematuria  Transfuse if < 7 g/dl       Will continue to follow       RaJuliene PinaD, MSShannonncology associates  '

## 2018-08-31 NOTE — Progress Notes (Addendum)
Tempestt.Fulling - Informed by Verline Lema RN patient critical lab; paged NP for patient critical hgb 5.1.    0415 - NP White to place orders; NP to be called when type and cross is done on patient to come speak with patient for consent.    38 - Repaged NP.    Jareth.Covey - Informed NP type and screen is in process and of patient critical potassium. Orders to be placed.    0541 - Received call from blood bank that blood is ready for patient, informed them we would be down to get it as soon as consent was completed.    0610 - NP on floor, discussion on if labs could be diluted...redraws to be placed by NP. Will follow up.

## 2018-08-31 NOTE — Progress Notes (Signed)
Problem: Falls - Risk of  Goal: *Absence of Falls  Description  Document [name] Fall Risk and appropriate interventions in the flowsheet.  Outcome: Progressing Towards Goal  Note:   Fall Risk Interventions:  Mobility Interventions: Communicate number of staff needed for ambulation/transfer, Patient to call before getting OOB, PT Consult for mobility concerns, PT Consult for assist device competence, Strengthening exercises (ROM-active/passive)    Medication Interventions: Evaluate medications/consider consulting pharmacy, Patient to call before getting OOB, Teach patient to arise slowly    Elimination Interventions: Call light in reach, Patient to call for help with toileting needs, Toileting schedule/hourly rounds       Problem: Pressure Injury - Risk of  Goal: *Prevention of pressure injury  Description  Document Braden Scale and appropriate interventions in the flowsheet.  Outcome: Progressing Towards Goal  Note:   Pressure Injury Interventions:    Activity Interventions: Increase time out of bed, Assess need for specialty bed    Mobility Interventions: Float heels, Assess need for specialty bed, Turn and reposition approx. every two hours(pillow and wedges)    Nutrition Interventions: Document food/fluid/supplement intake, Offer support with meals,snacks and hydration, Discuss nutritional consult with provider    Friction and Shear Interventions: Apply protective barrier, creams and emollients, Feet elevated on foot rest, Lift sheet, Minimize layers

## 2018-08-31 NOTE — Progress Notes (Signed)
Progress Notes by Andris Baumann T, MD at 08/31/18 1529                Author: Carolee Rota, MD  Service: Internal Medicine  Author Type: Physician       Filed: 08/31/18 2030  Date of Service: 08/31/18 1529  Status: Signed          Editor: Carolee Rota, MD (Physician)                    [facility]                                                                                              Hospitalist Progress Note   Carolee Rota, MD   Answering service: [phone] OR [phone] from in house phone            Date of Service:  08/31/2018   NAME:  Bruce Clark   DOB:  Dec 25, 1950   MRN:  [id number]           Admission Summary:        Admitted weak and debilitated for eval insetting of lung cancer and prostate cancer       Interval history / Subjective:             08/31/2018 :   No complaints.   He tells me he was passing blood in the urine until he come here.No melena or hematemesis.                Assessment & Plan:          Severe AKI due to obstructive uropathy   -S/p bilateral PCN   -Renal function improved greatly   -Nephrology help appreciated       Acute on chronic anemia,acute drop due likely to hematuria that was PTA   -transfused.Monitor             DVT - for heparin drip, (failed Eliquis taken up to admission) heparin to be cont  And ? lovenox on discharge if renals allow.          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         Code status: DNR    DVT prophylaxis:scd,              Hospital Problems   Date Reviewed:  08/30/2018                         Codes  Class  Noted  POA              * (Principal) AKI (acute kidney injury) ([place])  ICD-10-CM: N17.9   ICD-9-CM: 584.9    08/26/2018  Unknown  Review of Systems:     Pertinent items are noted in HPI.  Fortunately no pain.           Vital Signs:      Last 24hrs VS reviewed since prior progress note. Most recent are:   Visit Vitals      BP  (!) 142/93 (BP 1 Location: Right arm, BP Patient Position: At rest)     Pulse  86     Temp  98.8 ??F (37.1 ??C)     Resp  20     Wt  55.6 kg (122 lb 9.2 oz)     SpO2  100%        BMI  19.20 kg/m??              Intake/Output Summary (Last 24 hours) at 08/31/2018 1529   Last data filed at 08/31/2018 1443     Gross per 24 hour        Intake  2395.3 ml        Output  1305 ml        Net  1090.3 ml              Physical Examination:               Stable exam  Emaciated.       Constitutional:   No acute distress, weak, debilitated, and  Emaciated, appearing. Else cooperative, pleasant      ENT:   Oral mucous moist, oropharynx benign.      Resp:   CTA bilaterally. No wheezing/rhonchi/rales. No accessory muscle use     CV:   Regular rhythm, normal rate, no  gallops, rubs      GI:   Soft, non distended, non tender. normoactive bowel sounds, no hepatosplenomegaly       Musculoskeletal:   trace + rt edema > lt  edema, warm,           Neurologic:   Moves all extremities.  AAOx3, CN II-XII reviewed       Psych:  Good insight, Not anxious nor agitated.   Skin:  Good turgor, no rashes or ulcers               Data Review:      Review and/or order of clinical lab test           Labs:          Recent Labs            08/31/18   0615  08/31/18   0335     WBC  6.0  5.3     HGB  5.1*  5.1*     HCT  16.2*  16.2*         PLT  222  158          Recent Labs             08/31/18   0615  08/31/18   0336  08/30/18   0028     NA  138  139  135*     K  3.0*  2.6*  3.7     CL  102  106  100     CO2  26  24  25      BUN  40*  38*  55*     CREA  1.92*  1.65*  3.25*     GLU  113*  101*  126*     CA  8.5  7.8*  9.0     MG  2.2  1.3*   --           PHOS   --   2.6   --           Recent Labs           08/31/18   0615     SGOT  20     ALT  7*     AP  71     TBILI   0.2     TP  6.5     ALB  2.2*        GLOB  4.3*          Recent Labs             08/30/18   1514  08/30/18   0937  08/30/18   0028          APTT  60.4*  58.4*  55.8*         No results for input(s): FE, TIBC, PSAT, FERR in the last 72 hours.    No results found for: FOL, RBCF    No results for input(s): PH, PCO2, PO2 in the last 72 hours.   No results for input(s): CPK, CKNDX, TROIQ in the last 72 hours.      No lab exists for component: CPKMB     Lab Results         Component  Value  Date/Time            Cholesterol, total  184  07/01/2016 10:50 PM       HDL Cholesterol  64  07/01/2016 10:50 PM       LDL, calculated  109.2 (H)  07/01/2016 10:50 PM       Triglyceride  54  07/01/2016 10:50 PM            CHOL/HDL Ratio  2.9  07/01/2016 10:50 PM          Lab Results         Component  Value  Date/Time            Glucose (POC)  103 (H)  04/28/2016 10:31 AM       Glucose (POC)  90  04/26/2016 10:33 PM       Glucose (POC)  71  04/26/2016 05:12 PM       Glucose (POC)  106 (H)  04/26/2016 12:03 PM            Glucose (POC)  117 (H)  04/25/2016 04:52 PM          Lab Results         Component  Value  Date/Time            Color  YELLOW/STRAW  08/26/2018 02:25 PM       Appearance  CLEAR  08/26/2018 02:25 PM       Specific gravity  1.012  08/26/2018 02:25 PM       Specific gravity  1.025  04/22/2016 08:22 PM       pH (UA)  5.0  08/26/2018 02:25 PM       Protein  NEGATIVE   08/26/2018 02:25 PM       Glucose  NEGATIVE   08/26/2018 02:25 PM       Ketone  NEGATIVE   08/26/2018 02:25 PM       Bilirubin  NEGATIVE   08/26/2018 02:25 PM  Urobilinogen  0.2  08/26/2018 02:25 PM       Nitrites  NEGATIVE   08/26/2018 02:25 PM       Leukocyte Esterase  NEGATIVE   08/26/2018 02:25 PM       Epithelial cells  FEW  08/26/2018 02:25 PM       Bacteria  NEGATIVE   08/26/2018 02:25 PM       WBC  0-4  08/26/2018 02:25 PM            RBC  0-5  08/26/2018 02:25 PM                Medications Reviewed:          Current Facility-Administered  Medications          Medication  Dose  Route  Frequency           ?  lidocaine (XYLOCAINE) 20 mg/mL (2 %) injection            ?  iopamidol (ISOVUE 300) 61 % contrast injection            ?  fentaNYL citrate (PF) 50 mcg/mL injection            ?  0.9% sodium chloride infusion 250 mL   250 mL  IntraVENous  PRN     ?  potassium chloride SR (KLOR-CON 10) tablet 40 mEq   40 mEq  Oral  DAILY     ?  fentaNYL citrate (PF) injection 100 mcg   100 mcg  IntraVENous  Multiple     ?  acetaminophen (TYLENOL) tablet 650 mg   650 mg  Oral  Q4H PRN     ?  0.9% sodium chloride infusion   75 mL/hr  IntraVENous  CONTINUOUS     ?  predniSONE (DELTASONE) tablet 5 mg   5 mg  Oral  DAILY     ?  senna (SENOKOT) tablet 8.6 mg   1 Tab  Oral  QHS     ?  sodium chloride (NS) flush 5-40 mL   5-40 mL  IntraVENous  Q8H     ?  sodium chloride (NS) flush 5-40 mL   5-40 mL  IntraVENous  PRN     ?  LORazepam (ATIVAN) tablet 0.5 mg   0.5 mg  Oral  BID PRN     ?  oxyCODONE IR (ROXICODONE) tablet 5-10 mg   5-10 mg  Oral  Q4H PRN           ?  ondansetron (ZOFRAN) injection 4 mg   4 mg  IntraVENous  Q4H PRN        ______________________________________________________________________   EXPECTED LENGTH OF STAY: 3d 4h   ACTUAL LENGTH OF STAY:          5                    FAOZHYQ Sonnie Alamo, MD

## 2018-08-31 NOTE — Progress Notes (Signed)
Progress  Notes by Philmore Pali, MD at 08/31/18 1517                Author: Philmore Pali, MD  Service: Nephrology  Author Type: Physician       Filed: 08/31/18 1520  Date of Service: 08/31/18 1517  Status: Signed          Editor: Philmore Pali, MD (Physician)                                                                                                 NAME: Bruce Clark         DOB:  June 30, 1951         MRN:  [id number]          Assessment :     Plan:      --AKI- due to obstructive uropathy. UA is bland. No other cause to explain AKI. Basln Cr normal in Nov 2019   Hyponatremia   Obstructive Uropathy- likely due to prostate cancer   Metastatic Prostate and Lung Cancer   Liver mets   HTN  --Creatinine much better at 1.6mg /dl.  S/p bilateral PCN's.  Fair UO.        K low-> IV supplementation   REpeat level at 6pm      Poor po      Hgb down to 5.1. PRBC ordered.       D/W RN               Subjective:        Chief Complaint:  No events overnight.  B/L PCN draining. +Hematuria persists. No N/V  No pain.       Review of Systems:              Symptom  Y/N  Comments    Symptom  Y/N  Comments             Fever/Chills        Chest Pain                 Poor Appetite        Edema                 Cough        Abdominal Pain         Sputum        Joint Pain         SOB/DOE        Pruritis/Rash         Nausea/vomit        Tolerating PT/OT         Diarrhea        Tolerating Diet                 Constipation        Other               Could not obtain due to:            Objective:        VITALS:  Last 24hrs VS reviewed since prior progress note. Most recent are:   Visit Vitals      BP  (!) 142/93 (BP 1 Location: Right arm, BP Patient Position: At rest)     Pulse  86     Temp  98.8 ??F (37.1 ??C)     Resp  20     Wt  55.6 kg (122 lb 9.2 oz)     SpO2  100%        BMI  19.20 kg/m??           Intake/Output Summary (Last 24 hours) at 08/31/2018 1517   Last data filed at 08/31/2018 1443     Gross per 24 hour        Intake  2395.3  ml        Output  1305 ml        Net  1090.3 ml         Telemetry Reviewed:       PHYSICAL EXAM:   General: NAD   B/L PCN-> hematuria   No edema         Lab Data Reviewed: (see below)      Medications Reviewed: (see below)      PMH/SH reviewed - no change compared to H&P   ________________________________________________________________________   Care Plan discussed with:       Patient  Y           Family   Y           RN  Y           Care Manager                            Consultant:                     Comments         >50% of visit spent in counseling and coordination of care            ________________________________________________________________________   Adria Costley Sherrye Payor, MD       Procedures: see electronic medical records for all procedures/Xrays and details which   were not copied into this note but were reviewed prior to creation of Plan.        LABS:     Recent Labs            08/31/18   0615  08/31/18   0335     WBC  6.0  5.3     HGB  5.1*  5.1*     HCT  16.2*  16.2*         PLT  222  158          Recent Labs             08/31/18   0615  08/31/18   0336  08/30/18   0028     NA  138  139  135*     K  3.0*  2.6*  3.7     CL  102  106  100     CO2  26  24  25      BUN  40*  38*  55*     CREA  1.92*  1.65*  3.25*     GLU  113*  101*  126*     CA  8.5  7.8*  9.0  MG  2.2  1.3*   --           PHOS   --   2.6   --           Recent Labs           08/31/18   0615     SGOT  20     AP  71     TP  6.5     ALB  2.2*        GLOB  4.3*          Recent Labs             08/30/18   1514  08/30/18   0937  08/30/18   0028          APTT  60.4*  58.4*  55.8*         No results for input(s): FE, TIBC, PSAT, FERR in the last 72 hours.    No results found for: FOL, RBCF    No results for input(s): PH, PCO2, PO2 in the last 72 hours.   No results for input(s): CPK, CKMB in the last 72 hours.      No lab exists for component: TROPONINI   No components found for: [facility]     Lab Results         Component  Value  Date/Time             Color  YELLOW/STRAW  08/26/2018 02:25 PM       Appearance  CLEAR  08/26/2018 02:25 PM       Specific gravity  1.012  08/26/2018 02:25 PM       Specific gravity  1.025  04/22/2016 08:22 PM       pH (UA)  5.0  08/26/2018 02:25 PM       Protein  NEGATIVE   08/26/2018 02:25 PM       Glucose  NEGATIVE   08/26/2018 02:25 PM       Ketone  NEGATIVE   08/26/2018 02:25 PM       Bilirubin  NEGATIVE   08/26/2018 02:25 PM       Urobilinogen  0.2  08/26/2018 02:25 PM       Nitrites  NEGATIVE   08/26/2018 02:25 PM       Leukocyte Esterase  NEGATIVE   08/26/2018 02:25 PM       Epithelial cells  FEW  08/26/2018 02:25 PM       Bacteria  NEGATIVE   08/26/2018 02:25 PM       WBC  0-4  08/26/2018 02:25 PM            RBC  0-5  08/26/2018 02:25 PM           MEDICATIONS:     Current Facility-Administered Medications          Medication  Dose  Route  Frequency           ?  lidocaine (XYLOCAINE) 20 mg/mL (2 %) injection            ?  iopamidol (ISOVUE 300) 61 % contrast injection            ?  fentaNYL citrate (PF) 50 mcg/mL injection            ?  0.9% sodium chloride infusion 250 mL   250 mL  IntraVENous  PRN     ?  potassium chloride SR (KLOR-CON  10) tablet 40 mEq   40 mEq  Oral  DAILY     ?  fentaNYL citrate (PF) injection 100 mcg   100 mcg  IntraVENous  Multiple     ?  acetaminophen (TYLENOL) tablet 650 mg   650 mg  Oral  Q4H PRN     ?  0.9% sodium chloride infusion   75 mL/hr  IntraVENous  CONTINUOUS     ?  predniSONE (DELTASONE) tablet 5 mg   5 mg  Oral  DAILY     ?  senna (SENOKOT) tablet 8.6 mg   1 Tab  Oral  QHS     ?  sodium chloride (NS) flush 5-40 mL   5-40 mL  IntraVENous  Q8H     ?  sodium chloride (NS) flush 5-40 mL   5-40 mL  IntraVENous  PRN     ?  LORazepam (ATIVAN) tablet 0.5 mg   0.5 mg  Oral  BID PRN     ?  oxyCODONE IR (ROXICODONE) tablet 5-10 mg   5-10 mg  Oral  Q4H PRN           ?  ondansetron (ZOFRAN) injection 4 mg   4 mg  IntraVENous  Q4H PRN

## 2018-08-31 NOTE — Progress Notes (Signed)
Tempestt.Fulling - Informed by Verline Lema RN patient critical lab; paged NP for patient critical hgb 5.1.    0415 - NP White to place orders; NP to be called when type and cross is done on patient to come speak with patient for consent.    47 - Repaged NP.    Jareth.Covey - Informed NP type and screen is in process and of patient critical potassium. Orders to be placed.    0541 - Received call from blood bank that blood is ready for patient, informed them we would be down to get it as soon as consent was completed.    0610 - NP on floor, discussion on if labs could be diluted...redraws to be placed by NP. Will follow up.

## 2018-08-31 NOTE — Progress Notes (Signed)
 Patient tolerated procedure well.  Dr. Doree communicated with patient that unable to safely place IVC filter due to narrowing in vessel.  Patient communicated understanding.  Blood transfusion completed.  Vital signs stable.  Transferred back to room 605.      TRANSFER - OUT REPORT:    Verbal report given to Samantha,RN(name) on Quaid L Mattern  being transferred to room 605(unit) for routine post - op       Report consisted of patient's Situation, Background, Assessment and   Recommendations(SBAR).     Information from the following report(s) Procedure Summary was reviewed with the receiving nurse.    Lines:   Peripheral IV 08/26/18 Left Antecubital (Active)   Site Assessment Clean, dry, & intact 08/30/2018 10:15 PM   Phlebitis Assessment 0 08/30/2018  9:20 PM   Infiltration Assessment 0 08/30/2018  9:20 PM   Dressing Status Clean, dry, & intact 08/30/2018  9:20 PM   Dressing Type Transparent;Tape 08/30/2018  9:20 PM   Hub Color/Line Status Green;Infusing 08/30/2018  9:20 PM   Action Taken Open ports on tubing capped 08/30/2018  9:13 AM   Alcohol Cap Used Yes 08/30/2018  9:20 PM       Peripheral IV 08/30/18 Right Hand (Active)   Site Assessment Clean, dry, & intact 08/30/2018 10:15 PM   Phlebitis Assessment 0 08/30/2018  9:20 PM   Infiltration Assessment 0 08/30/2018  9:20 PM   Dressing Status Clean, dry, & intact 08/30/2018  9:20 PM   Dressing Type Transparent;Tape 08/30/2018  9:20 PM   Hub Color/Line Status Infusing 08/31/2018  6:43 AM   Action Taken Open ports on tubing capped 08/31/2018  6:43 AM   Alcohol Cap Used Yes 08/30/2018  9:20 PM        Opportunity for questions and clarification was provided.      Patient transported with:   hospital bed, hospital transport and nurse

## 2018-09-01 LAB — COMPREHENSIVE METABOLIC PANEL
ALT: <6 U/L — ABNORMAL LOW (ref 12–78)
AST: 20 U/L (ref 15–37)
Albumin/Globulin Ratio: 0.4 — ABNORMAL LOW (ref 1.1–2.2)
Albumin: 2.2 g/dL — ABNORMAL LOW (ref 3.5–5.0)
Alkaline Phosphatase: 66 U/L (ref 45–117)
Anion Gap: 9 mmol/L (ref 5–15)
BUN: 28 MG/DL — ABNORMAL HIGH (ref 6–20)
Bun/Cre Ratio: 20 (ref 12–20)
CO2: 26 mmol/L (ref 21–32)
Calcium: 8.7 MG/DL (ref 8.5–10.1)
Chloride: 103 mmol/L (ref 97–108)
Creatinine: 1.42 MG/DL — ABNORMAL HIGH (ref 0.70–1.30)
EGFR IF NonAfrican American: 50 mL/min/{1.73_m2} — ABNORMAL LOW (ref >60)
GFR African American: >60 mL/min/{1.73_m2} (ref >60)
Globulin: 4.9 g/dL — ABNORMAL HIGH (ref 2.0–4.0)
Glucose: 97 mg/dL (ref 65–100)
Potassium: 3.2 mmol/L — ABNORMAL LOW (ref 3.5–5.1)
Sodium: 138 mmol/L (ref 136–145)
Total Bilirubin: 0.3 MG/DL (ref 0.2–1.0)
Total Protein: 7.1 g/dL (ref 6.4–8.2)

## 2018-09-01 LAB — CBC WITH AUTO DIFFERENTIAL
Basophils %: 0 % (ref 0–1)
Basophils Absolute: 0 10*3/uL (ref 0.0–0.1)
Eosinophils %: 1 % (ref 0–7)
Eosinophils Absolute: 0.1 10*3/uL (ref 0.0–0.4)
Granulocyte Absolute Count: 0 10*3/uL
Hematocrit: 16.2 % — CL (ref 36.6–50.3)
Hemoglobin: 5.1 g/dL — CL (ref 12.1–17.0)
Immature Granulocytes: 0 %
Lymphocytes %: 9 % — ABNORMAL LOW (ref 12–49)
Lymphocytes Absolute: 0.5 10*3/uL — ABNORMAL LOW (ref 0.8–3.5)
MCH: 27.9 PG (ref 26.0–34.0)
MCHC: 31.5 g/dL (ref 30.0–36.5)
MCV: 88.5 FL (ref 80.0–99.0)
MPV: 9.4 FL (ref 8.9–12.9)
Monocytes %: 9 % (ref 5–13)
Monocytes Absolute: 0.5 10*3/uL (ref 0.0–1.0)
NRBC Absolute: 0 10*3/uL (ref 0.00–0.01)
Neutrophils %: 81 % — ABNORMAL HIGH (ref 32–75)
Neutrophils Absolute: 4.9 10*3/uL (ref 1.8–8.0)
Nucleated RBCs: 0 PER 100 WBC
Platelets: 222 10*3/uL (ref 150–400)
RBC: 1.83 M/uL — ABNORMAL LOW (ref 4.10–5.70)
RDW: 13.5 % (ref 11.5–14.5)
WBC: 6 10*3/uL (ref 4.1–11.1)

## 2018-09-01 LAB — TYPE AND CROSSMATCH
ABO/Rh: A POS
Antibody Screen: NEGATIVE
Status: TRANSFUSED
Unit Divison: 0

## 2018-09-01 LAB — MAGNESIUM
Magnesium: 1.6 mg/dL (ref 1.6–2.4)
Magnesium: 1.6 mg/dL (ref 1.6–2.4)

## 2018-09-01 LAB — HEMOGLOBIN AND HEMATOCRIT
Hematocrit: 22.9 % — ABNORMAL LOW (ref 36.6–50.3)
Hemoglobin: 7.4 g/dL — ABNORMAL LOW (ref 12.1–17.0)

## 2018-09-01 LAB — PHOSPHORUS
Phosphorus: 2.1 MG/DL — ABNORMAL LOW (ref 2.6–4.7)
Phosphorus: 2.1 MG/DL — ABNORMAL LOW (ref 2.6–4.7)

## 2018-09-01 LAB — CBC WITH AUTOMATED DIFF
ABS. BASOPHILS: 0 10*3/uL (ref 0.0–0.1)
ABS. EOSINOPHILS: 0.1 10*3/uL (ref 0.0–0.4)
ABS. IMM. GRANS.: 0 10*3/uL
ABS. LYMPHOCYTES: 0.5 10*3/uL — ABNORMAL LOW (ref 0.8–3.5)
ABS. MONOCYTES: 0.5 10*3/uL (ref 0.0–1.0)
ABS. NEUTROPHILS: 4.9 10*3/uL (ref 1.8–8.0)
ABSOLUTE NRBC: 0 10*3/uL (ref 0.00–0.01)
BASOPHILS: 0 % (ref 0–1)
EOSINOPHILS: 1 % (ref 0–7)
HCT: 16.2 % — CL (ref 36.6–50.3)
HGB: 5.1 g/dL — CL (ref 12.1–17.0)
IMMATURE GRANULOCYTES: 0 %
LYMPHOCYTES: 9 % — ABNORMAL LOW (ref 12–49)
MCH: 27.9 PG (ref 26.0–34.0)
MCHC: 31.5 g/dL (ref 30.0–36.5)
MCV: 88.5 FL (ref 80.0–99.0)
MONOCYTES: 9 % (ref 5–13)
MPV: 9.4 FL (ref 8.9–12.9)
NEUTROPHILS: 81 % — ABNORMAL HIGH (ref 32–75)
NRBC: 0 PER 100 WBC
PLATELET: 222 10*3/uL (ref 150–400)
RBC: 1.83 M/uL — ABNORMAL LOW (ref 4.10–5.70)
RDW: 13.5 % (ref 11.5–14.5)
WBC: 6 10*3/uL (ref 4.1–11.1)

## 2018-09-01 LAB — METABOLIC PANEL, COMPREHENSIVE
A-G Ratio: 0.4 — ABNORMAL LOW (ref 1.1–2.2)
ALT (SGPT): <6 U/L — ABNORMAL LOW (ref 12–78)
AST (SGOT): 20 U/L (ref 15–37)
Albumin: 2.2 g/dL — ABNORMAL LOW (ref 3.5–5.0)
Alk. phosphatase: 66 U/L (ref 45–117)
Anion gap: 9 mmol/L (ref 5–15)
BUN/Creatinine ratio: 20 (ref 12–20)
BUN: 28 MG/DL — ABNORMAL HIGH (ref 6–20)
Bilirubin, total: 0.3 MG/DL (ref 0.2–1.0)
CO2: 26 mmol/L (ref 21–32)
Calcium: 8.7 MG/DL (ref 8.5–10.1)
Chloride: 103 mmol/L (ref 97–108)
Creatinine: 1.42 MG/DL — ABNORMAL HIGH (ref 0.70–1.30)
GFR est AA: >60 mL/min/{1.73_m2} (ref >60)
GFR est non-AA: 50 mL/min/{1.73_m2} — ABNORMAL LOW (ref >60)
Globulin: 4.9 g/dL — ABNORMAL HIGH (ref 2.0–4.0)
Glucose: 97 mg/dL (ref 65–100)
Potassium: 3.2 mmol/L — ABNORMAL LOW (ref 3.5–5.1)
Protein, total: 7.1 g/dL (ref 6.4–8.2)
Sodium: 138 mmol/L (ref 136–145)

## 2018-09-01 LAB — TYPE + CROSSMATCH
ABO/Rh(D): A POS
Antibody screen: NEGATIVE
Status of unit: TRANSFUSED
Unit division: 0

## 2018-09-01 LAB — HGB & HCT
HCT: 22.9 % — ABNORMAL LOW (ref 36.6–50.3)
HGB: 7.4 g/dL — ABNORMAL LOW (ref 12.1–17.0)

## 2018-09-01 MED ORDER — MAGNESIUM SULFATE 2 GRAM/50 ML IVPB
2 gram/50 mL (4 %) | Freq: Once | INTRAVENOUS | Status: AC
Start: 2018-09-01 — End: 2018-09-01
  Administered 2018-09-01: 16:00:00 via INTRAVENOUS

## 2018-09-01 MED ORDER — POTASSIUM CHLORIDE SR 10 MEQ TAB
10 mEq | Freq: Two times a day (BID) | ORAL | Status: DC
Start: 2018-09-01 — End: 2018-09-02
  Administered 2018-09-02 (×2): via ORAL

## 2018-09-01 MED FILL — NORMAL SALINE FLUSH 0.9 % INJECTION SYRINGE: INTRAMUSCULAR | Qty: 10

## 2018-09-01 MED FILL — SENNA LAX 8.6 MG TABLET: 8.6 mg | ORAL | Qty: 1

## 2018-09-01 MED FILL — K-TAB 10 MEQ TABLET,EXTENDED RELEASE: 10 mEq | ORAL | Qty: 4

## 2018-09-01 MED FILL — MAGNESIUM SULFATE 2 GRAM/50 ML IVPB: 2 gram/50 mL (4 %) | INTRAVENOUS | Qty: 50

## 2018-09-01 MED FILL — PREDNISONE 5 MG TAB: 5 mg | ORAL | Qty: 1

## 2018-09-01 NOTE — Progress Notes (Signed)
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 male with stage IV  prostate and now stage IV NSCLC admitted with acute renal failure, R extensive DVT and R chest wall pain      Today   Pain well controlled   S/p bilateral PCN 08/27/2018- decreased hematuria   His leg is some what less swollen    Remains irritable and is tired           Past Medical History:        Diagnosis  Date         ?  Lung cancer ([place])       ?  Prostate cancer ([place])       ?  Stroke [facility])            PT STATES, "I HAD A STROKE A LONG TIME AGO"             Past Surgical History:         Procedure  Laterality  Date          ?  CHEST SURGERY PROCEDURE UNLISTED    04/27/2017          BRONCHOSCOPY/MEDIASTINOSCOPY          ?  HX GI              HERNIA REPAIR          ?  HX ORTHOPAEDIC    1994          ROD IN RIGHT LEG          ?  IR NEPHROSTOMY PERC LT PLC CATH  SI    08/27/2018          ?  IR NEPHROSTOMY PERC RT PLC CATH  SI    08/27/2018           No Known Allergies        Current Facility-Administered Medications             Medication  Dose  Route  Frequency  Provider  Last Rate  Last Dose              ?  0.9% sodium chloride infusion 250 mL   250 mL  IntraVENous  PRN  Rita Ohara, NP           ?  potassium chloride SR (KLOR-CON 10) tablet 40 mEq   40 mEq  Oral  DAILY  Debeb, OVFIEPP T, MD     40 mEq at 09/01/18 0928     ?  fentaNYL citrate (PF) injection 100 mcg   100 mcg  IntraVENous  Multiple  Dorene Grebe, Namit, MD     50 mcg  at 08/31/18 1405     ?   acetaminophen (TYLENOL) tablet 650 mg   650 mg  Oral  Q4H PRN  Abigail Miyamoto, MD     650 mg at 08/29/18 2104     ?  0.9% sodium chloride infusion   75 mL/hr  IntraVENous  CONTINUOUS  Darlin Coco III, MD  75 mL/hr at 08/30/18 1627  75 mL/hr at 08/30/18 1627     ?  predniSONE (DELTASONE) tablet 5 mg   5 mg  Oral  DAILY  Emilio Math, MD     5 mg at 09/01/18 1607     ?  senna (SENOKOT) tablet 8.6 mg   1 Tab  Oral  QHS  Emilio Math, MD     8.6 mg at 08/31/18 2205     ?  sodium chloride (NS) flush 5-40 mL   5-40 mL  IntraVENous  Q8H  Emilio Math, MD     10 mL at 09/01/18 0541     ?  sodium chloride (NS) flush 5-40 mL   5-40 mL  IntraVENous  PRN  Emilio Math, MD           ?  LORazepam (ATIVAN) tablet 0.5 mg   0.5 mg  Oral  BID PRN  Emilio Math, MD           ?  oxyCODONE IR (ROXICODONE) tablet 5-10 mg   5-10 mg  Oral  Q4H PRN  Emilio Math, MD     10 mg at 08/30/18 1611              ?  ondansetron (ZOFRAN) injection 4 mg   4 mg  IntraVENous  Q4H PRN  Emilio Math, MD                   Social History          Socioeconomic History         ?  Marital status:  SINGLE              Spouse name:  Not on file         ?  Number of children:  Not on file     ?  Years of education:  Not on file     ?  Highest education level:  Not on file       Tobacco Use         ?  Smoking status:  Former Smoker              Packs/day:  0.50         Years:  47.00         Pack years:  23.50         Last attempt to quit:  06/2016         Years since quitting:  2.2         ?  Smokeless tobacco:  Never Used       Substance and Sexual Activity         ?  Alcohol use:  No         ?  Drug use:  No             Family History         Problem  Relation  Age of Onset          ?  Cancer  Mother  COLON          ?  Cancer  Father                BRAIN TUMOR          ?  No Known Problems  Sister       ?  Arthritis-osteo  Brother       ?  No Known Problems  Sister       ?  No Known Problems  Sister       ?  No Known  Problems  Sister       ?  No Known Problems  Brother            ?  Anesth Problems  Neg Hx          ROS   A review of systems was obtained and is negative except as listed in HPI.   ECOG PS is 2      Physical Examination:    Visit Vitals      BP  110/74 (BP 1 Location: Left arm, BP Patient Position: At rest)     Pulse  (!) 103     Temp  98.6 ??F (37 ??C)     Resp  18         Wt  122 lb 9.2 oz (55.6 kg)  Comment: NO DRAINS HANGING FROM BED        SpO2  100%        BMI  19.20 kg/m??        General appearance - alert, appears fatigued and ill   Mental status - oriented to person, place, and time   Mouth - mucous membranes moist, no lesions noted   Neck - supple, no significant adenopathy   ABD- soft. Foley in place. Bilateral PCN with serosanguinous urine   Extremities - peripheral pulses normal, 1+ LLE edema         LABS     Lab Results         Component  Value  Date/Time            WBC  6.0  08/31/2018 06:15 AM       HGB  7.4 (L)  09/01/2018 06:11 AM       HCT  22.9 (L)  09/01/2018 06:11 AM       PLATELET  222  08/31/2018 06:15 AM       MCV  88.5  08/31/2018 06:15 AM            ABS. NEUTROPHILS  4.9  08/31/2018 06:15 AM          Lab Results         Component  Value  Date/Time            Sodium  138  09/01/2018 06:11 AM       Potassium  3.2 (L)  09/01/2018 06:11 AM       Chloride  103  09/01/2018 06:11 AM       CO2  26  09/01/2018 06:11 AM       Glucose  97  09/01/2018 06:11 AM       BUN  28 (H)  09/01/2018 06:11 AM       Creatinine  1.42 (H)  09/01/2018 06:11 AM       GFR est AA  >60  09/01/2018 06:11 AM       GFR est non-AA  50 (L)  09/01/2018 06:11 AM  Calcium  8.7  09/01/2018 06:11 AM          Lab Results         Component  Value  Date/Time            AST (SGOT)  20  09/01/2018 06:11 AM       Alk. phosphatase  66  09/01/2018 06:11 AM       Protein, total  7.1  09/01/2018 06:11 AM       Albumin  2.2 (L)  09/01/2018 06:11 AM       Globulin  4.9 (H)  09/01/2018 06:11 AM            A-G Ratio  0.4 (L)   09/01/2018 06:11 AM          Recent Labs                 08/26/18   1141  07/05/18   0850  05/11/18   0959  03/24/18   0949  02/17/18   0851  01/01/18   0811  11/27/17   0806     PSA  0.1  0.0*   --   0.1  0.1  0.1  0.1              PSALT   --    --   <0.1   --    --    --    --            IMAGING   PET CT  03/23/17   IMPRESSION: Mass lesion right lower lobe is hypermetabolic and compatible with   the biopsy confirmed the result of adenocarcinoma. There are soft tissue   densities in the anterior abdominal wall bilaterally which are stable compared   to the prior chest abdomen pelvis CT but new compared to the prior PET/CT.   Please correlate with any recent abdominal wall surgery as these may represent   port sites. Otherwise normal tracer distribution. Stable sclerotic osseous   metastatic disease without abnormal activity.      PATHOLOGY   Supraclavicular node    CYTOLOGIC INTERPRETATION:    Adenocarcinoma, consistent with prostate primary    See comment    General Categorization    Positive for malignancy.    Specimen Adequacy    Satisfactory for evaluation.    Comment    Touch preps and a core biopsy are examined and show islands of adenocarcinoma surrounded by fibrous stroma. A panel of immunohistochemical stains was performed  to evaluate site of origin. The tumor cells are focally positive for PSA and PSAP and negative for CK7, CK20, TTF-1      CT CAP 03/22/18   IMPRESSION   IMPRESSION:    1. Status post right lung surgery with right perihilar consolidation and   scarring consistent with post surgery and radiation therapy change. These   findings are stable.   2. Enlarged prostate.   3. Sclerotic bone metastases unchanged.   4. Atherosclerotic aorta without aneurysm      CT CAP 07/13/18:    IMPRESSION:    1. Increased size of the prostate and increased nodularity involving the bladder   base and bladder wall.   2. New left hydronephrosis and hydroureter with obstruction at the level of the   ureterovesical  junction.   3. Status post right lung surgery with elevation of the right hemidiaphragm and   surgical staples and scarring in the right hilum consistent with  postsurgical   and postradiation therapy effect, unchanged.   4. Sclerotic bone metastases to the spine unchanged.   5. New low-attenuation liver lesion could represent a metastasis.   6. Dilated fluid-filled esophagus suggesting esophageal dysmotility with   thickened wall possibly secondary to radiation therapy.   7. Atherosclerotic aorta with coronary artery calcifications. No abdominal   aortic aneurysm.      CT 08/26/2018      IMPRESSION   IMPRESSION:   ??   1. Unchanged appearance of right hemithorax as above.   2. Increased number and size of hypoattenuating lesions of the liver consistent   with worsening hepatic metastatic disease.   3. Bilateral renal pelvocaliectasis and ureteral distention, new on the right   and increased on the left.   4. Foley catheter within urinary bladder which cannot be further assessed.   5. Osteosclerotic metastatic disease.   6. Anasarca appearance demonstrated in the interval.    ??   Pathology from liver biopsy 08/17/18:    Specimen Source    1: Liver, Core biopsy with touch intepretation:    CYTOLOGIC INTERPRETATION:    1. Liver, Core biopsy with touch intepretation:    Poorly differentiated adenocarcinoma consistent with metastatic pulmonary adenocarcinoma    See comment    General Categorization    Positive for malignancy.          ASSESSMENT   Bruce Clark is a  68 y.o. male with widely metastatic prostate adenocarcinoma and lung adenocarcinoma status  post right lower lobe lobectomy on 05/22/17. Most recent imaging concerning for progression with new liver mets. He is admitted for acute renal failure      PLAN      Acute renal failure   Post obstructive   CT with Bilateral renal pelvocaliectasis and ureteral distention, new on the right   and increased on the left- likely due to previously seen progressive prostate  cancer possibly infiltrating the bladder   S/p PCN bilaterally    Urology and Nephrology inputs are appreciated   Creatinine dropped to 1.6 , upto 1.9 today- likely a prerenal component from poor intake and blood loss   Transfusions and hydration per nephrology         Lower extremity edema and h/o DVT   Diagnosed 04/22/16   Cancer associated   On lifelong Eliquis 87m BID    Now with marked RLE   Venous duplex obtained in ER with Extensive DVT   Unfortunately he had major bleeding on heparin gtt   We sent him for an IVC filter but his anatomy is not conducive to this   The IVC is occluded by extrinsic compression from? D/W Dr. MDorene Grebe     It does appear that most of the clot is chronic and the anatomy is such that risk for PE is low      Risk of bleeding at the moment will not [facility]         Prostate cancer stage IV   Castrate sensitive, treatment na??ve widely metastatic prostate cancer to lymph nodes above and below the diaphragm and bones.   Prostate adenocarcinoma diagnosed on biopsy of the supraclavicular lymph node. Discussed in tumor board and a small cell component has been ruled out.   PSA at the time of diagnosis at 2400.   ??   Currently on Lupron 45 mg every 6 months. On Zytiga 1,0016mdaily + prednisone 5 mg daily which  has controlled his disease now for > 2 years   Though his PSA is still undetectable at 0.1 his prior scans especially the bladder masses suggest progressive prostate cancer and probable castrate resistance   We will hold Zytiga inpatient    Will send an [place] outpatient and consider switching to [facility]   ??   R lung adenocarcinoma, T3N2MX stage IIIB- Parietal  pleural involvement- PD-L1 99%   S/p RLL lobectomy on 05/22/17 then received PORT   Now with biopsy proven liver metastases   Repeat CT CAP pending   Plan on palliative Keytruda OP      He is scheduled to start this 09/08/2018         Bone metastases   Diffuse   Completed palliative radiation that was  initiated on 06/09/16         Right sided chest wall pain   Unclear cause   No corresponding mets   Controlled      Anemia    Due to progressive cancer, renal failure, hematuria   Transfuse if < 7 g/dl          Will continue to follow    If he has a stable Hb tomorrow could be discharged with PCN in place    He is to be seen by [place] at 2 pm on 09/08/2018   We will request our navigator to follow   D/W His POA and Dr. Jerl Santos MD, [facility]   '

## 2018-09-01 NOTE — Progress Notes (Signed)
Physical Therapy  Orders received and chart reviewed.  Met with patient who politely declined evaluation on this date to extreme fatigue.  Noted to have received blood earlier today.  States is independent at home with occasional use of cane.  Nurse reports walks to bathroom with assist of one.  Will follow up tomorrow for evaluation.  Nicko Daher S Aibhlinn Kalmar, PT

## 2018-09-01 NOTE — Progress Notes (Signed)
NAME: Bruce Clark        DOB:  23-Jan-1951        MRN:  [id number]        Assessment :    Plan:  --AKI- due to obstructive uropathy. UA is bland. No other cause to explain AKI. Basln Cr normal in Nov 2019  Hyponatremia  Obstructive Uropathy- likely due to prostate cancer  Metastatic Prostate and Lung Cancer  Liver mets  HTN --Creatinine continues to improve at 1.4mg /dl.  S/p bilateral PCN's.  Fair UO.      Intake still poor. Hopefully stop fluids by tomorrow    Oral KCl  IV Mag sulfate    Hgb better s/p PRBC yesterday    D/W RN         Subjective:     Chief Complaint:  No events overnight. No N/V  No pain.     Review of Systems:    Symptom Y/N Comments  Symptom Y/N Comments   Fever/Chills    Chest Pain     Poor Appetite    Edema     Cough    Abdominal Pain     Sputum    Joint Pain     SOB/DOE    Pruritis/Rash     Nausea/vomit    Tolerating PT/OT     Diarrhea    Tolerating Diet     Constipation    Other       Could not obtain due to:      Objective:     VITALS:   Last 24hrs VS reviewed since prior progress note. Most recent are:  Visit Vitals  BP 110/74 (BP 1 Location: Left arm, BP Patient Position: At rest)   Pulse (!) 103   Temp 98.6 ??F (37 ??C)   Resp 18   Wt 55.6 kg (122 lb 9.2 oz)   SpO2 100%   BMI 19.20 kg/m??       Intake/Output Summary (Last 24 hours) at 09/01/2018 1448  Last data filed at 09/01/2018 0841  Gross per 24 hour   Intake ???   Output 2095 ml   Net -2095 ml      Telemetry Reviewed:     PHYSICAL EXAM:  General: NAD  B/L PCN-> hematuria  RLE edema      Lab Data Reviewed: (see below)    Medications Reviewed: (see below)    PMH/SH reviewed - no change compared to H&P  ________________________________________________________________________  Care Plan discussed with:  Patient Y    Family      RN Y    Care Manager                    Consultant:          Comments   >50% of visit spent in counseling and coordination of care        ________________________________________________________________________  Jenalee Trevizo Sherrye Payor, MD     Procedures: see electronic medical records for all procedures/Xrays and details which  were not copied into this note but were reviewed prior to creation of Plan.      LABS:  Recent Labs     09/01/18  0611 08/31/18  1725 08/31/18  0615 08/31/18  0335   WBC  --   --  6.0 5.3   HGB 7.4* 8.4* 5.1* 5.1*   HCT 22.9* 25.8* 16.2* 16.2*   PLT  --   --  222 158     Recent Labs  09/01/18  0611 08/31/18  1725 08/31/18  0615 08/31/18  0336   NA 138  --  138 139   K 3.2* 3.7 3.0* 2.6*   CL 103  --  102 106   CO2 26  --  26 24   BUN 28*  --  40* 38*   CREA 1.42*  --  1.92* 1.65*   GLU 97  --  113* 101*   CA 8.7  --  8.5 7.8*   MG 1.6  --  2.2 1.3*   PHOS 2.1*  --   --  2.6     Recent Labs     09/01/18  0611 08/31/18  0615   SGOT 20 20   AP 66 71   TP 7.1 6.5   ALB 2.2* 2.2*   GLOB 4.9* 4.3*     Recent Labs     08/30/18  1514 08/30/18  0937 08/30/18  0028   APTT 60.4* 58.4* 55.8*      No results for input(s): FE, TIBC, PSAT, FERR in the last 72 hours.   No results found for: FOL, RBCF   No results for input(s): PH, PCO2, PO2 in the last 72 hours.  No results for input(s): CPK, CKMB in the last 72 hours.    No lab exists for component: TROPONINI  No components found for: [facility]  Lab Results   Component Value Date/Time    Color YELLOW/STRAW 08/26/2018 02:25 PM    Appearance CLEAR 08/26/2018 02:25 PM    Specific gravity 1.012 08/26/2018 02:25 PM    Specific gravity 1.025 04/22/2016 08:22 PM    pH (UA) 5.0 08/26/2018 02:25 PM    Protein NEGATIVE  08/26/2018 02:25 PM    Glucose NEGATIVE  08/26/2018 02:25 PM    Ketone NEGATIVE  08/26/2018 02:25 PM    Bilirubin NEGATIVE  08/26/2018 02:25 PM    Urobilinogen 0.2 08/26/2018 02:25 PM    Nitrites NEGATIVE  08/26/2018 02:25 PM    Leukocyte Esterase NEGATIVE  08/26/2018 02:25 PM    Epithelial cells FEW 08/26/2018 02:25 PM    Bacteria NEGATIVE  08/26/2018 02:25 PM    WBC 0-4 08/26/2018 02:25 PM     RBC 0-5 08/26/2018 02:25 PM       MEDICATIONS:  Current Facility-Administered Medications   Medication Dose Route Frequency   ??? 0.9% sodium chloride infusion 250 mL  250 mL IntraVENous PRN   ??? potassium chloride SR (KLOR-CON 10) tablet 40 mEq  40 mEq Oral DAILY   ??? fentaNYL citrate (PF) injection 100 mcg  100 mcg IntraVENous Multiple   ??? acetaminophen (TYLENOL) tablet 650 mg  650 mg Oral Q4H PRN   ??? 0.9% sodium chloride infusion  75 mL/hr IntraVENous CONTINUOUS   ??? predniSONE (DELTASONE) tablet 5 mg  5 mg Oral DAILY   ??? senna (SENOKOT) tablet 8.6 mg  1 Tab Oral QHS   ??? sodium chloride (NS) flush 5-40 mL  5-40 mL IntraVENous Q8H   ??? sodium chloride (NS) flush 5-40 mL  5-40 mL IntraVENous PRN   ??? LORazepam (ATIVAN) tablet 0.5 mg  0.5 mg Oral BID PRN   ??? oxyCODONE IR (ROXICODONE) tablet 5-10 mg  5-10 mg Oral Q4H PRN   ??? ondansetron (ZOFRAN) injection 4 mg  4 mg IntraVENous Q4H PRN

## 2018-09-01 NOTE — Other (Deleted)
Patient admitted with acute renal failure, noted to have a BMI of 19.2. with a 34 pound weight loss in past year. If possible, please document in progress notes and d/c summary if you are evaluating and/or treating any of the following:    =>Chronic Severe Protein-Calorie Malnutrition  => Chronic Moderate Protein-Calorie Malnutrition  =>Other Explanation of clinical findings  =>Clinically Undetermined (no explanation for clinical findings)    The medical record reflects the following:     Risk Factors: Hx. Prostate and lung cancer s/p RT, liver metastasis     Clinical Indicators:  On 05/22/17, BMI 24.4   BMI 19.2 on 08/29/18-this is a 34 pound weight loss since last hospitalization  1/9-Per oncology-He has a poor appetite and has lost weight. He denies diarrhea and has intermittent constipation.??  1/12-Low wt status suppliments   1/14-per PN-No acute distress, weak, debilitated, and  Emaciated, appearing.      Treatment: supplements, daily weights,monitorlabwork    Please clarify and document your clinical opinion in the progress notes and discharge summary including the definitive and/or presumptive diagnosis, (suspected or probable), related to the above clinical findings. Please include clinical findings supporting your diagnosis.

## 2018-09-01 NOTE — Progress Notes (Signed)
TRANSITION OF CARE  TBD    CM reviewed case with treatment team during [place].  Patient has bilateral nephrostomy tubes. Patient continues with swollen legs and leg DVT with procedure yesterday reported unsuccessful. Patient also has lower HGB.

## 2018-09-01 NOTE — Progress Notes (Signed)
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 is a 68 y.o. male with stage IV prostate and now stage IV NSCLC admitted with acute renal failure, R extensive DVT and R chest wall pain    Today  Pain well controlled  S/p bilateral PCN 08/27/2018- decreased hematuria  His leg is some what less swollen   Remains irritable and is tired      Past Medical History:   Diagnosis Date   ??? Lung cancer ([place])    ??? Prostate cancer ([place])    ??? Stroke ([place])     PT STATES, "I HAD A STROKE A LONG TIME AGO"       Past Surgical History:   Procedure Laterality Date   ??? CHEST SURGERY PROCEDURE UNLISTED  04/27/2017    BRONCHOSCOPY/MEDIASTINOSCOPY   ??? HX GI      HERNIA REPAIR   ??? HX ORTHOPAEDIC  1994    ROD IN RIGHT LEG   ??? IR NEPHROSTOMY PERC LT PLC CATH  [place]  08/27/2018   ??? IR NEPHROSTOMY PERC RT PLC CATH  [place]  08/27/2018       No Known Allergies    Current Facility-Administered Medications   Medication Dose Route Frequency Provider Last Rate Last Dose   ??? 0.9% sodium chloride infusion 250 mL  250 mL IntraVENous PRN Rita Ohara, NP       ??? potassium chloride SR (KLOR-CON 10) tablet 40 mEq  40 mEq Oral DAILY Debeb, FGHWEXH T, MD   40 mEq at 09/01/18 0928   ??? fentaNYL citrate (PF) injection 100 mcg  100 mcg IntraVENous Multiple Arther Abbott, MD   50 mcg at 08/31/18 1405   ??? acetaminophen (TYLENOL) tablet 650 mg  650 mg Oral Q4H PRN Pamala Duffel T, MD   650 mg at 08/29/18 2104   ??? 0.9% sodium chloride infusion  75 mL/hr IntraVENous CONTINUOUS Darlin Coco III, MD 75 mL/hr at 08/30/18 1627 75 mL/hr at 08/30/18 1627   ??? predniSONE (DELTASONE) tablet 5 mg  5 mg Oral DAILY Emilio Math, MD   5 mg at 09/01/18 3716   ??? senna (SENOKOT) tablet 8.6 mg  1 Tab Oral QHS Emilio Math, MD   8.6 mg at 08/31/18 2205    ??? sodium chloride (NS) flush 5-40 mL  5-40 mL IntraVENous Q8H Emilio Math, MD   10 mL at 09/01/18 0541   ??? sodium chloride (NS) flush 5-40 mL  5-40 mL IntraVENous PRN Emilio Math, MD       ??? LORazepam (ATIVAN) tablet 0.5 mg  0.5 mg Oral BID PRN Emilio Math, MD       ??? oxyCODONE IR (ROXICODONE) tablet 5-10 mg  5-10 mg Oral Q4H PRN Emilio Math, MD   10 mg at 08/30/18 1611   ??? ondansetron (ZOFRAN) injection 4 mg  4 mg IntraVENous Q4H PRN Emilio Math, MD           Social History     Socioeconomic History   ??? Marital status: SINGLE     Spouse name: Not on file   ??? Number of children: Not on file   ??? Years of education: Not on file   ??? Highest education level: Not on file   Tobacco Use   ???  Smoking status: Former Smoker     Packs/day: 0.50     Years: 47.00     Pack years: 23.50     Last attempt to quit: 06/2016     Years since quitting: 2.2   ??? Smokeless tobacco: Never Used   Substance and Sexual Activity   ??? Alcohol use: No   ??? Drug use: No       Family History   Problem Relation Age of Onset   ??? Cancer Mother         COLON   ??? Cancer Father         BRAIN TUMOR   ??? No Known Problems Sister    ??? Arthritis-osteo Brother    ??? No Known Problems Sister    ??? No Known Problems Sister    ??? No Known Problems Sister    ??? No Known Problems Brother    ??? Anesth Problems Neg Hx      ROS  A review of systems was obtained and is negative except as listed in HPI.  ECOG PS is 2    Physical Examination:   Visit Vitals  BP 110/74 (BP 1 Location: Left arm, BP Patient Position: At rest)   Pulse (!) 103   Temp 98.6 ??F (37 ??C)   Resp 18   Wt 122 lb 9.2 oz (55.6 kg) Comment: NO DRAINS HANGING FROM BED   SpO2 100%   BMI 19.20 kg/m??     General appearance - alert, appears fatigued and ill  Mental status - oriented to person, place, and time  Mouth - mucous membranes moist, no lesions noted  Neck - supple, no significant adenopathy  ABD- soft. Foley in place. Bilateral PCN with serosanguinous urine   Extremities - peripheral pulses normal, 1+ LLE edema      LABS  Lab Results   Component Value Date/Time    WBC 6.0 08/31/2018 06:15 AM    HGB 7.4 (L) 09/01/2018 06:11 AM    HCT 22.9 (L) 09/01/2018 06:11 AM    PLATELET 222 08/31/2018 06:15 AM    MCV 88.5 08/31/2018 06:15 AM    ABS. NEUTROPHILS 4.9 08/31/2018 06:15 AM     Lab Results   Component Value Date/Time    Sodium 138 09/01/2018 06:11 AM    Potassium 3.2 (L) 09/01/2018 06:11 AM    Chloride 103 09/01/2018 06:11 AM    CO2 26 09/01/2018 06:11 AM    Glucose 97 09/01/2018 06:11 AM    BUN 28 (H) 09/01/2018 06:11 AM    Creatinine 1.42 (H) 09/01/2018 06:11 AM    GFR est AA >60 09/01/2018 06:11 AM    GFR est non-AA 50 (L) 09/01/2018 06:11 AM    Calcium 8.7 09/01/2018 06:11 AM     Lab Results   Component Value Date/Time    AST (SGOT) 20 09/01/2018 06:11 AM    Alk. phosphatase 66 09/01/2018 06:11 AM    Protein, total 7.1 09/01/2018 06:11 AM    Albumin 2.2 (L) 09/01/2018 06:11 AM    Globulin 4.9 (H) 09/01/2018 06:11 AM    A-G Ratio 0.4 (L) 09/01/2018 06:11 AM     Recent Labs     08/26/18  1141 07/05/18  0850 05/11/18  0959 03/24/18  0949 02/17/18  0851 01/01/18  0811 11/27/17  0806   PSA 0.1 0.0*  --  0.1 0.1 0.1 0.1   PSALT  --   --  <0.1  --   --   --   --  IMAGING  PET CT  03/23/17  IMPRESSION: Mass lesion right lower lobe is hypermetabolic and compatible with  the biopsy confirmed the result of adenocarcinoma. There are soft tissue  densities in the anterior abdominal wall bilaterally which are stable compared  to the prior chest abdomen pelvis CT but new compared to the prior PET/CT.  Please correlate with any recent abdominal wall surgery as these may represent  port sites. Otherwise normal tracer distribution. Stable sclerotic osseous  metastatic disease without abnormal activity.    PATHOLOGY  Supraclavicular node   CYTOLOGIC INTERPRETATION:   Adenocarcinoma, consistent with prostate primary   See comment   General Categorization   Positive for malignancy.    Specimen Adequacy   Satisfactory for evaluation.   Comment   Touch preps and a core biopsy are examined and show islands of adenocarcinoma surrounded by fibrous stroma. A panel of immunohistochemical stains was performed to evaluate site of origin. The tumor cells are focally positive for PSA and PSAP and negative for CK7, CK20, TTF-1    CT CAP 03/22/18  IMPRESSION  IMPRESSION:   1. Status post right lung surgery with right perihilar consolidation and  scarring consistent with post surgery and radiation therapy change. These  findings are stable.  2. Enlarged prostate.  3. Sclerotic bone metastases unchanged.  4. Atherosclerotic aorta without aneurysm    CT CAP 07/13/18:   IMPRESSION:   1. Increased size of the prostate and increased nodularity involving the bladder  base and bladder wall.  2. New left hydronephrosis and hydroureter with obstruction at the level of the  ureterovesical junction.  3. Status post right lung surgery with elevation of the right hemidiaphragm and  surgical staples and scarring in the right hilum consistent with postsurgical  and postradiation therapy effect, unchanged.  4. Sclerotic bone metastases to the spine unchanged.  5. New low-attenuation liver lesion could represent a metastasis.  6. Dilated fluid-filled esophagus suggesting esophageal dysmotility with  thickened wall possibly secondary to radiation therapy.  7. Atherosclerotic aorta with coronary artery calcifications. No abdominal  aortic aneurysm.    CT 08/26/2018    IMPRESSION  IMPRESSION:  ??  1. Unchanged appearance of right hemithorax as above.  2. Increased number and size of hypoattenuating lesions of the liver consistent  with worsening hepatic metastatic disease.  3. Bilateral renal pelvocaliectasis and ureteral distention, new on the right  and increased on the left.  4. Foley catheter within urinary bladder which cannot be further assessed.  5. Osteosclerotic metastatic disease.   6. Anasarca appearance demonstrated in the interval.   ??  Pathology from liver biopsy 08/17/18:   Specimen Source   1: Liver, Core biopsy with touch intepretation:   CYTOLOGIC INTERPRETATION:   1. Liver, Core biopsy with touch intepretation:   Poorly differentiated adenocarcinoma consistent with metastatic pulmonary adenocarcinoma   See comment   General Categorization   Positive for malignancy.       ASSESSMENT  Mr. Burget is a 68 y.o. male with widely metastatic prostate adenocarcinoma and lung adenocarcinoma status post right lower lobe lobectomy on 05/22/17. Most recent imaging concerning for progression with new liver mets. He is admitted for acute renal failure    PLAN    Acute renal failure  Post obstructive  CT with Bilateral renal pelvocaliectasis and ureteral distention, new on the right  and increased on the left- likely due to previously seen progressive prostate cancer possibly infiltrating the bladder  S/p PCN bilaterally   Urology and  Nephrology inputs are appreciated  Creatinine dropped to 1.6 , upto 1.9 today- likely a prerenal component from poor intake and blood loss  Transfusions and hydration per nephrology      Lower extremity edema and h/o DVT  Diagnosed 04/22/16  Cancer associated  On lifelong Eliquis 72m BID   Now with marked RLE  Venous duplex obtained in ER with Extensive DVT  Unfortunately he had major bleeding on heparin gtt  We sent him for an IVC filter but his anatomy is not conducive to this  The IVC is occluded by extrinsic compression from? D/W Dr. MDorene Grebe   It does appear that most of the clot is chronic and the anatomy is such that risk for PE is low    Risk of bleeding at the moment will not [facility]      Prostate cancer stage IV  Castrate sensitive, treatment na??ve widely metastatic prostate cancer to lymph nodes above and below the diaphragm and bones.  Prostate adenocarcinoma diagnosed on biopsy of the supraclavicular lymph  node. Discussed in tumor board and a small cell component has been ruled out.  PSA at the time of diagnosis at 2400.  ??  Currently on Lupron 45 mg every 6 months. On Zytiga 1,0026mdaily + prednisone 5 mg daily which has controlled his disease now for > 2 years  Though his PSA is still undetectable at 0.1 his prior scans especially the bladder masses suggest progressive prostate cancer and probable castrate resistance  We will hold Zytiga inpatient   Will [facility] and [facility]  R lung adenocarcinoma, [id number] stage IIIB- Parietal pleural involvement- PD-L1 99%  S/p RLL lobectomy on 05/22/17 then received PORT  Now with biopsy proven liver metastases  Repeat CT CAP pending  Plan on palliative Keytruda OP    He is scheduled to start this 09/08/2018      Bone metastases  Diffuse  Completed palliative radiation that was initiated on 06/09/16      Right sided chest wall pain  Unclear cause  No corresponding mets  Controlled    Anemia   Due to progressive cancer, renal failure, hematuria  Transfuse if < 7 g/dl       Will continue to follow   If he has a stable Hb tomorrow could be discharged with PCN in place   He is to be seen by usKoreat 2 pm on 09/08/2018  We will request our navigator to follow  D/W His POA and Dr. [facility]Jerl SantosD, [facility]  '

## 2018-09-01 NOTE — Progress Notes (Signed)
Problem: Falls - Risk of  Goal: *Absence of Falls  Description  Document [name] Fall Risk and appropriate interventions in the flowsheet.  Outcome: Progressing Towards Goal  Note:   Fall Risk Interventions:  Mobility Interventions: Patient to call before getting OOB, Communicate number of staff needed for ambulation/transfer, Strengthening exercises (ROM-active/passive)     Medication Interventions: Patient to call before getting OOB, Teach patient to arise slowly, Evaluate medications/consider consulting pharmacy    Elimination Interventions: Call light in reach, Patient to call for help with toileting needs, Toileting schedule/hourly rounds       Problem: Pressure Injury - Risk of  Goal: *Prevention of pressure injury  Description  Document Braden Scale and appropriate interventions in the flowsheet.  Outcome: Progressing Towards Goal  Note:   Pressure Injury Interventions:    Activity Interventions: Increase time out of bed, PT/OT evaluation    Mobility Interventions: HOB 30 degrees or less, Float heels, Turn and reposition approx. every two hours(pillow and wedges)    Nutrition Interventions: Document food/fluid/supplement intake, Offer support with meals,snacks and hydration    Friction and Shear Interventions: Feet elevated on foot rest, Minimize layers, HOB 30 degrees or less

## 2018-09-01 NOTE — Progress Notes (Addendum)
[facility]                                                                                          Hospitalist Progress Note  Carolee Rota, MD  Answering service: [phone] OR [phone] from in house phone        Date of Service:  09/01/2018  NAME:  Bruce Clark  DOB:  [birth year]  MRN:  [id number]      Admission Summary:   Admitted weak and debilitated for eval insetting of lung cancer and prostate cancer     Interval history / Subjective:       09/01/2018 :  No complaints.  Hematuria clearing.       Assessment & Plan:     Severe AKI due to obstructive uropathy  -S/p bilateral PCN.He will be discharged with PCN  -Renal function improved greatly  -Nephrology help appreciated     Acute on chronic anemia,acute drop due likely to hematuria that was PTA  -transfused.Monitor  -HB in AM         DVT - for heparin drip, (failed Eliquis taken up to admission)  -Off heparin due to major bleeding  -IVC could be placed due to anatomy not conducive   -Him/onc...> no anticoagulation due to major bleeding risk          Right lung adenocarcinoma  -S/p RLL lobectomy  +liver mets  Oncology following.Pallative keytruda scheduled for 09/08/18    Stage IV prostate ca with bone mets s/p XRT     Extensive LE DVT.On eliquis PTA.Severe anemia,hematuria precluding continuations of anticoagulation.    Chronic Severe Protein-Calorie Malnutrition  -dietary consult      Code status: DNR   DVT prophylaxis:scd,  Anticipate d/c tomorrow if HB stable.     Hospital Problems  Date Reviewed: 09-20-2018          Codes Class Noted POA    * (Principal) AKI (acute kidney injury) [facility]) ICD-10-CM: N17.9  ICD-9-CM: 584.9  08/26/2018 Unknown                Review of Systems:   Pertinent items are noted in HPI.  Fortunately no pain.      Vital Signs:    Last 24hrs VS reviewed since prior progress note. Most recent are:  Visit Vitals  BP 110/74 (BP 1 Location: Left arm, BP Patient Position: At rest)   Pulse (!) 103    Temp 98.6 ??F (37 ??C)   Resp 18   Wt 55.6 kg (122 lb 9.2 oz)   SpO2 100%   BMI 19.20 kg/m??         Intake/Output Summary (Last 24 hours) at 09/01/2018 1855  Last data filed at 09/01/2018 0841  Gross per 24 hour   Intake ???   Output 1470 ml   Net -1470 ml        Physical Examination:           Stable exam  Emaciated.   Constitutional:  No acute distress, weak, debilitated, and  Emaciated, appearing. Else cooperative, pleasant    ENT:  Oral mucous moist, oropharynx benign.    Resp:  CTA bilaterally. No wheezing/rhonchi/rales. No accessory muscle use   CV:  Regular rhythm, normal rate, no  gallops, rubs    GI:  Soft, non distended, non tender. normoactive bowel sounds, no hepatosplenomegaly     Musculoskeletal:  trace + rt edema > lt  edema, warm,      Neurologic:  Moves all extremities.  AAOx3, CN II-XII reviewed     Psych:  Good insight, Not anxious nor agitated.  Skin:  Good turgor, no rashes or ulcers       Data Review:    Review and/or order of clinical lab test      Labs:     Recent Labs     09/01/18  0611 08/31/18  1725 08/31/18  0615 08/31/18  0335   WBC  --   --  6.0 5.3   HGB 7.4* 8.4* 5.1* 5.1*   HCT 22.9* 25.8* 16.2* 16.2*   PLT  --   --  222 158     Recent Labs     09/01/18  0611 08/31/18  1725 08/31/18  0615 08/31/18  0336   NA 138  --  138 139   K 3.2* 3.7 3.0* 2.6*   CL 103  --  102 106   CO2 26  --  26 24   BUN 28*  --  40* 38*   CREA 1.42*  --  1.92* 1.65*   GLU 97  --  113* 101*   CA 8.7  --  8.5 7.8*   MG 1.6  --  2.2 1.3*   PHOS 2.1*  --   --  2.6     Recent Labs     09/01/18  0611 08/31/18  0615   SGOT 20 20   ALT <6* 7*   AP 66 71   TBILI 0.3 0.2   TP 7.1 6.5   ALB 2.2* 2.2*   GLOB 4.9* 4.3*     Recent Labs     08/30/18  1514 08/30/18  0937 08/30/18  0028   APTT 60.4* 58.4* 55.8*      No results for input(s): FE, TIBC, PSAT, FERR in the last 72 hours.   No results found for: FOL, RBCF   No results for input(s): PH, PCO2, PO2 in the last 72 hours.   No results for input(s): CPK, CKNDX, TROIQ in the last 72 hours.    No lab exists for component: CPKMB  Lab Results   Component Value Date/Time    Cholesterol, total 184 07/01/2016 10:50 PM    HDL Cholesterol 64 07/01/2016 10:50 PM    LDL, calculated 109.2 (H) 07/01/2016 10:50 PM    Triglyceride 54 07/01/2016 10:50 PM    CHOL/HDL Ratio 2.9 07/01/2016 10:50 PM     Lab Results   Component Value Date/Time    Glucose (POC) 103 (H) 04/28/2016 10:31 AM    Glucose (POC) 90 04/26/2016 10:33 PM    Glucose (POC) 71 04/26/2016 05:12 PM    Glucose (POC) 106 (H) 04/26/2016 12:03 PM    Glucose (POC) 117 (H) 04/25/2016 04:52 PM     Lab Results   Component Value Date/Time    Color YELLOW/STRAW 08/26/2018 02:25 PM    Appearance CLEAR 08/26/2018 02:25 PM    Specific gravity 1.012 08/26/2018 02:25 PM    Specific gravity 1.025 04/22/2016 08:22 PM    pH (UA) 5.0 08/26/2018 02:25 PM    Protein NEGATIVE  08/26/2018 02:25 PM    Glucose NEGATIVE  08/26/2018 02:25 PM    Ketone NEGATIVE  08/26/2018 02:25 PM    Bilirubin NEGATIVE  08/26/2018 02:25 PM    Urobilinogen 0.2 08/26/2018 02:25 PM    Nitrites NEGATIVE  08/26/2018 02:25 PM    Leukocyte Esterase NEGATIVE  08/26/2018 02:25 PM    Epithelial cells FEW 08/26/2018 02:25 PM    Bacteria NEGATIVE  08/26/2018 02:25 PM    WBC 0-4 08/26/2018 02:25 PM    RBC 0-5 08/26/2018 02:25 PM         Medications Reviewed:     Current Facility-Administered Medications   Medication Dose Route Frequency   ??? 0.9% sodium chloride infusion 250 mL  250 mL IntraVENous PRN   ??? potassium chloride SR (KLOR-CON 10) tablet 40 mEq  40 mEq Oral DAILY   ??? fentaNYL citrate (PF) injection 100 mcg  100 mcg IntraVENous Multiple   ??? acetaminophen (TYLENOL) tablet 650 mg  650 mg Oral Q4H PRN   ??? 0.9% sodium chloride infusion  75 mL/hr IntraVENous CONTINUOUS   ??? predniSONE (DELTASONE) tablet 5 mg  5 mg Oral DAILY   ??? senna (SENOKOT) tablet 8.6 mg  1 Tab Oral QHS   ??? sodium chloride (NS) flush 5-40 mL  5-40 mL IntraVENous Q8H    ??? sodium chloride (NS) flush 5-40 mL  5-40 mL IntraVENous PRN   ??? LORazepam (ATIVAN) tablet 0.5 mg  0.5 mg Oral BID PRN   ??? oxyCODONE IR (ROXICODONE) tablet 5-10 mg  5-10 mg Oral Q4H PRN   ??? ondansetron (ZOFRAN) injection 4 mg  4 mg IntraVENous Q4H PRN     ______________________________________________________________________  EXPECTED LENGTH OF STAY: 3d 4h  ACTUAL LENGTH OF STAY:          6                 Ciin Brazzel Sonnie Alamo, MD

## 2018-09-01 NOTE — Progress Notes (Signed)
TRANSITION OF CARE  TBD    CM reviewed case with treatment team during [place].  Patient has bilateral nephrostomy tubes. Patient continues with swollen legs and leg DVT with procedure yesterday reported unsuccessful. Patient also has lower HGB.

## 2018-09-01 NOTE — Progress Notes (Signed)
Progress Notes by Andris Baumann T, MD at 09/01/18 1855                Author: Carolee Rota, MD  Service: Internal Medicine  Author Type: Physician       Filed: 09/01/18 1907  Date of Service: 09/01/18 1855  Status: Addendum          Editor: Carolee Rota, MD (Physician)          Related Notes: Original Note by Carolee Rota, MD (Physician) filed at 09/01/18 [facility]                                                                                              Hospitalist Progress Note   Carolee Rota, MD   Answering service: [phone] OR [phone] from in house phone            Date of Service:  09/01/2018   NAME:  Bruce Clark   DOB:  1950/08/26   MRN:  [id number]           Admission Summary:        Admitted weak and debilitated for eval insetting of lung cancer and prostate cancer       Interval history / Subjective:             09/01/2018 :   No complaints.   Hematuria clearing.             Assessment & Plan:          Severe AKI due to obstructive uropathy   -S/p bilateral PCN.He will be discharged with PCN   -Renal function improved greatly   -Nephrology help appreciated       Acute on chronic anemia,acute drop due likely to hematuria that was PTA   -transfused.Monitor   -HB in AM             DVT - for heparin drip, (failed Eliquis taken up to admission)   -Off heparin due to major bleeding   -IVC could be placed due to anatomy not conducive    -Him/onc...> no anticoagulation due to major bleeding risk              Right lung adenocarcinoma   -S/p RLL lobectomy   +liver mets   Oncology following.Pallative keytruda scheduled for 09/08/18      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      Chronic Severe Protein-Calorie Malnutrition   -dietary consult         Code status: DNR    DVT prophylaxis:scd,   Anticipate d/c tomorrow if  HB stable.           Hospital Problems   Date Reviewed:  08/30/2018  Codes  Class  Noted  POA              * (Principal) AKI (acute kidney injury) [facility])  ICD-10-CM: N17.9   ICD-9-CM: 584.9    08/26/2018  Unknown                               Review of Systems:     Pertinent items are noted in HPI.  Fortunately no pain.           Vital Signs:      Last 24hrs VS reviewed since prior progress note. Most recent are:   Visit Vitals      BP  110/74 (BP 1 Location: Left arm, BP Patient Position: At rest)     Pulse  (!) 103     Temp  98.6 ??F (37 ??C)     Resp  18     Wt  55.6 kg (122 lb 9.2 oz)     SpO2  100%        BMI  19.20 kg/m??              Intake/Output Summary (Last 24 hours) at 09/01/2018 1855   Last data filed at 09/01/2018 0841     Gross per 24 hour        Intake  --        Output  1470 ml        Net  -1470 ml              Physical Examination:               Stable exam  Emaciated.       Constitutional:   No acute distress, weak, debilitated, and  Emaciated, appearing. Else cooperative, pleasant      ENT:   Oral mucous moist, oropharynx benign.      Resp:   CTA bilaterally. No wheezing/rhonchi/rales. No accessory muscle use     CV:   Regular rhythm, normal rate, no  gallops, rubs      GI:   Soft, non distended, non tender. normoactive bowel sounds, no hepatosplenomegaly       Musculoskeletal:   trace + rt edema > lt  edema, warm,           Neurologic:   Moves all extremities.  AAOx3, CN II-XII reviewed       Psych:  Good insight, Not anxious nor agitated.   Skin:  Good turgor, no rashes or ulcers               Data Review:      Review and/or order of clinical lab test           Labs:          Recent Labs              09/01/18   0611  08/31/18   1725  08/31/18   0615  08/31/18   0335     WBC   --    --   6.0  5.3     HGB  7.4*  8.4*  5.1*  5.1*     HCT  22.9*  25.8*  16.2*  16.2*           PLT   --    --   222  158          Recent Labs  09/01/18   0611  08/31/18   1725  08/31/18   0615   08/31/18   0336     NA  138   --   138  139     K  3.2*  3.7  3.0*  2.6*     CL  103   --   102  106     CO2  26   --   26  24     BUN  28*   --   40*  38*     CREA  1.42*   --   1.92*  1.65*     GLU  97   --   113*  101*     CA  8.7   --   8.5  7.8*     MG  1.6   --   2.2  1.3*           PHOS  2.1*   --    --   2.6          Recent Labs            09/01/18   0611  08/31/18   0615     SGOT  20  20     ALT  <6*  7*     AP  66  71     TBILI  0.3  0.2     TP  7.1  6.5     ALB  2.2*  2.2*         GLOB  4.9*  4.3*          Recent Labs             08/30/18   1514  08/30/18   0937  08/30/18   0028          APTT  60.4*  58.4*  55.8*         No results for input(s): FE, TIBC, PSAT, FERR in the last 72 hours.    No results found for: FOL, RBCF    No results for input(s): PH, PCO2, PO2 in the last 72 hours.   No results for input(s): CPK, CKNDX, TROIQ in the last 72 hours.      No lab exists for component: CPKMB     Lab Results         Component  Value  Date/Time            Cholesterol, total  184  07/01/2016 10:50 PM       HDL Cholesterol  64  07/01/2016 10:50 PM       LDL, calculated  109.2 (H)  07/01/2016 10:50 PM       Triglyceride  54  07/01/2016 10:50 PM            CHOL/HDL Ratio  2.9  07/01/2016 10:50 PM          Lab Results         Component  Value  Date/Time            Glucose (POC)  103 (H)  04/28/2016 10:31 AM       Glucose (POC)  90  04/26/2016 10:33 PM       Glucose (POC)  71  04/26/2016 05:12 PM       Glucose (POC)  106 (H)  04/26/2016 12:03 PM            Glucose (POC)  117 (H)  04/25/2016 04:52 PM          Lab Results         Component  Value  Date/Time            Color  YELLOW/STRAW  08/26/2018 02:25 PM       Appearance  CLEAR  08/26/2018 02:25 PM       Specific gravity  1.012  08/26/2018 02:25 PM       Specific gravity  1.025  04/22/2016 08:22 PM       pH (UA)  5.0  08/26/2018 02:25 PM       Protein  NEGATIVE   08/26/2018 02:25 PM       Glucose  NEGATIVE   08/26/2018 02:25 PM       Ketone  NEGATIVE   08/26/2018  02:25 PM       Bilirubin  NEGATIVE   08/26/2018 02:25 PM       Urobilinogen  0.2  08/26/2018 02:25 PM       Nitrites  NEGATIVE   08/26/2018 02:25 PM       Leukocyte Esterase  NEGATIVE   08/26/2018 02:25 PM       Epithelial cells  FEW  08/26/2018 02:25 PM       Bacteria  NEGATIVE   08/26/2018 02:25 PM       WBC  0-4  08/26/2018 02:25 PM            RBC  0-5  08/26/2018 02:25 PM                Medications Reviewed:          Current Facility-Administered Medications          Medication  Dose  Route  Frequency           ?  0.9% sodium chloride infusion 250 mL   250 mL  IntraVENous  PRN     ?  potassium chloride SR (KLOR-CON 10) tablet 40 mEq   40 mEq  Oral  DAILY     ?  fentaNYL citrate (PF) injection 100 mcg   100 mcg  IntraVENous  Multiple     ?  acetaminophen (TYLENOL) tablet 650 mg   650 mg  Oral  Q4H PRN     ?  0.9% sodium chloride infusion   75 mL/hr  IntraVENous  CONTINUOUS     ?  predniSONE (DELTASONE) tablet 5 mg   5 mg  Oral  DAILY     ?  senna (SENOKOT) tablet 8.6 mg   1 Tab  Oral  QHS     ?  sodium chloride (NS) flush 5-40 mL   5-40 mL  IntraVENous  Q8H     ?  sodium chloride (NS) flush 5-40 mL   5-40 mL  IntraVENous  PRN     ?  LORazepam (ATIVAN) tablet 0.5 mg   0.5 mg  Oral  BID PRN     ?  oxyCODONE IR (ROXICODONE) tablet 5-10 mg   5-10 mg  Oral  Q4H PRN           ?  ondansetron (ZOFRAN) injection 4 mg   4 mg  IntraVENous  Q4H PRN        ______________________________________________________________________   EXPECTED LENGTH OF STAY: 3d 4h   ACTUAL LENGTH OF STAY:          6  [id number] Sonnie Alamo, MD

## 2018-09-01 NOTE — Progress Notes (Signed)
Progress  Notes by Philmore Pali, MD at 09/01/18 1448                Author: Philmore Pali, MD  Service: Nephrology  Author Type: Physician       Filed: 09/01/18 1454  Date of Service: 09/01/18 1448  Status: Signed          Editor: Philmore Pali, MD (Physician)                                                                                                 NAME: Bruce Clark         DOB:  12-16-1950         MRN:  [id number]          Assessment :     Plan:      --AKI- due to obstructive uropathy. UA is bland. No other cause to explain AKI. Basln Cr normal in Nov 2019   Hyponatremia   Obstructive Uropathy- likely due to prostate cancer   Metastatic Prostate and Lung Cancer   Liver mets   HTN  --Creatinine continues to improve at 1.4mg /dl.  S/p bilateral PCN's.  Fair UO.        Intake still poor. Hopefully stop fluids by tomorrow      Oral KCl   IV Mag sulfate      Hgb better s/p PRBC yesterday      D/W RN               Subjective:        Chief Complaint:  No events overnight. No N/V  No pain.       Review of Systems:              Symptom  Y/N  Comments    Symptom  Y/N  Comments             Fever/Chills        Chest Pain                 Poor Appetite        Edema                 Cough        Abdominal Pain         Sputum        Joint Pain         SOB/DOE        Pruritis/Rash         Nausea/vomit        Tolerating PT/OT         Diarrhea        Tolerating Diet                 Constipation        Other               Could not obtain due to:            Objective:        VITALS:    Last 24hrs  VS reviewed since prior progress note. Most recent are:   Visit Vitals      BP  110/74 (BP 1 Location: Left arm, BP Patient Position: At rest)     Pulse  (!) 103     Temp  98.6 ??F (37 ??C)     Resp  18     Wt  55.6 kg (122 lb 9.2 oz)     SpO2  100%        BMI  19.20 kg/m??           Intake/Output Summary (Last 24 hours) at 09/01/2018 1448   Last data filed at 09/01/2018 0841     Gross per 24 hour        Intake  --        Output   2095 ml        Net  -2095 ml         Telemetry Reviewed:       PHYSICAL EXAM:   General: NAD   B/L PCN-> hematuria   RLE edema         Lab Data Reviewed: (see below)      Medications Reviewed: (see below)      PMH/SH reviewed - no change compared to H&P   ________________________________________________________________________   Care Plan discussed with:       Patient  Y           Family              RN  Y           Care Manager                            Consultant:                     Comments         >50% of visit spent in counseling and coordination of care            ________________________________________________________________________   Chaeli Judy Sherrye Payor, MD       Procedures: see electronic medical records for all procedures/Xrays and details which   were not copied into this note but were reviewed prior to creation of Plan.        LABS:     Recent Labs              09/01/18   0611  08/31/18   1725  08/31/18   0615  08/31/18   0335     WBC   --    --   6.0  5.3     HGB  7.4*  8.4*  5.1*  5.1*     HCT  22.9*  25.8*  16.2*  16.2*           PLT   --    --   222  158          Recent Labs              09/01/18   0611  08/31/18   1725  08/31/18   0615  08/31/18   0336     NA  138   --   138  139     K  3.2*  3.7  3.0*  2.6*     CL  103   --   102  106     CO2  26   --  26  24     BUN  28*   --   40*  38*     CREA  1.42*   --   1.92*  1.65*     GLU  97   --   113*  101*     CA  8.7   --   8.5  7.8*     MG  1.6   --   2.2  1.3*           PHOS  2.1*   --    --   2.6          Recent Labs            09/01/18   0611  08/31/18   0615     SGOT  20  20     AP  66  71     TP  7.1  6.5     ALB  2.2*  2.2*         GLOB  4.9*  4.3*          Recent Labs             08/30/18   1514  08/30/18   0937  08/30/18   0028          APTT  60.4*  58.4*  55.8*         No results for input(s): FE, TIBC, PSAT, FERR in the last 72 hours.    No results found for: FOL, RBCF    No results for input(s): PH, PCO2, PO2 in the last 72 hours.   No  results for input(s): CPK, CKMB in the last 72 hours.      No lab exists for component: TROPONINI   No components found for: [facility]     Lab Results         Component  Value  Date/Time            Color  YELLOW/STRAW  08/26/2018 02:25 PM       Appearance  CLEAR  08/26/2018 02:25 PM       Specific gravity  1.012  08/26/2018 02:25 PM       Specific gravity  1.025  04/22/2016 08:22 PM       pH (UA)  5.0  08/26/2018 02:25 PM       Protein  NEGATIVE   08/26/2018 02:25 PM       Glucose  NEGATIVE   08/26/2018 02:25 PM       Ketone  NEGATIVE   08/26/2018 02:25 PM       Bilirubin  NEGATIVE   08/26/2018 02:25 PM       Urobilinogen  0.2  08/26/2018 02:25 PM       Nitrites  NEGATIVE   08/26/2018 02:25 PM       Leukocyte Esterase  NEGATIVE   08/26/2018 02:25 PM       Epithelial cells  FEW  08/26/2018 02:25 PM       Bacteria  NEGATIVE   08/26/2018 02:25 PM       WBC  0-4  08/26/2018 02:25 PM            RBC  0-5  08/26/2018 02:25 PM           MEDICATIONS:     Current Facility-Administered Medications          Medication  Dose  Route  Frequency           ?  0.9% sodium chloride infusion 250 mL   250 mL  IntraVENous  PRN     ?  potassium chloride SR (KLOR-CON 10) tablet 40 mEq   40 mEq  Oral  DAILY     ?  fentaNYL citrate (PF) injection 100 mcg   100 mcg  IntraVENous  Multiple     ?  acetaminophen (TYLENOL) tablet 650 mg   650 mg  Oral  Q4H PRN     ?  0.9% sodium chloride infusion   75 mL/hr  IntraVENous  CONTINUOUS     ?  predniSONE (DELTASONE) tablet 5 mg   5 mg  Oral  DAILY     ?  senna (SENOKOT) tablet 8.6 mg   1 Tab  Oral  QHS     ?  sodium chloride (NS) flush 5-40 mL   5-40 mL  IntraVENous  Q8H     ?  sodium chloride (NS) flush 5-40 mL   5-40 mL  IntraVENous  PRN     ?  LORazepam (ATIVAN) tablet 0.5 mg   0.5 mg  Oral  BID PRN     ?  oxyCODONE IR (ROXICODONE) tablet 5-10 mg   5-10 mg  Oral  Q4H PRN           ?  ondansetron (ZOFRAN) injection 4 mg   4 mg  IntraVENous  Q4H PRN

## 2018-09-01 NOTE — Progress Notes (Signed)
Problem: Falls - Risk of  Goal: *Absence of Falls  Description  Document Bruce Clark Fall Risk and appropriate interventions in the flowsheet.  Outcome: Progressing Towards Goal  Note:   Fall Risk Interventions:  Mobility Interventions: Patient to call before getting OOB, Communicate number of staff needed for ambulation/transfer, Strengthening exercises (ROM-active/passive)     Medication Interventions: Patient to call before getting OOB, Teach patient to arise slowly, Evaluate medications/consider consulting pharmacy    Elimination Interventions: Call light in reach, Patient to call for help with toileting needs, Toileting schedule/hourly rounds       Problem: Pressure Injury - Risk of  Goal: *Prevention of pressure injury  Description  Document Braden Scale and appropriate interventions in the flowsheet.  Outcome: Progressing Towards Goal  Note:   Pressure Injury Interventions:    Activity Interventions: Increase time out of bed, PT/OT evaluation    Mobility Interventions: HOB 30 degrees or less, Float heels, Turn and reposition approx. every two hours(pillow and wedges)    Nutrition Interventions: Document food/fluid/supplement intake, Offer support with meals,snacks and hydration    Friction and Shear Interventions: Feet elevated on foot rest, Minimize layers, HOB 30 degrees or less

## 2018-09-01 NOTE — Progress Notes (Signed)
Physical Therapy  Orders received and chart reviewed.  Met with patient who politely declined evaluation on this date to extreme fatigue.  Noted to have received blood earlier today.  States is independent at home with occasional use of cane.  Nurse reports walks to bathroom with assist of one.  Will follow up tomorrow for evaluation.  Beatrice Lecher, PT

## 2018-09-02 LAB — CBC WITH AUTO DIFFERENTIAL
Basophils %: 0 % (ref 0–1)
Basophils Absolute: 0 10*3/uL (ref 0.0–0.1)
Eosinophils %: 3 % (ref 0–7)
Eosinophils Absolute: 0.2 10*3/uL (ref 0.0–0.4)
Granulocyte Absolute Count: 0 10*3/uL (ref 0.00–0.04)
Hematocrit: 28 % — ABNORMAL LOW (ref 36.6–50.3)
Hemoglobin: 9 g/dL — ABNORMAL LOW (ref 12.1–17.0)
Immature Granulocytes: 0 % (ref 0.0–0.5)
Lymphocytes %: 13 % (ref 12–49)
Lymphocytes Absolute: 0.9 10*3/uL (ref 0.8–3.5)
MCH: 28.1 PG (ref 26.0–34.0)
MCHC: 32.1 g/dL (ref 30.0–36.5)
MCV: 87.5 FL (ref 80.0–99.0)
MPV: 9.1 FL (ref 8.9–12.9)
Monocytes %: 11 % (ref 5–13)
Monocytes Absolute: 0.7 10*3/uL (ref 0.0–1.0)
NRBC Absolute: 0 10*3/uL (ref 0.00–0.01)
Neutrophils %: 73 % (ref 32–75)
Neutrophils Absolute: 4.8 10*3/uL (ref 1.8–8.0)
Nucleated RBCs: 0 PER 100 WBC
Platelets: 255 10*3/uL (ref 150–400)
RBC: 3.2 M/uL — ABNORMAL LOW (ref 4.10–5.70)
RDW: 13.7 % (ref 11.5–14.5)
WBC: 6.7 10*3/uL (ref 4.1–11.1)

## 2018-09-02 LAB — COMPREHENSIVE METABOLIC PANEL
ALT: 10 U/L — ABNORMAL LOW (ref 12–78)
AST: 26 U/L (ref 15–37)
Albumin/Globulin Ratio: 0.5 — ABNORMAL LOW (ref 1.1–2.2)
Albumin: 2.6 g/dL — ABNORMAL LOW (ref 3.5–5.0)
Alkaline Phosphatase: 75 U/L (ref 45–117)
Anion Gap: 6 mmol/L (ref 5–15)
BUN: 25 MG/DL — ABNORMAL HIGH (ref 6–20)
Bun/Cre Ratio: 20 (ref 12–20)
CO2: 27 mmol/L (ref 21–32)
Calcium: 8.8 MG/DL (ref 8.5–10.1)
Chloride: 102 mmol/L (ref 97–108)
Creatinine: 1.28 MG/DL (ref 0.70–1.30)
EGFR IF NonAfrican American: 56 mL/min/{1.73_m2} — ABNORMAL LOW (ref >60)
GFR African American: >60 mL/min/{1.73_m2} (ref >60)
Globulin: 4.9 g/dL — ABNORMAL HIGH (ref 2.0–4.0)
Glucose: 90 mg/dL (ref 65–100)
Potassium: 4.3 mmol/L (ref 3.5–5.1)
Sodium: 135 mmol/L — ABNORMAL LOW (ref 136–145)
Total Bilirubin: 0.3 MG/DL (ref 0.2–1.0)
Total Protein: 7.5 g/dL (ref 6.4–8.2)

## 2018-09-02 LAB — HEMOGLOBIN AND HEMATOCRIT
Hematocrit: 22.6 % — ABNORMAL LOW (ref 36.6–50.3)
Hemoglobin: 7.4 g/dL — ABNORMAL LOW (ref 12.1–17.0)

## 2018-09-02 LAB — PHOSPHORUS
Phosphorus: 2 MG/DL — ABNORMAL LOW (ref 2.6–4.7)
Phosphorus: 2 MG/DL — ABNORMAL LOW (ref 2.6–4.7)

## 2018-09-02 LAB — MAGNESIUM
Magnesium: 1.7 mg/dL (ref 1.6–2.4)
Magnesium: 1.7 mg/dL (ref 1.6–2.4)

## 2018-09-02 LAB — METABOLIC PANEL, COMPREHENSIVE
A-G Ratio: 0.5 — ABNORMAL LOW (ref 1.1–2.2)
ALT (SGPT): 10 U/L — ABNORMAL LOW (ref 12–78)
AST (SGOT): 26 U/L (ref 15–37)
Albumin: 2.6 g/dL — ABNORMAL LOW (ref 3.5–5.0)
Alk. phosphatase: 75 U/L (ref 45–117)
Anion gap: 6 mmol/L (ref 5–15)
BUN/Creatinine ratio: 20 (ref 12–20)
BUN: 25 MG/DL — ABNORMAL HIGH (ref 6–20)
Bilirubin, total: 0.3 MG/DL (ref 0.2–1.0)
CO2: 27 mmol/L (ref 21–32)
Calcium: 8.8 MG/DL (ref 8.5–10.1)
Chloride: 102 mmol/L (ref 97–108)
Creatinine: 1.28 MG/DL (ref 0.70–1.30)
GFR est AA: >60 mL/min/{1.73_m2} (ref >60)
GFR est non-AA: 56 mL/min/{1.73_m2} — ABNORMAL LOW (ref >60)
Globulin: 4.9 g/dL — ABNORMAL HIGH (ref 2.0–4.0)
Glucose: 90 mg/dL (ref 65–100)
Potassium: 4.3 mmol/L (ref 3.5–5.1)
Protein, total: 7.5 g/dL (ref 6.4–8.2)
Sodium: 135 mmol/L — ABNORMAL LOW (ref 136–145)

## 2018-09-02 LAB — CBC WITH AUTOMATED DIFF
ABS. BASOPHILS: 0 10*3/uL (ref 0.0–0.1)
ABS. EOSINOPHILS: 0.2 10*3/uL (ref 0.0–0.4)
ABS. IMM. GRANS.: 0 10*3/uL (ref 0.00–0.04)
ABS. LYMPHOCYTES: 0.9 10*3/uL (ref 0.8–3.5)
ABS. MONOCYTES: 0.7 10*3/uL (ref 0.0–1.0)
ABS. NEUTROPHILS: 4.8 10*3/uL (ref 1.8–8.0)
ABSOLUTE NRBC: 0 10*3/uL (ref 0.00–0.01)
BASOPHILS: 0 % (ref 0–1)
EOSINOPHILS: 3 % (ref 0–7)
HCT: 28 % — ABNORMAL LOW (ref 36.6–50.3)
HGB: 9 g/dL — ABNORMAL LOW (ref 12.1–17.0)
IMMATURE GRANULOCYTES: 0 % (ref 0.0–0.5)
LYMPHOCYTES: 13 % (ref 12–49)
MCH: 28.1 PG (ref 26.0–34.0)
MCHC: 32.1 g/dL (ref 30.0–36.5)
MCV: 87.5 FL (ref 80.0–99.0)
MONOCYTES: 11 % (ref 5–13)
MPV: 9.1 FL (ref 8.9–12.9)
NEUTROPHILS: 73 % (ref 32–75)
NRBC: 0 PER 100 WBC
PLATELET: 255 10*3/uL (ref 150–400)
RBC: 3.2 M/uL — ABNORMAL LOW (ref 4.10–5.70)
RDW: 13.7 % (ref 11.5–14.5)
WBC: 6.7 10*3/uL (ref 4.1–11.1)

## 2018-09-02 LAB — HGB & HCT
HCT: 22.6 % — ABNORMAL LOW (ref 36.6–50.3)
HGB: 7.4 g/dL — ABNORMAL LOW (ref 12.1–17.0)

## 2018-09-02 LAB — SAMPLES BEING HELD

## 2018-09-02 MED ORDER — LACTATED RINGERS IV
INTRAVENOUS | Status: DC
Start: 2018-09-02 — End: 2018-09-03
  Administered 2018-09-02 – 2018-09-03 (×2): via INTRAVENOUS

## 2018-09-02 MED FILL — OXYCODONE 5 MG TAB: 5 mg | ORAL | Qty: 2

## 2018-09-02 MED FILL — NORMAL SALINE FLUSH 0.9 % INJECTION SYRINGE: INTRAMUSCULAR | Qty: 10

## 2018-09-02 MED FILL — K-TAB 10 MEQ TABLET,EXTENDED RELEASE: 10 mEq | ORAL | Qty: 4

## 2018-09-02 MED FILL — PREDNISONE 5 MG TAB: 5 mg | ORAL | Qty: 1

## 2018-09-02 MED FILL — LACTATED RINGERS IV: INTRAVENOUS | Qty: 1000

## 2018-09-02 MED FILL — SENNA LAX 8.6 MG TABLET: 8.6 mg | ORAL | Qty: 1

## 2018-09-02 NOTE — Progress Notes (Signed)
error 

## 2018-09-02 NOTE — Progress Notes (Signed)
Error

## 2018-09-02 NOTE — Progress Notes (Signed)
Problem: Patient Education: Go to Patient Education Activity  Goal: Patient/Family Education  Outcome: Progressing Towards Goal     Problem: General Medical Care Plan  Goal: *Skin integrity maintained  Outcome: Progressing Towards Goal  Goal: *Progressive mobility and function (eg: ADL's)  Outcome: Progressing Towards Goal     Problem: Falls - Risk of  Goal: *Absence of Falls  Description  Document Bruce Clark Fall Risk and appropriate interventions in the flowsheet.  Outcome: Progressing Towards Goal  Note: Fall Risk Interventions:  Mobility Interventions: Communicate number of staff needed for ambulation/transfer, Patient to call before getting OOB, Strengthening exercises (ROM-active/passive), Utilize walker, cane, or other assistive device, Utilize gait belt for transfers/ambulation         Medication Interventions: Evaluate medications/consider consulting pharmacy, Patient to call before getting OOB, Teach patient to arise slowly    Elimination Interventions: Call light in reach, Patient to call for help with toileting needs, Toilet paper/wipes in reach              Problem: Patient Education: Go to Patient Education Activity  Goal: Patient/Family Education  Outcome: Progressing Towards Goal     Problem: Pressure Injury - Risk of  Goal: *Prevention of pressure injury  Description  Document Braden Scale and appropriate interventions in the flowsheet.  Outcome: Progressing Towards Goal  Note: Pressure Injury Interventions:            Activity Interventions: Increase time out of bed    Mobility Interventions: Float heels    Nutrition Interventions: Document food/fluid/supplement intake, Offer support with meals,snacks and hydration    Friction and Shear Interventions: Feet elevated on foot rest, Lift sheet, Minimize layers                Problem: Patient Education: Go to Patient Education Activity  Goal: Patient/Family Education  Outcome: Progressing Towards Goal     Problem: Patient Education: Go to Patient  Education Activity  Goal: Patient/Family Education  Outcome: Progressing Towards Goal     Problem: General Infection Care Plan (Adult and Pediatric)  Goal: Improvement in signs and symptoms of infection  Outcome: Progressing Towards Goal

## 2018-09-02 NOTE — Progress Notes (Signed)
NUTRITION COMPLETE ASSESSMENT    RECOMMENDATIONS:   1. RD to change ONS to Ensure Enlive and liberalize diet to promote PO intake  2. Replete Phos    Patient meets criteria for severe Protein Calorie Malnutrition as evidenced by:   ASPEN Malnutrition Criteria  Acute Illness, Chronic Illness, or Social/Enviornmental: Chronic illness  Weight Loss: Greater than 10% x 6 mos  Body Fat Loss: Severe  Muscle Mass Loss: Severe  Fluid Accumulation: Severe  ASPEN Malnutrition Score - Chronic Illness: 24  Chronic Illness - Malnutrition Diagnosis: Severe malnutrition.         Interventions/Plan:   Food/Nutrient Delivery:  Modify diet/texture/consistency/nutrients Commercial supplement          Assessment:   Reason for Assessment: LOS - at nutrition risk      Diet: cardiac  Supplements: Nepro TID with meals  Meal Intake:   Patient Vitals for the past 100 hrs:   % Diet Eaten   09/02/18 0839 25 %   08/29/18 1726 0 %   08/29/18 1314 0 %          Subjective:  UBW 147 lb.  Appetite is getting better.  I'll gain the weight back in no time.  Like Ensure.  Drink this or Boost at home.  Family bringing me in some from home.    Objective:  68 y.o. male admitted with AKI (acute kidney injury) ([place]) [N17.9]    Pt has a past medical history of Lung cancer ([place]), Prostate cancer ([place]), and Stroke ([place]).    Pt is very cachectic.  Hx of significant wt loss within past year (25 lb, 17% BW).  Followed by OP oncology dietitian.  All weights are bedweights - admitted at 145 lb, however all drains and bed cleared on 1/11 at which time pt was 122 lb at re-weigh (and this appears to be more accurate).    Diet currently cardiac with Nepro supplement which pt dislikes.  Neither are indicated by labs.  Will liberalize diet and change supplement to Ensure Enlive TID (1050 kcal, 60 gm pro).  Pt encouraged to continue supplements at home to promote weight gain.    Wt Readings from Last 20 Encounters:   09/02/18 55.6 kg (122 lb 9.2 oz)    08/26/18 66 kg (145 lb 9.6 oz)   08/17/18 68 kg (150 lb)   07/09/18 64.7 kg (142 lb 11.2 oz)   05/11/18 62.1 kg (137 lb)   03/24/18 63.9 kg (140 lb 12.8 oz)   02/17/18 64.3 kg (141 lb 12.8 oz)   01/21/18 62.6 kg (138 lb)   11/25/17 59.9 kg (132 lb)   11/04/17 64 kg (141 lb)   10/30/17 61 kg (134 lb 6.4 oz)   10/21/17 61.2 kg (135 lb)   10/06/17 63.2 kg (139 lb 6.4 oz)   09/08/17 65.3 kg (144 lb)   09/08/17 65.4 kg (144 lb 3.2 oz)   08/25/17 65.7 kg (144 lb 12.8 oz)   07/29/17 66.7 kg (147 lb)   07/24/17 67.8 kg (149 lb 6.4 oz)   07/03/17 67.5 kg (148 lb 12.8 oz)   06/08/17 70.8 kg (156 lb)         Nutritionally Significant Medications:   Prednisone, senokot      Intake/Output:    Intake/Output Summary (Last 24 hours) at 09/02/2018 1659  Last data filed at 09/02/2018 1408  Gross per 24 hour   Intake ???   Output 1575 ml   Net -1575 ml  Last BM: 1/16    Physical Findings:    Nutrition focused physical exam:   Muscle Loss: Temple Region Muscle Wasting-Severe  Clavicle Region Muscle Wasting-Severe  Shoulder Region Muscle Wasting-Severe  Hand Region Muscle Wasting-Severe  Scapula Region Muscle Wasting-Severe    Fat Loss: Orbital Region Fat Loss-Severe  Cheek Region (Buccal Fat Pads) Fat Loss-Severe  Upper Arm Region (Triceps) Fat Loss-Severe  Midaxillary Line at Iliac Crest/Ribs Fat Loss-Severe      Edema:  LLE: 1+  RLE: 2+, pitting    Abdomen: WDL    Skin Integrity: intact        Anthropometrics:  Weight Loss Metrics 09/02/2018 08/26/2018 08/26/2018 08/26/2018 08/17/2018 07/09/2018 05/11/2018   Today's Wt 122 lb 9.2 oz - 145 lb 9.6 oz - 150 lb 142 lb 11.2 oz 137 lb   BMI - 19.2 kg/m2 - 22.8 kg/m2 23.49 kg/m2 22.35 kg/m2 21.46 kg/m2      Weight Source: Bed  Height: _0  (170.2 cm),    Body mass index is 19.2 kg/m??.     IBW : 67.1 kg (148 lb), % IBW (Calculated): 82.82 %  Usual Body Weight: 66.7 kg (147 lb),      Labs:      Recent Labs     09/02/18  0612 09/01/18  0611 08/31/18  1725 08/31/18  0615 08/31/18  0336    GLU 90 97  --  113* 101*   BUN 25* 28*  --  40* 38*   CREA 1.28 1.42*  --  1.92* 1.65*   NA 135* 138  --  138 139   K 4.3 3.2* 3.7 3.0* 2.6*   CL 102 103  --  102 106   CO2 27 26  --  26 24   CA 8.8 8.7  --  8.5 7.8*   PHOS 2.0* 2.1*  --   --  2.6   MG 1.7 1.6  --  2.2 1.3*       Recent Labs     09/02/18  0612 09/01/18  0611 08/31/18  0615   SGOT _1 ALT 10* <6* 7*   AP 75 66 71   TBILI 0.3 0.3 0.2   TP 7.5 7.1 6.5   ALB 2.6* 2.2* 2.2*   GLOB 4.9* 4.9* 4.3*       No results for input(s): LAC in the last 72 hours.    Recent Labs     09/02/18  0612 09/01/18  1924  08/31/18  0615   WBC 6.7  --   --  6.0   HGB 9.0* 7.4*   < > 5.1*   HCT 28.0* 22.6*   < > 16.2*   PLT 255  --   --  222    < > = values in this interval not displayed.       No results for input(s): PREALB in the last 72 hours.    No results for input(s): TRIGL in the last 72 hours.    No results for input(s): GLUCPOC in the last 72 hours.    No results found for: HBA1C, HGBE8, HBA1CPOC, HBA1CEXT, HBA1CEXT    Cultural, religious and ethnic food preferences identified: None  Food Allergies: NKFA  Diet Restrictions: Cultural/Religious Preference(s): None           Pre-Hospitalization:  Usual Appetite: Poor    Current Hospitalization:   Fluid Restriction:    Appetite: Fair  PO Ability: Independent Average po intake:   Average  supplements intake:         Estimated Nutrition Needs:   Kcals/day: 1950 Kcals/day  Protein: 90 g(1.6 gm/kg)  Fluid: 1950 ml(1 mL/kcal)  Based On: Kcal/kg - specify (Comment)(35+ kcal/kg)  Weight Used: Actual wt(55.6 kg)    Pt expected to meet estimated nutrient needs:  Unable to predict at this time  Nutrition Diagnosis:   1. Malnutrition related to disease process as evidenced by severe muscle wasting and SQ fat loss, wt loss of 25 lb (17% BW) earlier this year, poor PO intake     2.   related to   as evidenced by      3.   related to   as evidenced by      Goals:      pt will consume >/=25% of meals and 2-3 supplements daily in next 5-7 days     Monitoring & Evaluation:   Total energy intake, Oral fluids amount, Protein intake, Liquid meal replacement  Weight/weight change, Electrolyte and renal profile       Previous Nutrition Goals Met:   N/A  Previous Recommendations:    N/A    Education & Discharge Needs:   _0  None Identified   _1  Identified and addressed    _2  Participated in care plan, discharge planning, and/or interdisciplinary rounds            Jae Dire, RD

## 2018-09-02 NOTE — Progress Notes (Signed)
OCCUPATIONAL THERAPY EVALUATION/DISCHARGE  Patient: Bruce Clark (68 y.o. male)  Date: 09/02/2018  Primary Diagnosis: AKI (acute kidney injury) ([place]) [N17.9]       Precautions: fall       ASSESSMENT  Based on the objective data described below, the patient presents near functional baseline.  Noted patient with extensive RLE DVT but with low risk for PE, not a candidate for anticoagulation, and cleared for mobility. BUE AROM/ strength/ coordination WFL, and patient can tailor sit with each LE, with good UB and LB access for ADLs.  Patient mobilized to bathroom with modified independence with stable BP in standing.  Patient was receptive to education on fall prevention and provided with handout for reinforcement.  Patient has no concerns for completing daily activities at home and does not require additional OT at this time.    Current Level of Function (ADLs/self-care): modified independent    Functional Outcome Measure: The patient scored 90/100 on the Barthel Index outcome measure which is indicative of ~10% impairment in functional ADL performance.       PLAN :    Recommendation for discharge: (in order for the patient to meet his/her long term goals)  No skilled occupational therapy/ follow up rehabilitation needs identified at this time.    This discharge recommendation:  Has not yet been discussed the attending provider and/or case management       SUBJECTIVE:   Patient stated ???I'm doing okay.???    OBJECTIVE DATA SUMMARY:   HISTORY:   Past Medical History:   Diagnosis Date   ??? Lung cancer ([place])    ??? Prostate cancer ([place])    ??? Stroke ([place])     PT STATES, "I HAD A STROKE A LONG TIME AGO"     Past Surgical History:   Procedure Laterality Date   ??? CHEST SURGERY PROCEDURE UNLISTED  04/27/2017    BRONCHOSCOPY/MEDIASTINOSCOPY   ??? HX GI      HERNIA REPAIR   ??? HX ORTHOPAEDIC  1994    ROD IN RIGHT LEG   ??? IR NEPHROSTOMY PERC LT PLC CATH  SI  08/27/2018   ??? IR NEPHROSTOMY PERC RT PLC CATH  SI  08/27/2018        Prior Level of Function/Environment/Context: independent, occasionally using SPC, reports no falls  Expanded or extensive additional review of patient history:   Home Situation  Home Environment: Private residence  # Steps to Enter: 8  Rails to Enter: Yes  One/Two Story Residence: One story  Living Alone: No  Support Systems: Friends \\ neighbors  Patient Expects to be Discharged to:: Private residence  Current DME Used/Available at Home: Cane, straight     EXAMINATION OF PERFORMANCE DEFICITS:  Cognitive/Behavioral Status:  Neurologic State: Alert     Cognition: Follows commands;Appropriate decision making;Appropriate for age attention/concentration;Appropriate safety awareness  Perception: Appears intact  Perseveration: No perseveration noted  Safety/Judgement: Awareness of environment;Insight into deficits    Skin: visible skin appears intact    Edema: none noted    Hearing:  Auditory  Auditory Impairment: None    Vision/Perceptual:    Tracking: Able to track stimulus in all quadrants w/o difficulty                      Acuity: Within Defined Limits         Range of Motion:    AROM: Within functional limits  Strength:    Strength: Within functional limits                Coordination:  Coordination: Within functional limits  Fine Motor Skills-Upper: Left Intact;Right Intact    Gross Motor Skills-Upper: Left Intact;Right Intact    Tone & Sensation:    Tone: Normal  Sensation: Intact                      Balance:  Sitting: Intact  Standing: Impaired;Without support  Standing - Static: Good  Standing - Dynamic : Good    Functional Mobility and Transfers for ADLs:  Bed Mobility:  Supine to Sit: Modified independent  Scooting: Modified independent    Transfers:  Sit to Stand: Modified independent  Stand to Sit: Modified independent    ADL Assessment:  Feeding: Independent(inferred)    Oral Facial Hygiene/Grooming: Modified Independent(inferred)    Bathing: Modified independent(inferred)     Upper Body Dressing: Modified independent(inferred)    Lower Body Dressing: Modified independent(inferred, practiced tailor sit with each LE)    Toileting: Modified independent(inferred)                ADL Intervention and task modifications:          Cognitive Retraining  Safety/Judgement: Awareness of environment;Insight into deficits      Functional Measure:  Barthel Index:    Bathing: 5  Bladder: 10  Bowels: 10  Grooming: 5  Dressing: 10  Feeding: 10  Mobility: 10  Stairs: 5  Toilet Use: 10  Transfer (Bed to Chair and Back): 15  Total: 90/100        The Barthel ADL Index: Guidelines  1. The index should be used as a record of what a patient does, not as a record of what a patient could do.  2. The main aim is to establish degree of independence from any help, physical or verbal, however minor and for whatever reason.  3. The need for supervision renders the patient not independent.  4. A patient's performance should be established using the best available evidence. Asking the patient, friends/relatives and nurses are the usual sources, but direct observation and common sense are also important. However direct testing is not needed.  5. Usually the patient's performance over the preceding 24-48 hours is important, but occasionally longer periods will be relevant.  6. Middle categories imply that the patient supplies over 50 per cent of the effort.  7. Use of aids to be independent is allowed.    Daneen Schick., Barthel, D.W. [phone]). Functional evaluation: the Barthel Index. [place] (14)2.  Lucianne Lei der [place], J.J.M.F, [place], Diona Browner., Oris Drone., [place], [place] (1999). Measuring the change indisability after inpatient rehabilitation; comparison of the responsiveness of the Barthel Index and Functional Independence Measure. Journal of Neurology, Neurosurgery, and Psychiatry, 66(4), [id number].  Wilford Sports, N.J.A, [place],  W.J.M, & [place], M.A. (2004.)  Assessment of post-stroke quality of life in cost-effectiveness studies: The usefulness of the Barthel Index and the EuroQoL-5D. Quality of Life Research, 105, 427-43         Occupational Therapy Evaluation Charge Determination   History Examination Decision-Making   LOW Complexity : Brief history review  LOW Complexity : 1-3 performance deficits relating to physical, cognitive , or psychosocial skils that result in activity limitations and / or participation restrictions  MEDIUM Complexity : Patient may present with comorbidities that affect occupational performnce. Miniml to moderate modification of tasks or assistance (eg,  physical or verbal ) with assesment(s) is necessary to enable patient to complete evaluation       Based on the above components, the patient evaluation is determined to be of the following complexity level: LOW   Pain Rating:  Patient did not indicate pain    Activity Tolerance:   Sitting EOB: BP 113/80, HR 95  Standing: BP 112/80, HR 95    After treatment patient left in no apparent distress:    Supine in bed and Call bell within reach    COMMUNICATION/EDUCATION:   The patient???s plan of care was discussed with: Physical Therapist and Registered Nurse.    Thank you for this referral.  Bailey Mech, OT  Time Calculation: 16 mins

## 2018-09-02 NOTE — Progress Notes (Signed)
[facility]                                                                                          Hospitalist Progress Note  Carolee Rota, MD  Answering service: [phone] OR [phone] from in house phone        Date of Service:  09/02/2018  NAME:  Bruce Clark  DOB:  09-23-1950  MRN:  [id number]      Admission Summary:   Admitted weak and debilitated for eval insetting of lung cancer and prostate cancer     Interval history / Subjective:       09/02/2018 :  He worked with PT,he was orthostatic  No sob ,chest pain or palpitation now back in bed.       Assessment & Plan:     Orthostatic hypotension and tachycardia due to acute debility?some degree of hypovolemia  -RL 100 ml/hr  -Pt reassessment tomorrow       Severe AKI due to obstructive uropathy  -S/p bilateral PCN.He will be discharged with PCN  -Renal function improved greatly  -Nephrology help appreciated     Acute on chronic anemia,acute drop due likely to hematuria that was PTA  -transfused.Monitor.HB 9.0      DVT - for heparin drip, (failed Eliquis taken up to admission)  -Off heparin due to major bleeding  -IVC could be placed due to anatomy not conducive   -Hem/onc advised...> no anticoagulation due to major bleeding risk and PE risk thought to be low            Right lung adenocarcinoma  -S/p RLL lobectomy  +liver mets  Oncology following.Pallative keytruda scheduled for 09/08/18    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    Chronic Severe Protein-Calorie Malnutrition  -dietary consult      Code status: DNR   DVT prophylaxis:scd,  Anticipate d/c tomorrow if HB stable.     Hospital Problems  Date Reviewed: 09/18/2018          Codes Class Noted POA    * (Principal) AKI (acute kidney injury) [facility]) ICD-10-CM: N17.9  ICD-9-CM: 584.9  08/26/2018 Unknown                Review of Systems:    Pertinent items are noted in HPI.  Fortunately no pain.      Vital Signs:    Last 24hrs VS reviewed since prior progress note. Most recent are:  Visit Vitals  BP 128/73   Pulse (!) 116   Temp 96.4 ??F (35.8 ??C)   Resp 16   Wt 55.6 kg (122 lb 9.2 oz)   SpO2 100%   BMI 19.20 kg/m??         Intake/Output Summary (Last 24 hours) at 09/02/2018 1301  Last data filed at 09/02/2018 0553  Gross per 24 hour   Intake ???   Output 1225 ml   Net -1225 ml        Physical Examination:  Stable exam  Emaciated.   Constitutional:  No acute distress, weak, debilitated, and  Emaciated, appearing. Else cooperative, pleasant    ENT:  Oral mucous moist, oropharynx benign.    Resp:  CTA bilaterally. No wheezing/rhonchi/rales. No accessory muscle use   CV:  Regular rhythm, normal rate, no  gallops, rubs    GI:  Soft, non distended, non tender. normoactive bowel sounds, no hepatosplenomegaly   bilateral PCN tubes in place.    Musculoskeletal:  +edema.Right leg swelling >>left due to extensive DVT    Neurologic:  Moves all extremities.  AAOx3, CN II-XII reviewed     Psych:  Good insight, Not anxious nor agitated.  Skin:  Good turgor, no rashes or ulcers       Data Review:    Review and/or order of clinical lab test      Labs:     Recent Labs     09/02/18  0612 09/01/18  1924  08/31/18  0615   WBC 6.7  --   --  6.0   HGB 9.0* 7.4*   < > 5.1*   HCT 28.0* 22.6*   < > 16.2*   PLT 255  --   --  222    < > = values in this interval not displayed.     Recent Labs     09/02/18  0612 09/01/18  0611 08/31/18  1725 08/31/18  0615 08/31/18  0336   NA 135* 138  --  138 139   K 4.3 3.2* 3.7 3.0* 2.6*   CL 102 103  --  102 106   CO2 27 26  --  26 24   BUN 25* 28*  --  40* 38*   CREA 1.28 1.42*  --  1.92* 1.65*   GLU 90 97  --  113* 101*   CA 8.8 8.7  --  8.5 7.8*   MG 1.7 1.6  --  2.2 1.3*   PHOS 2.0* 2.1*  --   --  2.6     Recent Labs     09/02/18  0612 09/01/18  0611 08/31/18  0615   SGOT 26 20 20    ALT 10* <6* 7*   AP 75 66 71   TBILI 0.3 0.3 0.2    TP 7.5 7.1 6.5   ALB 2.6* 2.2* 2.2*   GLOB 4.9* 4.9* 4.3*     Recent Labs     08/30/18  1514   APTT 60.4*      No results for input(s): FE, TIBC, PSAT, FERR in the last 72 hours.   No results found for: FOL, RBCF   No results for input(s): PH, PCO2, PO2 in the last 72 hours.  No results for input(s): CPK, CKNDX, TROIQ in the last 72 hours.    No lab exists for component: CPKMB  Lab Results   Component Value Date/Time    Cholesterol, total 184 07/01/2016 10:50 PM    HDL Cholesterol 64 07/01/2016 10:50 PM    LDL, calculated 109.2 (H) 07/01/2016 10:50 PM    Triglyceride 54 07/01/2016 10:50 PM    CHOL/HDL Ratio 2.9 07/01/2016 10:50 PM     Lab Results   Component Value Date/Time    Glucose (POC) 103 (H) 04/28/2016 10:31 AM    Glucose (POC) 90 04/26/2016 10:33 PM    Glucose (POC) 71 04/26/2016 05:12 PM    Glucose (POC) 106 (H) 04/26/2016 12:03 PM    Glucose (POC) 117 (H) 04/25/2016 04:52 PM  Lab Results   Component Value Date/Time    Color YELLOW/STRAW 08/26/2018 02:25 PM    Appearance CLEAR 08/26/2018 02:25 PM    Specific gravity 1.012 08/26/2018 02:25 PM    Specific gravity 1.025 04/22/2016 08:22 PM    pH (UA) 5.0 08/26/2018 02:25 PM    Protein NEGATIVE  08/26/2018 02:25 PM    Glucose NEGATIVE  08/26/2018 02:25 PM    Ketone NEGATIVE  08/26/2018 02:25 PM    Bilirubin NEGATIVE  08/26/2018 02:25 PM    Urobilinogen 0.2 08/26/2018 02:25 PM    Nitrites NEGATIVE  08/26/2018 02:25 PM    Leukocyte Esterase NEGATIVE  08/26/2018 02:25 PM    Epithelial cells FEW 08/26/2018 02:25 PM    Bacteria NEGATIVE  08/26/2018 02:25 PM    WBC 0-4 08/26/2018 02:25 PM    RBC 0-5 08/26/2018 02:25 PM         Medications Reviewed:     Current Facility-Administered Medications   Medication Dose Route Frequency   ??? lactated Ringers infusion  100 mL/hr IntraVENous CONTINUOUS   ??? potassium chloride SR (KLOR-CON 10) tablet 40 mEq  40 mEq Oral BID   ??? 0.9% sodium chloride infusion 250 mL  250 mL IntraVENous PRN    ??? fentaNYL citrate (PF) injection 100 mcg  100 mcg IntraVENous Multiple   ??? acetaminophen (TYLENOL) tablet 650 mg  650 mg Oral Q4H PRN   ??? predniSONE (DELTASONE) tablet 5 mg  5 mg Oral DAILY   ??? senna (SENOKOT) tablet 8.6 mg  1 Tab Oral QHS   ??? sodium chloride (NS) flush 5-40 mL  5-40 mL IntraVENous Q8H   ??? sodium chloride (NS) flush 5-40 mL  5-40 mL IntraVENous PRN   ??? LORazepam (ATIVAN) tablet 0.5 mg  0.5 mg Oral BID PRN   ??? oxyCODONE IR (ROXICODONE) tablet 5-10 mg  5-10 mg Oral Q4H PRN   ??? ondansetron (ZOFRAN) injection 4 mg  4 mg IntraVENous Q4H PRN     ______________________________________________________________________  EXPECTED LENGTH OF STAY: 3d 4h  ACTUAL LENGTH OF STAY:          7                 Mumtaz Lovins Sonnie Alamo, MD

## 2018-09-02 NOTE — Progress Notes (Signed)
Problem: Mobility Impaired (Adult and Pediatric)  Goal: *Acute Goals and Plan of Care (Insert Text)  Description  FUNCTIONAL STATUS PRIOR TO ADMISSION: Patient was modified independent using a single point cane for functional mobility.    HOME SUPPORT PRIOR TO ADMISSION: The patient lived with family but did not require assist.    Physical Therapy Goals  Initiated 09/02/2018  1.  Patient will transfer from bed to chair and chair to bed with modified independence using the least restrictive device within 7 day(s).  2.  Patient will ambulate with modified independence for 200 feet and stable BP and HR with the least restrictive device within 7 day(s).      Outcome: Progressing Towards Goal   PHYSICAL THERAPY EVALUATION  Patient: Bruce Clark (68 y.o. male)  Date: 09/02/2018  Primary Diagnosis: AKI (acute kidney injury) ([place]) [N17.9]        Precautions:          ASSESSMENT  Based on the objective data described below, the patient presents with close to baseline mobility of modified independence.  Today able to ambulate in hall with light hand hold assist to simulate cane and then just supervision.  Assessed on steps as well and managed well.  Patient is limited today by orthostatic hypotension as well as elevated heart rate.  Both recovered with seated rest.  Anticipate home with home health.  Would benefit from several walks during the day with staff assist as feel above issues due to decreased activity level..      Functional Outcome Measure:  The patient scored Total Score: 22/28 on the Tinetti outcome measure which is indicative of moderate fall risk.         Patient will benefit from skilled therapy intervention to address the above noted impairments.       PLAN :  Recommendations and Planned Interventions: transfer training and gait training      Frequency/Duration: Patient will be followed by physical therapy:  3 times a week to address goals.     Recommendation for discharge: (in order for the patient to meet his/her long term goals)  Physical therapy at least 2 days/week in the home     This discharge recommendation:  Has been made in collaboration with the attending provider and/or case management    IF patient discharges home will need the following DME: none         SUBJECTIVE:   Patient stated ???I need to do this more.???    OBJECTIVE DATA SUMMARY:   HISTORY:    Past Medical History:   Diagnosis Date    Lung cancer ([place])     Prostate cancer ([place])     Stroke ([place])     PT STATES, "I HAD A STROKE A LONG TIME AGO"     Past Surgical History:   Procedure Laterality Date    CHEST SURGERY PROCEDURE UNLISTED  04/27/2017    BRONCHOSCOPY/MEDIASTINOSCOPY    HX GI      HERNIA REPAIR    HX ORTHOPAEDIC  1994    ROD IN RIGHT LEG    IR NEPHROSTOMY PERC LT PLC CATH  SI  08/27/2018    IR NEPHROSTOMY PERC RT PLC CATH  SI  08/27/2018       Personal factors and/or comorbidities impacting plan of care:     Home Situation  Home Environment: Private residence  # Steps to Enter: 8  Rails to Enter: Yes  One/Two Story Residence: One story  Living Alone:  No  Support Systems: Friends \\ neighbors  Patient Expects to be Discharged to:: Private residence  Current DME Used/Available at Home: Kasandra Knudsen, straight    EXAMINATION/PRESENTATION/DECISION MAKING:   Critical Behavior:  Neurologic State: Alert           Hearing:  Auditory  Auditory Impairment: None  Edema: RLE  Range Of Motion:  AROM: Within functional limits                       Strength:    Strength: Within functional limits         Tone & Sensation:          Sensation: Intact               Coordination:  Coordination: Within functional limits  Functional Mobility:  Bed Mobility:     Supine to Sit: Modified independent     Scooting: Modified independent  Transfers:  Sit to Stand: Modified independent  Stand to Sit: Modified independent                       Balance:   Sitting: Intact  Standing: Impaired;Without support   Standing - Static: Good  Standing - Dynamic : Good  Ambulation/Gait Training:  Distance (ft): 120 Feet (ft)  Assistive Device: Gait belt;Other (comment)(hand hold support to none)  Ambulation - Level of Assistance: Stand-by assistance     Gait Description (WDL): Exceptions to WDL  Gait Abnormalities: Decreased step clearance              Speed/Cadence: Slow               Stairs:  Number of Stairs Trained: 8  Stairs - Level of Assistance: Contact guard assistance   Rail Use: Right   Functional Measure:  Tinetti test:    Sitting Balance: 1  Arises: 1  Attempts to Rise: 2  Immediate Standing Balance: 2  Standing Balance: 1  Nudged: 2  Eyes Closed: 1  Turn 360 Degrees - Continuous/Discontinuous: 1  Turn 360 Degrees - Steady/Unsteady: 1  Sitting Down: 1  Balance Score: 13 Balance total score  Indication of Gait: 1  R Step Length/Height: 1  L Step Length/Height: 1  R Foot Clearance: 1  L Foot Clearance: 1  Step Symmetry: 1  Step Continuity: 1  Path: 1  Trunk: 1  Walking Time: 0  Gait Score: 9 Gait total score  Total Score: 22/28 Overall total score         Tinetti Tool Score Risk of Falls  <19 = High Fall Risk  19-24 = Moderate Fall Risk  25-28 = Low Fall Risk  Tinetti ME. Performance-Oriented Assessment of Mobility Problems in Elderly Patients. [place]; [id number]. (Scoring Description: PT Bulletin Feb. 10, 1993)    Older adults: Letta Moynahan et al, 2009; n = [place] elderly evaluated with ABC, POMA, ADL, and IADL)  ?? Mean POMA score for males aged 62-79 years = 26.21(3.40)  ?? Mean POMA score for females age 26-79 years = 25.16(4.30)  ?? Mean POMA score for males over 80 years = 23.29(6.02)  ?? Mean POMA score for females over 80 years = 17.20(8.32)        Physical Therapy Evaluation Charge Determination   History Examination Presentation Decision-Making   HIGH Complexity :3+ comorbidities / personal factors will impact the outcome/ POC  LOW Complexity : 1-2 Standardized tests and measures  addressing body structure, function, activity limitation and /  or participation in recreation  LOW Complexity : Stable, uncomplicated        Based on the above components, the patient evaluation is determined to be of the following complexity level: LOW       Activity Tolerance:   Fair and signs and symptoms of orthostatic hypotension  Please refer to the flowsheet for vital signs taken during this treatment.    After treatment patient left in no apparent distress:   Call bell within reach and Side rails x 3    COMMUNICATION/EDUCATION:   The patient???s plan of care was discussed with: Registered Nurse and Education officer, museum.    Fall prevention education was provided and the patient/caregiver indicated understanding., Patient/family have participated as able in goal setting and plan of care., and Patient/family agree to work toward stated goals and plan of care.    Thank you for this referral.  Beatrice Lecher, PT   Time Calculation: (P) 23 mins

## 2018-09-02 NOTE — Progress Notes (Signed)
Problem: Patient Education: Go to Patient Education Activity  Goal: Patient/Family Education  Outcome: Progressing Towards Goal     Problem: General Medical Care Plan  Goal: *Skin integrity maintained  Outcome: Progressing Towards Goal  Goal: *Progressive mobility and function (eg: ADL's)  Outcome: Progressing Towards Goal     Problem: Falls - Risk of  Goal: *Absence of Falls  Description  Document [name] Fall Risk and appropriate interventions in the flowsheet.  Outcome: Progressing Towards Goal  Note: Fall Risk Interventions:  Mobility Interventions: Communicate number of staff needed for ambulation/transfer, Patient to call before getting OOB, Strengthening exercises (ROM-active/passive), Utilize walker, cane, or other assistive device, Utilize gait belt for transfers/ambulation         Medication Interventions: Evaluate medications/consider consulting pharmacy, Patient to call before getting OOB, Teach patient to arise slowly    Elimination Interventions: Call light in reach, Patient to call for help with toileting needs, Toilet paper/wipes in reach              Problem: Patient Education: Go to Patient Education Activity  Goal: Patient/Family Education  Outcome: Progressing Towards Goal     Problem: Pressure Injury - Risk of  Goal: *Prevention of pressure injury  Description  Document Braden Scale and appropriate interventions in the flowsheet.  Outcome: Progressing Towards Goal  Note: Pressure Injury Interventions:            Activity Interventions: Increase time out of bed    Mobility Interventions: Float heels    Nutrition Interventions: Document food/fluid/supplement intake, Offer support with meals,snacks and hydration    Friction and Shear Interventions: Feet elevated on foot rest, Lift sheet, Minimize layers                Problem: Patient Education: Go to Patient Education Activity  Goal: Patient/Family Education  Outcome: Progressing Towards Goal      Problem: Patient Education: Go to Patient Education Activity  Goal: Patient/Family Education  Outcome: Progressing Towards Goal     Problem: General Infection Care Plan (Adult and Pediatric)  Goal: Improvement in signs and symptoms of infection  Outcome: Progressing Towards Goal

## 2018-09-02 NOTE — Progress Notes (Addendum)
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 is a 68 y.o. male with stage IV prostate and now stage IV NSCLC admitted with acute renal failure, R extensive DVT and R chest wall pain    Interval Hx:  Patient is resting in bed upon visit. He reports that he is feeling better overall today. He denies pain, says that his appetite is low but is drinking Ensure. He reports that got up and walked some today. He is s/p bilateral PCN 08/27/2018 - decreased hematuria. His Hgb is up to 9.0 g/dL today. He is hoping to be discharged tomorrow.     Past Medical History:   Diagnosis Date   ??? Lung cancer ([place])    ??? Prostate cancer ([place])    ??? Stroke ([place])     PT STATES, "I HAD A STROKE A LONG TIME AGO"       Past Surgical History:   Procedure Laterality Date   ??? CHEST SURGERY PROCEDURE UNLISTED  04/27/2017    BRONCHOSCOPY/MEDIASTINOSCOPY   ??? HX GI      HERNIA REPAIR   ??? HX ORTHOPAEDIC  1994    ROD IN RIGHT LEG   ??? IR NEPHROSTOMY PERC LT PLC CATH  SI  08/27/2018   ??? IR NEPHROSTOMY PERC RT PLC CATH  SI  08/27/2018     No Known Allergies    Current Facility-Administered Medications   Medication Dose Route Frequency Provider Last Rate Last Dose   ??? lactated Ringers infusion  100 mL/hr IntraVENous CONTINUOUS Debeb, Wondaya T, MD 100 mL/hr at 09/02/18 1136 100 mL/hr at 09/02/18 1136   ??? 0.9% sodium chloride infusion 250 mL  250 mL IntraVENous PRN Rita Ohara, NP       ??? fentaNYL citrate (PF) injection 100 mcg  100 mcg IntraVENous Multiple Arther Abbott, MD   50 mcg at 08/31/18 1405   ??? acetaminophen (TYLENOL) tablet 650 mg  650 mg Oral Q4H PRN Abigail Miyamoto, MD   650 mg at 08/29/18 2104   ??? predniSONE (DELTASONE) tablet 5 mg  5 mg Oral DAILY Emilio Math, MD   5 mg at 09/02/18 1029    ??? senna (SENOKOT) tablet 8.6 mg  1 Tab Oral QHS Emilio Math, MD   8.6 mg at 08/31/18 2205   ??? sodium chloride (NS) flush 5-40 mL  5-40 mL IntraVENous Q8H Emilio Math, MD   10 mL at 09/02/18 1400   ??? sodium chloride (NS) flush 5-40 mL  5-40 mL IntraVENous PRN Emilio Math, MD   10 mL at 09/02/18 1134   ??? LORazepam (ATIVAN) tablet 0.5 mg  0.5 mg Oral BID PRN Emilio Math, MD       ??? oxyCODONE IR (ROXICODONE) tablet 5-10 mg  5-10 mg Oral Q4H PRN Emilio Math, MD   10 mg at 09/01/18 1922   ??? ondansetron [facility]) injection 4 mg  4 mg IntraVENous Q4H PRN Emilio Math, MD         Social History     Socioeconomic History   ??? Marital status: SINGLE     Spouse name: Not on file   ??? Number of children: Not on file   ??? Years of education: Not on file   ??? Highest education level: Not on  file   Tobacco Use   ??? Smoking status: Former Smoker     Packs/day: 0.50     Years: 47.00     Pack years: 23.50     Last attempt to quit: 06/2016     Years since quitting: 2.2   ??? Smokeless tobacco: Never Used   Substance and Sexual Activity   ??? Alcohol use: No   ??? Drug use: No     Family History   Problem Relation Age of Onset   ??? Cancer Mother         COLON   ??? Cancer Father         BRAIN TUMOR   ??? No Known Problems Sister    ??? Arthritis-osteo Brother    ??? No Known Problems Sister    ??? No Known Problems Sister    ??? No Known Problems Sister    ??? No Known Problems Brother    ??? Anesth Problems Neg Hx      ROS  A review of systems was obtained and is negative except as listed in HPI.    ECOG PS is 2    Physical Examination:   Visit Vitals  BP 110/72 (BP 1 Location: Right arm, BP Patient Position: At rest)   Pulse (!) 105   Temp 97.9 ??F (36.6 ??C)   Resp 16   Wt 122 lb 9.2 oz (55.6 kg) Comment: NO DRAINS HANGING FROM BED   SpO2 100%   BMI 19.20 kg/m??     General appearance - Alert, no distress  Mental status - Oriented to person, place, and time  Resp - CTAB, normal respiratory   Cardio - Regular   ABD - Soft. Bilateral PCN with serosanguinous urine  Extremities -Peripheral pulses normal, 1+ LLE edema    LABS  Lab Results   Component Value Date/Time    WBC 6.7 09/02/2018 06:12 AM    HGB 9.0 (L) 09/02/2018 06:12 AM    HCT 28.0 (L) 09/02/2018 06:12 AM    PLATELET 255 09/02/2018 06:12 AM    MCV 87.5 09/02/2018 06:12 AM    ABS. NEUTROPHILS 4.8 09/02/2018 06:12 AM     Lab Results   Component Value Date/Time    Sodium 135 (L) 09/02/2018 06:12 AM    Potassium 4.3 09/02/2018 06:12 AM    Chloride 102 09/02/2018 06:12 AM    CO2 27 09/02/2018 06:12 AM    Glucose 90 09/02/2018 06:12 AM    BUN 25 (H) 09/02/2018 06:12 AM    Creatinine 1.28 09/02/2018 06:12 AM    GFR est AA >60 09/02/2018 06:12 AM    GFR est non-AA 56 (L) 09/02/2018 06:12 AM    Calcium 8.8 09/02/2018 06:12 AM     Lab Results   Component Value Date/Time    AST (SGOT) 26 09/02/2018 06:12 AM    Alk. phosphatase 75 09/02/2018 06:12 AM    Protein, total 7.5 09/02/2018 06:12 AM    Albumin 2.6 (L) 09/02/2018 06:12 AM    Globulin 4.9 (H) 09/02/2018 06:12 AM    A-G Ratio 0.5 (L) 09/02/2018 06:12 AM     Recent Labs     08/26/18  1141 07/05/18  0850 05/11/18  0959 03/24/18  0949 02/17/18  0851 01/01/18  0811 11/27/17  0806   PSA 0.1 0.0*  --  0.1 0.1 0.1 0.1   PSALT  --   --  <0.1  --   --   --   --  IMAGING  PET CT  03/23/17  IMPRESSION: Mass lesion right lower lobe is hypermetabolic and compatible with  the biopsy confirmed the result of adenocarcinoma. There are soft tissue  densities in the anterior abdominal wall bilaterally which are stable compared  to the prior chest abdomen pelvis CT but new compared to the prior PET/CT.  Please correlate with any recent abdominal wall surgery as these may represent  port sites. Otherwise normal tracer distribution. Stable sclerotic osseous  metastatic disease without abnormal activity.    PATHOLOGY  Supraclavicular node   CYTOLOGIC INTERPRETATION:   Adenocarcinoma, consistent with prostate primary   See comment    General Categorization   Positive for malignancy.   Specimen Adequacy   Satisfactory for evaluation.   Comment   Touch preps and a core biopsy are examined and show islands of adenocarcinoma surrounded by fibrous stroma. A panel of immunohistochemical stains was performed to evaluate site of origin. The tumor cells are focally positive for PSA and PSAP and negative for CK7, CK20, TTF-1    CT CAP 03/22/18  IMPRESSION  IMPRESSION:   1. Status post right lung surgery with right perihilar consolidation and  scarring consistent with post surgery and radiation therapy change. These  findings are stable.  2. Enlarged prostate.  3. Sclerotic bone metastases unchanged.  4. Atherosclerotic aorta without aneurysm    CT CAP 07/13/18:   IMPRESSION:   1. Increased size of the prostate and increased nodularity involving the bladder  base and bladder wall.  2. New left hydronephrosis and hydroureter with obstruction at the level of the  ureterovesical junction.  3. Status post right lung surgery with elevation of the right hemidiaphragm and  surgical staples and scarring in the right hilum consistent with postsurgical  and postradiation therapy effect, unchanged.  4. Sclerotic bone metastases to the spine unchanged.  5. New low-attenuation liver lesion could represent a metastasis.  6. Dilated fluid-filled esophagus suggesting esophageal dysmotility with  thickened wall possibly secondary to radiation therapy.  7. Atherosclerotic aorta with coronary artery calcifications. No abdominal  aortic aneurysm.    CT 08/26/2018    IMPRESSION  IMPRESSION:  ??  1. Unchanged appearance of right hemithorax as above.  2. Increased number and size of hypoattenuating lesions of the liver consistent  with worsening hepatic metastatic disease.  3. Bilateral renal pelvocaliectasis and ureteral distention, new on the right  and increased on the left.  4. Foley catheter within urinary bladder which cannot be further assessed.   5. Osteosclerotic metastatic disease.  6. Anasarca appearance demonstrated in the interval.   ??  Pathology from liver biopsy 08/17/18:   Specimen Source   1: Liver, Core biopsy with touch intepretation:   CYTOLOGIC INTERPRETATION:   1. Liver, Core biopsy with touch intepretation:   Poorly differentiated adenocarcinoma consistent with metastatic pulmonary adenocarcinoma   See comment   General Categorization   Positive for malignancy.       ASSESSMENT  [name] is a 68 y.o. male with widely metastatic prostate adenocarcinoma and lung adenocarcinoma status post right lower lobe lobectomy on 05/22/17. Most recent imaging concerning for progression with new liver mets. He is admitted for acute renal failure    PLAN    Acute Renal Failure  Post obstructive  CT with bilateral renal pelvocaliectasis and ureteral distention, new on the right and increased on the left - likely due to previously seen progressive prostate cancer possibly infiltrating the bladder  S/p PCN bilaterally   Urology and  Nephrology inputs are appreciated  Creatinine up to 1.92 on 08/31/18 - likely a prerenal component from poor intake and blood loss  Transfusions and hydration per Nephrology  Creatinine is down to 1.28 today    Lower Extremity Edema and h/o DVT  Diagnosed 04/22/16  Cancer associated  On lifelong Eliquis 15m BID   Now with marked RLE - slightly improved today  Venous duplex obtained in ER with Extensive DVT  Unfortunately he had major bleeding on heparin gtt  We sent him for an IVC filter but his anatomy is not conducive to this  The IVC is occluded by extrinsic compression from? D/W Dr. MDorene Grebe   It does appear that most of the clot is chronic and the anatomy is such that risk for PE is low    Risk of bleeding at the moment will not allow [facility] will hold off    Prostate Cancer Stage IV  Castrate sensitive, treatment na??ve widely metastatic prostate cancer to lymph nodes above and below the diaphragm and bones.   Prostate adenocarcinoma diagnosed on biopsy of the supraclavicular lymph node. Discussed in tumor board and a small cell component has been ruled out.  PSA at the time of diagnosis at 2400.  ??  Currently on Lupron 45 mg every 6 months. On Zytiga 1,0091mdaily + Prednisone 5 mg daily which has controlled his disease now for > 2 years  Though his PSA is still undetectable at 0.1 his prior scans especially the bladder masses suggest progressive prostate cancer and probable castrate resistance  We will hold Zytiga inpatient   Will [facility] and [facility]  R Lung Adenocarcinoma, [id number] stage IIIB- Parietal pleural involvement- PD-L1 99%  S/p RLL lobectomy on 05/22/17 then received PORT  Now with biopsy proven liver metastases  Repeat CT CAP 08/26/18 with unchanged appearance of the right hemithorax  Plan on palliative Keytruda OP    He is scheduled to start this on 09/08/2018    Bone Metastases  Diffuse  Completed palliative radiation that was initiated on 06/09/16    Right Sided Chest Wall Pain  Unclear cause  No corresponding mets  Patient denies pain today    Anemia   Due to progressive cancer, renal failure, hematuria  Transfuse if < 7 g/dl  Hgb up to 9.0 g/dl today s/p 1 unit PRBCs on 08/31/18       Will continue to follow   Hgb is stable at 9.0 g/dl.   Could be discharged tomorrow with PCN in place   He is to be seen by usKoreat 2 pm on 09/08/2018  Oncology NN to visit patient tomorrow per patient    D/W His POA and Dr. DeJerl SantosD, [facility]

## 2018-09-02 NOTE — Progress Notes (Signed)
NAME: Bruce Clark        DOB:  12-24-1950        MRN:  [id number]        Assessment :    Plan:  --AKI- due to obstructive uropathy. UA is bland. No other cause to explain AKI. Basln Cr normal in Nov 2019  Hyponatremia  Obstructive Uropathy- likely due to prostate cancer  Metastatic Prostate and Lung Cancer  Liver mets  HTN --Creatinine continues to improve to 1.2mg /dl.  S/p bilateral PCN's.      IV LR-> some orthostasis noted when ambulating    Oral KCl  IV Mag sulfate    Hgb better s/p PRBC     D/W Dr. Lauro Regulus    Possible d/c tomorrow       Subjective:     Chief Complaint:  No events overnight. In good spirits. No N/V  No pain.     Review of Systems:    Symptom Y/N Comments  Symptom Y/N Comments   Fever/Chills    Chest Pain     Poor Appetite    Edema     Cough    Abdominal Pain     Sputum    Joint Pain     SOB/DOE    Pruritis/Rash     Nausea/vomit    Tolerating PT/OT     Diarrhea    Tolerating Diet     Constipation    Other       Could not obtain due to:      Objective:     VITALS:   Last 24hrs VS reviewed since prior progress note. Most recent are:  Visit Vitals  BP 110/72 (BP 1 Location: Right arm, BP Patient Position: At rest)   Pulse (!) 105   Temp 97.9 ??F (36.6 ??C)   Resp 16   Wt 55.6 kg (122 lb 9.2 oz)   SpO2 100%   BMI 19.20 kg/m??       Intake/Output Summary (Last 24 hours) at 09/02/2018 1536  Last data filed at 09/02/2018 1408  Gross per 24 hour   Intake ???   Output 1575 ml   Net -1575 ml      Telemetry Reviewed:     PHYSICAL EXAM:  General: NAD  B/L PCN-> hematuria  RLE edema      Lab Data Reviewed: (see below)    Medications Reviewed: (see below)    PMH/SH reviewed - no change compared to H&P  ________________________________________________________________________  Care Plan discussed with:  Patient Y    Family      RN     Care Manager                    Consultant:          Comments    >50% of visit spent in counseling and coordination of care       ________________________________________________________________________  Ceana Fiala Sherrye Payor, MD     Procedures: see electronic medical records for all procedures/Xrays and details which  were not copied into this note but were reviewed prior to creation of Plan.      LABS:  Recent Labs     09/02/18  0612 09/01/18  1924  08/31/18  0615   WBC 6.7  --   --  6.0   HGB 9.0* 7.4*   < > 5.1*   HCT 28.0* 22.6*   < > 16.2*   PLT 255  --   --  222    < > =  values in this interval not displayed.     Recent Labs     09/02/18  0612 09/01/18  0611 08/31/18  1725 08/31/18  0615 08/31/18  0336   NA 135* 138  --  138 139   K 4.3 3.2* 3.7 3.0* 2.6*   CL 102 103  --  102 106   CO2 27 26  --  26 24   BUN 25* 28*  --  40* 38*   CREA 1.28 1.42*  --  1.92* 1.65*   GLU 90 97  --  113* 101*   CA 8.8 8.7  --  8.5 7.8*   MG 1.7 1.6  --  2.2 1.3*   PHOS 2.0* 2.1*  --   --  2.6     Recent Labs     09/02/18  0612 09/01/18  0611 08/31/18  0615   SGOT 26 20 20    AP 75 66 71   TP 7.5 7.1 6.5   ALB 2.6* 2.2* 2.2*   GLOB 4.9* 4.9* 4.3*     No results for input(s): INR, PTP, APTT, INREXT, INREXT in the last 72 hours.   No results for input(s): FE, TIBC, PSAT, FERR in the last 72 hours.   No results found for: FOL, RBCF   No results for input(s): PH, PCO2, PO2 in the last 72 hours.  No results for input(s): CPK, CKMB in the last 72 hours.    No lab exists for component: TROPONINI  No components found for: [facility]  Lab Results   Component Value Date/Time    Color YELLOW/STRAW 08/26/2018 02:25 PM    Appearance CLEAR 08/26/2018 02:25 PM    Specific gravity 1.012 08/26/2018 02:25 PM    Specific gravity 1.025 04/22/2016 08:22 PM    pH (UA) 5.0 08/26/2018 02:25 PM    Protein NEGATIVE  08/26/2018 02:25 PM    Glucose NEGATIVE  08/26/2018 02:25 PM    Ketone NEGATIVE  08/26/2018 02:25 PM    Bilirubin NEGATIVE  08/26/2018 02:25 PM    Urobilinogen 0.2 08/26/2018 02:25 PM     Nitrites NEGATIVE  08/26/2018 02:25 PM    Leukocyte Esterase NEGATIVE  08/26/2018 02:25 PM    Epithelial cells FEW 08/26/2018 02:25 PM    Bacteria NEGATIVE  08/26/2018 02:25 PM    WBC 0-4 08/26/2018 02:25 PM    RBC 0-5 08/26/2018 02:25 PM       MEDICATIONS:  Current Facility-Administered Medications   Medication Dose Route Frequency   ??? lactated Ringers infusion  100 mL/hr IntraVENous CONTINUOUS   ??? potassium chloride SR (KLOR-CON 10) tablet 40 mEq  40 mEq Oral BID   ??? 0.9% sodium chloride infusion 250 mL  250 mL IntraVENous PRN   ??? fentaNYL citrate (PF) injection 100 mcg  100 mcg IntraVENous Multiple   ??? acetaminophen (TYLENOL) tablet 650 mg  650 mg Oral Q4H PRN   ??? predniSONE (DELTASONE) tablet 5 mg  5 mg Oral DAILY   ??? senna (SENOKOT) tablet 8.6 mg  1 Tab Oral QHS   ??? sodium chloride (NS) flush 5-40 mL  5-40 mL IntraVENous Q8H   ??? sodium chloride (NS) flush 5-40 mL  5-40 mL IntraVENous PRN   ??? LORazepam (ATIVAN) tablet 0.5 mg  0.5 mg Oral BID PRN   ??? oxyCODONE IR (ROXICODONE) tablet 5-10 mg  5-10 mg Oral Q4H PRN   ??? ondansetron (ZOFRAN) injection 4 mg  4 mg IntraVENous Q4H PRN

## 2018-09-02 NOTE — Progress Notes (Signed)
Oncology Nurse [facility]   Phone: [phone]    Appreciate referral from Dr. Melony Overly to assess needs and help with discharge planning and follow up after discharge.      He is a gentleman with metastatic prostate and lung (NSCLC) malignancies.  Now  with biopsy proven progressed liver metastasis.  He receives lupron every 6 months and has been on zytiga for several years. He has received palliative RT for bony mets.  He is scheduled to start [place] on 09/08/2018.          Admitted for ARF, LLE edema/DVT and anemia.  He's had bilat PCN placed this admission as well as PRBCs.      Actions:    I met with Bruce Clark at bedside this afternoon. Described ONN role.  We have met back in 2017.  He's been up walking with PT and feels good about being OOB.  Thinks he might go home tomorrow.  He is tearful and emotional but composes self quickly, states "this stage 4 cancer is hard."  He wants to go home.  Is vague about what he understands about his treatment -- "I do need my treatment" and he wants treatment but can't describe it.  About his PCN, he remarks, "Oh I can take care of that" and points to drainage bags.  He defers to Bruce Clark, his Development worker, international aid who is at home.  His conversation rambles and is disjointed at times, not detail oriented.     Bruce Clark tells me he and Bruce Clark live with her nephew who works at [employer]  He often cannot provide transportation and neither he or Bruce Clark drive.  They have paid OOP for Uber to get to appts.  Bruce Clark remembers help from [facility] in the past.    He phoned Bruce Clark and we spoke.  She tells me she has been to her doctor and has been told today she is free of cancer -- "I have an ostomy."  She anticipates that  Bruce Clark might be discharged tomorrow and plans to come to hospital around 05-1029 to see him tomorrow.      Discussed with Bruce Clark, CRM.    Probable discharge home tomorrow --will need HH PT and skilled nursing (PCN).     Can see that Bruce Clark has just turned 28 and has Medicare A, B and D.  He no longer has MCD benefits.  He may be uninsured?    Plan:    ? Meet with Bruce Clark on 1/17: she will call me when she arrives at [facility].    ? Assess possible care barriers -- transportation, financial, understanding.  ? Consider referral to [facility] for progressed advanced disease/ACP.  He's not seen PM since 2017. AMD is on file.   ? Follow with Bruce Clark team    [name] MS RN AOCNS

## 2018-09-02 NOTE — Progress Notes (Signed)
Progress  Notes by Philmore Pali, MD at 09/02/18 1536                Author: Philmore Pali, MD  Service: Nephrology  Author Type: Physician       Filed: 09/02/18 1537  Date of Service: 09/02/18 1536  Status: Signed          Editor: Philmore Pali, MD (Physician)                                                                                                 NAME: Bruce Clark         DOB:  [birth year]         MRN:  [id number]          Assessment :     Plan:      --AKI- due to obstructive uropathy. UA is bland. No other cause to explain AKI. Basln Cr normal in Nov 2019   Hyponatremia   Obstructive Uropathy- likely due to prostate cancer   Metastatic Prostate and Lung Cancer   Liver mets   HTN  --Creatinine continues to improve to 1.2mg /dl.  S/p bilateral PCN's.        IV LR-> some orthostasis noted when ambulating      Oral KCl   IV Mag sulfate      Hgb better s/p PRBC       D/W Dr. Lauro Regulus      Possible d/c tomorrow             Subjective:        Chief Complaint:  No events overnight. In good spirits. No N/V  No pain.       Review of Systems:              Symptom  Y/N  Comments    Symptom  Y/N  Comments             Fever/Chills        Chest Pain                 Poor Appetite        Edema                 Cough        Abdominal Pain         Sputum        Joint Pain         SOB/DOE        Pruritis/Rash         Nausea/vomit        Tolerating PT/OT         Diarrhea        Tolerating Diet                 Constipation        Other               Could not obtain due to:            Objective:  VITALS:    Last 24hrs VS reviewed since prior progress note. Most recent are:   Visit Vitals      BP  110/72 (BP 1 Location: Right arm, BP Patient Position: At rest)     Pulse  (!) 105     Temp  97.9 ??F (36.6 ??C)     Resp  16     Wt  55.6 kg (122 lb 9.2 oz)     SpO2  100%        BMI  19.20 kg/m??           Intake/Output Summary (Last 24 hours) at 09/02/2018 1536   Last data filed at 09/02/2018 1408     Gross per 24 hour         Intake  --        Output  1575 ml        Net  -1575 ml         Telemetry Reviewed:       PHYSICAL EXAM:   General: NAD   B/L PCN-> hematuria   RLE edema         Lab Data Reviewed: (see below)      Medications Reviewed: (see below)      PMH/SH reviewed - no change compared to H&P   ________________________________________________________________________   Care Plan discussed with:       Patient  Y           Family              RN             Care Manager                            Consultant:                     Comments         >50% of visit spent in counseling and coordination of care            ________________________________________________________________________   Champion Corales Sherrye Payor, MD       Procedures: see electronic medical records for all procedures/Xrays and details which   were not copied into this note but were reviewed prior to creation of Plan.        LABS:     Recent Labs              09/02/18   0612  09/01/18   1924    08/31/18   0615     WBC  6.7   --    --   6.0     HGB  9.0*  7.4*    < >  5.1*     HCT  28.0*  22.6*    < >  16.2*     PLT  255   --    --   222        < > = values in this interval not displayed.          Recent Labs               09/02/18   0612  09/01/18   0611  08/31/18   1725  08/31/18   0615  08/31/18   0336     NA  135*  138   --   138  139     K  4.3  3.2*  3.7  3.0*  2.6*     CL  102  103   --   102  106     CO2  27  26   --   26  24     BUN  25*  28*   --   40*  38*     CREA  1.28  1.42*   --   1.92*  1.65*     GLU  90  97   --   113*  101*     CA  8.8  8.7   --   8.5  7.8*     MG  1.7  1.6   --   2.2  1.3*            PHOS  2.0*  2.1*   --    --   2.6          Recent Labs             09/02/18   0612  09/01/18   0611  08/31/18   0615     SGOT  26  20  20      AP  75  66  71     TP  7.5  7.1  6.5     ALB  2.6*  2.2*  2.2*          GLOB  4.9*  4.9*  4.3*        No results for input(s): INR, PTP, APTT, INREXT, INREXT in the last 72 hours.    No results for input(s): FE, TIBC, PSAT,  FERR in the last 72 hours.    No results found for: FOL, RBCF    No results for input(s): PH, PCO2, PO2 in the last 72 hours.   No results for input(s): CPK, CKMB in the last 72 hours.      No lab exists for component: TROPONINI   No components found for: [facility]     Lab Results         Component  Value  Date/Time            Color  YELLOW/STRAW  08/26/2018 02:25 PM       Appearance  CLEAR  08/26/2018 02:25 PM       Specific gravity  1.012  08/26/2018 02:25 PM       Specific gravity  1.025  04/22/2016 08:22 PM       pH (UA)  5.0  08/26/2018 02:25 PM       Protein  NEGATIVE   08/26/2018 02:25 PM       Glucose  NEGATIVE   08/26/2018 02:25 PM       Ketone  NEGATIVE   08/26/2018 02:25 PM       Bilirubin  NEGATIVE   08/26/2018 02:25 PM       Urobilinogen  0.2  08/26/2018 02:25 PM       Nitrites  NEGATIVE   08/26/2018 02:25 PM       Leukocyte Esterase  NEGATIVE   08/26/2018 02:25 PM       Epithelial cells  FEW  08/26/2018 02:25 PM       Bacteria  NEGATIVE   08/26/2018 02:25 PM       WBC  0-4  08/26/2018 02:25 PM            RBC  0-5  08/26/2018 02:25 PM           MEDICATIONS:  Current Facility-Administered Medications          Medication  Dose  Route  Frequency           ?  lactated Ringers infusion   100 mL/hr  IntraVENous  CONTINUOUS     ?  potassium chloride SR (KLOR-CON 10) tablet 40 mEq   40 mEq  Oral  BID     ?  0.9% sodium chloride infusion 250 mL   250 mL  IntraVENous  PRN     ?  fentaNYL citrate (PF) injection 100 mcg   100 mcg  IntraVENous  Multiple     ?  acetaminophen (TYLENOL) tablet 650 mg   650 mg  Oral  Q4H PRN     ?  predniSONE (DELTASONE) tablet 5 mg   5 mg  Oral  DAILY     ?  senna (SENOKOT) tablet 8.6 mg   1 Tab  Oral  QHS     ?  sodium chloride (NS) flush 5-40 mL   5-40 mL  IntraVENous  Q8H     ?  sodium chloride (NS) flush 5-40 mL   5-40 mL  IntraVENous  PRN     ?  LORazepam (ATIVAN) tablet 0.5 mg   0.5 mg  Oral  BID PRN     ?  oxyCODONE IR (ROXICODONE) tablet 5-10 mg   5-10 mg  Oral  Q4H PRN            ?  ondansetron (ZOFRAN) injection 4 mg   4 mg  IntraVENous  Q4H PRN

## 2018-09-02 NOTE — Progress Notes (Signed)
Oncology Nurse Navigator   [facility]   Phone: [phone]    Appreciate referral from Dr. Dionicia Abler to assess needs and help with discharge planning and follow up after discharge.      He is a gentleman with metastatic prostate and lung (NSCLC) malignancies.  Now  with biopsy proven progressed liver metastasis.  He receives lupron every 6 months and has been on zytiga for several years. He has received palliative RT for bony mets.  He is scheduled to start [place] on 09/08/2018.          Admitted for ARF, LLE edema/DVT and anemia.  He's had bilat PCN placed this admission as well as PRBCs.      Actions:    I met with Bruce Clark at bedside this afternoon. Described ONN role.  We have met back in 2017.  He's been up walking with PT and feels good about being OOB.  Thinks he might go home tomorrow.  He is tearful and emotional but composes self quickly, states "this stage 4 cancer is hard."  He wants to go home.  Is vague about what he understands about his treatment -- "I do need my treatment" and he wants treatment but can't describe it.  About his PCN, he remarks, "Oh I can take care of that" and points to drainage bags.  He defers to Bruce Clark, his Designer, industrial/product who is at home.  His conversation rambles and is disjointed at times, not detail oriented.     Bruce Clark tells me he and Bruce Clark live with her nephew who works at [employer].  He often cannot provide transportation and Clark he or Bruce Clark drive.  They have paid OOP for [employer] to get to appts.  [name] remembers help from [facility] in the past.    He phoned Bruce Clark and we spoke.  She tells me she has been to her doctor and has been told today she is free of cancer -- "I have an ostomy."  She anticipates that  Bruce Clark might be discharged tomorrow and plans to come to hospital around 05-1029 to see him tomorrow.      Discussed with Bruce Clark, CRM.    Probable discharge home tomorrow --will need HH PT and skilled nursing (PCN).    Can see that Bruce Clark has just turned 67 and has  Medicare A, B and D.  He no longer has MCD benefits.  He may be uninsured?    Plan:    . Meet with Bruce Clark on 1/17: she will call me when she arrives at [facility].    . Assess possible care barriers -- transportation, financial, understanding.  . Consider referral to [facility] for progressed advanced disease/ACP.  He's not seen PM since 2017. AMD is on file.   . Follow with Dr. Inge Rise team    Roselee Culver MS RN AOCNS

## 2018-09-02 NOTE — Progress Notes (Signed)
Progress Notes by Andris Baumann T, MD at 09/02/18 1301                Author: Carolee Rota, MD  Service: Internal Medicine  Author Type: Physician       Filed: 09/02/18 1403  Date of Service: 09/02/18 1301  Status: Signed          Editor: Carolee Rota, MD (Physician)                    [facility]                                                                                              Hospitalist Progress Note   Carolee Rota, MD   Answering service: [phone] OR [phone] from in house phone            Date of Service:  09/02/2018   NAME:  Bruce Clark   DOB:  04-28-1951   MRN:  [id number]           Admission Summary:        Admitted weak and debilitated for eval insetting of lung cancer and prostate cancer       Interval history / Subjective:             09/02/2018 :   He worked with PT,he was orthostatic   No sob ,chest pain or palpitation now back in bed.             Assessment & Plan:          Orthostatic hypotension and tachycardia due to acute debility?some degree of hypovolemia   -RL 100 ml/hr   -Pt reassessment tomorrow          Severe AKI due to obstructive uropathy   -S/p bilateral PCN.He will be discharged with PCN   -Renal function improved greatly   -Nephrology help appreciated       Acute on chronic anemia,acute drop due likely to hematuria that was PTA   -transfused.Monitor.HB 9.0         DVT - for heparin drip, (failed Eliquis taken up to admission)   -Off heparin due to major bleeding   -IVC could be placed due to anatomy not conducive    -Hem/onc advised...> no anticoagulation due to major bleeding risk and PE risk thought to be low                 Right lung adenocarcinoma   -S/p RLL lobectomy   +liver mets   Oncology following.Pallative keytruda scheduled for 09/08/18      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      Chronic  Severe Protein-Calorie Malnutrition   -dietary consult         Code status: DNR    DVT prophylaxis:scd,   Anticipate d/c tomorrow if HB stable.           Hospital Problems  Date Reviewed:  08/30/2018                         Codes  Class  Noted  POA              * (Principal) AKI (acute kidney injury) [facility])  ICD-10-CM: N17.9   ICD-9-CM: 584.9    08/26/2018  Unknown                               Review of Systems:     Pertinent items are noted in HPI.  Fortunately no pain.           Vital Signs:      Last 24hrs VS reviewed since prior progress note. Most recent are:   Visit Vitals      BP  128/73     Pulse  (!) 116     Temp  96.4 ??F (35.8 ??C)     Resp  16     Wt  55.6 kg (122 lb 9.2 oz)     SpO2  100%        BMI  19.20 kg/m??              Intake/Output Summary (Last 24 hours) at 09/02/2018 1301   Last data filed at 09/02/2018 0553     Gross per 24 hour        Intake  --        Output  1225 ml        Net  -1225 ml              Physical Examination:               Stable exam  Emaciated.       Constitutional:   No acute distress, weak, debilitated, and  Emaciated, appearing. Else cooperative, pleasant      ENT:   Oral mucous moist, oropharynx benign.      Resp:   CTA bilaterally. No wheezing/rhonchi/rales. No accessory muscle use     CV:   Regular rhythm, normal rate, no  gallops, rubs      GI:   Soft, non distended, non tender. normoactive bowel sounds, no hepatosplenomegaly    bilateral PCN tubes in place.      Musculoskeletal:   +edema.Right leg swelling >>left due to extensive DVT         Neurologic:   Moves all extremities.  AAOx3, CN II-XII reviewed       Psych:  Good insight, Not anxious nor agitated.   Skin:  Good turgor, no rashes or ulcers               Data Review:      Review and/or order of clinical lab test           Labs:          Recent Labs              09/02/18   0612  09/01/18   1924    08/31/18   0615     WBC  6.7   --    --   6.0     HGB  9.0*  7.4*    < >  5.1*     HCT  28.0*  22.6*    < >  16.2*     PLT   255   --    --  222        < > = values in this interval not displayed.          Recent Labs               09/02/18   0612  09/01/18   0611  08/31/18   1725  08/31/18   0615  08/31/18   0336     NA  135*  138   --   138  139     K  4.3  3.2*  3.7  3.0*  2.6*     CL  102  103   --   102  106     CO2  27  26   --   26  24     BUN  25*  28*   --   40*  38*     CREA  1.28  1.42*   --   1.92*  1.65*     GLU  90  97   --   113*  101*     CA  8.8  8.7   --   8.5  7.8*     MG  1.7  1.6   --   2.2  1.3*            PHOS  2.0*  2.1*   --    --   2.6          Recent Labs             09/02/18   0612  09/01/18   0611  08/31/18   0615     SGOT  26  20  20      ALT  10*  <6*  7*     AP  75  66  71     TBILI  0.3  0.3  0.2     TP  7.5  7.1  6.5     ALB  2.6*  2.2*  2.2*          GLOB  4.9*  4.9*  4.3*          Recent Labs           08/30/18   1514        APTT  60.4*         No results for input(s): FE, TIBC, PSAT, FERR in the last 72 hours.    No results found for: FOL, RBCF    No results for input(s): PH, PCO2, PO2 in the last 72 hours.   No results for input(s): CPK, CKNDX, TROIQ in the last 72 hours.      No lab exists for component: CPKMB     Lab Results         Component  Value  Date/Time            Cholesterol, total  184  07/01/2016 10:50 PM       HDL Cholesterol  64  07/01/2016 10:50 PM       LDL, calculated  109.2 (H)  07/01/2016 10:50 PM       Triglyceride  54  07/01/2016 10:50 PM            CHOL/HDL Ratio  2.9  07/01/2016 10:50 PM          Lab Results         Component  Value  Date/Time  Glucose (POC)  103 (H)  04/28/2016 10:31 AM       Glucose (POC)  90  04/26/2016 10:33 PM       Glucose (POC)  71  04/26/2016 05:12 PM       Glucose (POC)  106 (H)  04/26/2016 12:03 PM            Glucose (POC)  117 (H)  04/25/2016 04:52 PM          Lab Results         Component  Value  Date/Time            Color  YELLOW/STRAW  08/26/2018 02:25 PM       Appearance  CLEAR  08/26/2018 02:25 PM       Specific gravity  1.012   08/26/2018 02:25 PM       Specific gravity  1.025  04/22/2016 08:22 PM       pH (UA)  5.0  08/26/2018 02:25 PM       Protein  NEGATIVE   08/26/2018 02:25 PM       Glucose  NEGATIVE   08/26/2018 02:25 PM       Ketone  NEGATIVE   08/26/2018 02:25 PM       Bilirubin  NEGATIVE   08/26/2018 02:25 PM       Urobilinogen  0.2  08/26/2018 02:25 PM       Nitrites  NEGATIVE   08/26/2018 02:25 PM       Leukocyte Esterase  NEGATIVE   08/26/2018 02:25 PM       Epithelial cells  FEW  08/26/2018 02:25 PM       Bacteria  NEGATIVE   08/26/2018 02:25 PM       WBC  0-4  08/26/2018 02:25 PM            RBC  0-5  08/26/2018 02:25 PM                Medications Reviewed:          Current Facility-Administered Medications          Medication  Dose  Route  Frequency           ?  lactated Ringers infusion   100 mL/hr  IntraVENous  CONTINUOUS     ?  potassium chloride SR (KLOR-CON 10) tablet 40 mEq   40 mEq  Oral  BID     ?  0.9% sodium chloride infusion 250 mL   250 mL  IntraVENous  PRN     ?  fentaNYL citrate (PF) injection 100 mcg   100 mcg  IntraVENous  Multiple     ?  acetaminophen (TYLENOL) tablet 650 mg   650 mg  Oral  Q4H PRN     ?  predniSONE (DELTASONE) tablet 5 mg   5 mg  Oral  DAILY     ?  senna (SENOKOT) tablet 8.6 mg   1 Tab  Oral  QHS     ?  sodium chloride (NS) flush 5-40 mL   5-40 mL  IntraVENous  Q8H     ?  sodium chloride (NS) flush 5-40 mL   5-40 mL  IntraVENous  PRN     ?  LORazepam (ATIVAN) tablet 0.5 mg   0.5 mg  Oral  BID PRN     ?  oxyCODONE IR (ROXICODONE) tablet 5-10 mg   5-10 mg  Oral  Q4H PRN           ?  ondansetron (ZOFRAN) injection 4 mg   4 mg  IntraVENous  Q4H PRN        ______________________________________________________________________   EXPECTED LENGTH OF STAY: 3d 4h   ACTUAL LENGTH OF STAY:          7                    Jamontae Thwaites Sonnie Alamo, MD

## 2018-09-02 NOTE — Progress Notes (Signed)
 NUTRITION COMPLETE ASSESSMENT    RECOMMENDATIONS:   1. RD to change ONS to Ensure Enlive and liberalize diet to promote PO intake  2. Replete Phos    Patient meets criteria for severe Protein Calorie Malnutrition as evidenced by:   ASPEN Malnutrition Criteria  Acute Illness, Chronic Illness, or Social/Enviornmental: Chronic illness  Weight Loss: Greater than 10% x 6 mos  Body Fat Loss: Severe  Muscle Mass Loss: Severe  Fluid Accumulation: Severe  ASPEN Malnutrition Score - Chronic Illness: 24  Chronic Illness - Malnutrition Diagnosis: Severe malnutrition.         Interventions/Plan:   Food/Nutrient Delivery:  Modify diet/texture/consistency/nutrients Commercial supplement          Assessment:   Reason for Assessment: LOS - at nutrition risk      Diet: cardiac  Supplements: Nepro TID with meals  Meal Intake:   Patient Vitals for the past 100 hrs:   % Diet Eaten   09/02/18 0839 25 %   08/29/18 1726 0 %   08/29/18 1314 0 %          Subjective:  UBW 147 lb.  Appetite is getting better.  I'll gain the weight back in no time.  Like Ensure.  Drink this or Boost at home.  Family bringing me in some from home.    Objective:  68 y.o. male admitted with AKI (acute kidney injury) (HCC) [N17.9]    Pt has a past medical history of Lung cancer (HCC), Prostate cancer (HCC), and Stroke (HCC).    Pt is very cachectic.  Hx of significant wt loss within past year (25 lb, 17% BW).  Followed by OP oncology dietitian.  All weights are bedweights - admitted at 145 lb, however all drains and bed cleared on 1/11 at which time pt was 122 lb at re-weigh (and this appears to be more accurate).    Diet currently cardiac with Nepro supplement which pt dislikes.  Neither are indicated by labs.  Will liberalize diet and change supplement to Ensure Enlive TID (1050 kcal, 60 gm pro).  Pt encouraged to continue supplements at home to promote weight gain.    Wt Readings from Last 20 Encounters:   09/02/18 55.6 kg (122 lb 9.2 oz)   08/26/18 66 kg (145  lb 9.6 oz)   08/17/18 68 kg (150 lb)   07/09/18 64.7 kg (142 lb 11.2 oz)   05/11/18 62.1 kg (137 lb)   03/24/18 63.9 kg (140 lb 12.8 oz)   02/17/18 64.3 kg (141 lb 12.8 oz)   01/21/18 62.6 kg (138 lb)   11/25/17 59.9 kg (132 lb)   11/04/17 64 kg (141 lb)   10/30/17 61 kg (134 lb 6.4 oz)   10/21/17 61.2 kg (135 lb)   10/06/17 63.2 kg (139 lb 6.4 oz)   09/08/17 65.3 kg (144 lb)   09/08/17 65.4 kg (144 lb 3.2 oz)   08/25/17 65.7 kg (144 lb 12.8 oz)   07/29/17 66.7 kg (147 lb)   07/24/17 67.8 kg (149 lb 6.4 oz)   07/03/17 67.5 kg (148 lb 12.8 oz)   06/08/17 70.8 kg (156 lb)         Nutritionally Significant Medications:   Prednisone, senokot      Intake/Output:    Intake/Output Summary (Last 24 hours) at 09/02/2018 1659  Last data filed at 09/02/2018 1408  Gross per 24 hour   Intake --   Output 1575 ml   Net -1575 ml  Last BM: 1/16    Physical Findings:    Nutrition focused physical exam:   Muscle Loss: Temple Region Muscle Wasting-Severe  Clavicle Region Muscle Wasting-Severe  Shoulder Region Muscle Wasting-Severe  Hand Region Muscle Wasting-Severe  Scapula Region Muscle Wasting-Severe    Fat Loss: Orbital Region Fat Loss-Severe  Cheek Region (Buccal Fat Pads) Fat Loss-Severe  Upper Arm Region (Triceps) Fat Loss-Severe  Midaxillary Line at Iliac Crest/Ribs Fat Loss-Severe      Edema:  LLE: 1+  RLE: 2+, pitting    Abdomen: WDL    Skin Integrity: intact        Anthropometrics:  Weight Loss Metrics 09/02/2018 08/26/2018 08/26/2018 08/26/2018 08/17/2018 07/09/2018 05/11/2018   Today's Wt 122 lb 9.2 oz - 145 lb 9.6 oz - 150 lb 142 lb 11.2 oz 137 lb   BMI - 19.2 kg/m2 - 22.8 kg/m2 23.49 kg/m2 22.35 kg/m2 21.46 kg/m2      Weight Source: Bed  Height: 5' 7 (170.2 cm),    Body mass index is 19.2 kg/m.     IBW : 67.1 kg (148 lb), % IBW (Calculated): 82.82 %  Usual Body Weight: 66.7 kg (147 lb),      Labs:      Recent Labs     09/02/18  0612 09/01/18  0611 08/31/18  1725 08/31/18  0615 08/31/18  0336   GLU 90 97  --  113* 101*   BUN  25* 28*  --  40* 38*   CREA 1.28 1.42*  --  1.92* 1.65*   NA 135* 138  --  138 139   K 4.3 3.2* 3.7 3.0* 2.6*   CL 102 103  --  102 106   CO2 27 26  --  26 24   CA 8.8 8.7  --  8.5 7.8*   PHOS 2.0* 2.1*  --   --  2.6   MG 1.7 1.6  --  2.2 1.3*       Recent Labs     09/02/18  0612 09/01/18  0611 08/31/18  0615   SGOT 26 20 20    ALT 10* <6* 7*   AP 75 66 71   TBILI 0.3 0.3 0.2   TP 7.5 7.1 6.5   ALB 2.6* 2.2* 2.2*   GLOB 4.9* 4.9* 4.3*       No results for input(s): LAC in the last 72 hours.    Recent Labs     09/02/18  0612 09/01/18  1924  08/31/18  0615   WBC 6.7  --   --  6.0   HGB 9.0* 7.4*   < > 5.1*   HCT 28.0* 22.6*   < > 16.2*   PLT 255  --   --  222    < > = values in this interval not displayed.       No results for input(s): PREALB in the last 72 hours.    No results for input(s): TRIGL in the last 72 hours.    No results for input(s): GLUCPOC in the last 72 hours.    No results found for: HBA1C, HGBE8, HBA1CPOC, HBA1CEXT, HBA1CEXT    Cultural, religious and ethnic food preferences identified: None  Food Allergies: NKFA  Diet Restrictions: Cultural/Religious Preference(s): None           Pre-Hospitalization:  Usual Appetite: Poor    Current Hospitalization:   Fluid Restriction:    Appetite: Fair  PO Ability: Independent Average po intake:   Average  supplements intake:         Estimated Nutrition Needs:   Kcals/day: 1950 Kcals/day  Protein: 90 g(1.6 gm/kg)  Fluid: 1950 ml(1 mL/kcal)  Based On: Kcal/kg - specify (Comment)(35+ kcal/kg)  Weight Used: Actual wt(55.6 kg)    Pt expected to meet estimated nutrient needs:  Unable to predict at this time  Nutrition Diagnosis:   1. Malnutrition related to disease process as evidenced by severe muscle wasting and SQ fat loss, wt loss of 25 lb (17% BW) earlier this year, poor PO intake     2.   related to   as evidenced by      3.   related to   as evidenced by      Goals:     pt will consume >/=25% of meals and 2-3 supplements daily in next 5-7 days     Monitoring &  Evaluation:   Total energy intake, Oral fluids amount, Protein intake, Liquid meal replacement  Weight/weight change, Electrolyte and renal profile       Previous Nutrition Goals Met:   N/A  Previous Recommendations:    N/A    Education & Discharge Needs:   []  None Identified   [x]  Identified and addressed    [x]  Participated in care plan, discharge planning, and/or interdisciplinary rounds            Gerard JONELLE Molt, RD

## 2018-09-02 NOTE — Progress Notes (Signed)
OCCUPATIONAL THERAPY EVALUATION/DISCHARGE  Patient: Bruce Clark (68 y.o. male)  Date: 09/02/2018  Primary Diagnosis: AKI (acute kidney injury) (HCC) [N17.9]       Precautions: fall       ASSESSMENT  Based on the objective data described below, the patient presents near functional baseline.  Noted patient with extensive RLE DVT but with low risk for PE, not a candidate for anticoagulation, and cleared for mobility. BUE AROM/ strength/ coordination WFL, and patient can tailor sit with each LE, with good UB and LB access for ADLs.  Patient mobilized to bathroom with modified independence with stable BP in standing.  Patient was receptive to education on fall prevention and provided with handout for reinforcement.  Patient has no concerns for completing daily activities at home and does not require additional OT at this time.    Current Level of Function (ADLs/self-care): modified independent    Functional Outcome Measure: The patient scored 90/100 on the Barthel Index outcome measure which is indicative of ~10% impairment in functional ADL performance.       PLAN :    Recommendation for discharge: (in order for the patient to meet his/her long term goals)  No skilled occupational therapy/ follow up rehabilitation needs identified at this time.    This discharge recommendation:  Has not yet been discussed the attending provider and/or case management       SUBJECTIVE:   Patient stated "I'm doing okay."    OBJECTIVE DATA SUMMARY:   HISTORY:   Past Medical History:   Diagnosis Date   . Lung cancer (HCC)    . Prostate cancer (HCC)    . Stroke [facility])     PT STATES, "I HAD A STROKE A LONG TIME AGO"     Past Surgical History:   Procedure Laterality Date   . CHEST SURGERY PROCEDURE UNLISTED  04/27/2017    BRONCHOSCOPY/MEDIASTINOSCOPY   . HX GI      HERNIA REPAIR   . HX ORTHOPAEDIC  1994    ROD IN RIGHT LEG   . IR NEPHROSTOMY PERC LT PLC CATH  SI  08/27/2018   . IR NEPHROSTOMY PERC RT PLC CATH  SI  08/27/2018       Prior Level  of Function/Environment/Context: independent, occasionally using SPC, reports no falls  Expanded or extensive additional review of patient history:   Home Situation  Home Environment: Private residence  # Steps to Enter: 8  Rails to Enter: Yes  One/Two Story Residence: One story  Living Alone: No  Support Systems: Friends \ neighbors  Patient Expects to be Discharged to:: Private residence  Current DME Used/Available at Home: Cane, straight     EXAMINATION OF PERFORMANCE DEFICITS:  Cognitive/Behavioral Status:  Neurologic State: Alert     Cognition: Follows commands;Appropriate decision making;Appropriate for age attention/concentration;Appropriate safety awareness  Perception: Appears intact  Perseveration: No perseveration noted  Safety/Judgement: Awareness of environment;Insight into deficits    Skin: visible skin appears intact    Edema: none noted    Hearing:  Auditory  Auditory Impairment: None    Vision/Perceptual:    Tracking: Able to track stimulus in all quadrants w/o difficulty                      Acuity: Within Defined Limits         Range of Motion:    AROM: Within functional limits  Strength:    Strength: Within functional limits                Coordination:  Coordination: Within functional limits  Fine Motor Skills-Upper: Left Intact;Right Intact    Gross Motor Skills-Upper: Left Intact;Right Intact    Tone & Sensation:    Tone: Normal  Sensation: Intact                      Balance:  Sitting: Intact  Standing: Impaired;Without support  Standing - Static: Good  Standing - Dynamic : Good    Functional Mobility and Transfers for ADLs:  Bed Mobility:  Supine to Sit: Modified independent  Scooting: Modified independent    Transfers:  Sit to Stand: Modified independent  Stand to Sit: Modified independent    ADL Assessment:  Feeding: Independent(inferred)    Oral Facial Hygiene/Grooming: Modified Independent(inferred)    Bathing: Modified independent(inferred)    Upper Body Dressing:  Modified independent(inferred)    Lower Body Dressing: Modified independent(inferred, practiced tailor sit with each LE)    Toileting: Modified independent(inferred)                ADL Intervention and task modifications:          Cognitive Retraining  Safety/Judgement: Awareness of environment;Insight into deficits      Functional Measure:  Barthel Index:    Bathing: 5  Bladder: 10  Bowels: 10  Grooming: 5  Dressing: 10  Feeding: 10  Mobility: 10  Stairs: 5  Toilet Use: 10  Transfer (Bed to Chair and Back): 15  Total: 90/100        The Barthel ADL Index: Guidelines  1. The index should be used as a record of what a patient does, not as a record of what a patient could do.  2. The main aim is to establish degree of independence from any help, physical or verbal, however minor and for whatever reason.  3. The need for supervision renders the patient not independent.  4. A patient's performance should be established using the best available evidence. Asking the patient, friends/relatives and nurses are the usual sources, but direct observation and common sense are also important. However direct testing is not needed.  5. Usually the patient's performance over the preceding 24-48 hours is important, but occasionally longer periods will be relevant.  6. Middle categories imply that the patient supplies over 50 per cent of the effort.  7. Use of aids to be independent is allowed.    Clarisa Kindred., Barthel, D.W. [phone]). Functional evaluation: the Barthel Index. Md State Med J (14)2.  Zenaida Niece der [place], J.J.M.F, [place], Ian Malkin., Margret Chance., [place], [place]. (1999). Measuring the change indisability after inpatient rehabilitation; comparison of the responsiveness of the Barthel Index and Functional Independence Measure. Journal of Neurology, Neurosurgery, and Psychiatry, 66(4), [phone].  Dawson Bills, N.J.A, [place],  W.J.M, & [place], M.A. (2004.) Assessment of post-stroke quality of life in cost-effectiveness  studies: The usefulness of the Barthel Index and the EuroQoL-5D. Quality of Life Research, 47, [phone]         Occupational Therapy Evaluation Charge Determination   History Examination Decision-Making   LOW Complexity : Brief history review  LOW Complexity : 1-3 performance deficits relating to physical, cognitive , or psychosocial skils that result in activity limitations and / or participation restrictions  MEDIUM Complexity : Patient may present with comorbidities that affect occupational performnce. Miniml to moderate modification of tasks or assistance (eg,  physical or verbal ) with assesment(s) is necessary to enable patient to complete evaluation       Based on the above components, the patient evaluation is determined to be of the following complexity level: LOW   Pain Rating:  Patient did not indicate pain    Activity Tolerance:   Sitting EOB: BP 113/80, HR 95  Standing: BP 112/80, HR 95    After treatment patient left in no apparent distress:    Supine in bed and Call bell within reach    COMMUNICATION/EDUCATION:   The patient's plan of care was discussed with: Physical Therapist and Registered Nurse.    Thank you for this referral.  Awilda Metro, OT  Time Calculation: 16 mins

## 2018-09-02 NOTE — Progress Notes (Signed)
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 Clark with stage IV  prostate and now stage IV NSCLC admitted with acute renal failure, R extensive DVT and R chest wall pain      Interval Hx:   Patient is resting in bed upon visit. He reports that he is feeling better overall today. He denies pain, says that his appetite is low but is drinking Ensure. He reports that got up and walked some  today. He is s/p bilateral PCN 08/27/2018 - decreased hematuria. His Hgb is up to 9.0 g/dL today. He is hoping to be discharged tomorrow.         Past Medical History:        Diagnosis  Date         ?  Lung cancer ([place])       ?  Prostate cancer ([place])       ?  Stroke [facility])            PT STATES, "I HAD A STROKE A LONG TIME AGO"             Past Surgical History:         Procedure  Laterality  Date          ?  CHEST SURGERY PROCEDURE UNLISTED    04/27/2017          BRONCHOSCOPY/MEDIASTINOSCOPY          ?  HX GI              HERNIA REPAIR          ?  HX ORTHOPAEDIC    1994          ROD IN RIGHT LEG          ?  IR NEPHROSTOMY PERC LT PLC CATH  SI    08/27/2018          ?  IR NEPHROSTOMY PERC RT PLC CATH  SI    08/27/2018        No Known Allergies        Current Facility-Administered Medications             Medication  Dose  Route  Frequency  Provider  Last Rate  Last Dose              ?  lactated Ringers infusion   100 mL/hr  IntraVENous   CONTINUOUS  Debeb, BWGYKZL T, MD  100 mL/hr at 09/02/18 1136  100 mL/hr at 09/02/18 1136              ?  0.9% sodium chloride infusion 250 mL   250 mL  IntraVENous  PRN  Rita Ohara, NP           ?  fentaNYL citrate (PF) injection 100 mcg   100 mcg  IntraVENous  Multiple  Dorene Grebe, Namit, MD     50 mcg at 08/31/18 1405     ?  acetaminophen (TYLENOL) tablet 650 mg   650 mg  Oral  Q4H PRN  Abigail Miyamoto, MD     650 mg at 08/29/18 2104     ?  predniSONE (DELTASONE) tablet 5 mg   5 mg  Oral  DAILY  Emilio Math, MD     5 mg at 09/02/18 1029     ?  senna (SENOKOT) tablet 8.6 mg   1 Tab  Oral  QHS  Emilio Math, MD     8.6 mg at 08/31/18 2205     ?  sodium chloride (NS) flush 5-40 mL   5-40 mL  IntraVENous  Q8H  Emilio Math, MD     10 mL at 09/02/18 1400     ?  sodium chloride (NS) flush 5-40 mL   5-40 mL  IntraVENous  PRN  Emilio Math, MD     10 mL at 09/02/18 1134     ?  LORazepam (ATIVAN) tablet 0.5 mg   0.5 mg  Oral  BID PRN  Emilio Math, MD           ?  oxyCODONE IR (ROXICODONE) tablet 5-10 mg   5-10 mg  Oral  Q4H PRN  Emilio Math, MD     10 mg at 09/01/18 1922              ?  ondansetron (ZOFRAN) injection 4 mg   4 mg  IntraVENous  Q4H PRN  Emilio Math, MD                Social History          Socioeconomic History         ?  Marital status:  SINGLE              Spouse name:  Not on file         ?  Number of children:  Not on file     ?  Years of education:  Not on file     ?  Highest education level:  Not on file       Tobacco Use         ?  Smoking status:  Former Smoker              Packs/day:  0.50         Years:  47.00         Pack years:  23.50         Last attempt to quit:  06/2016         Years since quitting:  2.2         ?  Smokeless tobacco:  Never Used       Substance and Sexual Activity         ?  Alcohol use:  No         ?  Drug use:  No          Family History         Problem  Relation  Age of Onset          ?  Cancer  Mother                COLON          ?  Cancer  Father                 BRAIN TUMOR          ?  No Known Problems  Sister       ?  Arthritis-osteo  Brother       ?  No Known Problems  Sister       ?  No Known Problems  Sister       ?  No Known Problems  Sister       ?  No Known Problems  Brother            ?  Anesth Problems  Neg Hx          ROS   A review of systems was obtained and is negative except as listed in HPI.      ECOG PS is 2      Physical Examination:    Visit Vitals      BP  110/72 (BP 1 Location: Right arm, BP Patient Position: At rest)     Pulse  (!) 105     Temp  97.9 ??F (36.6 ??C)     Resp  16         Wt  122 lb 9.2 oz (55.6 kg)  Comment: NO DRAINS HANGING FROM BED        SpO2  100%        BMI  19.20 kg/m??        General appearance - Alert, no distress   Mental status - Oriented to person, place, and time   Resp - CTAB, normal respiratory    Cardio - Regular   ABD - Soft. Bilateral PCN with serosanguinous urine   Extremities -Peripheral pulses normal, 1+ LLE edema      LABS     Lab Results         Component  Value  Date/Time            WBC  6.7  09/02/2018 06:12 AM       HGB  9.0 (L)  09/02/2018 06:12 AM       HCT  28.0 (L)  09/02/2018 06:12 AM       PLATELET  255  09/02/2018 06:12 AM       MCV  87.5  09/02/2018 06:12 AM            ABS. NEUTROPHILS  4.8  09/02/2018 06:12 AM          Lab Results         Component  Value  Date/Time            Sodium  135 (L)  09/02/2018 06:12 AM       Potassium  4.3  09/02/2018 06:12 AM       Chloride  102  09/02/2018 06:12 AM       CO2  27  09/02/2018 06:12 AM       Glucose  90  09/02/2018 06:12 AM       BUN  25 (H)  09/02/2018 06:12 AM       Creatinine  1.28  09/02/2018 06:12 AM       GFR est AA  >60  09/02/2018 06:12 AM       GFR est non-AA  56 (L)  09/02/2018 06:12 AM            Calcium  8.8  09/02/2018 06:12 AM          Lab Results         Component  Value  Date/Time            AST (SGOT)  26  09/02/2018 06:12 AM       Alk. phosphatase  75  09/02/2018 06:12 AM       Protein, total  7.5  09/02/2018 06:12 AM       Albumin   2.6 (L)  09/02/2018 06:12 AM       Globulin  4.9 (H)  09/02/2018 06:12 AM            A-G Ratio  0.5 (L)  09/02/2018 06:12 AM          Recent Labs                 08/26/18   1141  07/05/18   0850  05/11/18   0959  03/24/18   0949  02/17/18   0851  01/01/18   0811  11/27/17   0806     PSA  0.1  0.0*   --   0.1  0.1  0.1  0.1              PSALT   --    --   <0.1   --    --    --    --            IMAGING   PET CT  03/23/17   IMPRESSION: Mass lesion right lower lobe is hypermetabolic and compatible with   the biopsy confirmed the result of adenocarcinoma. There are soft tissue   densities in the anterior abdominal wall bilaterally which are stable compared   to the prior chest abdomen pelvis CT but new compared to the prior PET/CT.   Please correlate with any recent abdominal wall surgery as these may represent   port sites. Otherwise normal tracer distribution. Stable sclerotic osseous   metastatic disease without abnormal activity.      PATHOLOGY   Supraclavicular node    CYTOLOGIC INTERPRETATION:    Adenocarcinoma, consistent with prostate primary    See comment    General Categorization    Positive for malignancy.    Specimen Adequacy    Satisfactory for evaluation.    Comment    Touch preps and a core biopsy are examined and show islands of adenocarcinoma surrounded by fibrous stroma. A panel of immunohistochemical stains was performed  to evaluate site of origin. The tumor cells are focally positive for PSA and PSAP and negative for CK7, CK20, TTF-1      CT CAP 03/22/18   IMPRESSION   IMPRESSION:    1. Status post right lung surgery with right perihilar consolidation and   scarring consistent with post surgery and radiation therapy change. These   findings are stable.   2. Enlarged prostate.   3. Sclerotic bone metastases unchanged.   4. Atherosclerotic aorta without aneurysm      CT CAP 07/13/18:    IMPRESSION:    1. Increased size of the prostate and increased nodularity involving the bladder   base and bladder  wall.   2. New left hydronephrosis and hydroureter with obstruction at the level of the   ureterovesical junction.   3. Status post right lung surgery with elevation  of the right hemidiaphragm and   surgical staples and scarring in the right hilum consistent with postsurgical   and postradiation therapy effect, unchanged.   4. Sclerotic bone metastases to the spine unchanged.   5. New low-attenuation liver lesion could represent a metastasis.   6. Dilated fluid-filled esophagus suggesting esophageal dysmotility with   thickened wall possibly secondary to radiation therapy.   7. Atherosclerotic aorta with coronary artery calcifications. No abdominal   aortic aneurysm.      CT 08/26/2018      IMPRESSION   IMPRESSION:   ??   1. Unchanged appearance of right hemithorax as above.   2. Increased number and size of hypoattenuating lesions of the liver consistent   with worsening hepatic metastatic disease.   3. Bilateral renal pelvocaliectasis and ureteral distention, new on the right   and increased on the left.   4. Foley catheter within urinary bladder which cannot be further assessed.   5. Osteosclerotic metastatic disease.   6. Anasarca appearance demonstrated in the interval.    ??   Pathology from liver biopsy 08/17/18:    Specimen Source    1: Liver, Core biopsy with touch intepretation:    CYTOLOGIC INTERPRETATION:    1. Liver, Core biopsy with touch intepretation:    Poorly differentiated adenocarcinoma consistent with metastatic pulmonary adenocarcinoma    See comment    General Categorization    Positive for malignancy.          ASSESSMENT   Bruce Clark is a  68 y.o. Clark with widely metastatic prostate adenocarcinoma and lung adenocarcinoma status  post right lower lobe lobectomy on 05/22/17. Most recent imaging concerning for progression with new liver mets. He is admitted for acute renal failure      PLAN      Acute Renal Failure   Post obstructive   CT with bilateral renal pelvocaliectasis and ureteral  distention, new on the right and increased on the left - likely due to previously seen progressive prostate cancer possibly  infiltrating the bladder   S/p PCN bilaterally    Urology and Nephrology inputs are appreciated   Creatinine up to 1.92 on 08/31/18 - likely a prerenal component from poor intake and blood loss   Transfusions and hydration per Nephrology   Creatinine is down to 1.28 today      Lower Extremity Edema and h/o DVT   Diagnosed 04/22/16   Cancer associated   On lifelong Eliquis 44m BID    Now with marked RLE - slightly improved today   Venous duplex obtained in ER with Extensive DVT   Unfortunately he had major bleeding on heparin gtt   We sent him for an IVC filter but his anatomy is not conducive to this   The IVC is occluded by extrinsic compression from? D/W Dr. MDorene Grebe     It does appear that most of the clot is chronic and the anatomy is such that risk for PE is low      Risk of bleeding at the moment will not allow [facility] [facility]      Prostate Cancer Stage IV   Castrate sensitive, treatment na??ve widely metastatic prostate cancer to lymph nodes above and below the diaphragm and bones.   Prostate adenocarcinoma diagnosed on biopsy of the supraclavicular lymph node. Discussed in tumor board and a small cell component has been ruled out.   PSA at the time of diagnosis at 2400.   ??  Currently on Lupron 45 mg every 6 months. On Zytiga 1,053m daily + Prednisone 5 mg daily which has controlled his disease now for > 2 years   Though his PSA is still undetectable at 0.1 his prior scans especially the bladder masses suggest progressive prostate cancer and probable castrate resistance   We will hold Zytiga inpatient    [facility]  [facility]   R Lung Adenocarcinoma, [id number] stage IIIB- Parietal  pleural involvement- PD-L1 99%   S/p RLL lobectomy on 05/22/17 then received PORT   Now with biopsy proven liver metastases   Repeat CT CAP 08/26/18 with  unchanged appearance of the right hemithorax   Plan on palliative Keytruda OP      He is scheduled to start this on 09/08/2018      Bone Metastases   Diffuse   Completed palliative radiation that was initiated on 06/09/16      Right Sided Chest Wall Pain   Unclear cause   No corresponding mets   Patient denies pain today      Anemia    Due to progressive cancer, renal failure, hematuria   Transfuse if < 7 g/dl   Hgb up to 9.0 g/dl today s/p 1 unit PRBCs on 08/31/18          Will continue to follow    Hgb is stable at 9.0 g/dl.    Could be discharged tomorrow with PCN in place    He is to be seen by u[place]at 2 pm on 09/08/2018   Oncology NN to visit patient tomorrow per patient      D/W His POA and Dr. DJerl SantosMD, [facility]

## 2018-09-02 NOTE — Progress Notes (Signed)
 Problem: Mobility Impaired (Adult and Pediatric)  Goal: *Acute Goals and Plan of Care (Insert Text)  Description  FUNCTIONAL STATUS PRIOR TO ADMISSION: Patient was modified independent using a single point cane for functional mobility.    HOME SUPPORT PRIOR TO ADMISSION: The patient lived with family but did not require assist.    Physical Therapy Goals  Initiated 09/02/2018  1.  Patient will transfer from bed to chair and chair to bed with modified independence using the least restrictive device within 7 day(s).  2.  Patient will ambulate with modified independence for 200 feet and stable BP and HR with the least restrictive device within 7 day(s).      Outcome: Progressing Towards Goal   PHYSICAL THERAPY EVALUATION  Patient: Bruce Clark (68 y.o. male)  Date: 09/02/2018  Primary Diagnosis: AKI (acute kidney injury) (HCC) [N17.9]        Precautions:          ASSESSMENT  Based on the objective data described below, the patient presents with close to baseline mobility of modified independence.  Today able to ambulate in hall with light hand hold assist to simulate cane and then just supervision.  Assessed on steps as well and managed well.  Patient is limited today by orthostatic hypotension as well as elevated heart rate.  Both recovered with seated rest.  Anticipate home with home health.  Would benefit from several walks during the day with staff assist as feel above issues due to decreased activity level..      Functional Outcome Measure:  The patient scored Total Score: 22/28 on the Tinetti outcome measure which is indicative of moderate fall risk.         Patient will benefit from skilled therapy intervention to address the above noted impairments.       PLAN :  Recommendations and Planned Interventions: transfer training and gait training      Frequency/Duration: Patient will be followed by physical therapy:  3 times a week to address goals.    Recommendation for discharge: (in order for the patient to  meet his/her long term goals)  Physical therapy at least 2 days/week in the home     This discharge recommendation:  Has been made in collaboration with the attending provider and/or case management    IF patient discharges home will need the following DME: none         SUBJECTIVE:   Patient stated "I need to do this more."    OBJECTIVE DATA SUMMARY:   HISTORY:    Past Medical History:   Diagnosis Date    Lung cancer (HCC)     Prostate cancer (HCC)     Stroke (HCC)     PT STATES, I HAD A STROKE A LONG TIME AGO     Past Surgical History:   Procedure Laterality Date    CHEST SURGERY PROCEDURE UNLISTED  04/27/2017    BRONCHOSCOPY/MEDIASTINOSCOPY    HX GI      HERNIA REPAIR    HX ORTHOPAEDIC  1994    ROD IN RIGHT LEG    IR NEPHROSTOMY PERC LT PLC CATH  SI  08/27/2018    IR NEPHROSTOMY PERC RT PLC CATH  SI  08/27/2018       Personal factors and/or comorbidities impacting plan of care:     Home Situation  Home Environment: Private residence  # Steps to Enter: 8  Rails to Enter: Yes  One/Two Story Residence: One story  Living Alone:  No  Support Systems: Friends \ neighbors  Patient Expects to be Discharged to:: Private residence  Current DME Used/Available at Home: [place], straight    EXAMINATION/PRESENTATION/DECISION MAKING:   Critical Behavior:  Neurologic State: Alert           Hearing:  Auditory  Auditory Impairment: None  Edema: RLE  Range Of Motion:  AROM: Within functional limits                       Strength:    Strength: Within functional limits         Tone & Sensation:          Sensation: Intact               Coordination:  Coordination: Within functional limits  Functional Mobility:  Bed Mobility:     Supine to Sit: Modified independent     Scooting: Modified independent  Transfers:  Sit to Stand: Modified independent  Stand to Sit: Modified independent                       Balance:   Sitting: Intact  Standing: Impaired;Without support  Standing - Static: Good  Standing - Dynamic : Good  Ambulation/Gait  Training:  Distance (ft): 120 Feet (ft)  Assistive Device: Gait belt;Other (comment)(hand hold support to none)  Ambulation - Level of Assistance: Stand-by assistance     Gait Description (WDL): Exceptions to WDL  Gait Abnormalities: Decreased step clearance              Speed/Cadence: Slow               Stairs:  Number of Stairs Trained: 8  Stairs - Level of Assistance: Contact guard assistance   Rail Use: Right   Functional Measure:  Tinetti test:    Sitting Balance: 1  Arises: 1  Attempts to Rise: 2  Immediate Standing Balance: 2  Standing Balance: 1  Nudged: 2  Eyes Closed: 1  Turn 360 Degrees - Continuous/Discontinuous: 1  Turn 360 Degrees - Steady/Unsteady: 1  Sitting Down: 1  Balance Score: 13 Balance total score  Indication of Gait: 1  R Step Length/Height: 1  L Step Length/Height: 1  R Foot Clearance: 1  L Foot Clearance: 1  Step Symmetry: 1  Step Continuity: 1  Path: 1  Trunk: 1  Walking Time: 0  Gait Score: 9 Gait total score  Total Score: 22/28 Overall total score         Tinetti Tool Score Risk of Falls  <19 = High Fall Risk  19-24 = Moderate Fall Risk  25-28 = Low Fall Risk  Tinetti ME. Performance-Oriented Assessment of Mobility Problems in Elderly Patients. JAGS 1986; [id number]. (Scoring Description: PT Bulletin Feb. 10, 1993)    Older adults: Gaines et al, 2009; n = 1000 [place] elderly evaluated with ABC, POMA, ADL, and IADL)   Mean POMA score for males aged 65-79 years = 26.21(3.40)   Mean POMA score for females age 58-79 years = 25.16(4.30)   Mean POMA score for males over 80 years = 23.29(6.02)   Mean POMA score for females over 80 years = 17.20(8.32)        Physical Therapy Evaluation Charge Determination   History Examination Presentation Decision-Making   HIGH Complexity :3+ comorbidities / personal factors will impact the outcome/ POC  LOW Complexity : 1-2 Standardized tests and measures addressing body structure, function, activity limitation and /  or participation in recreation  LOW  Complexity : Stable, uncomplicated        Based on the above components, the patient evaluation is determined to be of the following complexity level: LOW       Activity Tolerance:   Fair and signs and symptoms of orthostatic hypotension  Please refer to the flowsheet for vital signs taken during this treatment.    After treatment patient left in no apparent distress:   Call bell within reach and Side rails x 3    COMMUNICATION/EDUCATION:   The patient's plan of care was discussed with: Registered Nurse and Child psychotherapist.    Fall prevention education was provided and the patient/caregiver indicated understanding., Patient/family have participated as able in goal setting and plan of care., and Patient/family agree to work toward stated goals and plan of care.    Thank you for this referral.  Earnie GORMAN Benders, PT   Time Calculation: (P) 23 mins

## 2018-09-03 LAB — CBC WITH AUTO DIFFERENTIAL
Basophils %: 0 % (ref 0–1)
Basophils Absolute: 0 10*3/uL (ref 0.0–0.1)
Eosinophils %: 2 % (ref 0–7)
Eosinophils Absolute: 0.1 10*3/uL (ref 0.0–0.4)
Granulocyte Absolute Count: 0 10*3/uL (ref 0.00–0.04)
Hematocrit: 23.5 % — ABNORMAL LOW (ref 36.6–50.3)
Hemoglobin: 7.7 g/dL — ABNORMAL LOW (ref 12.1–17.0)
Immature Granulocytes: 0 % (ref 0.0–0.5)
Lymphocytes %: 9 % — ABNORMAL LOW (ref 12–49)
Lymphocytes Absolute: 0.6 10*3/uL — ABNORMAL LOW (ref 0.8–3.5)
MCH: 28.2 PG (ref 26.0–34.0)
MCHC: 32.8 g/dL (ref 30.0–36.5)
MCV: 86.1 FL (ref 80.0–99.0)
MPV: 9 FL (ref 8.9–12.9)
Monocytes %: 10 % (ref 5–13)
Monocytes Absolute: 0.7 10*3/uL (ref 0.0–1.0)
NRBC Absolute: 0 10*3/uL (ref 0.00–0.01)
Neutrophils %: 79 % — ABNORMAL HIGH (ref 32–75)
Neutrophils Absolute: 5.4 10*3/uL (ref 1.8–8.0)
Nucleated RBCs: 0 PER 100 WBC
Platelets: 205 10*3/uL (ref 150–400)
RBC: 2.73 M/uL — ABNORMAL LOW (ref 4.10–5.70)
RDW: 13.7 % (ref 11.5–14.5)
WBC: 6.8 10*3/uL (ref 4.1–11.1)

## 2018-09-03 LAB — BASIC METABOLIC PANEL
Anion Gap: 7 mmol/L (ref 5–15)
BUN: 24 MG/DL — ABNORMAL HIGH (ref 6–20)
Bun/Cre Ratio: 24 — ABNORMAL HIGH (ref 12–20)
CO2: 25 mmol/L (ref 21–32)
Calcium: 8.8 MG/DL (ref 8.5–10.1)
Chloride: 103 mmol/L (ref 97–108)
Creatinine: 1 MG/DL (ref 0.70–1.30)
EGFR IF NonAfrican American: >60 mL/min/{1.73_m2} (ref >60)
GFR African American: >60 mL/min/{1.73_m2} (ref >60)
Glucose: 95 mg/dL (ref 65–100)
Potassium: 4 mmol/L (ref 3.5–5.1)
Sodium: 135 mmol/L — ABNORMAL LOW (ref 136–145)

## 2018-09-03 LAB — HEMOGLOBIN AND HEMATOCRIT
Hematocrit: 24 % — ABNORMAL LOW (ref 36.6–50.3)
Hematocrit: 24 % — ABNORMAL LOW (ref 36.6–50.3)
Hemoglobin: 7.6 g/dL — ABNORMAL LOW (ref 12.1–17.0)
Hemoglobin: 7.7 g/dL — ABNORMAL LOW (ref 12.1–17.0)

## 2018-09-03 LAB — CBC WITH AUTOMATED DIFF
ABS. BASOPHILS: 0 10*3/uL (ref 0.0–0.1)
ABS. EOSINOPHILS: 0.1 10*3/uL (ref 0.0–0.4)
ABS. IMM. GRANS.: 0 10*3/uL (ref 0.00–0.04)
ABS. LYMPHOCYTES: 0.6 10*3/uL — ABNORMAL LOW (ref 0.8–3.5)
ABS. MONOCYTES: 0.7 10*3/uL (ref 0.0–1.0)
ABS. NEUTROPHILS: 5.4 10*3/uL (ref 1.8–8.0)
ABSOLUTE NRBC: 0 10*3/uL (ref 0.00–0.01)
BASOPHILS: 0 % (ref 0–1)
EOSINOPHILS: 2 % (ref 0–7)
HCT: 23.5 % — ABNORMAL LOW (ref 36.6–50.3)
HGB: 7.7 g/dL — ABNORMAL LOW (ref 12.1–17.0)
IMMATURE GRANULOCYTES: 0 % (ref 0.0–0.5)
LYMPHOCYTES: 9 % — ABNORMAL LOW (ref 12–49)
MCH: 28.2 PG (ref 26.0–34.0)
MCHC: 32.8 g/dL (ref 30.0–36.5)
MCV: 86.1 FL (ref 80.0–99.0)
MONOCYTES: 10 % (ref 5–13)
MPV: 9 FL (ref 8.9–12.9)
NEUTROPHILS: 79 % — ABNORMAL HIGH (ref 32–75)
NRBC: 0 PER 100 WBC
PLATELET: 205 10*3/uL (ref 150–400)
RBC: 2.73 M/uL — ABNORMAL LOW (ref 4.10–5.70)
RDW: 13.7 % (ref 11.5–14.5)
WBC: 6.8 10*3/uL (ref 4.1–11.1)

## 2018-09-03 LAB — METABOLIC PANEL, BASIC
Anion gap: 7 mmol/L (ref 5–15)
BUN/Creatinine ratio: 24 — ABNORMAL HIGH (ref 12–20)
BUN: 24 MG/DL — ABNORMAL HIGH (ref 6–20)
CO2: 25 mmol/L (ref 21–32)
Calcium: 8.8 MG/DL (ref 8.5–10.1)
Chloride: 103 mmol/L (ref 97–108)
Creatinine: 1 MG/DL (ref 0.70–1.30)
GFR est AA: >60 mL/min/{1.73_m2} (ref >60)
GFR est non-AA: >60 mL/min/{1.73_m2} (ref >60)
Glucose: 95 mg/dL (ref 65–100)
Potassium: 4 mmol/L (ref 3.5–5.1)
Sodium: 135 mmol/L — ABNORMAL LOW (ref 136–145)

## 2018-09-03 LAB — HGB & HCT
HCT: 24 % — ABNORMAL LOW (ref 36.6–50.3)
HCT: 24 % — ABNORMAL LOW (ref 36.6–50.3)
HGB: 7.6 g/dL — ABNORMAL LOW (ref 12.1–17.0)
HGB: 7.7 g/dL — ABNORMAL LOW (ref 12.1–17.0)

## 2018-09-03 MED FILL — PREDNISONE 5 MG TAB: 5 mg | ORAL | Qty: 1

## 2018-09-03 MED FILL — SENNA LAX 8.6 MG TABLET: 8.6 mg | ORAL | Qty: 1

## 2018-09-03 MED FILL — NORMAL SALINE FLUSH 0.9 % INJECTION SYRINGE: INTRAMUSCULAR | Qty: 10

## 2018-09-03 NOTE — Progress Notes (Signed)
 Problem: Mobility Impaired (Adult and Pediatric)  Goal: *Acute Goals and Plan of Care (Insert Text)  Description  FUNCTIONAL STATUS PRIOR TO ADMISSION: Patient was modified independent using a single point cane for functional mobility.    HOME SUPPORT PRIOR TO ADMISSION: The patient lived with family but did not require assist.    Physical Therapy Goals  Initiated 09/02/2018  1.  Patient will transfer from bed to chair and chair to bed with modified independence using the least restrictive device within 7 day(s).  2.  Patient will ambulate with modified independence for 200 feet and stable BP and HR with the least restrictive device within 7 day(s).      Outcome: Progressing Towards Goal   PHYSICAL THERAPY TREATMENT  Patient: Bruce Clark (68 y.o. male)  Date: 09/03/2018  Diagnosis: AKI (acute kidney injury) (HCC) [N17.9]   AKI (acute kidney injury) (HCC)       Precautions:    Chart, physical therapy assessment, plan of care and goals were reviewed.    ASSESSMENT  Patient continues with skilled PT services and is progressing towards goals. Patient moving well overall.  Still with some orthostasis but discussed how to manage at home and patient indicates good understanding.  Will also be monitored via home health.     Other factors to consider for discharge:          PLAN :  Patient continues to benefit from skilled intervention to address the above impairments.  Continue treatment per established plan of care.  to address goals.    Recommendation for discharge: (in order for the patient to meet his/her long term goals)  Physical therapy at least 2 days/week in the home     This discharge recommendation:  Has been made in collaboration with the attending provider and/or case management    IF patient discharges home will need the following DME: none       SUBJECTIVE:   Patient stated "I feel good."    OBJECTIVE DATA SUMMARY:   Critical Behavior:  Neurologic State: Alert     Cognition: Appropriate for age  attention/concentration  Safety/Judgement: Awareness of environment, Insight into deficits  Functional Mobility Training:  Bed Mobility:     Supine to Sit: Modified independent              Transfers:  Sit to Stand: Modified independent  Stand to Sit: Modified independent                             Balance:  Sitting: Intact  Standing: Impaired;Without support  Standing - Static: Good  Standing - Dynamic : Good  Ambulation/Gait Training:  Distance (ft): 100 Feet (ft)  Assistive Device: Gait belt  Ambulation - Level of Assistance: Supervision        Gait Abnormalities: Decreased step clearance              Speed/Cadence: Slow               Activity Tolerance:   Fair  Please refer to the flowsheet for vital signs taken during this treatment.    After treatment patient left in no apparent distress:   Call bell within reach and Caregiver / family present    COMMUNICATION/COLLABORATION:   The patient's plan of care was discussed with: Registered Nurse and Social Worker    Earnie GORMAN Benders, PT   Time Calculation: 11 mins

## 2018-09-03 NOTE — Progress Notes (Signed)
Progress  Notes by Philmore Pali, MD at 09/03/18 1004                Author: Philmore Pali, MD  Service: Nephrology  Author Type: Physician       Filed: 09/03/18 1005  Date of Service: 09/03/18 1004  Status: Addendum          Editor: Philmore Pali, MD (Physician)          Related Notes: Original Note by Philmore Pali, MD (Physician) filed at 09/03/18 1005                                                                                                 NAME: Bruce Clark         DOB:  1951/01/25         MRN:  [id number]          Assessment :     Plan:      --AKI- due to obstructive uropathy. UA is bland. No other cause to explain AKI. 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  S/p bilateral PCN's and IVF.        IV LR-> can stop now      Hgb low but holding s/p PRBC       D/W Dr. Lauro Regulus      Possible d/c later today.      Will sign off. Call us back if needed                Subjective:        Chief Complaint:  No events overnight. In good spirits. No N/V  No pain.       Review of Systems:              Symptom  Y/N  Comments    Symptom  Y/N  Comments             Fever/Chills        Chest Pain                 Poor Appetite        Edema                 Cough        Abdominal Pain         Sputum        Joint Pain         SOB/DOE        Pruritis/Rash         Nausea/vomit        Tolerating PT/OT         Diarrhea        Tolerating Diet                 Constipation        Other  Could not obtain due to:            Objective:        VITALS:    Last 24hrs VS reviewed since prior progress note. Most recent are:   Visit Vitals      BP  141/87 (BP 1 Location: Right arm, BP Patient Position: At rest)        Pulse  99     Temp  99 ??F (37.2 ??C)     Resp  20     Ht  5\' 7"  (1.702 m)     Wt  55.6 kg (122 lb 9.2 oz)     SpO2  100%        BMI  19.20 kg/m??            Intake/Output Summary (Last 24 hours) at 09/03/2018 1004   Last data filed at 09/03/2018 0859     Gross per 24 hour        Intake  922.1 ml        Output  1300 ml        Net  -377.9 ml         Telemetry Reviewed:       PHYSICAL EXAM:   General: NAD   B/L PCN-> hematuria improving   RLE edema         Lab Data Reviewed: (see below)      Medications Reviewed: (see below)      PMH/SH reviewed - no change compared to H&P   ________________________________________________________________________   Care Plan discussed with:       Patient  Y           Family              RN             Care Manager                            Consultant:                     Comments         >50% of visit spent in counseling and coordination of care            ________________________________________________________________________   Deshawnda Acrey Sherrye Payor, MD       Procedures: see electronic medical records for all procedures/Xrays and details which   were not copied into this note but were reviewed prior to creation of Plan.        LABS:     Recent Labs              09/03/18   0608  09/03/18   0607    09/02/18   0612     WBC   --   6.8   --   6.7     HGB  7.7*  7.7*    < >  9.0*     HCT  24.0*  23.5*    < >  28.0*     PLT   --   205   --   255        < > = values in this interval not displayed.          Recent Labs             09/03/18   [phone]  09/02/18   0612  09/01/18   7564  NA  135*  135*  138     K  4.0  4.3  3.2*     CL  103  102  103     CO2  25  27  26      BUN  24*  25*  28*     CREA  1.00  1.28  1.42*     GLU  95  90  97     CA  8.8  8.8  8.7     MG   --   1.7  1.6          PHOS   --   2.0*  2.1*          Recent Labs            09/02/18   0612  09/01/18   0611     SGOT  26  20     AP  75  66     TP  7.5  7.1     ALB  2.6*  2.2*         GLOB  4.9*  4.9*        No results for input(s): INR, PTP, APTT, INREXT, INREXT in the last 72 hours.    No results for input(s): FE, TIBC, PSAT, FERR in the last 72 hours.    No results found for: FOL,  RBCF    No results for input(s): PH, PCO2, PO2 in the last 72 hours.   No results for input(s): CPK, CKMB in the last 72 hours.      No lab exists for component: TROPONINI   No components found for: [facility]     Lab Results         Component  Value  Date/Time            Color  YELLOW/STRAW  08/26/2018 02:25 PM       Appearance  CLEAR  08/26/2018 02:25 PM       Specific gravity  1.012  08/26/2018 02:25 PM       Specific gravity  1.025  04/22/2016 08:22 PM       pH (UA)  5.0  08/26/2018 02:25 PM       Protein  NEGATIVE   08/26/2018 02:25 PM       Glucose  NEGATIVE   08/26/2018 02:25 PM       Ketone  NEGATIVE   08/26/2018 02:25 PM       Bilirubin  NEGATIVE   08/26/2018 02:25 PM       Urobilinogen  0.2  08/26/2018 02:25 PM       Nitrites  NEGATIVE   08/26/2018 02:25 PM       Leukocyte Esterase  NEGATIVE   08/26/2018 02:25 PM       Epithelial cells  FEW  08/26/2018 02:25 PM       Bacteria  NEGATIVE   08/26/2018 02:25 PM       WBC  0-4  08/26/2018 02:25 PM            RBC  0-5  08/26/2018 02:25 PM           MEDICATIONS:     Current Facility-Administered Medications          Medication  Dose  Route  Frequency           ?  0.9% sodium chloride infusion 250 mL   250 mL  IntraVENous  PRN     ?  fentaNYL citrate (PF) injection 100 mcg   100 mcg  IntraVENous  Multiple     ?  acetaminophen (TYLENOL) tablet 650 mg   650 mg  Oral  Q4H PRN     ?  predniSONE (DELTASONE) tablet 5 mg   5 mg  Oral  DAILY     ?  senna (SENOKOT) tablet 8.6 mg   1 Tab  Oral  QHS     ?  sodium chloride (NS) flush 5-40 mL   5-40 mL  IntraVENous  Q8H     ?  sodium chloride (NS) flush 5-40 mL   5-40 mL  IntraVENous  PRN     ?  LORazepam (ATIVAN) tablet 0.5 mg   0.5 mg  Oral  BID PRN     ?  oxyCODONE IR (ROXICODONE) tablet 5-10 mg   5-10 mg  Oral  Q4H PRN           ?  ondansetron (ZOFRAN) injection 4 mg   4 mg  IntraVENous  Q4H PRN

## 2018-09-03 NOTE — Discharge Summary (Signed)
Discharge Summary       PATIENT ID: Bruce Clark  MRN: [id number]   DATE OF BIRTH: April 01, 1951    DATE OF ADMISSION: 08/26/2018  1:21 PM    DATE OF DISCHARGE: 09/03/18     PRIMARY CARE PROVIDER: Juliene Pina, MD     ATTENDING PHYSICIAN: Carolee Rota, MD    DISCHARGING PROVIDER: Carolee Rota, MD    To contact this individual call [phone] and ask the operator to page.  If unavailable ask to be transferred the Adult Hospitalist Department.    CONSULTATIONS: IP CONSULT TO HOSPITALIST  IP CONSULT TO UROLOGY  IP CONSULT TO NEPHROLOGY  IP CONSULT TO INTERVENTIONAL RADIOLOGY    PROCEDURES/SURGERIES: * No surgery found *    [place] COURSE:   This is a 68 year old black male with a significant medical history of widely metastatic adenocarcinoma of the prostate and newly diagnosed lung adenocarcinoma went to oncology for follow-up on 08/26/2018.  Patient reported not feeling well and his creatinine was found to be significantly elevated, around 9.  He was then referred to the ER for further evaluation and management.  CAT scan of the abdomen pelvis showed bilateral ureteral distention new on the right increased on the left causing significant obstructive uropathy.  P hypoattenuating lesions in the liver reportedly increased in size.  Bilateral percutaneous nephrostomies were placed and kidney function progressively improved and his creatinine was 1 on the day of discharge compared to creatinine of 8.71 on admission.  Hematuria cleared.  During this hospitalization, patient's hemoglobin dropped and he required transfusion.  Patient was on chronic anticoagulation with apixaban.  He has significantly swollen right lower extremity and venous Doppler showed DVT, he was put on heparin which had to be stopped due to bleeding.  He was sent for IVC filter however placement was unsuccessful due to the IVC anatomy.  Oncology opined that risk of PE is low due to  obstructed IVC and recommended stay off anticoagulation due to patient's apixaban was discontinued on discharge.  Patient progressed well as far as mobility, physical therapy cleared him for home discharge and he is discharged home in stable condition.  This Probation officer has discussed with his oncologist who will take care of the fractures nephrostomy with her he needs urology evaluation for replacement, internal stents etc.          DISCHARGE DIAGNOSES / PLAN:      Severe AKI due to obstructive uropathy, resolved with placement of bilateral PCN.  Acute on chronic anemia,acute drop due likely to hematuria that was PTA  -transfused.Monitor.HB mid 7.  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  [address]  [place] [address]  [phone]               DIET: Regular Diet    ACTIVITY: Activity as tolerated    WOUND CARE: Nephrostomy tube care    EQUIPMENT needed: Own      DISCHARGE MEDICATIONS:  Current Discharge Medication List      CONTINUE these medications which have NOT CHANGED    Details   oxyCODONE-acetaminophen (PERCOCET) 5-325 mg per tablet Take 1 Tab by mouth every four (4) hours as needed for Pain.       predniSONE (DELTASONE) 5 mg tablet Take 1 Tab by mouth daily. Restart on Jun 01, 2017  Qty: 30 Tab, Refills: 6    Associated Diagnoses: Prostate cancer ([place])      abiraterone (ZYTIGA) 500 mg tab Take 1,000 mg by mouth daily.  Qty: 60 Tab, Refills: 3    Associated Diagnoses: Prostate cancer ([place])         STOP taking these medications       apixaban (ELIQUIS) 5 mg tablet Comments:   Reason for Stopping:         senna (SENOKOT) 8.6 mg tablet Comments:   Reason for Stopping:                 NOTIFY YOUR PHYSICIAN FOR ANY OF THE FOLLOWING:   Fever over 101 degrees for 24 hours.   Chest pain, shortness of breath, fever, chills, nausea, vomiting, diarrhea, change in mentation, falling, weakness, bleeding. Severe pain or pain not relieved by medications.  Or, any other signs or symptoms that you may have questions about.    DISPOSITION: Home with home health services    Home With:   OT  PT  HH  RN       Long term SNF/Inpatient Rehab    Independent/assisted living    Hospice    Other:       PATIENT CONDITION AT DISCHARGE:     Functional status: Independent   Poor     Deconditioned     Independent      Cognition: Lucid    Lucid     Forgetful     Dementia      Catheters/lines (plus indication): Bilateral percutaneous nephrostomy   Foley     PICC     PEG     None      Code status: DNR    Full code    x DNR      PHYSICAL EXAMINATION AT DISCHARGE:  General:          Alert, cooperative, no distress, appears stated age.     HEENT:           Atraumatic, anicteric sclerae, pink conjunctivae                          No oral ulcers, mucosa moist, throat clear, dentition fair  Neck:               Supple, symmetrical  Lungs:             Clear to auscultation bilaterally.  No Wheezing or Rhonchi. No rales.  Chest wall:      No tenderness  No Accessory muscle use.  Heart:              Regular  rhythm,  No  murmur   No edema  Abdomen:        Soft, non-tender. Not distended.  Bowel sounds normal      Bilateral nephrostomy tubes present, draining clear urine.  Extremities:     No cyanosis.  No clubbing,                            Skin turgor normal, Capillary refill normal  Skin:                Not pale.  Not Jaundiced  No rashes   Psych:             Not anxious or agitated.  Neurologic:      Alert, moves all extremities, answers questions appropriately and responds to commands       CHRONIC MEDICAL DIAGNOSES:  Problem List as of 09/03/2018 Date Reviewed: 09-Sep-2018          Codes Class Noted - Resolved    Liver lesion ICD-10-CM: K76.9  ICD-9-CM: 573.8  2018-09-09 - Present        * (Principal) AKI (acute kidney injury) ([place]) ICD-10-CM: N17.9  ICD-9-CM: 584.9  08/26/2018 - Present        Lung cancer ([place]) ICD-10-CM: C34.90  ICD-9-CM: 162.9  04/27/2017 - Present        Malignant neoplasm of right lung ([place]) ICD-10-CM: C34.91  ICD-9-CM: 162.9  03/10/2017 - Present        Bone metastasis ([place]) ICD-10-CM: C79.51  ICD-9-CM: 198.5  06/27/2016 - Present        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 [place] application to patient at hospital discharge                 Prostate cancer [facility]) ICD-10-CM: C61  ICD-9-CM: 185  05/09/2016 - Present        Deep venous thrombosis ([place]) ICD-10-CM: I82.409  ICD-9-CM: 453.40  05/09/2016 - Present        DVT (deep venous thrombosis) ([place]) ICD-10-CM: I82.409  ICD-9-CM: 453.40  05/01/2016 - Present        Lacunar stroke ([place]) ICD-10-CM: I63.81  ICD-9-CM: 434.91  05/01/2016 - Present         Metastasis to bone of unknown primary [facility]) ICD-10-CM: C79.51, C80.1  ICD-9-CM: 198.5, 199.1  05/01/2016 - Present        RESOLVED: Pneumonia ICD-10-CM: J18.9  ICD-9-CM: 295  04/22/2016 - 05/01/2016              Greater than 30 minutes were spent with the patient on counseling and coordination of care    Signed:   MWUXLKG Sonnie Alamo, MD  09/03/2018  12:27 PM

## 2018-09-03 NOTE — Progress Notes (Addendum)
TOC  -Ready for discharge  -Referral sent to [facility], Accepted  -Family will transport at discharge.     CM met with patient and family member to discuss home health options. CM offered [facility] of Choice and provided list with ratings. Patient and family advised that they are interested in [place]. CM sent referral via allscripts to [facility]. CM received confirmation patient has been accepted.     CM will follow     Gerarda Gunther, MHA/CRM

## 2018-09-03 NOTE — Progress Notes (Addendum)
NAME: Bruce Clark        DOB:  [birth year]        MRN:  [id number]        Assessment :    Plan:  --AKI- due to obstructive uropathy. UA is bland. No other cause to explain AKI. Basln Cr normal in Nov 2019  Hyponatremia  Obstructive Uropathy- likely due to prostate cancer  Metastatic Prostate and Lung Cancer  Liver mets  HTN --Creatinine continues to improve to 1.0mg /dl.  S/p bilateral PCN's and IVF.      IV LR-> can stop now    Hgb low but holding s/p PRBC     D/W Dr. Lauro Regulus    Possible d/c later today.    Will sign off. Call us back if needed         Subjective:     Chief Complaint:  No events overnight. In good spirits. No N/V  No pain.     Review of Systems:    Symptom Y/N Comments  Symptom Y/N Comments   Fever/Chills    Chest Pain     Poor Appetite    Edema     Cough    Abdominal Pain     Sputum    Joint Pain     SOB/DOE    Pruritis/Rash     Nausea/vomit    Tolerating PT/OT     Diarrhea    Tolerating Diet     Constipation    Other       Could not obtain due to:      Objective:     VITALS:   Last 24hrs VS reviewed since prior progress note. Most recent are:  Visit Vitals  BP 141/87 (BP 1 Location: Right arm, BP Patient Position: At rest)   Pulse 99   Temp 99 ??F (37.2 ??C)   Resp 20   Ht 5\' 7"  (1.702 m)   Wt 55.6 kg (122 lb 9.2 oz)   SpO2 100%   BMI 19.20 kg/m??       Intake/Output Summary (Last 24 hours) at 09/03/2018 1004  Last data filed at 09/03/2018 0859  Gross per 24 hour   Intake 922.1 ml   Output 1300 ml   Net -377.9 ml      Telemetry Reviewed:     PHYSICAL EXAM:  General: NAD  B/L PCN-> hematuria improving  RLE edema      Lab Data Reviewed: (see below)    Medications Reviewed: (see below)    PMH/SH reviewed - no change compared to H&P  ________________________________________________________________________  Care Plan discussed with:  Patient Y    Family      RN     Care Manager                    Consultant:          Comments    >50% of visit spent in counseling and coordination of care       ________________________________________________________________________  Martia Dalby Sherrye Payor, MD     Procedures: see electronic medical records for all procedures/Xrays and details which  were not copied into this note but were reviewed prior to creation of Plan.      LABS:  Recent Labs     09/03/18  0608 09/03/18  0607  09/02/18  0612   WBC  --  6.8  --  6.7   HGB 7.7* 7.7*   < > 9.0*   HCT 24.0* 23.5*   < >  28.0*   PLT  --  205  --  255    < > = values in this interval not displayed.     Recent Labs     09/03/18  0607 09/02/18  0612 09/01/18  0611   NA 135* 135* 138   K 4.0 4.3 3.2*   CL 103 102 103   CO2 25 27 26    BUN 24* 25* 28*   CREA 1.00 1.28 1.42*   GLU 95 90 97   CA 8.8 8.8 8.7   MG  --  1.7 1.6   PHOS  --  2.0* 2.1*     Recent Labs     09/02/18  0612 09/01/18  0611   SGOT 26 20   AP 75 66   TP 7.5 7.1   ALB 2.6* 2.2*   GLOB 4.9* 4.9*     No results for input(s): INR, PTP, APTT, INREXT, INREXT in the last 72 hours.   No results for input(s): FE, TIBC, PSAT, FERR in the last 72 hours.   No results found for: FOL, RBCF   No results for input(s): PH, PCO2, PO2 in the last 72 hours.  No results for input(s): CPK, CKMB in the last 72 hours.    No lab exists for component: TROPONINI  No components found for: [facility]  Lab Results   Component Value Date/Time    Color YELLOW/STRAW 08/26/2018 02:25 PM    Appearance CLEAR 08/26/2018 02:25 PM    Specific gravity 1.012 08/26/2018 02:25 PM    Specific gravity 1.025 04/22/2016 08:22 PM    pH (UA) 5.0 08/26/2018 02:25 PM    Protein NEGATIVE  08/26/2018 02:25 PM    Glucose NEGATIVE  08/26/2018 02:25 PM    Ketone NEGATIVE  08/26/2018 02:25 PM    Bilirubin NEGATIVE  08/26/2018 02:25 PM    Urobilinogen 0.2 08/26/2018 02:25 PM    Nitrites NEGATIVE  08/26/2018 02:25 PM    Leukocyte Esterase NEGATIVE  08/26/2018 02:25 PM    Epithelial cells FEW 08/26/2018 02:25 PM    Bacteria NEGATIVE  08/26/2018 02:25 PM     WBC 0-4 08/26/2018 02:25 PM    RBC 0-5 08/26/2018 02:25 PM       MEDICATIONS:  Current Facility-Administered Medications   Medication Dose Route Frequency   ??? 0.9% sodium chloride infusion 250 mL  250 mL IntraVENous PRN   ??? fentaNYL citrate (PF) injection 100 mcg  100 mcg IntraVENous Multiple   ??? acetaminophen (TYLENOL) tablet 650 mg  650 mg Oral Q4H PRN   ??? predniSONE (DELTASONE) tablet 5 mg  5 mg Oral DAILY   ??? senna (SENOKOT) tablet 8.6 mg  1 Tab Oral QHS   ??? sodium chloride (NS) flush 5-40 mL  5-40 mL IntraVENous Q8H   ??? sodium chloride (NS) flush 5-40 mL  5-40 mL IntraVENous PRN   ??? LORazepam (ATIVAN) tablet 0.5 mg  0.5 mg Oral BID PRN   ??? oxyCODONE IR (ROXICODONE) tablet 5-10 mg  5-10 mg Oral Q4H PRN   ??? ondansetron (ZOFRAN) injection 4 mg  4 mg IntraVENous Q4H PRN

## 2018-09-03 NOTE — Progress Notes (Signed)
He is feeling better and has no complaints.  ??  Both nephrostomy tubes are draining clear yellow. Foley removed, states he is voiding independently with no complaints.  Urine is clearing.     Temp: 99, VSS  Hgb: 7.7  Creatinine 1.0

## 2018-09-03 NOTE — Telephone Encounter (Signed)
Oncology Nurse [facility]   Phone: [phone]    Did not receive agreed on call from [place] phoned her  "I am in the car with Hersey on the way home,but I have your phone number"    She agreed to expect and accept follow up call from me on 1/20   States she and Abdulahi have "what we need now"  They anticipate [facility] nursing and PT visits ([place])  Confirmed her understanding of appt with Dr. Jamie Kato on 1/22     [name] [facility] Line

## 2018-09-03 NOTE — Progress Notes (Signed)
Problem: Mobility Impaired (Adult and Pediatric)  Goal: *Acute Goals and Plan of Care (Insert Text)  Description  FUNCTIONAL STATUS PRIOR TO ADMISSION: Patient was modified independent using a single point cane for functional mobility.    HOME SUPPORT PRIOR TO ADMISSION: The patient lived with family but did not require assist.    Physical Therapy Goals  Initiated 09/02/2018  1.  Patient will transfer from bed to chair and chair to bed with modified independence using the least restrictive device within 7 day(s).  2.  Patient will ambulate with modified independence for 200 feet and stable BP and HR with the least restrictive device within 7 day(s).      Outcome: Progressing Towards Goal   PHYSICAL THERAPY TREATMENT  Patient: Bruce Clark (68 y.o. male)  Date: 09/03/2018  Diagnosis: AKI (acute kidney injury) ([place]) [N17.9]   AKI (acute kidney injury) ([place])       Precautions:    Chart, physical therapy assessment, plan of care and goals were reviewed.    ASSESSMENT  Patient continues with skilled PT services and is progressing towards goals. Patient moving well overall.  Still with some orthostasis but discussed how to manage at home and patient indicates good understanding.  Will also be monitored via home health.     Other factors to consider for discharge:          PLAN :  Patient continues to benefit from skilled intervention to address the above impairments.  Continue treatment per established plan of care.  to address goals.    Recommendation for discharge: (in order for the patient to meet his/her long term goals)  Physical therapy at least 2 days/week in the home     This discharge recommendation:  Has been made in collaboration with the attending provider and/or case management    IF patient discharges home will need the following DME: none       SUBJECTIVE:   Patient stated ???I feel good.???    OBJECTIVE DATA SUMMARY:   Critical Behavior:  Neurologic State: Alert      Cognition: Appropriate for age attention/concentration  Safety/Judgement: Awareness of environment, Insight into deficits  Functional Mobility Training:  Bed Mobility:     Supine to Sit: Modified independent              Transfers:  Sit to Stand: Modified independent  Stand to Sit: Modified independent                             Balance:  Sitting: Intact  Standing: Impaired;Without support  Standing - Static: Good  Standing - Dynamic : Good  Ambulation/Gait Training:  Distance (ft): 100 Feet (ft)  Assistive Device: Gait belt  Ambulation - Level of Assistance: Supervision        Gait Abnormalities: Decreased step clearance              Speed/Cadence: Slow               Activity Tolerance:   Fair  Please refer to the flowsheet for vital signs taken during this treatment.    After treatment patient left in no apparent distress:   Call bell within reach and Caregiver / family present    COMMUNICATION/COLLABORATION:   The patient???s plan of care was discussed with: Registered Nurse and Social Worker    Beatrice Lecher, PT   Time Calculation: 11 mins

## 2018-09-03 NOTE — Progress Notes (Signed)
I have reviewed discharge instructions with the patient.  The patient verbalized understanding.

## 2018-09-03 NOTE — Progress Notes (Signed)
I have reviewed discharge instructions with the patient.  The patient verbalized understanding.

## 2018-09-03 NOTE — Discharge Summary (Signed)
Discharge Summary by Andris Baumann T, MD at 09/03/18 1227                Author: Carolee Rota, MD  Service: Internal Medicine  Author Type: Physician       Filed: 09/03/18 1748  Date of Service: 09/03/18 1227  Status: Signed          Editor: Carolee Rota, MD (Physician)                       Discharge Summary           PATIENT ID: Bruce Clark   MRN: [id number]    DATE OF BIRTH: 1951-08-03     DATE OF ADMISSION: 08/26/2018  1:21 PM     DATE OF DISCHARGE: 09/03/18       PRIMARY CARE PROVIDER: Juliene Pina, MD        ATTENDING PHYSICIAN: Carolee Rota, MD      DISCHARGING PROVIDER: Carolee Rota, MD     To contact this individual call [phone] and ask the operator to page.  If unavailable ask to be transferred the Adult Hospitalist Department.      CONSULTATIONS: IP CONSULT TO HOSPITALIST   IP CONSULT TO UROLOGY   IP CONSULT TO NEPHROLOGY   IP CONSULT TO INTERVENTIONAL RADIOLOGY      PROCEDURES/SURGERIES: * No surgery found *      [place] COURSE:    This is a 68 year old black male with a significant medical history of widely metastatic adenocarcinoma of the prostate and newly diagnosed lung adenocarcinoma went to oncology for follow-up on 08/26/2018.  Patient reported not feeling well and his creatinine  was found to be significantly elevated, around 9.  He was then referred to the ER for further evaluation and management.  CAT scan of the abdomen pelvis showed bilateral ureteral distention new on the right increased on the left causing significant obstructive  uropathy.  P hypoattenuating lesions in the liver reportedly increased in size.  Bilateral percutaneous nephrostomies were placed and kidney function progressively improved and his creatinine was 1 on the day of discharge compared to creatinine of 8.71  on admission.  Hematuria cleared.  During this hospitalization, patient's hemoglobin dropped and he required transfusion.  Patient was on chronic anticoagulation  with apixaban.  He has significantly swollen right lower extremity and venous Doppler showed  DVT, he was put on heparin which had to be stopped due to bleeding.  He was sent for IVC filter however placement was unsuccessful due to the IVC anatomy.  Oncology opined that risk of PE is low due to obstructed IVC and recommended stay off anticoagulation  due to patient's apixaban was discontinued on discharge.   Patient progressed well as far as mobility, physical therapy cleared him for home discharge and he is discharged home in stable condition.  This Probation officer has discussed with his oncologist who will take care of the fractures nephrostomy with her he needs  urology evaluation for replacement, internal stents etc.                 DISCHARGE DIAGNOSES / PLAN:           Severe AKI due to obstructive uropathy, resolved with placement of bilateral PCN.   Acute on chronic anemia,acute drop due likely to hematuria that was PTA   -transfused.Monitor.HB mid 7.  CBC next week when he goes to his oncologist office  Orthostatic hypotension and tachycardia due to acute debility?some degree of hypovolemia   -Resolved   DVT -off anticoagulation due to bleeding and persistent risk of serious bleeding       Right lung adenocarcinoma   -S/p RLL lobectomy   +liver mets   Oncology following.Pallative keytruda scheduled for 09/08/18       Stage IV prostate ca with bone mets s/p XRT               Chronic Severe Protein-Calorie Malnutrition              Code status: DNR                        PENDING TEST RESULTS:    At the time of discharge the following test results are still pending: None      FOLLOW UP APPOINTMENTS:       Follow-up Information               Follow up With  Specialties  Details  Why  Contact Info              Juliene Pina, MD  Hematology and Oncology, Internal Medicine, Hematology, Oncology      [address]   [address]   [place] [address]   [phone]                       DIET: Regular Diet      ACTIVITY:  Activity as tolerated      WOUND CARE: Nephrostomy tube care      EQUIPMENT needed: Own         DISCHARGE MEDICATIONS:     Current Discharge Medication List              CONTINUE these medications which have NOT CHANGED          Details        oxyCODONE-acetaminophen (PERCOCET) 5-325 mg per tablet  Take 1 Tab by mouth every four (4) hours as needed for Pain.               predniSONE (DELTASONE) 5 mg tablet  Take 1 Tab by mouth daily. Restart on Jun 01, 2017   Qty: 30 Tab, Refills:  6          Associated Diagnoses: Prostate cancer ([place])               abiraterone (ZYTIGA) 500 mg tab  Take 1,000 mg by mouth daily.   Qty: 60 Tab, Refills:  3          Associated Diagnoses: Prostate cancer ([place])                     STOP taking these medications                  apixaban (ELIQUIS) 5 mg tablet  Comments:    Reason for Stopping:                      senna (SENOKOT) 8.6 mg tablet  Comments:    Reason for Stopping:                                NOTIFY YOUR PHYSICIAN FOR ANY OF THE FOLLOWING:    Fever over 101 degrees for 24 hours.  Chest pain, shortness of breath, fever, chills, nausea, vomiting, diarrhea, change in mentation, falling, weakness, bleeding. Severe pain or pain not relieved by medications.   Or, any other signs or symptoms that you may have questions about.      DISPOSITION: Home with home health services         Home With:     OT    PT    HH    RN                   Long term SNF/Inpatient Rehab       Independent/assisted living          Hospice          Other:           PATIENT CONDITION AT DISCHARGE:       Functional status: Independent        Poor        Deconditioned           Independent         Cognition: Lucid         Lucid        Forgetful           Dementia         Catheters/lines (plus indication): Bilateral percutaneous nephrostomy        Foley        PICC        PEG           None         Code status: DNR         Full code         x  DNR         PHYSICAL EXAMINATION AT DISCHARGE:   General:           Alert, cooperative, no distress, appears stated age.      HEENT:           Atraumatic, anicteric sclerae, pink conjunctivae                           No oral ulcers, mucosa moist, throat clear, dentition fair   Neck:               Supple, symmetrical   Lungs:             Clear to auscultation bilaterally.  No Wheezing or Rhonchi. No rales.   Chest wall:      No tenderness  No Accessory muscle use.   Heart:              Regular  rhythm,  No  murmur   No edema   Abdomen:        Soft, non-tender. Not distended.  Bowel sounds normal      Bilateral nephrostomy tubes present, draining clear urine.   Extremities:     No cyanosis.  No clubbing,                             Skin turgor normal, Capillary refill normal   Skin:                Not pale.  Not Jaundiced  No rashes    Psych:             Not anxious or agitated.   Neurologic:  Alert, moves all extremities, answers questions appropriately and responds to commands          CHRONIC MEDICAL DIAGNOSES:      Problem List as of 09/03/2018  Date Reviewed:  09/12/2018                        Codes  Class  Noted - Resolved             Liver lesion  ICD-10-CM: K76.9   ICD-9-CM: 573.8    September 12, 2018 - Present                       * (Principal) AKI (acute kidney injury) ([place])  ICD-10-CM: N17.9   ICD-9-CM: 584.9    08/26/2018 - Present                       Lung cancer ([place])  ICD-10-CM: C34.90   ICD-9-CM: 162.9    04/27/2017 - Present                       Malignant neoplasm of right lung ([place])  ICD-10-CM: C34.91   ICD-9-CM: 162.9    03/10/2017 - Present                       Bone metastasis ([place])  ICD-10-CM: C79.51   ICD-9-CM: 198.5    06/27/2016 - Present                       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 Movico application to patient at hospital discharge                                         Prostate cancer [facility])  ICD-10-CM: C61   ICD-9-CM: 185    05/09/2016 - Present                       Deep venous thrombosis (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

## 2018-09-03 NOTE — Progress Notes (Signed)
TOC  -Ready for discharge  -Referral sent to [facility], Accepted  -Family will transport at discharge.     CM met with patient and family member to discuss home health options. CM offered [facility] of Choice and provided list with ratings. Patient and family advised that they are interested in [facility]. CM sent referral via allscripts to [facility]. CM received confirmation patient has been accepted.     CM will follow     Pierce Crane, MHA/CRM

## 2018-09-06 ENCOUNTER — Telehealth

## 2018-09-06 MED ORDER — OXYCODONE-ACETAMINOPHEN 10 MG-325 MG TAB
10-325 mg | ORAL_TABLET | ORAL | 0 refills | Status: AC | PRN
Start: 2018-09-06 — End: 2018-10-06

## 2018-09-06 MED FILL — pembrolizumab (KEYTRUDA) 200 mg in 0.9% sodium chloride 100 mL, overfill volume 10 mL IVPB

## 2018-09-06 NOTE — Telephone Encounter (Signed)
Oncology Nurse [facility]   Phone: [phone]    Phoned Bruce Clark.  Purpose of call:  Follow up post discharge 1/17.  Bruce Clark agreed on/expects this call.      Bruce Clark says "Well, Bruce Clark is doing pretty good, he's sitting up right now."  They expect Bruce Clark nursing visit today between 6 and 12.  "Look here, how Bruce will he have these drains in?"    States she feels she can provide needed care to bilat PCN and glad nurse will assess/show her how at today's visit.    States they have what they need at home.     Describes vague recall/understanding of Bruce Clark plan for treatment.      Identifies transportation as a frequent barrier to care "if my grandson can't drive Bruce Clark."  Describes inability to pay OOP for Bruce Clark drivers -- Bruce Clark has done this in the past, some times paying around $100/week when Bruce Clark was receiving RT.    They are aware of appt on 1/22 but have no ride.    We agreed on the following:  1.  Referral to [facility] for transportation -- hope for volunteer driver but [facility] will cover cost of 10 annual RTs to medical appts.  ONN will schedule transportation for 1/22 and call Bruce Clark to confirm    2.  Assured her of time to discuss treatment plan and ask all questions at appt with Bruce Clark on 1/22    3.  ONN will meet with Bruce Clark and Bruce Clark on 1/22 to assess other needs    Plan:   ? Concern about care needs at home and patient and family's understanding of next steps/palliative treatment  ? Consider referral to Bruce Clark -- receiving palliative treatment for two progressed/advanced malignancies.    ? Ongoing assessment for barriers to care and direct to transportation resources  ? Request HH PT eval -- recommended for discharge by inpatient PT  ? Assess if Bruce Clark is underinsured -- he has Medicare A, B, D only.    ? Discuss with Bruce Birmingham, Bruce Clark, with Bruce Clark team    Bruce Meyer MS RN AOCNS

## 2018-09-06 NOTE — Telephone Encounter (Signed)
Called [facility] back. No answer and unable to leave voicemail as it is full. Will try again later.

## 2018-09-06 NOTE — Telephone Encounter (Signed)
Bruce Clark with [facility] - called to follow up regarding 8/10 pain in bilateral nephrostomy tube placement. Patient received oxycodone for pain but currently out. Started taking tylenol with minimal relief. Please advise regarding pain management    CB [phone]

## 2018-09-06 NOTE — Telephone Encounter (Signed)
Oncology Nurse [facility]   Phone: [phone]    Call to [name] --    Apprised her that I've registered [place] in the [employer] system and identified [name] as his POC.    Directed [name] to call [facility] at [phone] to confirm [place] transportation for appt at [facility] and Dr. Melony Overly) on 1/22.      She agrees to call [facility] today to confirm this trip.    Briefly explained that with 72hr notice of appts and based on availability of volunteer drivers, ACS may help [place] with free trips to medical appts through the [place] program.  ACS's funding for alternate transportation (through [place]) is only available for 10 RTs (lifetime, non-renewable).    She tells me that their residence is not on the [facility] route.  However, I want to discuss the [facility]'s "ADA Ride" (paratransit) program with [name] and [name] on 1/22.  ONN has called to request info about app process and route.  If they can use family or a service like [name] to travel short distance to [facility] route, this may be a reliable and sustainable plan for transportation.     Plan to also explore and share with them senior resources through [employer].    She was appreciative of transport through [facility] on 1/22.      [name] MS RN AOCNS

## 2018-09-06 NOTE — Telephone Encounter (Signed)
Received phone call from home health nurse. States patient ran out of percocet and taking tylenol as needed for pain around nephrostomy tube site as well as new "blister like clusters" around umbilicus and right flank which he rates 8/10 pain. Delsa Sale states pt says this developed post discharge. She also states pt has passed some blood clots when urinating. Encouraged increase hydration to 2L a day. Confirmed he is not taking Eliquis.     Will sent refill for percocet as patient is out. Will call Jen back tomorrow after discussing with Dr. Melony Overly

## 2018-09-07 MED ORDER — VALACYCLOVIR 1 G TAB
1 gram | ORAL_TABLET | Freq: Three times a day (TID) | ORAL | 0 refills | Status: AC
Start: 2018-09-07 — End: 2018-09-14

## 2018-09-07 NOTE — Telephone Encounter (Signed)
D/w Dr. Melony Overly. Will treat blister areas as shingles with Valtrex 3 times daily x 7 days.     Bruce Clark--please call nurse back and let her know plan and call [name] and ask her to pick up. He needs to start this TODAY since he has office apt tomorrow. I also sent pain medication to [facility] as well for him.

## 2018-09-07 NOTE — Addendum Note (Signed)
Addended byMare Loan on: 09/07/2018 08:20 AM     Modules accepted: Orders

## 2018-09-07 NOTE — Telephone Encounter (Signed)
[name]; no answer and unable to leave voicemail    Bruce Clark; PHI confirmed x2 identifiers   Informed that patient will be treated with Valtrex and refill for pain medication has been sent to pharmacy    Reviewed frequency of Valtrex and stated patient needs to start medication TODAY   [name] verbalized understanding   No new questions or concerns at this time

## 2018-09-07 NOTE — Addendum Note (Signed)
Addendum  Note by Mare Loan, NP at 09/07/18 0820                Author: Mare Loan, NP  Service: --  Author Type: Nurse Practitioner       Filed: 09/07/18 0820  Encounter Date: 09/06/2018  Status: Signed          Editor: Mare Loan, NP (Nurse Practitioner)          Addended by: Mare Loan on: 09/07/2018 08:20 AM    Modules accepted: Orders

## 2018-09-08 ENCOUNTER — Ambulatory Visit: Admit: 2018-09-08 | Discharge: 2018-09-08 | Payer: MEDICARE | Attending: Registered Nurse | Primary: Internal Medicine

## 2018-09-08 ENCOUNTER — Encounter: Attending: Registered Nurse | Primary: Internal Medicine

## 2018-09-08 ENCOUNTER — Inpatient Hospital Stay: Admit: 2018-09-08 | Payer: MEDICARE | Primary: Internal Medicine

## 2018-09-08 ENCOUNTER — Ambulatory Visit: Attending: Registered Nurse | Primary: Internal Medicine

## 2018-09-08 DIAGNOSIS — C3431 Malignant neoplasm of lower lobe, right bronchus or lung: Secondary | ICD-10-CM

## 2018-09-08 LAB — COMPREHENSIVE METABOLIC PANEL
ALT: 12 U/L (ref 12–78)
AST: 46 U/L — ABNORMAL HIGH (ref 15–37)
Albumin/Globulin Ratio: 0.5 — ABNORMAL LOW (ref 1.1–2.2)
Albumin: 2.7 g/dL — ABNORMAL LOW (ref 3.5–5.0)
Alkaline Phosphatase: 80 U/L (ref 45–117)
Anion Gap: 10 mmol/L (ref 5–15)
BUN: 13 MG/DL (ref 6–20)
Bun/Cre Ratio: 11 — ABNORMAL LOW (ref 12–20)
CO2: 25 mmol/L (ref 21–32)
Calcium: 8.5 MG/DL (ref 8.5–10.1)
Chloride: 98 mmol/L (ref 97–108)
Creatinine: 1.14 MG/DL (ref 0.70–1.30)
EGFR IF NonAfrican American: >60 mL/min/{1.73_m2} (ref >60)
GFR African American: >60 mL/min/{1.73_m2} (ref >60)
Globulin: 5.2 g/dL — ABNORMAL HIGH (ref 2.0–4.0)
Glucose: 74 mg/dL (ref 65–100)
Potassium: 4.7 mmol/L (ref 3.5–5.1)
Sodium: 133 mmol/L — ABNORMAL LOW (ref 136–145)
Total Bilirubin: 0.5 MG/DL (ref 0.2–1.0)
Total Protein: 7.9 g/dL (ref 6.4–8.2)

## 2018-09-08 LAB — CBC WITH AUTO DIFFERENTIAL
Basophils %: 0 % (ref 0–1)
Basophils Absolute: 0 10*3/uL (ref 0.0–0.1)
Eosinophils %: 1 % (ref 0–7)
Eosinophils Absolute: 0.1 10*3/uL (ref 0.0–0.4)
Granulocyte Absolute Count: 0 10*3/uL (ref 0.00–0.04)
Hematocrit: 28.5 % — ABNORMAL LOW (ref 36.6–50.3)
Hemoglobin: 9 g/dL — ABNORMAL LOW (ref 12.1–17.0)
Immature Granulocytes: 0 % (ref 0.0–0.5)
Lymphocytes %: 9 % — ABNORMAL LOW (ref 12–49)
Lymphocytes Absolute: 0.6 10*3/uL — ABNORMAL LOW (ref 0.8–3.5)
MCH: 27.9 PG (ref 26.0–34.0)
MCHC: 31.6 g/dL (ref 30.0–36.5)
MCV: 88.2 FL (ref 80.0–99.0)
MPV: 9.2 FL (ref 8.9–12.9)
Monocytes %: 10 % (ref 5–13)
Monocytes Absolute: 0.7 10*3/uL (ref 0.0–1.0)
NRBC Absolute: 0 10*3/uL (ref 0.00–0.01)
Neutrophils %: 80 % — ABNORMAL HIGH (ref 32–75)
Neutrophils Absolute: 5.7 10*3/uL (ref 1.8–8.0)
Nucleated RBCs: 0 PER 100 WBC
Platelets: 214 10*3/uL (ref 150–400)
RBC: 3.23 M/uL — ABNORMAL LOW (ref 4.10–5.70)
RDW: 13.5 % (ref 11.5–14.5)
WBC: 7.1 10*3/uL (ref 4.1–11.1)

## 2018-09-08 LAB — TSH 3RD GENERATION
TSH: 7.18 u[IU]/mL — ABNORMAL HIGH (ref 0.36–3.74)
TSH: 7.18 u[IU]/mL — ABNORMAL HIGH (ref 0.36–3.74)

## 2018-09-08 LAB — CBC WITH AUTOMATED DIFF
ABS. BASOPHILS: 0 10*3/uL (ref 0.0–0.1)
ABS. EOSINOPHILS: 0.1 10*3/uL (ref 0.0–0.4)
ABS. IMM. GRANS.: 0 10*3/uL (ref 0.00–0.04)
ABS. LYMPHOCYTES: 0.6 10*3/uL — ABNORMAL LOW (ref 0.8–3.5)
ABS. MONOCYTES: 0.7 10*3/uL (ref 0.0–1.0)
ABS. NEUTROPHILS: 5.7 10*3/uL (ref 1.8–8.0)
ABSOLUTE NRBC: 0 10*3/uL (ref 0.00–0.01)
BASOPHILS: 0 % (ref 0–1)
EOSINOPHILS: 1 % (ref 0–7)
HCT: 28.5 % — ABNORMAL LOW (ref 36.6–50.3)
HGB: 9 g/dL — ABNORMAL LOW (ref 12.1–17.0)
IMMATURE GRANULOCYTES: 0 % (ref 0.0–0.5)
LYMPHOCYTES: 9 % — ABNORMAL LOW (ref 12–49)
MCH: 27.9 PG (ref 26.0–34.0)
MCHC: 31.6 g/dL (ref 30.0–36.5)
MCV: 88.2 FL (ref 80.0–99.0)
MONOCYTES: 10 % (ref 5–13)
MPV: 9.2 FL (ref 8.9–12.9)
NEUTROPHILS: 80 % — ABNORMAL HIGH (ref 32–75)
NRBC: 0 PER 100 WBC
PLATELET: 214 10*3/uL (ref 150–400)
RBC: 3.23 M/uL — ABNORMAL LOW (ref 4.10–5.70)
RDW: 13.5 % (ref 11.5–14.5)
WBC: 7.1 10*3/uL (ref 4.1–11.1)

## 2018-09-08 LAB — METABOLIC PANEL, COMPREHENSIVE
A-G Ratio: 0.5 — ABNORMAL LOW (ref 1.1–2.2)
ALT (SGPT): 12 U/L (ref 12–78)
AST (SGOT): 46 U/L — ABNORMAL HIGH (ref 15–37)
Albumin: 2.7 g/dL — ABNORMAL LOW (ref 3.5–5.0)
Alk. phosphatase: 80 U/L (ref 45–117)
Anion gap: 10 mmol/L (ref 5–15)
BUN/Creatinine ratio: 11 — ABNORMAL LOW (ref 12–20)
BUN: 13 MG/DL (ref 6–20)
Bilirubin, total: 0.5 MG/DL (ref 0.2–1.0)
CO2: 25 mmol/L (ref 21–32)
Calcium: 8.5 MG/DL (ref 8.5–10.1)
Chloride: 98 mmol/L (ref 97–108)
Creatinine: 1.14 MG/DL (ref 0.70–1.30)
GFR est AA: >60 mL/min/{1.73_m2} (ref >60)
GFR est non-AA: >60 mL/min/{1.73_m2} (ref >60)
Globulin: 5.2 g/dL — ABNORMAL HIGH (ref 2.0–4.0)
Glucose: 74 mg/dL (ref 65–100)
Potassium: 4.7 mmol/L (ref 3.5–5.1)
Protein, total: 7.9 g/dL (ref 6.4–8.2)
Sodium: 133 mmol/L — ABNORMAL LOW (ref 136–145)

## 2018-09-08 NOTE — Progress Notes (Signed)
Pre chemo labs drawn for chemo being rescheduled to Friday to transportation issues.

## 2018-09-08 NOTE — Progress Notes (Signed)
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 male with stage IV  prostate and now stage IV NSCLC who comes today for follow-up after hospitalization. He is here today to start [place] for stage IV lung cancer. He has been doing ok at home since discharge. He has no blood in bilateral nephrostomy tubes. On Monday he  passed 2 small blood clots from penis but this has not recurred. He has good urine output from nephrostomy tubes and rarely urinates through penis. Continues to have right chest all pain and also has a blister like rash to the right of his umbilicus which  is painful to him. He is taking percocet as needed which controls pain. His legs continue to be swollen. He is participating in home Bruce Clark and has been spending more time up out of bed recently. He is eating very little but drinks at least 5-6 boost a day  and really enjoys these.       He comes with friend Bruce Clark.         Past Medical History:        Diagnosis  Date         ?  Lung cancer ([place])       ?  Prostate cancer ([place])       ?  Stroke [facility])            Bruce Clark STATES, "I HAD A STROKE A LONG TIME AGO"             Past Surgical History:         Procedure  Laterality  Date          ?  CHEST SURGERY PROCEDURE UNLISTED    04/27/2017          BRONCHOSCOPY/MEDIASTINOSCOPY          ?  HX GI              HERNIA REPAIR          ?  HX ORTHOPAEDIC    1994          ROD IN RIGHT LEG          ?  IR NEPHROSTOMY PERC LT PLC CATH  SI    08/27/2018          ?  IR  NEPHROSTOMY PERC RT PLC CATH  SI    08/27/2018        No Known Allergies        Current Outpatient Medications          Medication  Sig  Dispense  Refill           ?  valACYclovir (VALTREX) 1 gram tablet  Take 1 Tab by mouth three (3) times daily for 7 days.  21 Tab  0           ?  oxyCODONE-acetaminophen (PERCOCET 10) 10-325 mg per tablet  Take 1 Tab by mouth every four (4) hours as needed for Pain for up to 30 days. Max Daily Amount: 6 Tabs.  90 Tab  0     ?  predniSONE (DELTASONE) 5 mg tablet  Take 1 Tab by mouth daily. Restart on Jun 01, 2017  30 Tab  6           ?  abiraterone (ZYTIGA) 500 mg tab  Take 1,000 mg by mouth daily.  60 Tab  3          Social History          Socioeconomic History         ?  Marital status:  SINGLE              Spouse name:  Not on file         ?  Number of children:  Not on file     ?  Years of education:  Not on file     ?  Highest education level:  Not on file       Tobacco Use         ?  Smoking status:  Former Smoker              Packs/day:  0.50         Years:  47.00         Pack years:  23.50         Last attempt to quit:  06/2016         Years since quitting:  2.2         ?  Smokeless tobacco:  Never Used       Substance and Sexual Activity         ?  Alcohol use:  No         ?  Drug use:  No          Family History         Problem  Relation  Age of Onset          ?  Cancer  Mother                COLON          ?  Cancer  Father                BRAIN TUMOR          ?  No Known Problems  Sister       ?  Arthritis-osteo  Brother       ?  No Known Problems  Sister       ?  No Known Problems  Sister       ?  No Known Problems  Sister       ?  No Known Problems  Brother            ?  Anesth Problems  Neg Hx          ROS   A review of systems was obtained and is negative except as listed in HPI.      ECOG PS is 2      Physical Examination:    Visit Vitals  BP  122/84 (BP 1 Location: Left arm)     Pulse  (!) 112     Temp  99.1 ??F (37.3 ??C)     Ht  '5\' 7"'  (1.702 m)     Wt  122 lb  (55.3 kg)     SpO2  97%        BMI  19.11 kg/m??        General appearance - alert, no distress   Mental status - Oriented to person, place, and time   Resp - CTAB, normal respiratory    Cardio - Regular, 3+ LE edema bilaterally    ABD - Soft. Bilateral PCN with clear yellow urine   Extremities -Peripheral pulses normal   Skin-vesicular like rash noted to right of umbilicus       LABS     Lab Results         Component  Value  Date/Time            WBC  7.1  09/08/2018 11:11 AM       HGB  9.0 (L)  09/08/2018 11:11 AM       HCT  28.5 (L)  09/08/2018 11:11 AM       PLATELET  214  09/08/2018 11:11 AM       MCV  88.2  09/08/2018 11:11 AM            ABS. NEUTROPHILS  5.7  09/08/2018 11:11 AM          Lab Results         Component  Value  Date/Time            Sodium  135 (L)  09/03/2018 06:07 AM       Potassium  4.0  09/03/2018 06:07 AM       Chloride  103  09/03/2018 06:07 AM       CO2  25  09/03/2018 06:07 AM       Glucose  95  09/03/2018 06:07 AM       BUN  24 (H)  09/03/2018 06:07 AM       Creatinine  1.00  09/03/2018 06:07 AM       GFR est AA  >60  09/03/2018 06:07 AM       GFR est non-AA  >60  09/03/2018 06:07 AM            Calcium  8.8  09/03/2018 06:07 AM          Lab Results         Component  Value  Date/Time            AST (SGOT)  26  09/02/2018 06:12 AM       Alk. phosphatase  75  09/02/2018 06:12 AM       Protein, total  7.5  09/02/2018 06:12 AM       Albumin  2.6 (L)  09/02/2018 06:12 AM       Globulin  4.9 (H)  09/02/2018 06:12 AM            A-G Ratio  0.5 (L)  09/02/2018 06:12 AM          Recent Labs                 08/26/18   1141  07/05/18   0850  05/11/18   0960  03/24/18   4540  02/17/18   0851  01/01/18   0811  11/27/17  0806     PSA  0.1  0.0*   --   0.1  0.1  0.1  0.1              PSALT   --    --   <0.1   --    --    --    --            IMAGING   PET CT  03/23/17   IMPRESSION: Mass lesion right lower lobe is hypermetabolic and compatible with   the biopsy confirmed the result of adenocarcinoma. There  are soft tissue   densities in the anterior abdominal wall bilaterally which are stable compared   to the prior chest abdomen pelvis CT but new compared to the prior PET/CT.   Please correlate with any recent abdominal wall surgery as these may represent   port sites. Otherwise normal tracer distribution. Stable sclerotic osseous   metastatic disease without abnormal activity.      PATHOLOGY   Supraclavicular node    CYTOLOGIC INTERPRETATION:    Adenocarcinoma, consistent with prostate primary    See comment    General Categorization    Positive for malignancy.    Specimen Adequacy    Satisfactory for evaluation.    Comment    Touch preps and a core biopsy are examined and show islands of adenocarcinoma surrounded by fibrous stroma. A panel of immunohistochemical stains was performed  to evaluate site of origin. The tumor cells are focally positive for PSA and PSAP and negative for CK7, CK20, TTF-1      CT CAP 03/22/18   IMPRESSION   IMPRESSION:    1. Status post right lung surgery with right perihilar consolidation and   scarring consistent with post surgery and radiation therapy change. These   findings are stable.   2. Enlarged prostate.   3. Sclerotic bone metastases unchanged.   4. Atherosclerotic aorta without aneurysm      CT CAP 07/13/18:    IMPRESSION:    1. Increased size of the prostate and increased nodularity involving the bladder   base and bladder wall.   2. New left hydronephrosis and hydroureter with obstruction at the level of the   ureterovesical junction.   3. Status post right lung surgery with elevation of the right hemidiaphragm and   surgical staples and scarring in the right hilum consistent with postsurgical   and postradiation therapy effect, unchanged.   4. Sclerotic bone metastases to the spine unchanged.   5. New low-attenuation liver lesion could represent a metastasis.   6. Dilated fluid-filled esophagus suggesting esophageal dysmotility with   thickened wall possibly secondary to  radiation therapy.   7. Atherosclerotic aorta with coronary artery calcifications. No abdominal   aortic aneurysm.      CT 08/26/2018      IMPRESSION   IMPRESSION:   ??   1. Unchanged appearance of right hemithorax as above.   2. Increased number and size of hypoattenuating lesions of the liver consistent   with worsening hepatic metastatic disease.   3. Bilateral renal pelvocaliectasis and ureteral distention, new on the right   and increased on the left.   4. Foley catheter within urinary bladder which cannot be further assessed.   5. Osteosclerotic metastatic disease.   6. Anasarca appearance demonstrated in the interval.    ??   Pathology from liver biopsy 08/17/18:    Specimen Source    1: Liver, Core biopsy with touch  intepretation:    CYTOLOGIC INTERPRETATION:    1. Liver, Core biopsy with touch intepretation:    Poorly differentiated adenocarcinoma consistent with metastatic pulmonary adenocarcinoma    See comment    General Categorization    Positive for malignancy.          ASSESSMENT   Mr. Dubose is a  68 y.o. male with widely metastatic prostate adenocarcinoma and lung adenocarcinoma status  post right lower lobe lobectomy on 05/22/17. Most recent imaging on 08/26/18 shows progression with new liver mets consistent with metastatic lung adenocarcinoma. Comes today for follow-up and to start [place].       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 He understands this is now stage IV cancer and treatments are palliative in nature.    Today is cycle 1 of palliative Keytruda, teach/consent obtained today   Bruce Clark unable to stay for tx today due to transportation issues so this is re-scheduled to Friday      Prostate Cancer Stage IV   Prostate adenocarcinoma diagnosed on biopsy of the supraclavicular lymph node. Discussed in tumor board and a small cell component has been ruled out.   PSA at the time of diagnosis at 2400.    Currently on Lupron 45 mg every 6 months.    Progressed on Zytiga 1,044m daily as CT scans from 1/9 show bladder masses which suggest progressive prostate cancer and probable castrate resistance   Discontinue Zytiga, ARV7 to be drawn on Friday during Keytruda treatment    [facility]       Acute Renal Failure   Post obstructive   D/t to bilateral renal pelvocaliectasis and ureteral distention, likely due to previously seen progressive prostate cancer possibly infiltrating the bladder   S/p PCN bilaterally    Cr has normalized, monitor closely       Lower Extremity Edema and DVT   Cancer associated   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 Shingles   Given Valtrex TID x 7 days       RTC in 1 week for labs and OV    Seen in conjunction with SMare LoanNP      RJuliene PinaMD, MAmeliaOncology associates

## 2018-09-08 NOTE — Progress Notes (Signed)
 Bruce Clark is a 68 y.o. male  Chief Complaint   Patient presents with   . Follow-up   . Prostate Cancer   1. Have you been to the ER, urgent care clinic since your last visit?  Hospitalized since your last visit? No  2. Have you seen or consulted any other health care providers outside of the [facility] since your last visit?  Include any pap smears or colon screening. No

## 2018-09-08 NOTE — Progress Notes (Signed)
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 male with stage IV prostate and now stage IV NSCLC who comes today for follow-up after hospitalization. He is here today to start [place] for stage IV lung cancer. He has been doing ok at home since discharge. He has no blood in bilateral nephrostomy tubes. On Monday he passed 2 small blood clots from penis but this has not recurred. He has good urine output from nephrostomy tubes and rarely urinates through penis. Continues to have right chest all pain and also has a blister like rash to the right of his umbilicus which is painful to him. He is taking percocet as needed which controls pain. His legs continue to be swollen. He is participating in home PT and has been spending more time up out of bed recently. He is eating very little but drinks at least 5-6 boost a day and really enjoys these.     He comes with friend Bruce Clark.     Past Medical History:   Diagnosis Date   ??? Lung cancer ([place])    ??? Prostate cancer ([place])    ??? Stroke ([place])     PT STATES, "I HAD A STROKE A LONG TIME AGO"       Past Surgical History:   Procedure Laterality Date   ??? CHEST SURGERY PROCEDURE UNLISTED  04/27/2017    BRONCHOSCOPY/MEDIASTINOSCOPY   ??? HX GI      HERNIA REPAIR   ??? HX ORTHOPAEDIC  1994    ROD IN RIGHT LEG   ??? IR NEPHROSTOMY PERC LT PLC CATH  SI  08/27/2018   ??? IR NEPHROSTOMY PERC RT PLC CATH  SI  08/27/2018     No Known Allergies    Current Outpatient Medications   Medication Sig Dispense Refill   ??? valACYclovir (VALTREX) 1 gram tablet Take 1 Tab by mouth three (3) times daily for 7 days. 21 Tab 0   ??? oxyCODONE-acetaminophen (PERCOCET 10) 10-325 mg per tablet Take 1 Tab by mouth every four (4) hours as needed for Pain for up to 30 days. Max Daily  Amount: 6 Tabs. 90 Tab 0   ??? predniSONE (DELTASONE) 5 mg tablet Take 1 Tab by mouth daily. Restart on Jun 01, 2017 30 Tab 6   ??? abiraterone (ZYTIGA) 500 mg tab Take 1,000 mg by mouth daily. 60 Tab 3     Social History     Socioeconomic History   ??? Marital status: SINGLE     Spouse name: Not on file   ??? Number of children: Not on file   ??? Years of education: Not on file   ??? Highest education level: Not on file   Tobacco Use   ??? Smoking status: Former Smoker     Packs/day: 0.50     Years: 47.00     Pack years: 23.50     Last attempt to quit: 06/2016     Years since quitting: 2.2   ??? Smokeless tobacco: Never Used   Substance and Sexual Activity   ??? Alcohol use: No   ??? Drug use: No     Family History   Problem Relation Age of Onset   ??? Cancer Mother  COLON   ??? Cancer Father         BRAIN TUMOR   ??? No Known Problems Sister    ??? Arthritis-osteo Brother    ??? No Known Problems Sister    ??? No Known Problems Sister    ??? No Known Problems Sister    ??? No Known Problems Brother    ??? Anesth Problems Neg Hx      ROS  A review of systems was obtained and is negative except as listed in HPI.    ECOG PS is 2    Physical Examination:   Visit Vitals  BP 122/84 (BP 1 Location: Left arm)   Pulse (!) 112   Temp 99.1 ??F (37.3 ??C)   Ht 5' 7" (1.702 m)   Wt 122 lb (55.3 kg)   SpO2 97%   BMI 19.11 kg/m??     General appearance - alert, no distress  Mental status - Oriented to person, place, and time  Resp - CTAB, normal respiratory   Cardio - Regular, 3+ LE edema bilaterally   ABD - Soft. Bilateral PCN with clear yellow urine  Extremities -Peripheral pulses normal  Skin-vesicular like rash noted to right of umbilicus     LABS  Lab Results   Component Value Date/Time    WBC 7.1 09/08/2018 11:11 AM    HGB 9.0 (L) 09/08/2018 11:11 AM    HCT 28.5 (L) 09/08/2018 11:11 AM    PLATELET 214 09/08/2018 11:11 AM    MCV 88.2 09/08/2018 11:11 AM    ABS. NEUTROPHILS 5.7 09/08/2018 11:11 AM     Lab Results   Component Value Date/Time     Sodium 135 (L) 09/03/2018 06:07 AM    Potassium 4.0 09/03/2018 06:07 AM    Chloride 103 09/03/2018 06:07 AM    CO2 25 09/03/2018 06:07 AM    Glucose 95 09/03/2018 06:07 AM    BUN 24 (H) 09/03/2018 06:07 AM    Creatinine 1.00 09/03/2018 06:07 AM    GFR est AA >60 09/03/2018 06:07 AM    GFR est non-AA >60 09/03/2018 06:07 AM    Calcium 8.8 09/03/2018 06:07 AM     Lab Results   Component Value Date/Time    AST (SGOT) 26 09/02/2018 06:12 AM    Alk. phosphatase 75 09/02/2018 06:12 AM    Protein, total 7.5 09/02/2018 06:12 AM    Albumin 2.6 (L) 09/02/2018 06:12 AM    Globulin 4.9 (H) 09/02/2018 06:12 AM    A-G Ratio 0.5 (L) 09/02/2018 06:12 AM     Recent Labs     08/26/18  1141 07/05/18  0850 05/11/18  0959 03/24/18  0949 02/17/18  0851 01/01/18  0811 11/27/17  0806   PSA 0.1 0.0*  --  0.1 0.1 0.1 0.1   PSALT  --   --  <0.1  --   --   --   --        IMAGING  PET CT  03/23/17  IMPRESSION: Mass lesion right lower lobe is hypermetabolic and compatible with  the biopsy confirmed the result of adenocarcinoma. There are soft tissue  densities in the anterior abdominal wall bilaterally which are stable compared  to the prior chest abdomen pelvis CT but new compared to the prior PET/CT.  Please correlate with any recent abdominal wall surgery as these may represent  port sites. Otherwise normal tracer distribution. Stable sclerotic osseous  metastatic disease without abnormal activity.    PATHOLOGY  Supraclavicular  node   CYTOLOGIC INTERPRETATION:   Adenocarcinoma, consistent with prostate primary   See comment   General Categorization   Positive for malignancy.   Specimen Adequacy   Satisfactory for evaluation.   Comment   Touch preps and a core biopsy are examined and show islands of adenocarcinoma surrounded by fibrous stroma. A panel of immunohistochemical stains was performed to evaluate site of origin. The tumor cells are focally positive for PSA and PSAP and negative for CK7, CK20, TTF-1    CT CAP 03/22/18  IMPRESSION   IMPRESSION:   1. Status post right lung surgery with right perihilar consolidation and  scarring consistent with post surgery and radiation therapy change. These  findings are stable.  2. Enlarged prostate.  3. Sclerotic bone metastases unchanged.  4. Atherosclerotic aorta without aneurysm    CT CAP 07/13/18:   IMPRESSION:   1. Increased size of the prostate and increased nodularity involving the bladder  base and bladder wall.  2. New left hydronephrosis and hydroureter with obstruction at the level of the  ureterovesical junction.  3. Status post right lung surgery with elevation of the right hemidiaphragm and  surgical staples and scarring in the right hilum consistent with postsurgical  and postradiation therapy effect, unchanged.  4. Sclerotic bone metastases to the spine unchanged.  5. New low-attenuation liver lesion could represent a metastasis.  6. Dilated fluid-filled esophagus suggesting esophageal dysmotility with  thickened wall possibly secondary to radiation therapy.  7. Atherosclerotic aorta with coronary artery calcifications. No abdominal  aortic aneurysm.    CT 08/26/2018    IMPRESSION  IMPRESSION:  ??  1. Unchanged appearance of right hemithorax as above.  2. Increased number and size of hypoattenuating lesions of the liver consistent  with worsening hepatic metastatic disease.  3. Bilateral renal pelvocaliectasis and ureteral distention, new on the right  and increased on the left.  4. Foley catheter within urinary bladder which cannot be further assessed.  5. Osteosclerotic metastatic disease.  6. Anasarca appearance demonstrated in the interval.   ??  Pathology from liver biopsy 08/17/18:   Specimen Source   1: Liver, Core biopsy with touch intepretation:   CYTOLOGIC INTERPRETATION:   1. Liver, Core biopsy with touch intepretation:   Poorly differentiated adenocarcinoma consistent with metastatic pulmonary adenocarcinoma   See comment   General Categorization   Positive for malignancy.        ASSESSMENT  Mr. Payne is a 68 y.o. male with widely metastatic prostate adenocarcinoma and lung adenocarcinoma status post right lower lobe lobectomy on 05/22/17. Most recent imaging on 08/26/18 shows progression with new liver mets consistent with metastatic lung adenocarcinoma. Comes today for follow-up and to start [place].     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 He understands this is now stage IV cancer and treatments are palliative in nature.   Today is cycle 1 of palliative Keytruda, teach/consent obtained today  Pt unable to stay for tx today due to transportation issues so this is re-scheduled to Friday    Prostate Cancer Stage IV  Prostate adenocarcinoma diagnosed on biopsy of the supraclavicular lymph node. Discussed in tumor board and a small cell component has been ruled out.  PSA at the time of diagnosis at 2400.  Currently on Lupron 45 mg every 6 months.   Progressed on Zytiga 1,06m daily as CT scans from 1/9 show bladder  masses which suggest progressive prostate cancer and probable castrate resistance  Discontinue Zytiga, ARV7 to be drawn on Friday during Keytruda treatment   Will consider [facility] to [facility] and discuss this at next visit     Acute Renal Failure  Post obstructive  D/t to bilateral renal pelvocaliectasis and ureteral distention, likely due to previously seen progressive prostate cancer possibly infiltrating the bladder  S/p PCN bilaterally   Cr has normalized, monitor closely     Lower Extremity Edema and DVT  Cancer associated  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 was discontinued  Cannot have IVC filter placed as his anatomy is not conducive to this  It does appear that most of the clot is chronic and the anatomy is such that risk for PE is low    Bone Metastases  Diffuse   Completed palliative radiation that was initiated on 06/09/16    Right Sided Chest Wall Pain  Unclear cause  No corresponding mets  Stable on current pain regimen     Anemia   Due to progressive cancer vs hematuria   Hgb improved today, will monitor closely    ? Shingles  Given Valtrex TID x 7 days     RTC in 1 week for labs and OV   Seen in conjunction with Bruce Loan NP    Bruce Pina MD, [facility]

## 2018-09-08 NOTE — Progress Notes (Signed)
Oncology Nurse [facility]   Phone: [phone]    Met with Bruce Clark and caregiver/partner, Bruce Clark, today prior to and during appt with Bruce Loan, NP and Bruce Clark.      They think today some treatment might be started but don't have or understand details.  He is in wheelchair, looks sleepy but responds appropriately and follows conversation.  Endorses pain from right abd rash.  States percocet helps the pain. Denies bleeding.      Since discharge home, Bruce Clark describes efforts to provide Bruce Clark's care and develop/maintain routines at home.  She is an energetic woman and proud of her self care through her own cancer journey ("I am cancer free now") as well as her ability and love of taking care of many family members prior to [place].  Her attitude is upbeat and suggests she is never too overwhelmed by any challenging situation.  She identifies the following challenges:  ? Bruce Clark can move around in the home without her help but holds onto the walls and furniture for support.  Can get to bathroom.  Edematous RLE  is heavy to move.  He has a cane and thinks he needs a walker for home.  She will discuss with Bruce Clark PT -- visit scheduled today.  Denies falls.  States he is out of bed in a recliner more than he's in bed.  ? He's not eating.  Bruce Clark says he wants to eat, but 'when the food gets to my mouth, I can't take it."  Denies nausea.  He has some vomiting earlier today.  He is drinking 4-5 ensures/daily and likes them.  He likes ice cream and some sweets. Drinking lots of water -- aims for 2L/day.    ? [facility] nursing has taught her care for bilat PCN.  Supplies to be delivered to home.  She is emptying and recording output 3-4x/day.  Urine is clear.  No bleeding since Bruce Clark passed several large clots through his penis a couple days ago.   ? She is responsible for after school care for her 66yo autistic Clark and MUST be home each day at 2pm to get him off the bus.   ? Financial barriers:  Live with Bruce Clark and family and all contribute to the household.  Bruce Clark receives $614/month (SS) and puts $300 toward the household.  He has "new" medicare A, B and D -- she is not sure if he is underinsured.  No medical bills now.  No problems getting Rx filled.  ? Transportation barriers:  Neither Optician, dispensing or Bruce Clark drive.  Do not live on [place] line.  Bruce Clark may provide some transport to medical appts if they are scheduled very early.  He also may be able to get them home if Bruce Clark's appointments and infusions are scheduled early and are short.  Today's transportation per [facility] Bruce Clark) -- will call for ride home.      Discussed plan of care with Bruce Clark. Start lung cancer progression with Keytruda.  Plan to discuss treatment for prostate cancer at subsequent visit.  Bruce Clark wants to see him weekly.      Actions/plan:  1.  Unable to stay for Keytruda today due to need to be home for grandchild at 2pm.  Per [facility], Keytruda infusion scheduled at 8am on 1/24.  ? Bruce Clark/Bruce Clark to call me ASAP anytime they have no transportation.  ? They have contact info for [facility] and know how to request rides (need 72 hr notice).  They know FA through [facility] for UZUR[facility] is limited to 10 RTs in a lifetime if volunteer drivers are not available.    2.  Continue HH services/assessment of care needs at home.    3.  Consider applying for BS FA.  They are interested in seeking medicare supplement during open enrollment.     4.  Gave Ensure samples and savings coupons.  Enc small frequent meals and discussed ways to increase nutritional value of Ensure as well foods he might try or can eat.    5.  Clarified/answered questions about "new" Rx -- valtrex and percocet.    6. Enc care for caregiver.  Will discuss supports in [facility] that Bruce Clark can access while she's here for self care.    7.  Schedule all appts early morning to maximize resources for transportation they do have and to get home by 2pm.     8.  AMD on file.  Consider referral to [facility] as needed --- 2 advanced cancers.    Will follow    [name] MS RN AOCNS

## 2018-09-08 NOTE — Progress Notes (Signed)
RICHAD RAMSAY is a 68 y.o. male  Chief Complaint   Patient presents with   ??? Follow-up   ??? Prostate Cancer   1. Have you been to the ER, urgent care clinic since your last visit?  Hospitalized since your last visit? No  2. Have you seen or consulted any other health care providers outside of the [facility] since your last visit?  Include any pap smears or colon screening. No

## 2018-09-08 NOTE — Progress Notes (Signed)
 Oncology Nurse Navigator   [facility]   Phone: [phone]    Met with Daril and caregiver/partner, Merlynn, today prior to and during appt with Lauraine Laughter, NP and Dr. Bill.      They think today some treatment might be started but don't have or understand details.  He is in wheelchair, looks sleepy but responds appropriately and follows conversation.  Endorses pain from right abd rash.  States percocet helps the pain. Denies bleeding.      Since discharge home, Merlynn describes efforts to provide Keon's care and develop/maintain routines at home.  She is an energetic woman and proud of her self care through her own cancer journey (I am cancer free now) as well as her ability and love of taking care of many family members prior to [place].  Her attitude is upbeat and suggests she is never too overwhelmed by any challenging situation.  She identifies the following challenges:  . Daesean can move around in the home without her help but holds onto the walls and furniture for support.  Can get to bathroom.  Edematous RLE  is heavy to move.  He has a cane and thinks he needs a walker for home.  She will discuss with [facility] PT -- visit scheduled today.  Denies falls.  States he is out of bed in a recliner more than he's in bed.  . He's not eating.  Roth says he wants to eat, but 'when the food gets to my mouth, I can't take it.  Denies nausea.  He has some vomiting earlier today.  He is drinking 4-5 ensures/daily and likes them.  He likes ice cream and some sweets. Drinking lots of water -- aims for 2L/day.    [facility] nursing has taught her care for bilat PCN.  Supplies to be delivered to home.  She is emptying and recording output 3-4x/day.  Urine is clear.  No bleeding since Tion passed several large clots through his penis a couple days ago.   . She is responsible for after school care for her 66yo autistic grandson and MUST be home each day at 2pm to get him off the bus.  . Financial barriers:  Live with [name]'s  grandson and family and all contribute to the household.  Devell receives $614/month (SS) and puts $300 toward the household.  He has new medicare A, B and D -- she is not sure if he is underinsured.  No medical bills now.  No problems getting Rx filled.  . Transportation barriers:  Neither Psychologist, occupational or [employer].  Do not live on [place] line.  [name]'s grandson may provide some transport to medical appts if they are scheduled very early.  He also may be able to get them home if Bhavik's appointments and infusions are scheduled early and are short.  Today's transportation per [facility] [place]) -- will call for ride home.      Discussed plan of care with Dr. Bill. Start lung cancer progression with Keytruda.  Plan to discuss treatment for prostate cancer at subsequent visit.  Dr. Bill wants to see him weekly.      Actions/plan:  1.  Unable to stay for Keytruda today due to need to be home for grandchild at 2pm.  Per [facility], Keytruda infusion scheduled at 8am on 1/24.  SABRA Kamerin/[name] to call me ASAP anytime they have no transportation.  . They have contact info for [facility] and know how to request rides (need 72 hr notice).  They know FA through [facility] for [facility] is limited to 10 RTs in a lifetime if volunteer drivers are not available.    2.  Continue HH services/assessment of care needs at home.    3.  Consider applying for BS FA.  They are interested in seeking medicare supplement during open enrollment.     4.  Gave Ensure samples and savings coupons.  Enc small frequent meals and discussed ways to increase nutritional value of Ensure as well foods he might try or can eat.    5.  Clarified/answered questions about new Rx -- valtrex and percocet.    6. Enc care for caregiver.  Will discuss supports in [facility] that Merlynn can access while she's here for self care.    7.  Schedule all appts early morning to maximize resources for transportation they do have and to get home by 2pm.    8.  AMD on file.  Consider referral to [facility]  Med as needed --- 2 advanced cancers.    Will follow    Donny Lanius MS RN AOCNS

## 2018-09-09 NOTE — Telephone Encounter (Signed)
Follow up from Mr Bruce Clark appointment yesterday-referencing painful blisters, and also wanted to discuss orders.  Please call

## 2018-09-09 NOTE — Telephone Encounter (Signed)
Returned call to Afton from [facility]   Provided updates on prescription of VALTREX prescribed to patient on 09/07/2018 and should be on medication for  7 days   Reviewed basic nephrostomy care/orders   Anderson Malta RN verbalized understanding   No new questions or concerns at this time

## 2018-09-10 ENCOUNTER — Telehealth

## 2018-09-10 ENCOUNTER — Inpatient Hospital Stay: Admit: 2018-09-10 | Payer: MEDICARE | Primary: Internal Medicine

## 2018-09-10 DIAGNOSIS — C3431 Malignant neoplasm of lower lobe, right bronchus or lung: Secondary | ICD-10-CM

## 2018-09-10 MED ORDER — SODIUM CHLORIDE 0.9 % IV
INTRAVENOUS | Status: AC
Start: 2018-09-10 — End: 2018-09-10
  Administered 2018-09-10: 15:00:00 via INTRAVENOUS

## 2018-09-10 MED ORDER — PEMBROLIZUMAB 100 MG/4 ML (25 MG/ML) INTRAVENOUS SOLUTION
25 mg/mL | Freq: Once | INTRAVENOUS | Status: AC
Start: 2018-09-10 — End: 2018-09-10
  Administered 2018-09-10: 15:00:00 via INTRAVENOUS

## 2018-09-10 MED FILL — KEYTRUDA 25 MG/ML INTRAVENOUS SOLUTION: 25 mg/mL | INTRAVENOUS | Qty: 8

## 2018-09-10 MED FILL — SODIUM CHLORIDE 0.9 % IV: INTRAVENOUS | Qty: 1000

## 2018-09-10 NOTE — Progress Notes (Signed)
 [facility] Progress Note    Date: September 10, 2018    Name: AXEL FRISK    MRN: [id number]         DOB: [birth year]    Mr. Canny Arrived ambulatory and in no distress for cycle 1 day 1 of Keytruda regimen.  Assessment was completed, no acute issues at this time, no new complaints voiced. Peripheral IV established in the LEFT arm with positive blood return. Labs were drawn previously (1/22) and were within defined limits; medications ordered from pharmacy. Patient and his caregiver were provided education regarding Keytruda. Vertell from pharmacy at bedside to also educate patient regarding new medication.    Chemotherapy Flowsheet 09/10/2018   Cycle C1D1   Date 09/10/2018   Drug / Regimen Keytruda   Dosage 200mg    Time Up 1017   Time Down 1047   Pre Hydration none   Post Hydration none   Pre Meds nono   Notes given         Mr. Ashenfelter's vitals were reviewed.  Patient Vitals for the past 12 hrs:   Temp Pulse Resp BP   09/10/18 1108 99.2 F (37.3 C) 100 18 109/69   09/10/18 0849 98.5 F (36.9 C) (!) 104 20 106/58         Lab results were obtained and reviewed.  No results found for this or any previous visit (from the past 12 hour(s)).    Medications Administered     0.9% sodium chloride infusion     Admin Date  09/10/2018 Action  New Bag Dose  25 mL/hr Rate  25 mL/hr Route  IntraVENous Administered By  Crowley, Jaclyn          pembrolizumab [facility]) 200 mg in 0.9% sodium chloride 100 mL, overfill volume 10 mL IVPB     Admin Date  09/10/2018 Action  New Bag Dose  200 mg Rate  236 mL/hr Route  IntraVENous Administered By  Crowley, Jaclyn                  Mr. Laurel tolerated treatment well. Patient's IV maintained blood return throughout the course of treatment and was removed at the conclusion of therapy. [facility] at 1110.     Future Appointments   Date Time Provider Department Center   09/15/2018  9:30 AM H3 BRE LAB [facility]   09/15/2018  9:45  AM Riki Domino, NP [facility] SCHED   01/07/2019  8:30 AM BREMO INFUSION NURSE 6 [facility]         Darice Pink  September 10, 2018

## 2018-09-10 NOTE — Progress Notes (Signed)
Pharmacy Note- Immunotherapy Education    ANTONIA CULBERTSON is a  68 y.o.male  diagnosed with NSCLC here today for Cycle 1, Day 1 chemotherapy counseling. Mr. Mcglothen is being treated with Pembrolizumab.       Mr. Voit was provided information regarding the risks of immune-mediated adverse reactions secondary to Pembrolizumab that may require interruption/delay of treatment and possible use of corticosteroids.  These reactions include, but are not limited to: colitis (diarrhea or severe abdominal pain); hepatitis (jaundice, severe nausea, or vomiting, easy bruising, and/or bleeding); hypophysitis (persistent or unusual headache, extreme weakness, dizziness/fainting, and/or vision changes); dermatitis (skin rash, mouth sores, skin blisters, and/or skin peeling); ocular toxicity (uveitis, iritis, and/or episcleritis); neuropathy (motor or sensory); hyper/hypothyroidism.    Patient given ways to manage these side effects and when to contact office.     [employer] Handout of medications provided to patient. Mr. Beilke verbalized understanding of the information presented and all of the patient's questions were answered.    Laymond Purser, PharmD, BCPS, BCOP

## 2018-09-10 NOTE — Telephone Encounter (Signed)
Crystal called requesting an order for a rolling walker for patient. Can fax order to [phone]    CB [phone]

## 2018-09-10 NOTE — Progress Notes (Signed)
[facility] Progress Note    Date: September 10, 2018    Name: Bruce Clark    MRN: [id number]         DOB: 1951-05-03    [name] Arrived ambulatory and in no distress for cycle 1 day 1 of Keytruda regimen.  Assessment was completed, no acute issues at this time, no new complaints voiced. Peripheral IV established in the LEFT arm with positive blood return. Labs were drawn previously (1/22) and were within defined limits; medications ordered from pharmacy. Patient and his caregiver were provided education regarding Keytruda. Kathlee Nations from pharmacy at bedside to also educate patient regarding new medication.    Chemotherapy Flowsheet 09/10/2018   Cycle C1D1   Date 09/10/2018   Drug / Regimen Keytruda   Dosage 200mg    Time Up 1017   Time Down 1047   Pre Hydration none   Post Hydration none   Pre Meds nono   Notes given         [name]'s vitals were reviewed.  Patient Vitals for the past 12 hrs:   Temp Pulse Resp BP   09/10/18 1108 99.2 ??F (37.3 ??C) 100 18 109/69   09/10/18 0849 98.5 ??F (36.9 ??C) (!) 104 20 106/58         Lab results were obtained and reviewed.  No results found for this or any previous visit (from the past 12 hour(s)).    Medications Administered     0.9% sodium chloride infusion     Admin Date  09/10/2018 Action  New Bag Dose  25 mL/hr Rate  25 mL/hr Route  IntraVENous Administered By  Adelina Mings          pembrolizumab [facility]) 200 mg in 0.9% sodium chloride 100 mL, overfill volume 10 mL IVPB     Admin Date  09/10/2018 Action  New Bag Dose  200 mg Rate  236 mL/hr Route  IntraVENous Administered By  Adelina Mings                  [name] tolerated treatment well. Patient's IV maintained blood return throughout the course of treatment and was removed at the conclusion of therapy. Patient was discharged from [place] in stable condition at 1110.     Future Appointments   Date Time Provider [place]   09/15/2018  9:30 AM H3 BRE LAB [facility]    09/15/2018  9:45 AM Mare Loan, NP [place]   01/07/2019  8:30 AM BREMO INFUSION NURSE 6 [facility]         Adelina Mings  September 10, 2018

## 2018-09-10 NOTE — Progress Notes (Signed)
Pharmacy Note- Immunotherapy Education    Bruce Clark is a  68 y.o.male  diagnosed with NSCLC here today for Cycle 1, Day 1 chemotherapy counseling. Mr. Helfand is being treated with Pembrolizumab.       Mr. Middlesworth was provided information regarding the risks of immune-mediated adverse reactions secondary to Pembrolizumab that may require interruption/delay of treatment and possible use of corticosteroids.  These reactions include, but are not limited to: colitis (diarrhea or severe abdominal pain); hepatitis (jaundice, severe nausea, or vomiting, easy bruising, and/or bleeding); hypophysitis (persistent or unusual headache, extreme weakness, dizziness/fainting, and/or vision changes); dermatitis (skin rash, mouth sores, skin blisters, and/or skin peeling); ocular toxicity (uveitis, iritis, and/or episcleritis); neuropathy (motor or sensory); hyper/hypothyroidism.    Patient given ways to manage these side effects and when to contact office.     [employer] Handout of medications provided to patient. Mr. Bruntz verbalized understanding of the information presented and all of the patient's questions were answered.    Laymond Purser, PharmD, BCPS, BCOP

## 2018-09-13 ENCOUNTER — Encounter

## 2018-09-13 MED ORDER — WALKER
Freq: Once | 0 refills | Status: DC
Start: 2018-09-13 — End: 2018-09-13

## 2018-09-13 MED ORDER — WALKER
Freq: Once | 0 refills | Status: AC
Start: 2018-09-13 — End: 2018-09-13

## 2018-09-13 NOTE — Telephone Encounter (Signed)
Order placed. Nursing please fax to # provided.

## 2018-09-13 NOTE — Telephone Encounter (Signed)
Called [place] back. States Veron "does not look good." Urine is dark but he is drinking plenty of water. No blood in urine "just dark." She is having trouble gathering her thoughts. I asked if Grahm could come into infusion center today for labs/hydration if needed. She cannot bring him today as they are watching her grandson.     Taletha--he is scheduled Wednesday in [place]. Please re-schedule to tomorrow, earliest appointment available for labs & hydration & OV with me after. Call [name] to confirm time. Cancel Wednesdays appointment.

## 2018-09-13 NOTE — Telephone Encounter (Signed)
Patient friend [name] swinney called and stated she would like a callback to speak with someone regarding mr Fries that it is very important and regarding his health.    [phone]

## 2018-09-14 ENCOUNTER — Inpatient Hospital Stay: Admit: 2018-09-14 | Payer: MEDICARE | Primary: Internal Medicine

## 2018-09-14 ENCOUNTER — Ambulatory Visit: Admit: 2018-09-14 | Discharge: 2018-09-14 | Payer: MEDICARE | Attending: Registered Nurse | Primary: Internal Medicine

## 2018-09-14 ENCOUNTER — Ambulatory Visit: Attending: Registered Nurse | Primary: Internal Medicine

## 2018-09-14 DIAGNOSIS — C61 Malignant neoplasm of prostate: Secondary | ICD-10-CM

## 2018-09-14 LAB — CBC WITH AUTO DIFFERENTIAL
Basophils %: 0 % (ref 0–1)
Basophils Absolute: 0 10*3/uL (ref 0.0–0.1)
Eosinophils %: 1 % (ref 0–7)
Eosinophils Absolute: 0 10*3/uL (ref 0.0–0.4)
Granulocyte Absolute Count: 0 10*3/uL (ref 0.00–0.04)
Hematocrit: 24.5 % — ABNORMAL LOW (ref 36.6–50.3)
Hemoglobin: 8.1 g/dL — ABNORMAL LOW (ref 12.1–17.0)
Immature Granulocytes: 0 % (ref 0.0–0.5)
Lymphocytes %: 16 % (ref 12–49)
Lymphocytes Absolute: 0.7 10*3/uL — ABNORMAL LOW (ref 0.8–3.5)
MCH: 28.1 PG (ref 26.0–34.0)
MCHC: 33.1 g/dL (ref 30.0–36.5)
MCV: 85.1 FL (ref 80.0–99.0)
MPV: 9 FL (ref 8.9–12.9)
Monocytes %: 9 % (ref 5–13)
Monocytes Absolute: 0.4 10*3/uL (ref 0.0–1.0)
NRBC Absolute: 0 10*3/uL (ref 0.00–0.01)
Neutrophils %: 74 % (ref 32–75)
Neutrophils Absolute: 3.1 10*3/uL (ref 1.8–8.0)
Nucleated RBCs: 0 PER 100 WBC
Platelets: 214 10*3/uL (ref 150–400)
RBC: 2.88 M/uL — ABNORMAL LOW (ref 4.10–5.70)
RDW: 13.4 % (ref 11.5–14.5)
WBC: 4.2 10*3/uL (ref 4.1–11.1)

## 2018-09-14 LAB — COMPREHENSIVE METABOLIC PANEL
ALT: 16 U/L (ref 12–78)
AST: 38 U/L — ABNORMAL HIGH (ref 15–37)
Albumin/Globulin Ratio: 0.4 — ABNORMAL LOW (ref 1.1–2.2)
Albumin: 2.5 g/dL — ABNORMAL LOW (ref 3.5–5.0)
Alkaline Phosphatase: 78 U/L (ref 45–117)
Anion Gap: 10 mmol/L (ref 5–15)
BUN: 11 MG/DL (ref 6–20)
Bun/Cre Ratio: 13 (ref 12–20)
CO2: 25 mmol/L (ref 21–32)
Calcium: 8.9 MG/DL (ref 8.5–10.1)
Chloride: 100 mmol/L (ref 97–108)
Creatinine: 0.85 MG/DL (ref 0.70–1.30)
EGFR IF NonAfrican American: >60 mL/min/{1.73_m2} (ref >60)
GFR African American: >60 mL/min/{1.73_m2} (ref >60)
Globulin: 5.6 g/dL — ABNORMAL HIGH (ref 2.0–4.0)
Glucose: 100 mg/dL (ref 65–100)
Potassium: 3.5 mmol/L (ref 3.5–5.1)
Sodium: 135 mmol/L — ABNORMAL LOW (ref 136–145)
Total Bilirubin: 0.3 MG/DL (ref 0.2–1.0)
Total Protein: 8.1 g/dL (ref 6.4–8.2)

## 2018-09-14 LAB — PSA PROSTATIC SPECIFIC ANTIGEN: Pros. Spec. Antigen: 0.1 ng/mL (ref 0.01–4.0)

## 2018-09-14 LAB — CBC WITH AUTOMATED DIFF
ABS. BASOPHILS: 0 10*3/uL (ref 0.0–0.1)
ABS. EOSINOPHILS: 0 10*3/uL (ref 0.0–0.4)
ABS. IMM. GRANS.: 0 10*3/uL (ref 0.00–0.04)
ABS. LYMPHOCYTES: 0.7 10*3/uL — ABNORMAL LOW (ref 0.8–3.5)
ABS. MONOCYTES: 0.4 10*3/uL (ref 0.0–1.0)
ABS. NEUTROPHILS: 3.1 10*3/uL (ref 1.8–8.0)
ABSOLUTE NRBC: 0 10*3/uL (ref 0.00–0.01)
BASOPHILS: 0 % (ref 0–1)
EOSINOPHILS: 1 % (ref 0–7)
HCT: 24.5 % — ABNORMAL LOW (ref 36.6–50.3)
HGB: 8.1 g/dL — ABNORMAL LOW (ref 12.1–17.0)
IMMATURE GRANULOCYTES: 0 % (ref 0.0–0.5)
LYMPHOCYTES: 16 % (ref 12–49)
MCH: 28.1 PG (ref 26.0–34.0)
MCHC: 33.1 g/dL (ref 30.0–36.5)
MCV: 85.1 FL (ref 80.0–99.0)
MONOCYTES: 9 % (ref 5–13)
MPV: 9 FL (ref 8.9–12.9)
NEUTROPHILS: 74 % (ref 32–75)
NRBC: 0 PER 100 WBC
PLATELET: 214 10*3/uL (ref 150–400)
RBC: 2.88 M/uL — ABNORMAL LOW (ref 4.10–5.70)
RDW: 13.4 % (ref 11.5–14.5)
WBC: 4.2 10*3/uL (ref 4.1–11.1)

## 2018-09-14 LAB — METABOLIC PANEL, COMPREHENSIVE
A-G Ratio: 0.4 — ABNORMAL LOW (ref 1.1–2.2)
ALT (SGPT): 16 U/L (ref 12–78)
AST (SGOT): 38 U/L — ABNORMAL HIGH (ref 15–37)
Albumin: 2.5 g/dL — ABNORMAL LOW (ref 3.5–5.0)
Alk. phosphatase: 78 U/L (ref 45–117)
Anion gap: 10 mmol/L (ref 5–15)
BUN/Creatinine ratio: 13 (ref 12–20)
BUN: 11 MG/DL (ref 6–20)
Bilirubin, total: 0.3 MG/DL (ref 0.2–1.0)
CO2: 25 mmol/L (ref 21–32)
Calcium: 8.9 MG/DL (ref 8.5–10.1)
Chloride: 100 mmol/L (ref 97–108)
Creatinine: 0.85 MG/DL (ref 0.70–1.30)
GFR est AA: >60 mL/min/{1.73_m2} (ref >60)
GFR est non-AA: >60 mL/min/{1.73_m2} (ref >60)
Globulin: 5.6 g/dL — ABNORMAL HIGH (ref 2.0–4.0)
Glucose: 100 mg/dL (ref 65–100)
Potassium: 3.5 mmol/L (ref 3.5–5.1)
Protein, total: 8.1 g/dL (ref 6.4–8.2)
Sodium: 135 mmol/L — ABNORMAL LOW (ref 136–145)

## 2018-09-14 LAB — PSA, DIAGNOSTIC (PROSTATE SPECIFIC AG): Prostate Specific Ag: 0.1 ng/mL (ref 0.01–4.0)

## 2018-09-14 MED ORDER — ENOXAPARIN 80 MG/0.8 ML SUB-Q SYRINGE
80 mg/0.8 mL | INJECTION | Freq: Every day | SUBCUTANEOUS | 2 refills | Status: DC
Start: 2018-09-14 — End: 2018-09-14

## 2018-09-14 MED ORDER — ENOXAPARIN 80 MG/0.8 ML SUB-Q SYRINGE
80 mg/0.8 mL | INJECTION | Freq: Every day | SUBCUTANEOUS | 2 refills | Status: AC
Start: 2018-09-14 — End: ?

## 2018-09-14 NOTE — Progress Notes (Signed)
 Bruce Clark is a 68 y.o. male  Chief Complaint   Patient presents with   . Follow-up   . Prostate Cancer   1. Have you been to the ER, urgent care clinic since your last visit?  Hospitalized since your last visit? No  2. Have you seen or consulted any other health care providers outside of the [facility] since your last visit?  Include any pap smears or colon screening. No

## 2018-09-14 NOTE — Progress Notes (Signed)
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 male with stage IV  prostate and stage IV NSCLC . He developed biopsy proven liver metastasis from NSCLC by 07/2018 and progressive prostate cancer with bladder involvement leading to obstructive RF in 08/2018. He was hospitalized 08/2018 secondary to latter and found to have  extensive DVT in the RLE. He could not be anticoagulated due to significant hematuria from bilateral PCNs . IVC filter could not be placed due to his anatomy. He was then started on Keytruda for stage IV NSCLC on 09/08/2018.       He is here today prior to  cycle 2 of Keytruda for stage IV lung cancer. He has no hematuria from the tubes. He has He has been doing ok at home since discharge. He has no blood in bilateral nephrostomy tubes. He has a heavy and swollen R leg which limits  ambulation. Tolerated Keytruda. He has no appetite. He states that his R flank rash is improved.         He comes with friend Bruce Clark.         Past Medical History:        Diagnosis  Date         ?  Lung cancer ([place])       ?  Prostate cancer ([place])       ?  Stroke [facility])            PT STATES, "I HAD A STROKE A LONG TIME AGO"             Past Surgical History:         Procedure  Laterality  Date          ?  CHEST SURGERY PROCEDURE UNLISTED    04/27/2017          BRONCHOSCOPY/MEDIASTINOSCOPY          ?  HX GI               HERNIA REPAIR          ?  HX ORTHOPAEDIC    1994          ROD IN RIGHT LEG          ?  IR NEPHROSTOMY PERC LT PLC CATH  SI    08/27/2018          ?  IR NEPHROSTOMY PERC RT PLC CATH  SI    08/27/2018        No Known  Allergies        Current Outpatient Medications          Medication  Sig  Dispense  Refill           ?  valACYclovir (VALTREX) 1 gram tablet  Take 1 Tab by mouth three (3) times daily for 7 days.  21 Tab  0     ?  oxyCODONE-acetaminophen (PERCOCET 10) 10-325 mg per tablet  Take 1 Tab by mouth every four (4) hours as needed for Pain for up to 30 days. Max Daily Amount: 6 Tabs.  90 Tab  0     ?  predniSONE (DELTASONE) 5 mg tablet  Take 1 Tab by mouth daily. Restart on Jun 01, 2017  30 Tab  6           ?  abiraterone (ZYTIGA) 500 mg tab  Take 1,000 mg by mouth daily.  60 Tab  3          Social History          Socioeconomic History         ?  Marital status:  SINGLE              Spouse name:  Not on file         ?  Number of children:  Not on file     ?  Years of education:  Not on file     ?  Highest education level:  Not on file       Tobacco Use         ?  Smoking status:  Former Smoker              Packs/day:  0.50         Years:  47.00         Pack years:  23.50         Last attempt to quit:  06/2016         Years since quitting:  2.2         ?  Smokeless tobacco:  Never Used       Substance and Sexual Activity         ?  Alcohol use:  No         ?  Drug use:  No          Family History         Problem  Relation  Age of Onset          ?  Cancer  Mother                COLON          ?  Cancer  Father                BRAIN TUMOR          ?  No Known Problems  Sister       ?  Arthritis-osteo  Brother       ?  No Known Problems  Sister       ?  No Known Problems  Sister       ?  No Known Problems  Sister       ?  No Known Problems  Brother            ?  Anesth Problems  Neg Hx          ROS   A review of  systems was obtained and is negative except as listed in HPI.      ECOG PS is 2      Physical  Examination:    Visit Vitals      BP  93/65 (BP 1 Location: Right arm)     Pulse  (!) 102     Temp  98.2 ??F (36.8 ??C)     Ht  '5\' 7"'  (1.702 m)     Wt  122 lb (55.3 kg)     SpO2  91%        BMI  19.11 kg/m??        General appearance - alert, no distress   Mental status - Oriented to person, place, and time   Resp - CTAB, normal respiratory    Cardio - Regular, 3+ LE edema bilaterally    ABD - Soft. Bilateral PCN with clear yellow urine   Extremities -Peripheral pulses normal   Skin-vesicular like rash noted to right of umbilicus . This is no longer erythematous, is scabbing and looks improved      LABS     Lab Results         Component  Value  Date/Time            WBC  7.1  09/08/2018 11:11 AM       HGB  9.0 (L)  09/08/2018 11:11 AM       HCT  28.5 (L)  09/08/2018 11:11 AM       PLATELET  214  09/08/2018 11:11 AM       MCV  88.2  09/08/2018 11:11 AM            ABS. NEUTROPHILS  5.7  09/08/2018 11:11 AM          Lab Results         Component  Value  Date/Time            Sodium  133 (L)  09/08/2018 11:11 AM       Potassium  4.7  09/08/2018 11:11 AM       Chloride  98  09/08/2018 11:11 AM       CO2  25  09/08/2018 11:11 AM       Glucose  74  09/08/2018 11:11 AM       BUN  13  09/08/2018 11:11 AM       Creatinine  1.14  09/08/2018 11:11 AM       GFR est AA  >60  09/08/2018 11:11 AM       GFR est non-AA  >60  09/08/2018 11:11 AM            Calcium  8.5  09/08/2018 11:11 AM          Lab Results         Component  Value  Date/Time            AST (SGOT)  46 (H)  09/08/2018 11:11 AM       Alk. phosphatase  80  09/08/2018 11:11 AM       Protein, total  7.9  09/08/2018 11:11 AM       Albumin  2.7 (L)  09/08/2018 11:11 AM       Globulin  5.2 (H)  09/08/2018 11:11 AM            A-G Ratio  0.5 (L)  09/08/2018 11:11 AM          Recent Labs  08/26/18   1141  07/05/18   0850  05/11/18   0959  03/24/18   0949  02/17/18   0851  01/01/18   0811  11/27/17   0806     PSA  0.1  0.0*   --   0.1  0.1  0.1  0.1               PSALT   --    --   <0.1   --    --    --    --            IMAGING   PET CT  03/23/17   IMPRESSION: Mass lesion right lower lobe is hypermetabolic and compatible with   the biopsy confirmed the result of adenocarcinoma. There are soft tissue   densities in the anterior abdominal wall bilaterally which are stable compared   to the prior chest abdomen pelvis CT but new compared to the prior PET/CT.   Please correlate with any recent abdominal wall surgery as these may represent   port sites. Otherwise normal tracer distribution. Stable sclerotic osseous   metastatic disease without abnormal activity.      PATHOLOGY   Supraclavicular node    CYTOLOGIC INTERPRETATION:    Adenocarcinoma, consistent with prostate primary    See comment    General Categorization    Positive for malignancy.    Specimen Adequacy    Satisfactory for evaluation.    Comment    Touch preps and a core biopsy are examined and show islands of adenocarcinoma surrounded by fibrous stroma. A panel of immunohistochemical stains was performed  to evaluate site of origin. The tumor cells are focally positive for PSA and PSAP and negative for CK7, CK20, TTF-1      CT CAP 03/22/18   IMPRESSION   IMPRESSION:    1. Status post right lung surgery with right perihilar consolidation and   scarring consistent with post surgery and radiation therapy change. These   findings are stable.   2. Enlarged prostate.   3. Sclerotic bone metastases unchanged.   4. Atherosclerotic aorta without aneurysm      CT CAP 07/13/18:    IMPRESSION:    1. Increased size of the prostate and increased nodularity involving the bladder   base and bladder wall.   2. New left hydronephrosis and hydroureter with obstruction at the level of the   ureterovesical junction.   3. Status post right lung surgery with elevation of the right hemidiaphragm and   surgical staples and scarring in the right hilum consistent with postsurgical   and postradiation therapy effect, unchanged.   4. Sclerotic bone  metastases to the spine unchanged.   5. New low-attenuation liver lesion could represent a metastasis.   6. Dilated fluid-filled esophagus suggesting esophageal dysmotility with   thickened wall possibly secondary to radiation therapy.   7. Atherosclerotic aorta with coronary artery calcifications. No abdominal   aortic aneurysm.      CT 08/26/2018      IMPRESSION   IMPRESSION:   ??   1. Unchanged appearance of right hemithorax as above.   2. Increased number and size of hypoattenuating lesions of the liver consistent   with worsening hepatic metastatic disease.   3. Bilateral renal pelvocaliectasis and ureteral distention, new on the right   and increased on the left.   4. Foley catheter within urinary bladder which cannot be further assessed.   5. Osteosclerotic metastatic disease.  6. Anasarca appearance demonstrated in the interval.    ??   Pathology from liver biopsy 08/17/18:    Specimen Source    1: Liver, Core biopsy with touch intepretation:    CYTOLOGIC INTERPRETATION:    1. Liver, Core biopsy with touch intepretation:    Poorly differentiated adenocarcinoma consistent with metastatic pulmonary adenocarcinoma    See comment    General Categorization    Positive for malignancy.          ASSESSMENT   Bruce Clark is a  68 y.o. male with widely metastatic prostate adenocarcinoma and lung adenocarcinoma status  post right lower lobe lobectomy on 05/22/17. Most recent imaging on 08/26/18 shows progression with new liver mets consistent with metastatic lung adenocarcinoma. Comes today for follow-up and to start [place].       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 He understands this is now stage IV cancer and treatments are palliative in nature.    Tolerated cycle 1 of palliative Keytruda   Labs reviewed    Due for cycle 10/01/2018      Prostate Cancer Stage IV   Prostate adenocarcinoma diagnosed on biopsy of the  supraclavicular lymph node. Discussed in tumor board and a small cell component has been ruled out.   PSA at the time of diagnosis at 2400.   Currently on Lupron 45 mg every 6 months.    Progressed on Zytiga 1,093m daily as CT scans from 1/9 show bladder masses which suggest progressive prostate cancer and probable castrate resistance   Discontinue Zytiga, ARV7 to be drawn on Friday during Keytruda treatment    [facility]       If hematuria recurs may consider RT to the prostate and bladder masses      Acute Renal Failure   Post obstructive   D/t to bilateral renal pelvocaliectasis and ureteral distention, likely due to previously seen progressive prostate cancer possibly infiltrating the bladder   S/p PCN bilaterally    Cr has normalized, monitor closely       Lower Extremity Edema and DVT   Cancer associated   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    Had major bleeding on heparin gtt inpatient [facility]   Cannot have IVC filter placed as his anatomy is not conducive to this   It does appear that most of the clot is chronic and the anatomy is such that risk for PE is low      His leg swelling is disabling and his hematuria has resolved and hence [facility].5 [facility]/kg daily      Bone Metastases   Diffuse   Completed palliative radiation that was initiated on 06/09/16   Has not been on Bisphosphonate due to poor oral hygiene      Right Sided Chest Wall Pain   Unclear cause   No corresponding mets   Stable on current pain regimen       Anemia    Due to progressive cancer vs hematuria    Hgb improved      ? Shingles   Given Valtrex TID x 7 days    Note improvement   I will consult ID to ensure inpatient management is not needed  RTC in 1 week for labs and OV          Juliene Pina MD, [facility]

## 2018-09-14 NOTE — Progress Notes (Signed)
[facility] Lab Visit:        0800 Pt arrived ambulatory to [facility] in stable condition, for lab draw. Labs drawn peripherally from Right arm and sent for processing. Tolerating well.         Visit Vitals  BP 96/68 (BP 1 Location: Right arm, BP Patient Position: Sitting)   Pulse (!) 104   Temp 97.3 F (36.3 C)   Resp 18   SpO2 98%          0815 No new concerns voiced. D/c home  ambulatory in no distress. Pt aware next [facility] appointment scheduled.        Labs available in CC once resulted.

## 2018-09-14 NOTE — Progress Notes (Signed)
Bruce Clark is a 68 y.o. male   Chief Complaint   Patient presents with   ??? Follow-up   ??? Prostate Cancer     1. Have you been to the ER, urgent care clinic since your last visit?  Hospitalized since your last visit? No  2. Have you seen or consulted any other health care providers outside of the [place] since your last visit?  Include any pap smears or colon screening.No

## 2018-09-14 NOTE — Progress Notes (Signed)
[facility] Lab Visit:        0800 Pt arrived ambulatory to [facility] in stable condition, for lab draw. Labs drawn peripherally from Right arm and sent for processing. Tolerating well.         Visit Vitals  BP 96/68 (BP 1 Location: Right arm, BP Patient Position: Sitting)   Pulse (!) 104   Temp 97.3 ??F (36.3 ??C)   Resp 18   SpO2 98%          0815 No new concerns voiced. D/c home  ambulatory in no distress. Pt aware next [facility] appointment scheduled.        Labs available in CC once resulted.

## 2018-09-14 NOTE — Progress Notes (Signed)
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 is a 68 y.o. male with stage IV prostate and stage IV NSCLC . He developed biopsy proven liver metastasis from NSCLC by 07/2018 and progressive prostate cancer with bladder involvement leading to obstructive RF in 08/2018. He was hospitalized 08/2018 secondary to latter and found to have extensive DVT in the RLE. He could not be anticoagulated due to significant hematuria from bilateral PCNs . IVC filter could not be placed due to his anatomy. He was then started on Keytruda for stage IV NSCLC on 09/08/2018.     He is here today prior to  cycle 2 of Keytruda for stage IV lung cancer. He has no hematuria from the tubes. He has He has been doing ok at home since discharge. He has no blood in bilateral nephrostomy tubes. He has a heavy and swollen R leg which limits ambulation. Tolerated Keytruda. He has no appetite. He states that his R flank rash is improved.      He comes with friend Bruce Clark.     Past Medical History:   Diagnosis Date   ??? Lung cancer ([place])    ??? Prostate cancer ([place])    ??? Stroke ([place])     PT STATES, "I HAD A STROKE A LONG TIME AGO"       Past Surgical History:   Procedure Laterality Date   ??? CHEST SURGERY PROCEDURE UNLISTED  04/27/2017    BRONCHOSCOPY/MEDIASTINOSCOPY   ??? HX GI      HERNIA REPAIR   ??? HX ORTHOPAEDIC  1994    ROD IN RIGHT LEG   ??? IR NEPHROSTOMY PERC LT PLC CATH  [facility]  08/27/2018   ??? IR NEPHROSTOMY PERC RT PLC CATH  [facility]  08/27/2018     No Known Allergies    Current Outpatient Medications   Medication Sig Dispense Refill   ??? valACYclovir (VALTREX) 1 gram tablet Take 1 Tab by mouth three (3) times daily for 7 days. 21 Tab 0    ??? oxyCODONE-acetaminophen (PERCOCET 10) 10-325 mg per tablet Take 1 Tab by mouth every four (4) hours as needed for Pain for up to 30 days. Max Daily Amount: 6 Tabs. 90 Tab 0   ??? predniSONE (DELTASONE) 5 mg tablet Take 1 Tab by mouth daily. Restart on Jun 01, 2017 30 Tab 6   ??? abiraterone (ZYTIGA) 500 mg tab Take 1,000 mg by mouth daily. 60 Tab 3     Social History     Socioeconomic History   ??? Marital status: SINGLE     Spouse name: Not on file   ??? Number of children: Not on file   ??? Years of education: Not on file   ??? Highest education level: Not on file   Tobacco Use   ??? Smoking status: Former Smoker     Packs/day: 0.50     Years: 47.00     Pack years: 23.50     Last attempt to quit: 06/2016     Years since quitting: 2.2   ??? Smokeless tobacco: Never Used   Substance and Sexual Activity   ???  Alcohol use: No   ??? Drug use: No     Family History   Problem Relation Age of Onset   ??? Cancer Mother         COLON   ??? Cancer Father         BRAIN TUMOR   ??? No Known Problems Sister    ??? Arthritis-osteo Brother    ??? No Known Problems Sister    ??? No Known Problems Sister    ??? No Known Problems Sister    ??? No Known Problems Brother    ??? Anesth Problems Neg Hx      ROS  A review of systems was obtained and is negative except as listed in HPI.    ECOG PS is 2    Physical Examination:   Visit Vitals  BP 93/65 (BP 1 Location: Right arm)   Pulse (!) 102   Temp 98.2 ??F (36.8 ??C)   Ht 5' 7" (1.702 m)   Wt 122 lb (55.3 kg)   SpO2 91%   BMI 19.11 kg/m??     General appearance - alert, no distress  Mental status - Oriented to person, place, and time  Resp - CTAB, normal respiratory   Cardio - Regular, 3+ LE edema bilaterally   ABD - Soft. Bilateral PCN with clear yellow urine  Extremities -Peripheral pulses normal  Skin-vesicular like rash noted to right of umbilicus . This is no longer erythematous, is scabbing and looks improved    LABS  Lab Results   Component Value Date/Time    WBC 7.1 09/08/2018 11:11 AM     HGB 9.0 (L) 09/08/2018 11:11 AM    HCT 28.5 (L) 09/08/2018 11:11 AM    PLATELET 214 09/08/2018 11:11 AM    MCV 88.2 09/08/2018 11:11 AM    ABS. NEUTROPHILS 5.7 09/08/2018 11:11 AM     Lab Results   Component Value Date/Time    Sodium 133 (L) 09/08/2018 11:11 AM    Potassium 4.7 09/08/2018 11:11 AM    Chloride 98 09/08/2018 11:11 AM    CO2 25 09/08/2018 11:11 AM    Glucose 74 09/08/2018 11:11 AM    BUN 13 09/08/2018 11:11 AM    Creatinine 1.14 09/08/2018 11:11 AM    GFR est AA >60 09/08/2018 11:11 AM    GFR est non-AA >60 09/08/2018 11:11 AM    Calcium 8.5 09/08/2018 11:11 AM     Lab Results   Component Value Date/Time    AST (SGOT) 46 (H) 09/08/2018 11:11 AM    Alk. phosphatase 80 09/08/2018 11:11 AM    Protein, total 7.9 09/08/2018 11:11 AM    Albumin 2.7 (L) 09/08/2018 11:11 AM    Globulin 5.2 (H) 09/08/2018 11:11 AM    A-G Ratio 0.5 (L) 09/08/2018 11:11 AM     Recent Labs     08/26/18  1141 07/05/18  0850 05/11/18  0959 03/24/18  0949 02/17/18  0851 01/01/18  0811 11/27/17  0806   PSA 0.1 0.0*  --  0.1 0.1 0.1 0.1   PSALT  --   --  <0.1  --   --   --   --        IMAGING  PET CT  03/23/17  IMPRESSION: Mass lesion right lower lobe is hypermetabolic and compatible with  the biopsy confirmed the result of adenocarcinoma. There are soft tissue  densities in the anterior abdominal wall bilaterally which are stable compared  to the prior chest  abdomen pelvis CT but new compared to the prior PET/CT.  Please correlate with any recent abdominal wall surgery as these may represent  port sites. Otherwise normal tracer distribution. Stable sclerotic osseous  metastatic disease without abnormal activity.    PATHOLOGY  Supraclavicular node   CYTOLOGIC INTERPRETATION:   Adenocarcinoma, consistent with prostate primary   See comment   General Categorization   Positive for malignancy.   Specimen Adequacy   Satisfactory for evaluation.   Comment   Touch preps and a core biopsy are examined and show islands of  adenocarcinoma surrounded by fibrous stroma. A panel of immunohistochemical stains was performed to evaluate site of origin. The tumor cells are focally positive for PSA and PSAP and negative for CK7, CK20, TTF-1    CT CAP 03/22/18  IMPRESSION  IMPRESSION:   1. Status post right lung surgery with right perihilar consolidation and  scarring consistent with post surgery and radiation therapy change. These  findings are stable.  2. Enlarged prostate.  3. Sclerotic bone metastases unchanged.  4. Atherosclerotic aorta without aneurysm    CT CAP 07/13/18:   IMPRESSION:   1. Increased size of the prostate and increased nodularity involving the bladder  base and bladder wall.  2. New left hydronephrosis and hydroureter with obstruction at the level of the  ureterovesical junction.  3. Status post right lung surgery with elevation of the right hemidiaphragm and  surgical staples and scarring in the right hilum consistent with postsurgical  and postradiation therapy effect, unchanged.  4. Sclerotic bone metastases to the spine unchanged.  5. New low-attenuation liver lesion could represent a metastasis.  6. Dilated fluid-filled esophagus suggesting esophageal dysmotility with  thickened wall possibly secondary to radiation therapy.  7. Atherosclerotic aorta with coronary artery calcifications. No abdominal  aortic aneurysm.    CT 08/26/2018    IMPRESSION  IMPRESSION:  ??  1. Unchanged appearance of right hemithorax as above.  2. Increased number and size of hypoattenuating lesions of the liver consistent  with worsening hepatic metastatic disease.  3. Bilateral renal pelvocaliectasis and ureteral distention, new on the right  and increased on the left.  4. Foley catheter within urinary bladder which cannot be further assessed.  5. Osteosclerotic metastatic disease.  6. Anasarca appearance demonstrated in the interval.   ??  Pathology from liver biopsy 08/17/18:   Specimen Source   1: Liver, Core biopsy with touch intepretation:    CYTOLOGIC INTERPRETATION:   1. Liver, Core biopsy with touch intepretation:   Poorly differentiated adenocarcinoma consistent with metastatic pulmonary adenocarcinoma   See comment   General Categorization   Positive for malignancy.       ASSESSMENT  Mr. Howton is a 68 y.o. male with widely metastatic prostate adenocarcinoma and lung adenocarcinoma status post right lower lobe lobectomy on 05/22/17. Most recent imaging on 08/26/18 shows progression with new liver mets consistent with metastatic lung adenocarcinoma. Comes today for follow-up and to start Belva.     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 He understands this is now stage IV cancer and treatments are palliative in nature.   Tolerated cycle 1 of palliative Keytruda  Labs reviewed   Due for cycle 10/01/2018    Prostate Cancer Stage IV  Prostate adenocarcinoma diagnosed on biopsy of the supraclavicular lymph node. Discussed in tumor board and a small cell component has been ruled out.  PSA at the time of diagnosis at 2400.  Currently on Lupron 45 mg every 6 months.   Progressed on Zytiga 1,042m daily as CT scans from 1/9 show bladder masses which suggest progressive prostate cancer and probable castrate resistance  Discontinue Zytiga, ARV7 to be drawn on Friday during Keytruda treatment   [facility] discuss [facility] at next visit     If hematuria recurs may consider RT to the prostate and bladder masses    Acute Renal Failure  Post obstructive  D/t to bilateral renal pelvocaliectasis and ureteral distention, likely due to previously seen progressive prostate cancer possibly infiltrating the bladder  S/p PCN bilaterally   Cr has normalized, monitor closely     Lower Extremity Edema and DVT  Cancer associated  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    Had major bleeding on heparin [facility]tt [facility]  Cannot have IVC filter placed as his anatomy is not conducive to this  It does appear that most of the clot is chronic and the anatomy is such that risk for PE is low    His leg swelling is disabling and his hematuria has resolved and hence [facility]    Bone Metastases  Diffuse  Completed palliative radiation that was initiated on 06/09/16  Has not been on Bisphosphonate due to poor oral hygiene    Right Sided Chest Wall Pain  Unclear cause  No corresponding mets  Stable on current pain regimen     Anemia   Due to progressive cancer vs hematuria   Hgb improved    ? Shingles  Given Valtrex TID x 7 days   Note improvement  I will consult ID to ensure inpatient management is not needed    RTC in 1 week for labs and OV       RJuliene PinaMD, MGayOncology [facility]

## 2018-09-15 ENCOUNTER — Encounter: Attending: Registered Nurse | Primary: Internal Medicine

## 2018-09-15 ENCOUNTER — Inpatient Hospital Stay: Payer: MEDICARE | Primary: Internal Medicine

## 2018-09-17 ENCOUNTER — Encounter

## 2018-09-17 MED ORDER — ENZALUTAMIDE 40 MG CAPSULE
40 mg | ORAL_CAPSULE | Freq: Every day | ORAL | 3 refills | Status: AC
Start: 2018-09-17 — End: ?

## 2018-09-20 NOTE — Telephone Encounter (Signed)
Oncology Nurse [facility]   Phone: [phone]    Call to [name] to check on Abu -- she tells me "I was just going to call you, I have some concerns."    Hazel is not eating -- he denies nausea.  At best he is drinking 2-3 Ensures/day and will drink water if offered to him.      He is constipated -- no BM for 3 days.  She will give him MOM now (what they have on hand) but would "like advice so his bowels won't lock up"    He denies pain.  Not taking percocet.    She noted blood in the right PCN tubing on 2/1.  Tyjon refused to go to ED.  He's had no further hematuria.  Urine is not dark -- "it's lighter yellow now"    She is giving him lovenox, but "he complains about that".  Was taught to do injections by [facility].      He can get OOB and move around better now with a walker.  RLE edema remains unchanged.  He sat outside for a short time yesterday.    She provides his personal care.  Would like a shower chair as he can't stand safely and long enough for a shower. Needs dressing supplies.       Right abd blistering/shingles rash is "drying up" and she washes it gently with water only.  He has 4 valtrex pills left.      She asks -- Does he need to continue to take prednisone and why?    RTC appt 2/5 -- [name] has confirmed transportation through [facility].    She would like Dr. Melony Overly and Judson Roch to know about her concerns before the appt and would appreciate a call from the office.    Plan:  ? Apprise Dr. Melony Overly and team:  o Concern about worsening functional status and caregiver fatigue -- [name] would like a call    o Worsening nutritional intake  o Constipation   o Can benefit from review of medications  o Consider order [facility] personal care for caregiver respite  o Order for DME shower chair  o RTC appt 2/5    ? [facility] to discuss their assessments and confirm supplies are ordered.     ? Consider referral to [facility]     ? Follow up at 2/5 appt on multiple care barriers   o Transportation  o Financial  o ACP   o Caregiver fatigue    [name] MS RN [facility]OC[facility]

## 2018-09-22 ENCOUNTER — Ambulatory Visit: Admit: 2018-09-22 | Discharge: 2018-09-22 | Payer: MEDICARE | Attending: Internal Medicine | Primary: Internal Medicine

## 2018-09-22 ENCOUNTER — Inpatient Hospital Stay: Admit: 2018-09-22 | Payer: MEDICARE | Primary: Internal Medicine

## 2018-09-22 ENCOUNTER — Ambulatory Visit: Attending: Internal Medicine | Primary: Internal Medicine

## 2018-09-22 DIAGNOSIS — D649 Anemia, unspecified: Secondary | ICD-10-CM

## 2018-09-22 DIAGNOSIS — C3431 Malignant neoplasm of lower lobe, right bronchus or lung: Secondary | ICD-10-CM

## 2018-09-22 LAB — CBC WITH AUTO DIFFERENTIAL
Basophils %: 1 % (ref 0–1)
Basophils Absolute: 0.1 10*3/uL (ref 0.0–0.1)
Eosinophils %: 2 % (ref 0–7)
Eosinophils Absolute: 0.1 10*3/uL (ref 0.0–0.4)
Granulocyte Absolute Count: 0 10*3/uL (ref 0.00–0.04)
Hematocrit: 18.5 % — ABNORMAL LOW (ref 36.6–50.3)
Hemoglobin: 5.9 g/dL — CL (ref 12.1–17.0)
Immature Granulocytes: 0 % (ref 0.0–0.5)
Lymphocytes %: 18 % (ref 12–49)
Lymphocytes Absolute: 1.1 10*3/uL (ref 0.8–3.5)
MCH: 28.1 PG (ref 26.0–34.0)
MCHC: 31.9 g/dL (ref 30.0–36.5)
MCV: 88.1 FL (ref 80.0–99.0)
MPV: 9.1 FL (ref 8.9–12.9)
Monocytes %: 10 % (ref 5–13)
Monocytes Absolute: 0.6 10*3/uL (ref 0.0–1.0)
NRBC Absolute: 0 10*3/uL (ref 0.00–0.01)
Neutrophils %: 69 % (ref 32–75)
Neutrophils Absolute: 4.3 10*3/uL (ref 1.8–8.0)
Nucleated RBCs: 0 PER 100 WBC
Platelets: 351 10*3/uL (ref 150–400)
RBC: 2.1 M/uL — ABNORMAL LOW (ref 4.10–5.70)
RDW: 14.1 % (ref 11.5–14.5)
WBC: 6.2 10*3/uL (ref 4.1–11.1)

## 2018-09-22 LAB — COMPREHENSIVE METABOLIC PANEL
ALT: 11 U/L — ABNORMAL LOW (ref 12–78)
AST: 35 U/L (ref 15–37)
Albumin/Globulin Ratio: 0.4 — ABNORMAL LOW (ref 1.1–2.2)
Albumin: 2.4 g/dL — ABNORMAL LOW (ref 3.5–5.0)
Alkaline Phosphatase: 72 U/L (ref 45–117)
Anion Gap: 8 mmol/L (ref 5–15)
BUN: 12 MG/DL (ref 6–20)
Bun/Cre Ratio: 14 (ref 12–20)
CO2: 24 mmol/L (ref 21–32)
Calcium: 9.4 MG/DL (ref 8.5–10.1)
Chloride: 102 mmol/L (ref 97–108)
Creatinine: 0.85 MG/DL (ref 0.70–1.30)
EGFR IF NonAfrican American: >60 mL/min/{1.73_m2} (ref >60)
GFR African American: >60 mL/min/{1.73_m2} (ref >60)
Globulin: 5.4 g/dL — ABNORMAL HIGH (ref 2.0–4.0)
Glucose: 91 mg/dL (ref 65–100)
Potassium: 3.7 mmol/L (ref 3.5–5.1)
Sodium: 134 mmol/L — ABNORMAL LOW (ref 136–145)
Total Bilirubin: 0.3 MG/DL (ref 0.2–1.0)
Total Protein: 7.8 g/dL (ref 6.4–8.2)

## 2018-09-22 LAB — PSA PROSTATIC SPECIFIC ANTIGEN: Pros. Spec. Antigen: 0.1 ng/mL (ref 0.01–4.0)

## 2018-09-22 LAB — CBC WITH AUTOMATED DIFF
ABS. BASOPHILS: 0.1 10*3/uL (ref 0.0–0.1)
ABS. EOSINOPHILS: 0.1 10*3/uL (ref 0.0–0.4)
ABS. IMM. GRANS.: 0 10*3/uL (ref 0.00–0.04)
ABS. LYMPHOCYTES: 1.1 10*3/uL (ref 0.8–3.5)
ABS. MONOCYTES: 0.6 10*3/uL (ref 0.0–1.0)
ABS. NEUTROPHILS: 4.3 10*3/uL (ref 1.8–8.0)
ABSOLUTE NRBC: 0 10*3/uL (ref 0.00–0.01)
BASOPHILS: 1 % (ref 0–1)
EOSINOPHILS: 2 % (ref 0–7)
HCT: 18.5 % — ABNORMAL LOW (ref 36.6–50.3)
HGB: 5.9 g/dL — CL (ref 12.1–17.0)
IMMATURE GRANULOCYTES: 0 % (ref 0.0–0.5)
LYMPHOCYTES: 18 % (ref 12–49)
MCH: 28.1 PG (ref 26.0–34.0)
MCHC: 31.9 g/dL (ref 30.0–36.5)
MCV: 88.1 FL (ref 80.0–99.0)
MONOCYTES: 10 % (ref 5–13)
MPV: 9.1 FL (ref 8.9–12.9)
NEUTROPHILS: 69 % (ref 32–75)
NRBC: 0 PER 100 WBC
PLATELET: 351 10*3/uL (ref 150–400)
RBC: 2.1 M/uL — ABNORMAL LOW (ref 4.10–5.70)
RDW: 14.1 % (ref 11.5–14.5)
WBC: 6.2 10*3/uL (ref 4.1–11.1)

## 2018-09-22 LAB — METABOLIC PANEL, COMPREHENSIVE
A-G Ratio: 0.4 — ABNORMAL LOW (ref 1.1–2.2)
ALT (SGPT): 11 U/L — ABNORMAL LOW (ref 12–78)
AST (SGOT): 35 U/L (ref 15–37)
Albumin: 2.4 g/dL — ABNORMAL LOW (ref 3.5–5.0)
Alk. phosphatase: 72 U/L (ref 45–117)
Anion gap: 8 mmol/L (ref 5–15)
BUN/Creatinine ratio: 14 (ref 12–20)
BUN: 12 MG/DL (ref 6–20)
Bilirubin, total: 0.3 MG/DL (ref 0.2–1.0)
CO2: 24 mmol/L (ref 21–32)
Calcium: 9.4 MG/DL (ref 8.5–10.1)
Chloride: 102 mmol/L (ref 97–108)
Creatinine: 0.85 MG/DL (ref 0.70–1.30)
GFR est AA: >60 mL/min/{1.73_m2} (ref >60)
GFR est non-AA: >60 mL/min/{1.73_m2} (ref >60)
Globulin: 5.4 g/dL — ABNORMAL HIGH (ref 2.0–4.0)
Glucose: 91 mg/dL (ref 65–100)
Potassium: 3.7 mmol/L (ref 3.5–5.1)
Protein, total: 7.8 g/dL (ref 6.4–8.2)
Sodium: 134 mmol/L — ABNORMAL LOW (ref 136–145)

## 2018-09-22 LAB — PSA, DIAGNOSTIC (PROSTATE SPECIFIC AG): Prostate Specific Ag: 0.1 ng/mL (ref 0.01–4.0)

## 2018-09-22 NOTE — Progress Notes (Signed)
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 male with stage IV  prostate and stage IV NSCLC . He developed biopsy proven liver metastasis from NSCLC by 07/2018 and progressive prostate cancer with bladder involvement leading to obstructive RF in 08/2018. He was hospitalized 08/2018 secondary to latter and found to have  extensive DVT in the RLE. He could not be anticoagulated due to significant hematuria from bilateral PCNs . IVC filter could not be placed due to his anatomy. He was then started on Keytruda for stage IV NSCLC on 09/08/2018.       He is here after cycle 1 of Keytruda for stage IV lung cancer.    He does not feel well. He does not have much of an appetite. He had one episode of hematuria and clot through the PCN and none since. Home health asked him to go the emergency room but he declined. He is taking Lovenox shots. He has no other bleeding  and is now having some normal urination through the penis. He has some improvement in leg swelling. He has felt tired. Has 2 more days of Valtrex left and the R flank rash has scabbed.    He comes with friend [name].         Past Medical History:        Diagnosis  Date         ?  Lung cancer ([place])       ?  Prostate cancer ([place])       ?  Stroke [facility])            PT STATES, "I HAD A STROKE A LONG TIME AGO"             Past Surgical History:         Procedure   Laterality  Date          ?  CHEST SURGERY PROCEDURE UNLISTED    04/27/2017          BRONCHOSCOPY/MEDIASTINOSCOPY          ?  HX GI              HERNIA REPAIR          ?  HX ORTHOPAEDIC    1994          ROD IN RIGHT LEG          ?  IR NEPHROSTOMY PERC LT PLC CATH  SI    08/27/2018          ?  IR NEPHROSTOMY PERC RT PLC CATH  SI    08/27/2018        No Known Allergies        Current Outpatient Medications          Medication  Sig  Dispense  Refill           ?  enzalutamide (XTANDI) 40 mg capsule  Take 4 Caps by mouth daily.  120 Cap  3     ?  enoxaparin (LOVENOX) 80 mg/0.8 mL injection  80 mg by SubCUTAneous route daily.  30 Syringe  2     ?  oxyCODONE-acetaminophen (PERCOCET 10) 10-325 mg per tablet  Take 1 Tab by mouth every four (4) hours as needed for Pain for up to 30 days. Max Daily Amount: 6 Tabs.  90 Tab  0           ?  predniSONE (DELTASONE) 5 mg tablet  Take 1 Tab by mouth daily. Restart on Jun 01, 2017  30 Tab  6          Social History          Socioeconomic History         ?  Marital status:  SINGLE              Spouse name:  Not on file         ?  Number of children:  Not on file     ?  Years of education:  Not on file     ?  Highest education level:  Not on file       Tobacco Use         ?  Smoking status:  Former Smoker              Packs/day:  0.50         Years:  47.00         Pack years:  23.50         Last attempt to quit:  06/2016         Years since quitting:  2.2         ?  Smokeless tobacco:  Never Used       Substance and Sexual Activity         ?  Alcohol use:  No         ?  Drug use:  No          Family History         Problem  Relation  Age of Onset          ?  Cancer  Mother                COLON          ?  Cancer  Father                BRAIN TUMOR          ?  No Known Problems  Sister       ?  Arthritis-osteo  Brother       ?  No Known Problems  Sister       ?  No Known Problems  Sister       ?  No Known Problems  Sister       ?  No Known Problems  Brother            ?  Anesth Problems  Neg Hx          ROS   A review of systems was obtained and is negative except as listed in HPI.      ECOG PS is 2      Physical Examination:    Visit Vitals      BP  95/68 (BP 1 Location: Right arm)     Pulse  (!) 103     Temp  97.8 ??F (36.6 ??C)     Ht  '5\' 7"'  (1.702 m)     Wt  122 lb (55.3 kg)        BMI  19.11 kg/m??        General appearance - alert, no distress, in a wheelchair   Mental status - Oriented to person, place, and time, difficult to understand   Resp - CTAB, normal respiratory    Cardio - Regular, 2+ LE edema bilaterally , Left better than right   ABD - Soft. Bilateral PCN with clear yellow urine   Skin-R flank rash is scabbing      LABS     Lab Results         Component  Value  Date/Time            WBC  6.2  09/22/2018 08:44 AM       HGB  5.9 (LL)  09/22/2018 08:44 AM       HCT  18.5 (L)  09/22/2018 08:44 AM       PLATELET  351  09/22/2018 08:44 AM       MCV  88.1  09/22/2018 08:44 AM            ABS. NEUTROPHILS  PENDING  09/22/2018 08:44 AM          Lab Results         Component  Value  Date/Time            Sodium  135 (L)  09/14/2018 08:23 AM       Potassium  3.5  09/14/2018 08:23 AM       Chloride  100  09/14/2018 08:23 AM       CO2  25  09/14/2018 08:23 AM       Glucose  100  09/14/2018 08:23 AM       BUN  11  09/14/2018 08:23 AM       Creatinine  0.85  09/14/2018 08:23 AM       GFR est AA  >60  09/14/2018 08:23 AM       GFR est non-AA  >60  09/14/2018 08:23 AM            Calcium  8.9  09/14/2018 08:23 AM          Lab Results         Component  Value  Date/Time            AST (SGOT)  38 (H)  09/14/2018 08:23 AM       Alk. phosphatase  78  09/14/2018 08:23 AM       Protein, total  8.1  09/14/2018 08:23 AM       Albumin  2.5 (L)  09/14/2018 08:23 AM       Globulin  5.6 (H)  09/14/2018 08:23 AM            A-G Ratio  0.4 (L)  09/14/2018 08:23 AM          Recent Labs  09/22/18   0844  09/14/18   0160  08/26/18   1141  07/05/18   0850  05/11/18   0959  03/24/18   0949  02/17/18   0851   01/01/18   0811  11/27/17   0806     PSA  0.1  0.1  0.1  0.0*   --   0.1  0.1  0.1  0.1                PSALT   --    --    --    --   <0.1   --    --    --    --            IMAGING   PET CT  03/23/17   IMPRESSION: Mass lesion right lower lobe is hypermetabolic and compatible with   the biopsy confirmed the result of adenocarcinoma. There are soft tissue   densities in the anterior abdominal wall bilaterally which are stable compared   to the prior chest abdomen pelvis CT but new compared to the prior PET/CT.   Please correlate with any recent abdominal wall surgery as these may represent   port sites. Otherwise normal tracer distribution. Stable sclerotic osseous   metastatic disease without abnormal activity.      PATHOLOGY   Supraclavicular node    CYTOLOGIC INTERPRETATION:    Adenocarcinoma, consistent with prostate primary    See comment    General Categorization    Positive for malignancy.    Specimen Adequacy    Satisfactory for evaluation.    Comment    Touch preps and a core biopsy are examined and show islands of adenocarcinoma surrounded by fibrous stroma. A panel of immunohistochemical stains was performed  to evaluate site of origin. The tumor cells are focally positive for PSA and PSAP and negative for CK7, CK20, TTF-1      CT CAP 03/22/18   IMPRESSION   IMPRESSION:    1. Status post right lung surgery with right perihilar consolidation and   scarring consistent with post surgery and radiation therapy change. These   findings are stable.   2. Enlarged prostate.   3. Sclerotic bone metastases unchanged.   4. Atherosclerotic aorta without aneurysm      CT CAP 07/13/18:    IMPRESSION:    1. Increased size of the prostate and increased nodularity involving the bladder   base and bladder wall.   2. New left hydronephrosis and hydroureter with obstruction at the level of the   ureterovesical junction.   3. Status post right lung surgery with elevation of the right hemidiaphragm and   surgical staples and scarring  in the right hilum consistent with postsurgical   and postradiation therapy effect, unchanged.   4. Sclerotic bone metastases to the spine unchanged.   5. New low-attenuation liver lesion could represent a metastasis.   6. Dilated fluid-filled esophagus suggesting esophageal dysmotility with   thickened wall possibly secondary to radiation therapy.   7. Atherosclerotic aorta with coronary artery calcifications. No abdominal   aortic aneurysm.      CT 08/26/2018      IMPRESSION   IMPRESSION:   ??   1. Unchanged appearance of right hemithorax as above.   2. Increased number and size of hypoattenuating lesions of the liver consistent   with worsening hepatic metastatic disease.   3. Bilateral renal pelvocaliectasis and ureteral distention, new on the right   and  increased on the left.   4. Foley catheter within urinary bladder which cannot be further assessed.   5. Osteosclerotic metastatic disease.   6. Anasarca appearance demonstrated in the interval.    ??   Pathology from liver biopsy 08/17/18:    Specimen Source    1: Liver, Core biopsy with touch intepretation:    CYTOLOGIC INTERPRETATION:    1. Liver, Core biopsy with touch intepretation:    Poorly differentiated adenocarcinoma consistent with metastatic pulmonary adenocarcinoma    See comment    General Categorization    Positive for malignancy.          ASSESSMENT   Bruce Clark is a  68 y.o. male with widely metastatic prostate adenocarcinoma and lung adenocarcinoma status  post right lower lobe lobectomy on 05/22/17. Most recent imaging on 08/26/18 shows progression with new liver mets consistent with metastatic lung adenocarcinoma. Comes today for follow-up and to start [place].       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 He understands this is now stage IV cancer and treatments are palliative in nature.    Tolerated cycle 1 of palliative Keytruda   Labs reviewed     Due for cycle 10/01/2018      Prostate Cancer Stage IV   Prostate adenocarcinoma diagnosed on biopsy of the supraclavicular lymph node. Discussed in tumor board and a small cell component has been ruled out.   PSA at the time of diagnosis at 2400.   Currently on Lupron 45 mg every 6 months.    Progressed on Zytiga 1,088m daily as CT scans from 1/9 show bladder masses which suggest progressive prostate cancer and probable castrate resistance   Discontinued Zytiga, ARV7 negative       I would suggest palliative Xtandi for stage IV CRPC   There is some preliminary information on safety of combining this with kBeryle Flock  Teach and consent next visit   Plan to start 160 mg daily      If hematuria recurs may consider RT to the prostate and bladder masses   This is intermittent as of now      Acute Renal Failure   Post obstructive   D/t to bilateral renal pelvocaliectasis and ureteral distention, likely due to previously seen progressive prostate cancer possibly infiltrating the bladder   S/p PCN bilaterally    Cr has normalized, monitor closely       Lower Extremity Edema and DVT   Cancer associated   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 He did have one episode of recurrent hematuria on this but no bleeding since . Remains symptomatic from leg swelling hence will continue      Bone Metastases   Diffuse   Completed palliative radiation that was initiated on 06/09/16   Has not been on Bisphosphonate due to poor oral hygiene      Right Sided Chest Wall  Pain   Unclear cause   No corresponding mets   Stable on current pain regimen        Anemia    Due to progressive cancer+  hematuria    Hgb 5.9 g/dl today from recent episode of hematuria      Transfuse 1 unit tomorrow      ? Shingles   Given Valtrex TID x 7 days    Note improvement      Goals of care   Sylvia has very limited understanding of his condition   His POA [name] is doing a great job and understands that he has 2 terminal cancers   She has care giver fatigue   We discussed re evaluating Dante's functional abilities in 2-4 weeks and if he has no QOL will likely consider supportive care only   I will also refer him to palliative care   See Navigator notes for details      RTC in 1 week for labs and OV          Juliene Pina MD, [facility]

## 2018-09-22 NOTE — Progress Notes (Signed)
Patient has critically low hemoglobin of 5.9. Mare Loan, NP made aware. She reports patient will be getting a blood transfusion.

## 2018-09-22 NOTE — Progress Notes (Signed)
 Bruce Clark is a 68 y.o. male  Chief Complaint   Patient presents with   . Follow-up   . Prostate Cancer   1. Have you been to the ER, urgent care clinic since your last visit?  Hospitalized since your last visit? No  2. Have you seen or consulted any other health care providers outside of the [facility] since your last visit?  Include any pap smears or colon screening. No

## 2018-09-22 NOTE — Progress Notes (Signed)
Bruce Clark is a 68 y.o. male  Chief Complaint   Patient presents with   ??? Follow-up   ??? Prostate Cancer   1. Have you been to the ER, urgent care clinic since your last visit?  Hospitalized since your last visit? No    2. Have you seen or consulted any other health care providers outside of the [facility] since your last visit?  Include any pap smears or colon screening.  No

## 2018-09-22 NOTE — Progress Notes (Signed)
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 Ignasiak is a 68 y.o. male with stage IV prostate and stage IV NSCLC . He developed biopsy proven liver metastasis from NSCLC by 07/2018 and progressive prostate cancer with bladder involvement leading to obstructive RF in 08/2018. He was hospitalized 08/2018 secondary to latter and found to have extensive DVT in the RLE. He could not be anticoagulated due to significant hematuria from bilateral PCNs . IVC filter could not be placed due to his anatomy. He was then started on Keytruda for stage IV NSCLC on 09/08/2018.     He is here after cycle 1 of Keytruda for stage IV lung cancer.   He does not feel well. He does not have much of an appetite. He had one episode of hematuria and clot through the PCN and none since. Home health asked him to go the emergency room but he declined. He is taking Lovenox shots. He has no other bleeding and is now having some normal urination through the penis. He has some improvement in leg swelling. He has felt tired. Has 2 more days of Valtrex left and the R flank rash has scabbed.   He comes with friend [name].     Past Medical History:   Diagnosis Date   ??? Lung cancer ([place])    ??? Prostate cancer ([place])    ??? Stroke ([place])     PT STATES, "I HAD A STROKE A LONG TIME AGO"       Past Surgical History:   Procedure Laterality Date   ??? CHEST SURGERY PROCEDURE UNLISTED  04/27/2017    BRONCHOSCOPY/MEDIASTINOSCOPY   ??? HX GI      HERNIA REPAIR   ??? HX ORTHOPAEDIC  1994    ROD IN RIGHT LEG   ??? IR NEPHROSTOMY PERC LT PLC CATH  SI  08/27/2018   ??? IR NEPHROSTOMY PERC RT PLC CATH  SI  08/27/2018     No Known Allergies    Current Outpatient Medications    Medication Sig Dispense Refill   ??? enzalutamide (XTANDI) 40 mg capsule Take 4 Caps by mouth daily. 120 Cap 3   ??? enoxaparin (LOVENOX) 80 mg/0.8 mL injection 80 mg by SubCUTAneous route daily. 30 Syringe 2   ??? oxyCODONE-acetaminophen (PERCOCET 10) 10-325 mg per tablet Take 1 Tab by mouth every four (4) hours as needed for Pain for up to 30 days. Max Daily Amount: 6 Tabs. 90 Tab 0   ??? predniSONE (DELTASONE) 5 mg tablet Take 1 Tab by mouth daily. Restart on Jun 01, 2017 30 Tab 6     Social History     Socioeconomic History   ??? Marital status: SINGLE     Spouse name: Not on file   ??? Number of children: Not on file   ??? Years of education: Not on file   ??? Highest education level: Not on file   Tobacco Use   ??? Smoking status: Former Smoker     Packs/day: 0.50     Years: 47.00     Pack years: 23.50     Last attempt to quit: 06/2016  Years since quitting: 2.2   ??? Smokeless tobacco: Never Used   Substance and Sexual Activity   ??? Alcohol use: No   ??? Drug use: No     Family History   Problem Relation Age of Onset   ??? Cancer Mother         COLON   ??? Cancer Father         BRAIN TUMOR   ??? No Known Problems Sister    ??? Arthritis-osteo Brother    ??? No Known Problems Sister    ??? No Known Problems Sister    ??? No Known Problems Sister    ??? No Known Problems Brother    ??? Anesth Problems Neg Hx      ROS  A review of systems was obtained and is negative except as listed in HPI.    ECOG PS is 2    Physical Examination:   Visit Vitals  BP 95/68 (BP 1 Location: Right arm)   Pulse (!) 103   Temp 97.8 ??F (36.6 ??C)   Ht 5' 7"  (1.702 m)   Wt 122 lb (55.3 kg)   BMI 19.11 kg/m??     General appearance - alert, no distress, in a wheelchair  Mental status - Oriented to person, place, and time, difficult to understand  Resp - CTAB, normal respiratory   Cardio - Regular, 2+ LE edema bilaterally , Left better than right  ABD - Soft. Bilateral PCN with clear yellow urine  Skin-R flank rash is scabbing    LABS  Lab Results   Component Value Date/Time     WBC 6.2 09/22/2018 08:44 AM    HGB 5.9 (LL) 09/22/2018 08:44 AM    HCT 18.5 (L) 09/22/2018 08:44 AM    PLATELET 351 09/22/2018 08:44 AM    MCV 88.1 09/22/2018 08:44 AM    ABS. NEUTROPHILS PENDING 09/22/2018 08:44 AM     Lab Results   Component Value Date/Time    Sodium 135 (L) 09/14/2018 08:23 AM    Potassium 3.5 09/14/2018 08:23 AM    Chloride 100 09/14/2018 08:23 AM    CO2 25 09/14/2018 08:23 AM    Glucose 100 09/14/2018 08:23 AM    BUN 11 09/14/2018 08:23 AM    Creatinine 0.85 09/14/2018 08:23 AM    GFR est AA >60 09/14/2018 08:23 AM    GFR est non-AA >60 09/14/2018 08:23 AM    Calcium 8.9 09/14/2018 08:23 AM     Lab Results   Component Value Date/Time    AST (SGOT) 38 (H) 09/14/2018 08:23 AM    Alk. phosphatase 78 09/14/2018 08:23 AM    Protein, total 8.1 09/14/2018 08:23 AM    Albumin 2.5 (L) 09/14/2018 08:23 AM    Globulin 5.6 (H) 09/14/2018 08:23 AM    A-G Ratio 0.4 (L) 09/14/2018 08:23 AM     Recent Labs     09/22/18  0844 09/14/18  0823 08/26/18  1141 07/05/18  0850 05/11/18  0959 03/24/18  0949 02/17/18  0851 01/01/18  0811 11/27/17  0806   PSA 0.1 0.1 0.1 0.0*  --  0.1 0.1 0.1 0.1   PSALT  --   --   --   --  <0.1  --   --   --   --        IMAGING  PET CT  03/23/17  IMPRESSION: Mass lesion right lower lobe is hypermetabolic and compatible with  the biopsy confirmed the result of adenocarcinoma.  There are soft tissue  densities in the anterior abdominal wall bilaterally which are stable compared  to the prior chest abdomen pelvis CT but new compared to the prior PET/CT.  Please correlate with any recent abdominal wall surgery as these may represent  port sites. Otherwise normal tracer distribution. Stable sclerotic osseous  metastatic disease without abnormal activity.    PATHOLOGY  Supraclavicular node   CYTOLOGIC INTERPRETATION:   Adenocarcinoma, consistent with prostate primary   See comment   General Categorization   Positive for malignancy.   Specimen Adequacy   Satisfactory for evaluation.   Comment    Touch preps and a core biopsy are examined and show islands of adenocarcinoma surrounded by fibrous stroma. A panel of immunohistochemical stains was performed to evaluate site of origin. The tumor cells are focally positive for PSA and PSAP and negative for CK7, CK20, TTF-1    CT CAP 03/22/18  IMPRESSION  IMPRESSION:   1. Status post right lung surgery with right perihilar consolidation and  scarring consistent with post surgery and radiation therapy change. These  findings are stable.  2. Enlarged prostate.  3. Sclerotic bone metastases unchanged.  4. Atherosclerotic aorta without aneurysm    CT CAP 07/13/18:   IMPRESSION:   1. Increased size of the prostate and increased nodularity involving the bladder  base and bladder wall.  2. New left hydronephrosis and hydroureter with obstruction at the level of the  ureterovesical junction.  3. Status post right lung surgery with elevation of the right hemidiaphragm and  surgical staples and scarring in the right hilum consistent with postsurgical  and postradiation therapy effect, unchanged.  4. Sclerotic bone metastases to the spine unchanged.  5. New low-attenuation liver lesion could represent a metastasis.  6. Dilated fluid-filled esophagus suggesting esophageal dysmotility with  thickened wall possibly secondary to radiation therapy.  7. Atherosclerotic aorta with coronary artery calcifications. No abdominal  aortic aneurysm.    CT 08/26/2018    IMPRESSION  IMPRESSION:  ??  1. Unchanged appearance of right hemithorax as above.  2. Increased number and size of hypoattenuating lesions of the liver consistent  with worsening hepatic metastatic disease.  3. Bilateral renal pelvocaliectasis and ureteral distention, new on the right  and increased on the left.  4. Foley catheter within urinary bladder which cannot be further assessed.  5. Osteosclerotic metastatic disease.  6. Anasarca appearance demonstrated in the interval.   ??  Pathology from liver biopsy 08/17/18:    Specimen Source   1: Liver, Core biopsy with touch intepretation:   CYTOLOGIC INTERPRETATION:   1. Liver, Core biopsy with touch intepretation:   Poorly differentiated adenocarcinoma consistent with metastatic pulmonary adenocarcinoma   See comment   General Categorization   Positive for malignancy.       ASSESSMENT  Mr. Ran is a 68 y.o. male with widely metastatic prostate adenocarcinoma and lung adenocarcinoma status post right lower lobe lobectomy on 05/22/17. Most recent imaging on 08/26/18 shows progression with new liver mets consistent with metastatic lung adenocarcinoma. Comes today for follow-up and to start Ruch.     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 He understands this is now stage IV cancer and treatments are palliative in nature.   Tolerated cycle 1 of palliative Keytruda  Labs reviewed   Due for cycle 10/01/2018    Prostate Cancer Stage IV  Prostate adenocarcinoma  diagnosed on biopsy of the supraclavicular lymph node. Discussed in tumor board and a small cell component has been ruled out.  PSA at the time of diagnosis at 2400.  Currently on Lupron 45 mg every 6 months.   Progressed on Zytiga 1,077m daily as CT scans from 1/9 show bladder masses which suggest progressive prostate cancer and probable castrate resistance  Discontinued Zytiga, ARV7 negative     I would suggest palliative Xtandi for stage IV CRPC  There is some preliminary information on safety of combining this with kBeryle Flock Teach and consent next visit  Plan to start 160 mg daily    If hematuria recurs may consider RT to the prostate and bladder masses  This is intermittent as of now    Acute Renal Failure  Post obstructive  D/t to bilateral renal pelvocaliectasis and ureteral distention, likely due to previously seen progressive prostate cancer possibly infiltrating the bladder  S/p PCN bilaterally    Cr has normalized, monitor closely     Lower Extremity Edema and DVT  Cancer associated  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 He did have one episode of recurrent hematuria on this but no bleeding since . Remains symptomatic from leg swelling hence will continue    Bone Metastases  Diffuse  Completed palliative radiation that was initiated on 06/09/16  Has not been on Bisphosphonate due to poor oral hygiene    Right Sided Chest Wall Pain  Unclear cause  No corresponding mets  Stable on current pain regimen     Anemia   Due to progressive cancer+  hematuria   Hgb 5.9 g/dl today from recent episode of hematuria    Transfuse 1 unit tomorrow    ? Shingles  Given Valtrex TID x 7 days   Note improvement    Goals of care  RBellhas very limited understanding of his condition  His POA JBlanch Mediais doing a great job and understands that he has 2 terminal cancers  She has care giver fatigue  We discussed re evaluating Kincaid's functional abilities in 2-4 weeks and if he has no QOL will likely consider supportive care only  I will also refer him to palliative care  See Navigator notes for details    RTC in 1 week for labs and OV       RJuliene PinaMD, MBeyervilleOncology associates

## 2018-09-22 NOTE — Progress Notes (Signed)
[facility] Lab Visit:        0800 Pt arrived ambulatory to [facility] in stable condition, for lab draw. Labs drawn Right arm and sent for processing. Tolerated well.         Visit Vitals  BP (!) 86/54 (BP 1 Location: Right arm, BP Patient Position: Sitting)   Pulse (!) 102   Temp 97.4 ??F (36.3 ??C)   Resp 16   SpO2 94%       0815 No new concerns voiced. D/cd home ambulatory in no distress. Pt aware of next [facility] appointment scheduled.      Labs available in CC once resulted.

## 2018-09-22 NOTE — Progress Notes (Signed)
Oncology Nurse [facility]   Phone: [phone]    Supportive presence with Dhyan and [name] today at appt with Dr. Dian Queen is in wheelchair  --  He's engaged in the appt but appears fatigued. Current concerns are unchanged from those  documented on 2/3:  ? Worsening functional status and caregiver fatigue :  [name] tells me Ra is in bed unless she gets him up.      ? Worsening nutritional intake: drinks 2-3 Ensure/day and water.  Anorexia.  Feels nauseated if he does swallow food.    ? Constipation was resolved with MOM -- incontinent of large stool yesterday.  ? Urine is light yellow in bilat PCN drainage bags.  Twice in the past week he's unexpectedly voided light urine through his penis.  Denies hematuria since 2/1.   ? Right leg is grossly edematous, heavy.  He ambulates with a walker at home.  ? Denies pain.  Not taking percocet  ? Herpetic rash (right abd) is dried and scabbed.   ? Not sleeping well     Dr. Melony Overly reviewed labs --    Today's hgb is 5.9 -- will schedule TFN 1 unit PRBC in [facility] tomorrow @ 8am. Unclear but hope this will help him feel better.  Reviewed his condition.  Fausto was upset to realize he likely cannot 'get rid of the drains' and that his leg edema will not get better.  It's unclear how to get him feeling better.      Difficult and open ended discussion about goals of care.  Sueo is aware he has two "stage 4" cancers.  He knows they cannot be cured and he knows he's too ill right now to receive cancer treatment.  He would like to try to see if he feels better in 2 weeks and revisit the conversation about palliative cancer treatment versus best supportive care.      Pall Med consultation and hospice care and focus soley on QOL were described as two helps to consider.  He became tearful about hospice care -- reflective about the death of his dear friend, Leane Para, who was in home hospice care.  He stated several times that his tears were about his  grief/loss not about the idea of hospice.      They are open to referral to [facility] but do not like the idea of more appts and think they would like to wait to decide until next appt with Dr. Melony Overly in 2 weeks.  If [place] is not feeling better, they want to discuss care goals and best supportive care.    [name] tells me privately that Ryett's care is "getting harder."  She recalls her experience caring for her loved ones in hospice and doing the hard work of caregiver.  She senses he may not live longer than weeks.  Meeting Ryland's physical needs is getting harder, esp when he is incontinent.  She cannot safely help him shower.      She and Lee live with [name]'s grandson and family/2 greatgrandkids, aged 20 and 32.  66 year old has autism and special needs and [name] is caregiver for him.  Both greatgrandkids [employer].  [name]'s grandson has verbalized his wish/fear that Salomon not die in the house.      She volunteers some EOL planning.  Avram has directed her to "not spend a lot of money on me."  He has a friend in [place] who is  an undertaker who will be called after Letrell's death to transport the body to [place] for cremation.  No fee for service.      [name] is tearful but composes quickly to move on to the next task.  She is a cancer survivor herself and is hardy but is identifying her limits as caregiver.      Plan:  ? We agreed to stay in touch closely in next 2 weeks.   ? Will see them tomorrow in [facility] and confirm or change goals.  ? Hospice info session as they will allow/tolerate  ? Pall Med referral as they will allow/tolerate  ? Anticipatory grief support  ?? [place] to discuss their assessments   ? Transportation today by Nutritional therapist.  [name] will call to request transportation to [facility] tomorrow.      [name] MS RN AOCNS

## 2018-09-22 NOTE — Progress Notes (Signed)
 Oncology Nurse Navigator   [facility]   Phone: [phone]    Supportive presence with Casson and Merlynn today at appt with Dr. Bill Daril is in wheelchair  --  He's engaged in the appt but appears fatigued. Current concerns are unchanged from those  documented on 2/3:  ? Worsening functional status and caregiver fatigue :  Merlynn tells me Pamela is in bed unless she gets him up.      ? Worsening nutritional intake: drinks 2-3 Ensure/day and water.  Anorexia.  Feels nauseated if he does swallow food.    ? Constipation was resolved with MOM -- incontinent of large stool yesterday.  ? Urine is light yellow in bilat PCN drainage bags.  Twice in the past week he's unexpectedly voided light urine through his penis.  Denies hematuria since 2/1.   ? Right leg is grossly edematous, heavy.  He ambulates with a walker at home.  ? Denies pain.  Not taking percocet  ? Herpetic rash (right abd) is dried and scabbed.   ? Not sleeping well     Dr. Bill reviewed labs --    Today's hgb is 5.9 -- will schedule TFN 1 unit PRBC in [facility] tomorrow @ 8am. Unclear but hope this will help him feel better.  Reviewed his condition.  Yazir was upset to realize he likely cannot 'get rid of the drains' and that his leg edema will not get better.  It's unclear how to get him feeling better.      Difficult and open ended discussion about goals of care.  Ridhaan is aware he has two stage 4 cancers.  He knows they cannot be cured and he knows he's too ill right now to receive cancer treatment.  He would like to try to see if he feels better in 2 weeks and revisit the conversation about palliative cancer treatment versus best supportive care.      Pall Med consultation and hospice care and focus soley on QOL were described as two helps to consider.  He became tearful about hospice care -- reflective about the death of his dear friend, Keven, who was in home hospice care.  He stated several times that his tears were about his grief/loss not  about the idea of hospice.      They are open to referral to [facility] but do not like the idea of more appts and think they would like to wait to decide until next appt with Dr. Bill in 2 weeks.  If [place] is not feeling better, they want to discuss care goals and best supportive care.    Merlynn tells me privately that Judson's care is getting harder.  She recalls her experience caring for her loved ones in hospice and doing the hard work of caregiver.  She senses he may not live longer than weeks.  Meeting Everson's physical needs is getting harder, esp when he is incontinent.  She cannot safely help him shower.      She and Dmarius live with [name]'s grandson and family/2 greatgrandkids, aged 2 and 68.  66 year old has autism and special needs and Merlynn is caregiver for him.  Both greatgrandkids [employer].  [name]'s grandson has verbalized his wish/fear that Kin not die in the house.      She volunteers some EOL planning.  Jarquis has directed her to not spend a lot of money on me.  He has a friend in [place] who is  an undertaker who will be called after Henri's death to transport the body to [place] for cremation.  No fee for service.      Merlynn is tearful but composes quickly to move on to the next task.  She is a cancer survivor herself and is hardy but is identifying her limits as caregiver.      Plan:  . We agreed to stay in touch closely in next 2 weeks.   . Will see them tomorrow in [facility] and confirm or change goals.  [facility] Hospice info session as they will allow/tolerate  . Pall Med referral as they will allow/tolerate  . Anticipatory grief support   Contact [facility] to discuss their assessments   . Transportation today by Armed forces operational officer.  Merlynn will call to request transportation to [facility] tomorrow.      Donny Lanius MS RN AOCNS

## 2018-09-22 NOTE — Progress Notes (Signed)
 OPIC Lab Visit:        0800 Pt arrived ambulatory to [facility] in stable condition, for lab draw. Labs drawn Right arm and sent for processing. Tolerated well.         Visit Vitals  BP (!) 86/54 (BP 1 Location: Right arm, BP Patient Position: Sitting)   Pulse (!) 102   Temp 97.4 F (36.3 C)   Resp 16   SpO2 94%       0815 No new concerns voiced. D/cd home ambulatory in no distress. Pt aware of next [facility] appointment scheduled.      Labs available in CC once resulted.

## 2018-09-23 ENCOUNTER — Inpatient Hospital Stay: Admit: 2018-09-23 | Payer: MEDICARE | Primary: Internal Medicine

## 2018-09-23 ENCOUNTER — Encounter

## 2018-09-23 MED ORDER — SODIUM CHLORIDE 0.9 % IJ SYRG
INTRAMUSCULAR | Status: DC | PRN
Start: 2018-09-23 — End: 2018-09-24
  Administered 2018-09-23 (×2): via INTRAVENOUS

## 2018-09-23 MED ORDER — SODIUM CHLORIDE 0.9 % IV
INTRAVENOUS | Status: DC | PRN
Start: 2018-09-23 — End: 2018-09-24
  Administered 2018-09-23: 14:00:00 via INTRAVENOUS

## 2018-09-23 MED FILL — BD POSIFLUSH NORMAL SALINE 0.9 % INJECTION SYRINGE: INTRAMUSCULAR | Qty: 10

## 2018-09-23 MED FILL — SODIUM CHLORIDE 0.9 % IV: INTRAVENOUS | Qty: 250

## 2018-09-23 NOTE — Progress Notes (Signed)
 [facility] Progress Note    Date: September 23, 2018    Name: Bruce Clark    MRN: [id number]         DOB: 05/07/1951      0810 Pt arrived at [facility] via wheelchair and in no distress for transfusion of 1 unit PRBCs. Assessment completed. Pt continues to feel weak. Unable to move right lower extremity due to edema. No appetite. IV established in left upper arm without difficulty. Signs/symptoms of adverse blood reaction discussed with pt, voiced understanding. Line attached to NS at [facility]    Medications received:  NS @ [facility]    0925:  1st unit PRBCs started and infusing without difficulty, observed x 15 minutes  1134:  1st unit completed without adverse reaction noted, NS flushing line.    Patient Vitals for the past 12 hrs:   Temp Pulse Resp BP   09/23/18 1214 99.6 F (37.6 C) 91 18 114/76   09/23/18 1125 98.9 F (37.2 C) 90 16 118/75   09/23/18 1025 98 F (36.7 C) 96 16 113/77   09/23/18 0955 98 F (36.7 C) 99 16 111/76   09/23/18 0940 98 F (36.7 C) 96 16 111/75   09/23/18 0919 98.7 F (37.1 C) 98 16 (!) 89/63   09/23/18 0841 97.7 F (36.5 C) 92 16 95/61       1220 Tolerated transfusion well, no adverse reaction noted. Pt observed for 1 HR post transfusion. D/Cd from [facility] via wheelchair and in no distress accompanied by caretaker.      Future Appointments   Date Time Provider Department Center   10/01/2018  8:00 AM G2 BRE [facility]   10/12/2018  9:45 AM Riki Domino, NP [facility] SCHED   10/22/2018  8:00 AM G2 BRE [facility]   11/12/2018  8:00 AM G2 BRE [facility]   01/07/2019  8:30 AM BREMO INFUSION NURSE 6 [facility]         Clementine LOISE Mas, RN  September 23, 2018

## 2018-09-23 NOTE — Progress Notes (Signed)
[facility] Progress Note    Date: September 23, 2018    Name: Bruce Clark    MRN: [id number]         DOB: [birth year]      0810 Pt arrived at [facility] via wheelchair and in no distress for transfusion of 1 unit PRBCs. Assessment completed. Pt continues to feel weak. Unable to move right lower extremity due to edema. No appetite. IV established in left upper arm without difficulty. Signs/symptoms of adverse blood reaction discussed with pt, voiced understanding. Line attached to NS at [facility]    Medications received:  NS @ [facility]    0925:  1st unit PRBCs started and infusing without difficulty, observed x 15 minutes  1134:  1st unit completed without adverse reaction noted, NS flushing line.    Patient Vitals for the past 12 hrs:   Temp Pulse Resp BP   09/23/18 1214 99.6 ??F (37.6 ??C) 91 18 114/76   09/23/18 1125 98.9 ??F (37.2 ??C) 90 16 118/75   09/23/18 1025 98 ??F (36.7 ??C) 96 16 113/77   09/23/18 0955 98 ??F (36.7 ??C) 99 16 111/76   09/23/18 0940 98 ??F (36.7 ??C) 96 16 111/75   09/23/18 0919 98.7 ??F (37.1 ??C) 98 16 (!) 89/63   09/23/18 0841 97.7 ??F (36.5 ??C) 92 16 95/61       1220 Tolerated transfusion well, no adverse reaction noted. Pt observed for 1 HR post transfusion. D/Cd from [facility] via wheelchair and in no distress accompanied by caretaker.      Future Appointments   Date Time Provider [place]   10/01/2018  8:00 AM G2 BRE [place] [facility]   10/12/2018  9:45 AM Mare Loan, NP [place]   10/22/2018  8:00 AM G2 BRE [facility]   11/12/2018  8:00 AM G2 BRE [facility]   01/07/2019  8:30 AM BREMO INFUSION NURSE 6 [facility], RN  September 23, 2018

## 2018-09-23 NOTE — Progress Notes (Signed)
Oncology Nurse [facility]   Phone: [phone]    Met with [name] today in [facility]    She says Daundre has been talking about dying for several days and especially last night -- he tells her he is not afraid of going home to God.  He was irritable with her and didn't seem to want to come to [facility] today for TFN.  She sees that he is "wasting away" and fears he may die soon, "within weeks."    She is reflective about her past experiences with death and hospice.    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  She has comfort from these memories.      She would like to meet with [facility] for information about their services and admission -- [facility] is providing services to [place] in the home now.  [name] and I visited [place] in [place] -- he is sleepy and receiving TFN, but is agreeable to visit from hospice.      I've contacted Crystal at [facility] @ [phone].  Per Crystal, [facility] will  contact [name] this afternoon to set up time to meet with Kahle and family.      Will apprise Dr. Melony Overly and her office staff of [name]'s decision and request fax hospice eval and treatment order to [facility] at [phone].      [name] is aware that I am out of hospital this afternoon and will return to work at [facility] [employer] [facility] [employer] on 2/11.  I will contact her then.      [name] MS RN AOCNS

## 2018-09-23 NOTE — Progress Notes (Signed)
 Oncology Nurse Navigator   [facility]   Phone: [phone]    Met with Merlynn today in [facility]    She says Angelino has been talking about dying for several days and especially last night -- he tells her he is not afraid of going home to God.  He was irritable with her and didn't seem to want to come to [facility] today for TFN.  She sees that he is wasting away and fears he may die soon, within weeks.    She is reflective about her past experiences with death and hospice.    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  She has comfort from these memories.      She would like to meet with [facility] for information about their services and admission -- [facility] is providing services to [place] in the home now.  Merlynn and I visited [place] in [place] -- he is sleepy and receiving TFN, but is agreeable to visit from hospice.      I've contacted Crystal at [facility] @ [phone].  Per Crystal, [facility] will  contact Merlynn this afternoon to set up time to meet with Jermale and family.      Will apprise Dr. Bill and her office staff of [name]'s decision and request fax hospice eval and treatment order to [facility] at [phone].      Merlynn is aware that I am out of hospital this afternoon and will return to work at [facility] on 2/11.  I will contact her then.      Donny Lanius MS RN AOCNS

## 2018-09-24 LAB — TYPE AND SCREEN
ABO/Rh: A POS
Antibody Screen: NEGATIVE
Status: TRANSFUSED
Unit Divison: 0

## 2018-09-24 LAB — TYPE & SCREEN
ABO/Rh(D): A POS
Antibody screen: NEGATIVE
Status of unit: TRANSFUSED
Unit division: 0

## 2018-09-24 NOTE — Telephone Encounter (Signed)
[name] called and stated that patient is now enrolled with hospice, [facility].

## 2018-09-24 NOTE — Telephone Encounter (Signed)
Please cancel all OPIC/OV apts

## 2018-09-29 NOTE — Telephone Encounter (Signed)
Oncology Nurse [facility]   Phone: [phone]    Call to Bruce Clark has been admitted to [facility].  She has met with nursing, SW and aides  Aides come 2x/week to help with Standley's personal care.  So far, Bruce Clark has refused hospital bed and home O2.    He's awake and can interact a bit with the children in the home  Yesterday he ate some applesauce and eggs but "he has lost even more weight and really is just skin and bones."   Her grandson and his wife are available to help her care for Bruce Clark but she says she hasn't needed their help yet.  She sleeps in the same room with Bruce Clark to anticipate his needs.  She feels he's comfortable.  Hospice helped [facility] with constipation.    She will call me with updates and welcomes my call -- I'll plan to check in ongoing.  Encouraged care for caregiver.      [name] MS RN AOCNS

## 2018-10-01 ENCOUNTER — Inpatient Hospital Stay: Payer: MEDICARE | Primary: Internal Medicine

## 2018-10-08 NOTE — Telephone Encounter (Signed)
Patients friend Fayrene Helper, called and stated she would like a call back from the nurse. CB# [phone]

## 2018-10-08 NOTE — Telephone Encounter (Signed)
[facility] left a voicemail and stated they were returning a call from the nurse. CB# [phone]. Fax [phone]

## 2018-10-08 NOTE — Telephone Encounter (Signed)
Returned call and spoke with Bruce Clark   PHI confirmed x2 identifiers   Bruce Clark stated patient is being sent to a facility that has a bed available for next week   Social Work had contacted her and stated medical records are needed  Number to call was [phone]     Called and LVM for Social to contact office to review what is needed

## 2018-10-12 ENCOUNTER — Encounter: Attending: Registered Nurse | Primary: Internal Medicine

## 2018-10-13 NOTE — Telephone Encounter (Signed)
Oncology Nurse [facility]   Phone: [phone]    [place] faciliated through hospice is for [name]'s placement in nursing facility -- Waiting on id of bed  [name] understands hospice SW has contacted Dr. Rosina Lowenstein office has been contacted by hospice to provide medical records needed    She is fatigued, recovering from URI.  Finds little time for self care and states feeling comfortable with plan for [name]  She says he is aware of this plan    Will follow ongoing.  Assured [name] of continuing concern here for her and [name]; enc her to call as needed    [name] MS RN AOCNS    Family is well

## 2018-10-19 NOTE — Telephone Encounter (Signed)
Pt has passed away. Per [facility].

## 2018-10-20 NOTE — Telephone Encounter (Signed)
Oncology Nurse [facility]   Phone: [phone]    Condolence call to [name] -- "Sheikh passed in his sleep yesterday at the nursing home"    She leaves tonight and is making arrangements for funeral in [place] for 3/6 at 2pm.  Some arrangements had been pre-planned and supported by Elizar's and [name]'s network of friends in [place].      She says her immediate family "is good."  She wants to start taking care of herself more now and verbalizes feeling at peace about Kedarius.     She thanked me for my call    [name] MS RN AOCNS

## 2018-10-22 ENCOUNTER — Encounter: Payer: MEDICARE | Primary: Internal Medicine

## 2018-11-12 ENCOUNTER — Encounter: Payer: MEDICARE | Primary: Internal Medicine

## 2018-11-17 DEATH — deceased

## 2019-01-07 ENCOUNTER — Encounter: Payer: MEDICARE | Primary: Internal Medicine
